# Patient Record
Sex: Female | Born: 1950 | Race: Black or African American | Hispanic: No | Smoking: Former smoker
Health system: Southern US, Community
[De-identification: ages and names within clinical notes are randomized; demographics above are authoritative.]

## PROBLEM LIST (undated history)

## (undated) DIAGNOSIS — D696 Thrombocytopenia, unspecified: Secondary | ICD-10-CM

## (undated) DIAGNOSIS — R011 Cardiac murmur, unspecified: Secondary | ICD-10-CM

## (undated) DIAGNOSIS — C189 Malignant neoplasm of colon, unspecified: Secondary | ICD-10-CM

## (undated) DIAGNOSIS — I313 Pericardial effusion (noninflammatory): Secondary | ICD-10-CM

## (undated) DIAGNOSIS — K219 Gastro-esophageal reflux disease without esophagitis: Secondary | ICD-10-CM

## (undated) DIAGNOSIS — Z923 Personal history of irradiation: Secondary | ICD-10-CM

## (undated) DIAGNOSIS — R51 Headache: Secondary | ICD-10-CM

## (undated) DIAGNOSIS — Z86010 Personal history of colonic polyps: Secondary | ICD-10-CM

## (undated) DIAGNOSIS — I3139 Other pericardial effusion (noninflammatory): Secondary | ICD-10-CM

## (undated) DIAGNOSIS — C719 Malignant neoplasm of brain, unspecified: Secondary | ICD-10-CM

## (undated) DIAGNOSIS — K5790 Diverticulosis of intestine, part unspecified, without perforation or abscess without bleeding: Secondary | ICD-10-CM

## (undated) DIAGNOSIS — Z9221 Personal history of antineoplastic chemotherapy: Secondary | ICD-10-CM

## (undated) DIAGNOSIS — Z5189 Encounter for other specified aftercare: Secondary | ICD-10-CM

## (undated) DIAGNOSIS — R0602 Shortness of breath: Secondary | ICD-10-CM

## (undated) DIAGNOSIS — G709 Myoneural disorder, unspecified: Secondary | ICD-10-CM

## (undated) DIAGNOSIS — IMO0001 Reserved for inherently not codable concepts without codable children: Secondary | ICD-10-CM

## (undated) DIAGNOSIS — I517 Cardiomegaly: Secondary | ICD-10-CM

## (undated) DIAGNOSIS — I739 Peripheral vascular disease, unspecified: Secondary | ICD-10-CM

## (undated) DIAGNOSIS — Z803 Family history of malignant neoplasm of breast: Secondary | ICD-10-CM

## (undated) DIAGNOSIS — I10 Essential (primary) hypertension: Secondary | ICD-10-CM

## (undated) DIAGNOSIS — M1990 Unspecified osteoarthritis, unspecified site: Secondary | ICD-10-CM

## (undated) DIAGNOSIS — T7840XA Allergy, unspecified, initial encounter: Secondary | ICD-10-CM

## (undated) DIAGNOSIS — Z800 Family history of malignant neoplasm of digestive organs: Secondary | ICD-10-CM

## (undated) HISTORY — DX: Diverticulosis of intestine, part unspecified, without perforation or abscess without bleeding: K57.90

## (undated) HISTORY — DX: Malignant neoplasm of brain, unspecified: C71.9

## (undated) HISTORY — DX: Allergy, unspecified, initial encounter: T78.40XA

## (undated) HISTORY — PX: TONSILLECTOMY: SUR1361

## (undated) HISTORY — DX: Family history of malignant neoplasm of breast: Z80.3

## (undated) HISTORY — DX: Myoneural disorder, unspecified: G70.9

## (undated) HISTORY — DX: Family history of malignant neoplasm of digestive organs: Z80.0

## (undated) HISTORY — PX: ROTATOR CUFF REPAIR: SHX139

## (undated) HISTORY — DX: Pericardial effusion (noninflammatory): I31.3

## (undated) HISTORY — DX: Other pericardial effusion (noninflammatory): I31.39

## (undated) HISTORY — PX: COLOSTOMY CLOSURE: SHX1381

## (undated) HISTORY — PX: PORTACATH PLACEMENT: SHX2246

## (undated) HISTORY — PX: COLON SURGERY: SHX602

## (undated) HISTORY — PX: TONSILLECTOMY: SHX5217

## (undated) HISTORY — PX: TUBAL LIGATION: SHX77

## (undated) HISTORY — PX: COLONOSCOPY: SHX174

## (undated) HISTORY — DX: Personal history of irradiation: Z92.3

## (undated) HISTORY — DX: Cardiomegaly: I51.7

## (undated) HISTORY — DX: Thrombocytopenia, unspecified: D69.6

---
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
## 2008-03-18 ENCOUNTER — Emergency Department (HOSPITAL_COMMUNITY): Admission: EM | Admit: 2008-03-18 | Discharge: 2008-03-18 | Payer: Self-pay | Admitting: Emergency Medicine

## 2008-04-27 ENCOUNTER — Encounter: Admission: RE | Admit: 2008-04-27 | Discharge: 2008-04-27 | Payer: Self-pay | Admitting: Orthopedic Surgery

## 2008-07-18 HISTORY — PX: COLON SURGERY: SHX602

## 2008-10-26 ENCOUNTER — Inpatient Hospital Stay (HOSPITAL_COMMUNITY): Admission: EM | Admit: 2008-10-26 | Discharge: 2008-11-04 | Payer: Self-pay | Admitting: Emergency Medicine

## 2008-10-26 ENCOUNTER — Ambulatory Visit: Payer: Self-pay | Admitting: Cardiology

## 2008-10-26 ENCOUNTER — Encounter: Payer: Self-pay | Admitting: Emergency Medicine

## 2008-10-27 ENCOUNTER — Encounter (INDEPENDENT_AMBULATORY_CARE_PROVIDER_SITE_OTHER): Payer: Self-pay | Admitting: General Surgery

## 2008-10-27 ENCOUNTER — Encounter: Payer: Self-pay | Admitting: Internal Medicine

## 2008-10-27 ENCOUNTER — Encounter: Payer: Self-pay | Admitting: Cardiology

## 2008-10-30 ENCOUNTER — Ambulatory Visit: Payer: Self-pay | Admitting: Oncology

## 2008-11-07 ENCOUNTER — Ambulatory Visit: Payer: Self-pay | Admitting: Oncology

## 2008-11-14 LAB — CBC WITH DIFFERENTIAL/PLATELET
BASO%: 1 % (ref 0.0–2.0)
Basophils Absolute: 0.1 10*3/uL (ref 0.0–0.1)
EOS%: 2.3 % (ref 0.0–7.0)
MCH: 23.3 pg — ABNORMAL LOW (ref 25.1–34.0)
MCV: 73.3 fL — ABNORMAL LOW (ref 79.5–101.0)
MONO%: 4.5 % (ref 0.0–14.0)
NEUT#: 8.2 10*3/uL — ABNORMAL HIGH (ref 1.5–6.5)
Platelets: 674 10*3/uL — ABNORMAL HIGH (ref 145–400)
RBC: 4.94 10*6/uL (ref 3.70–5.45)

## 2008-11-14 LAB — CEA: CEA: 5.3 ng/mL — ABNORMAL HIGH (ref 0.0–5.0)

## 2008-11-14 LAB — COMPREHENSIVE METABOLIC PANEL
AST: 15 U/L (ref 0–37)
Alkaline Phosphatase: 137 U/L — ABNORMAL HIGH (ref 39–117)
BUN: 16 mg/dL (ref 6–23)
Glucose, Bld: 93 mg/dL (ref 70–99)
Sodium: 142 mEq/L (ref 135–145)
Total Bilirubin: 0.4 mg/dL (ref 0.3–1.2)

## 2008-11-18 ENCOUNTER — Ambulatory Visit (HOSPITAL_COMMUNITY): Admission: RE | Admit: 2008-11-18 | Discharge: 2008-11-18 | Payer: Self-pay | Admitting: Oncology

## 2008-12-10 ENCOUNTER — Ambulatory Visit (HOSPITAL_COMMUNITY): Admission: RE | Admit: 2008-12-10 | Discharge: 2008-12-10 | Payer: Self-pay | Admitting: General Surgery

## 2008-12-10 LAB — CBC WITH DIFFERENTIAL/PLATELET
EOS%: 2.2 % (ref 0.0–7.0)
Eosinophils Absolute: 0.2 10*3/uL (ref 0.0–0.5)
MCV: 75.7 fL — ABNORMAL LOW (ref 79.5–101.0)
MONO%: 6.4 % (ref 0.0–14.0)
NEUT#: 7.9 10*3/uL — ABNORMAL HIGH (ref 1.5–6.5)
RBC: 4.82 10*6/uL (ref 3.70–5.45)
RDW: 24.4 % — ABNORMAL HIGH (ref 11.2–14.5)
lymph#: 2.1 10*3/uL (ref 0.9–3.3)
nRBC: 0 % (ref 0–0)

## 2008-12-10 LAB — COMPREHENSIVE METABOLIC PANEL
ALT: 35 U/L (ref 0–35)
AST: 28 U/L (ref 0–37)
Albumin: 3.5 g/dL (ref 3.5–5.2)
CO2: 25 mEq/L (ref 19–32)
Calcium: 8.7 mg/dL (ref 8.4–10.5)
Chloride: 104 mEq/L (ref 96–112)
Creatinine, Ser: 0.53 mg/dL (ref 0.40–1.20)
Potassium: 3.6 mEq/L (ref 3.5–5.3)
Sodium: 139 mEq/L (ref 135–145)
Total Protein: 6.6 g/dL (ref 6.0–8.3)

## 2008-12-10 LAB — LACTATE DEHYDROGENASE: LDH: 134 U/L (ref 94–250)

## 2008-12-17 LAB — CBC WITH DIFFERENTIAL/PLATELET
BASO%: 0.9 % (ref 0.0–2.0)
EOS%: 5.6 % (ref 0.0–7.0)
HCT: 35.7 % (ref 34.8–46.6)
HGB: 11.9 g/dL (ref 11.6–15.9)
MCH: 25.6 pg (ref 25.1–34.0)
MCHC: 33.2 g/dL (ref 31.5–36.0)
MONO#: 0.3 10*3/uL (ref 0.1–0.9)
NEUT%: 60.8 % (ref 38.4–76.8)
RDW: 26 % — ABNORMAL HIGH (ref 11.2–14.5)
WBC: 5.4 10*3/uL (ref 3.9–10.3)
lymph#: 1.5 10*3/uL (ref 0.9–3.3)

## 2008-12-19 ENCOUNTER — Ambulatory Visit: Payer: Self-pay | Admitting: Oncology

## 2008-12-23 LAB — CBC WITH DIFFERENTIAL/PLATELET
Basophils Absolute: 0.1 10*3/uL (ref 0.0–0.1)
Eosinophils Absolute: 0.3 10*3/uL (ref 0.0–0.5)
HGB: 11.3 g/dL — ABNORMAL LOW (ref 11.6–15.9)
LYMPH%: 25 % (ref 14.0–49.7)
MCH: 24.6 pg — ABNORMAL LOW (ref 25.1–34.0)
MCV: 76.9 fL — ABNORMAL LOW (ref 79.5–101.0)
MONO%: 10.3 % (ref 0.0–14.0)
NEUT#: 4.1 10*3/uL (ref 1.5–6.5)
Platelets: 220 10*3/uL (ref 145–400)

## 2008-12-23 LAB — COMPREHENSIVE METABOLIC PANEL
Alkaline Phosphatase: 94 U/L (ref 39–117)
CO2: 25 mEq/L (ref 19–32)
Chloride: 108 mEq/L (ref 96–112)
Potassium: 3.6 mEq/L (ref 3.5–5.3)
Sodium: 140 mEq/L (ref 135–145)

## 2009-01-04 ENCOUNTER — Encounter: Payer: Self-pay | Admitting: Internal Medicine

## 2009-01-05 LAB — CBC WITH DIFFERENTIAL/PLATELET
BASO%: 0.1 % (ref 0.0–2.0)
EOS%: 1.1 % (ref 0.0–7.0)
Eosinophils Absolute: 0.1 10*3/uL (ref 0.0–0.5)
LYMPH%: 23 % (ref 14.0–49.7)
MCH: 26.7 pg (ref 25.1–34.0)
MCHC: 33.9 g/dL (ref 31.5–36.0)
MCV: 78.7 fL — ABNORMAL LOW (ref 79.5–101.0)
MONO%: 10.8 % (ref 0.0–14.0)
Platelets: 294 10*3/uL (ref 145–400)
RBC: 4.72 10*6/uL (ref 3.70–5.45)

## 2009-01-08 LAB — COMPREHENSIVE METABOLIC PANEL
Alkaline Phosphatase: 118 U/L — ABNORMAL HIGH (ref 39–117)
Glucose, Bld: 91 mg/dL (ref 70–99)
Sodium: 143 mEq/L (ref 135–145)
Total Bilirubin: 0.7 mg/dL (ref 0.3–1.2)
Total Protein: 7.4 g/dL (ref 6.0–8.3)

## 2009-01-08 LAB — IRON AND TIBC
%SAT: 11 % — ABNORMAL LOW (ref 20–55)
Iron: 43 ug/dL (ref 42–145)

## 2009-01-08 LAB — FERRITIN: Ferritin: 43 ng/mL (ref 10–291)

## 2009-01-08 LAB — TRANSFERRIN RECEPTOR, SOLUABLE: Transferrin Receptor, Soluble: 37 nmol/L

## 2009-01-16 LAB — COMPREHENSIVE METABOLIC PANEL
ALT: 39 U/L — ABNORMAL HIGH (ref 0–35)
AST: 43 U/L — ABNORMAL HIGH (ref 0–37)
Albumin: 3.9 g/dL (ref 3.5–5.2)
Alkaline Phosphatase: 116 U/L (ref 39–117)
BUN: 17 mg/dL (ref 6–23)
Chloride: 108 mEq/L (ref 96–112)
Creatinine, Ser: 0.69 mg/dL (ref 0.40–1.20)
Potassium: 4.9 mEq/L (ref 3.5–5.3)

## 2009-01-16 LAB — CBC WITH DIFFERENTIAL/PLATELET
Basophils Absolute: 0 10*3/uL (ref 0.0–0.1)
Eosinophils Absolute: 0 10*3/uL (ref 0.0–0.5)
HCT: 35.8 % (ref 34.8–46.6)
HGB: 11.9 g/dL (ref 11.6–15.9)
LYMPH%: 25.9 % (ref 14.0–49.7)
MCV: 79.7 fL (ref 79.5–101.0)
MONO#: 0.9 10*3/uL (ref 0.1–0.9)
MONO%: 16.6 % — ABNORMAL HIGH (ref 0.0–14.0)
NEUT#: 3.1 10*3/uL (ref 1.5–6.5)
NEUT%: 55.9 % (ref 38.4–76.8)
Platelets: 244 10*3/uL (ref 145–400)
WBC: 5.5 10*3/uL (ref 3.9–10.3)

## 2009-01-20 ENCOUNTER — Ambulatory Visit: Payer: Self-pay | Admitting: Oncology

## 2009-01-20 LAB — CBC WITH DIFFERENTIAL/PLATELET
Basophils Absolute: 0 10*3/uL (ref 0.0–0.1)
EOS%: 1.7 % (ref 0.0–7.0)
Eosinophils Absolute: 0.1 10*3/uL (ref 0.0–0.5)
HGB: 11.7 g/dL (ref 11.6–15.9)
LYMPH%: 30.9 % (ref 14.0–49.7)
MCH: 26.7 pg (ref 25.1–34.0)
MCV: 80.6 fL (ref 79.5–101.0)
MONO%: 14.1 % — ABNORMAL HIGH (ref 0.0–14.0)
NEUT#: 2.4 10*3/uL (ref 1.5–6.5)
Platelets: 200 10*3/uL (ref 145–400)

## 2009-02-03 ENCOUNTER — Ambulatory Visit (HOSPITAL_COMMUNITY): Admission: RE | Admit: 2009-02-03 | Discharge: 2009-02-03 | Payer: Self-pay | Admitting: Oncology

## 2009-02-03 LAB — CBC WITH DIFFERENTIAL/PLATELET
BASO%: 0.5 % (ref 0.0–2.0)
EOS%: 1.5 % (ref 0.0–7.0)
MCH: 26.5 pg (ref 25.1–34.0)
MCHC: 33.6 g/dL (ref 31.5–36.0)
MONO#: 0.8 10*3/uL (ref 0.1–0.9)
RBC: 4.65 10*6/uL (ref 3.70–5.45)
RDW: 18.7 % — ABNORMAL HIGH (ref 11.2–14.5)
WBC: 6 10*3/uL (ref 3.9–10.3)
lymph#: 1.8 10*3/uL (ref 0.9–3.3)
nRBC: 0 % (ref 0–0)

## 2009-02-04 LAB — CEA: CEA: 2 ng/mL (ref 0.0–5.0)

## 2009-02-04 LAB — COMPREHENSIVE METABOLIC PANEL
AST: 49 U/L — ABNORMAL HIGH (ref 0–37)
Alkaline Phosphatase: 110 U/L (ref 39–117)
BUN: 19 mg/dL (ref 6–23)
Creatinine, Ser: 0.8 mg/dL (ref 0.40–1.20)
Glucose, Bld: 93 mg/dL (ref 70–99)
Total Bilirubin: 0.9 mg/dL (ref 0.3–1.2)

## 2009-02-09 ENCOUNTER — Encounter: Payer: Self-pay | Admitting: Internal Medicine

## 2009-02-10 LAB — CBC WITH DIFFERENTIAL/PLATELET
BASO%: 1.3 % (ref 0.0–2.0)
Eosinophils Absolute: 0.1 10*3/uL (ref 0.0–0.5)
LYMPH%: 51.7 % — ABNORMAL HIGH (ref 14.0–49.7)
MCHC: 33.7 g/dL (ref 31.5–36.0)
MONO#: 0.3 10*3/uL (ref 0.1–0.9)
MONO%: 9.4 % (ref 0.0–14.0)
NEUT#: 1.2 10*3/uL — ABNORMAL LOW (ref 1.5–6.5)
RBC: 4.76 10*6/uL (ref 3.70–5.45)
RDW: 17 % — ABNORMAL HIGH (ref 11.2–14.5)
WBC: 3.2 10*3/uL — ABNORMAL LOW (ref 3.9–10.3)
nRBC: 0 % (ref 0–0)

## 2009-02-17 LAB — CBC WITH DIFFERENTIAL/PLATELET
Basophils Absolute: 0 10*3/uL (ref 0.0–0.1)
HCT: 38 % (ref 34.8–46.6)
HGB: 12.8 g/dL (ref 11.6–15.9)
MONO#: 0.7 10*3/uL (ref 0.1–0.9)
NEUT#: 1.5 10*3/uL (ref 1.5–6.5)
NEUT%: 36.9 % — ABNORMAL LOW (ref 38.4–76.8)
WBC: 4.1 10*3/uL (ref 3.9–10.3)
lymph#: 1.8 10*3/uL (ref 0.9–3.3)

## 2009-02-20 ENCOUNTER — Ambulatory Visit: Payer: Self-pay | Admitting: Oncology

## 2009-02-24 LAB — CBC WITH DIFFERENTIAL/PLATELET
Basophils Absolute: 0.1 10*3/uL (ref 0.0–0.1)
Eosinophils Absolute: 0 10*3/uL (ref 0.0–0.5)
HGB: 13.9 g/dL (ref 11.6–15.9)
MCV: 84.1 fL (ref 79.5–101.0)
MONO#: 1 10*3/uL — ABNORMAL HIGH (ref 0.1–0.9)
MONO%: 24.6 % — ABNORMAL HIGH (ref 0.0–14.0)
NEUT#: 0.8 10*3/uL — ABNORMAL LOW (ref 1.5–6.5)
Platelets: 204 10*3/uL (ref 145–400)
RDW: 18.5 % — ABNORMAL HIGH (ref 11.2–14.5)

## 2009-03-03 LAB — CBC WITH DIFFERENTIAL/PLATELET
Basophils Absolute: 0.1 10*3/uL (ref 0.0–0.1)
Eosinophils Absolute: 0.1 10*3/uL (ref 0.0–0.5)
HCT: 41.9 % (ref 34.8–46.6)
HGB: 13.9 g/dL (ref 11.6–15.9)
LYMPH%: 26.2 % (ref 14.0–49.7)
MCHC: 33.2 g/dL (ref 31.5–36.0)
MONO#: 0.7 10*3/uL (ref 0.1–0.9)
NEUT#: 5 10*3/uL (ref 1.5–6.5)
NEUT%: 62.7 % (ref 38.4–76.8)
Platelets: 173 10*3/uL (ref 145–400)
WBC: 7.9 10*3/uL (ref 3.9–10.3)

## 2009-03-10 LAB — CBC WITH DIFFERENTIAL/PLATELET
BASO%: 0.5 % (ref 0.0–2.0)
Basophils Absolute: 0 10*3/uL (ref 0.0–0.1)
EOS%: 0.7 % (ref 0.0–7.0)
HCT: 39.6 % (ref 34.8–46.6)
HGB: 13.1 g/dL (ref 11.6–15.9)
LYMPH%: 29.1 % (ref 14.0–49.7)
MCH: 28 pg (ref 25.1–34.0)
MCHC: 33 g/dL (ref 31.5–36.0)
NEUT%: 65.9 % (ref 38.4–76.8)
Platelets: 126 10*3/uL — ABNORMAL LOW (ref 145–400)

## 2009-03-17 LAB — CBC WITH DIFFERENTIAL/PLATELET
Basophils Absolute: 0.1 10*3/uL (ref 0.0–0.1)
EOS%: 2.1 % (ref 0.0–7.0)
Eosinophils Absolute: 0.1 10*3/uL (ref 0.0–0.5)
HCT: 37.3 % (ref 34.8–46.6)
HGB: 12.5 g/dL (ref 11.6–15.9)
MCH: 27.5 pg (ref 25.1–34.0)
NEUT#: 2.4 10*3/uL (ref 1.5–6.5)
NEUT%: 46.5 % (ref 38.4–76.8)
RDW: 16.9 % — ABNORMAL HIGH (ref 11.2–14.5)
lymph#: 1.9 10*3/uL (ref 0.9–3.3)

## 2009-03-17 LAB — COMPREHENSIVE METABOLIC PANEL
ALT: 17 U/L (ref 0–35)
AST: 25 U/L (ref 0–37)
BUN: 17 mg/dL (ref 6–23)
CO2: 24 mEq/L (ref 19–32)
Calcium: 8.7 mg/dL (ref 8.4–10.5)
Creatinine, Ser: 0.53 mg/dL (ref 0.40–1.20)
Total Bilirubin: 0.7 mg/dL (ref 0.3–1.2)

## 2009-03-17 LAB — LACTATE DEHYDROGENASE: LDH: 143 U/L (ref 94–250)

## 2009-03-24 ENCOUNTER — Ambulatory Visit: Payer: Self-pay | Admitting: Oncology

## 2009-03-24 LAB — COMPREHENSIVE METABOLIC PANEL
ALT: 84 U/L — ABNORMAL HIGH (ref 0–35)
AST: 87 U/L — ABNORMAL HIGH (ref 0–37)
Albumin: 3.9 g/dL (ref 3.5–5.2)
Calcium: 9.4 mg/dL (ref 8.4–10.5)
Chloride: 107 mEq/L (ref 96–112)
Potassium: 4 mEq/L (ref 3.5–5.3)
Sodium: 140 mEq/L (ref 135–145)
Total Protein: 6.9 g/dL (ref 6.0–8.3)

## 2009-03-24 LAB — CBC WITH DIFFERENTIAL/PLATELET
BASO%: 0.9 % (ref 0.0–2.0)
EOS%: 1.4 % (ref 0.0–7.0)
MCH: 28.5 pg (ref 25.1–34.0)
MCHC: 33.7 g/dL (ref 31.5–36.0)
MONO#: 0.4 10*3/uL (ref 0.1–0.9)
RDW: 17.4 % — ABNORMAL HIGH (ref 11.2–14.5)
WBC: 3.1 10*3/uL — ABNORMAL LOW (ref 3.9–10.3)
lymph#: 1.5 10*3/uL (ref 0.9–3.3)

## 2009-03-31 LAB — CBC WITH DIFFERENTIAL/PLATELET
Basophils Absolute: 0 10*3/uL (ref 0.0–0.1)
EOS%: 1 % (ref 0.0–7.0)
HGB: 13.1 g/dL (ref 11.6–15.9)
MCH: 27.8 pg (ref 25.1–34.0)
MCHC: 33.8 g/dL (ref 31.5–36.0)
MCV: 82.4 fL (ref 79.5–101.0)
MONO%: 15 % — ABNORMAL HIGH (ref 0.0–14.0)
RDW: 17.5 % — ABNORMAL HIGH (ref 11.2–14.5)

## 2009-04-02 ENCOUNTER — Ambulatory Visit (HOSPITAL_COMMUNITY): Admission: RE | Admit: 2009-04-02 | Discharge: 2009-04-02 | Payer: Self-pay | Admitting: Oncology

## 2009-04-07 LAB — CBC WITH DIFFERENTIAL/PLATELET
BASO%: 1.1 % (ref 0.0–2.0)
LYMPH%: 45.6 % (ref 14.0–49.7)
MCHC: 33.7 g/dL (ref 31.5–36.0)
MCV: 84.3 fL (ref 79.5–101.0)
MONO#: 1 10*3/uL — ABNORMAL HIGH (ref 0.1–0.9)
MONO%: 21 % — ABNORMAL HIGH (ref 0.0–14.0)
Platelets: 130 10*3/uL — ABNORMAL LOW (ref 145–400)
RBC: 4.79 10*6/uL (ref 3.70–5.45)
WBC: 4.8 10*3/uL (ref 3.9–10.3)
nRBC: 0 % (ref 0–0)

## 2009-04-07 LAB — COMPREHENSIVE METABOLIC PANEL
Alkaline Phosphatase: 148 U/L — ABNORMAL HIGH (ref 39–117)
BUN: 16 mg/dL (ref 6–23)
Creatinine, Ser: 0.58 mg/dL (ref 0.40–1.20)
Glucose, Bld: 82 mg/dL (ref 70–99)
Sodium: 140 mEq/L (ref 135–145)
Total Bilirubin: 1.1 mg/dL (ref 0.3–1.2)

## 2009-04-07 LAB — CEA: CEA: 1.8 ng/mL (ref 0.0–5.0)

## 2009-04-21 LAB — CBC WITH DIFFERENTIAL/PLATELET
BASO%: 1.6 % (ref 0.0–2.0)
EOS%: 0.4 % (ref 0.0–7.0)
HCT: 37.2 % (ref 34.8–46.6)
LYMPH%: 14.2 % (ref 14.0–49.7)
MCH: 28.5 pg (ref 25.1–34.0)
MCHC: 33.1 g/dL (ref 31.5–36.0)
NEUT%: 78.6 % — ABNORMAL HIGH (ref 38.4–76.8)
Platelets: 64 10*3/uL — ABNORMAL LOW (ref 145–400)
lymph#: 2.9 10*3/uL (ref 0.9–3.3)

## 2009-04-21 LAB — LACTATE DEHYDROGENASE: LDH: 350 U/L — ABNORMAL HIGH (ref 94–250)

## 2009-04-21 LAB — COMPREHENSIVE METABOLIC PANEL
ALT: 33 U/L (ref 0–35)
CO2: 23 mEq/L (ref 19–32)
Chloride: 107 mEq/L (ref 96–112)
Potassium: 3.7 mEq/L (ref 3.5–5.3)
Sodium: 140 mEq/L (ref 135–145)
Total Bilirubin: 0.7 mg/dL (ref 0.3–1.2)
Total Protein: 6.8 g/dL (ref 6.0–8.3)

## 2009-04-27 ENCOUNTER — Ambulatory Visit: Admission: RE | Admit: 2009-04-27 | Discharge: 2009-06-23 | Payer: Self-pay | Admitting: Radiation Oncology

## 2009-05-05 ENCOUNTER — Ambulatory Visit: Payer: Self-pay | Admitting: Oncology

## 2009-05-05 LAB — CBC WITH DIFFERENTIAL/PLATELET
BASO%: 0.5 % (ref 0.0–2.0)
EOS%: 0.3 % (ref 0.0–7.0)
MCH: 28.5 pg (ref 25.1–34.0)
MCHC: 32.4 g/dL (ref 31.5–36.0)
RBC: 4.28 10*6/uL (ref 3.70–5.45)
RDW: 17 % — ABNORMAL HIGH (ref 11.2–14.5)
lymph#: 2.1 10*3/uL (ref 0.9–3.3)

## 2009-05-05 LAB — LACTATE DEHYDROGENASE: LDH: 193 U/L (ref 94–250)

## 2009-05-05 LAB — COMPREHENSIVE METABOLIC PANEL
ALT: 31 U/L (ref 0–35)
AST: 37 U/L (ref 0–37)
Albumin: 3.9 g/dL (ref 3.5–5.2)
Calcium: 9.3 mg/dL (ref 8.4–10.5)
Chloride: 107 mEq/L (ref 96–112)
Potassium: 4 mEq/L (ref 3.5–5.3)

## 2009-05-29 ENCOUNTER — Ambulatory Visit: Payer: Self-pay | Admitting: Oncology

## 2009-06-02 LAB — COMPREHENSIVE METABOLIC PANEL
ALT: 43 U/L — ABNORMAL HIGH (ref 0–35)
Alkaline Phosphatase: 153 U/L — ABNORMAL HIGH (ref 39–117)
Sodium: 141 mEq/L (ref 135–145)
Total Bilirubin: 1 mg/dL (ref 0.3–1.2)
Total Protein: 7.3 g/dL (ref 6.0–8.3)

## 2009-06-02 LAB — CBC WITH DIFFERENTIAL/PLATELET
BASO%: 0.8 % (ref 0.0–2.0)
Basophils Absolute: 0.1 10*3/uL (ref 0.0–0.1)
EOS%: 0.7 % (ref 0.0–7.0)
HCT: 40.1 % (ref 34.8–46.6)
HGB: 12.9 g/dL (ref 11.6–15.9)
LYMPH%: 25.3 % (ref 14.0–49.7)
MCH: 28.9 pg (ref 25.1–34.0)
MCHC: 32.2 g/dL (ref 31.5–36.0)
MONO#: 0.8 10*3/uL (ref 0.1–0.9)
NEUT%: 62.2 % (ref 38.4–76.8)
Platelets: 202 10*3/uL (ref 145–400)

## 2009-06-09 LAB — CBC WITH DIFFERENTIAL/PLATELET
Basophils Absolute: 0.2 10*3/uL — ABNORMAL HIGH (ref 0.0–0.1)
EOS%: 1 % (ref 0.0–7.0)
Eosinophils Absolute: 0.2 10*3/uL (ref 0.0–0.5)
HGB: 12.9 g/dL (ref 11.6–15.9)
LYMPH%: 11.1 % — ABNORMAL LOW (ref 14.0–49.7)
MCH: 30 pg (ref 25.1–34.0)
MCV: 89.3 fL (ref 79.5–101.0)
MONO%: 6.8 % (ref 0.0–14.0)
NEUT%: 80.3 % — ABNORMAL HIGH (ref 38.4–76.8)
Platelets: 153 10*3/uL (ref 145–400)
RDW: 16.5 % — ABNORMAL HIGH (ref 11.2–14.5)

## 2009-06-16 LAB — CBC WITH DIFFERENTIAL/PLATELET
BASO%: 0.7 % (ref 0.0–2.0)
EOS%: 0.9 % (ref 0.0–7.0)
LYMPH%: 16.3 % (ref 14.0–49.7)
MCH: 29.2 pg (ref 25.1–34.0)
MCHC: 33 g/dL (ref 31.5–36.0)
MCV: 88.5 fL (ref 79.5–101.0)
MONO%: 6.4 % (ref 0.0–14.0)
NEUT#: 13.1 10*3/uL — ABNORMAL HIGH (ref 1.5–6.5)
Platelets: 121 10*3/uL — ABNORMAL LOW (ref 145–400)
RBC: 4.52 10*6/uL (ref 3.70–5.45)
RDW: 16.7 % — ABNORMAL HIGH (ref 11.2–14.5)
nRBC: 0 % (ref 0–0)

## 2009-06-23 LAB — CEA: CEA: 1.1 ng/mL (ref 0.0–5.0)

## 2009-06-23 LAB — CBC WITH DIFFERENTIAL/PLATELET
BASO%: 0.4 % (ref 0.0–2.0)
EOS%: 0.8 % (ref 0.0–7.0)
LYMPH%: 23.7 % (ref 14.0–49.7)
MCH: 29.1 pg (ref 25.1–34.0)
MCHC: 32.7 g/dL (ref 31.5–36.0)
MONO#: 0.8 10*3/uL (ref 0.1–0.9)
NEUT%: 65.4 % (ref 38.4–76.8)
Platelets: 114 10*3/uL — ABNORMAL LOW (ref 145–400)
RBC: 4.26 10*6/uL (ref 3.70–5.45)
WBC: 8.3 10*3/uL (ref 3.9–10.3)
lymph#: 2 10*3/uL (ref 0.9–3.3)
nRBC: 0 % (ref 0–0)

## 2009-06-23 LAB — COMPREHENSIVE METABOLIC PANEL
ALT: 66 U/L — ABNORMAL HIGH (ref 0–35)
Albumin: 3.9 g/dL (ref 3.5–5.2)
CO2: 21 mEq/L (ref 19–32)
Glucose, Bld: 89 mg/dL (ref 70–99)
Potassium: 3.9 mEq/L (ref 3.5–5.3)
Sodium: 139 mEq/L (ref 135–145)
Total Protein: 6.7 g/dL (ref 6.0–8.3)

## 2009-06-23 LAB — LACTATE DEHYDROGENASE: LDH: 185 U/L (ref 94–250)

## 2009-06-23 IMAGING — CR DG CHEST 2V
2 series · 2 of 2 positions shown · non-contrast
Comparison: 10/26/2008

CLINICAL DATA: Preop for Port-A-Cath insertion.

CHEST - 2 VIEW

[view not recorded (1 of 2)]
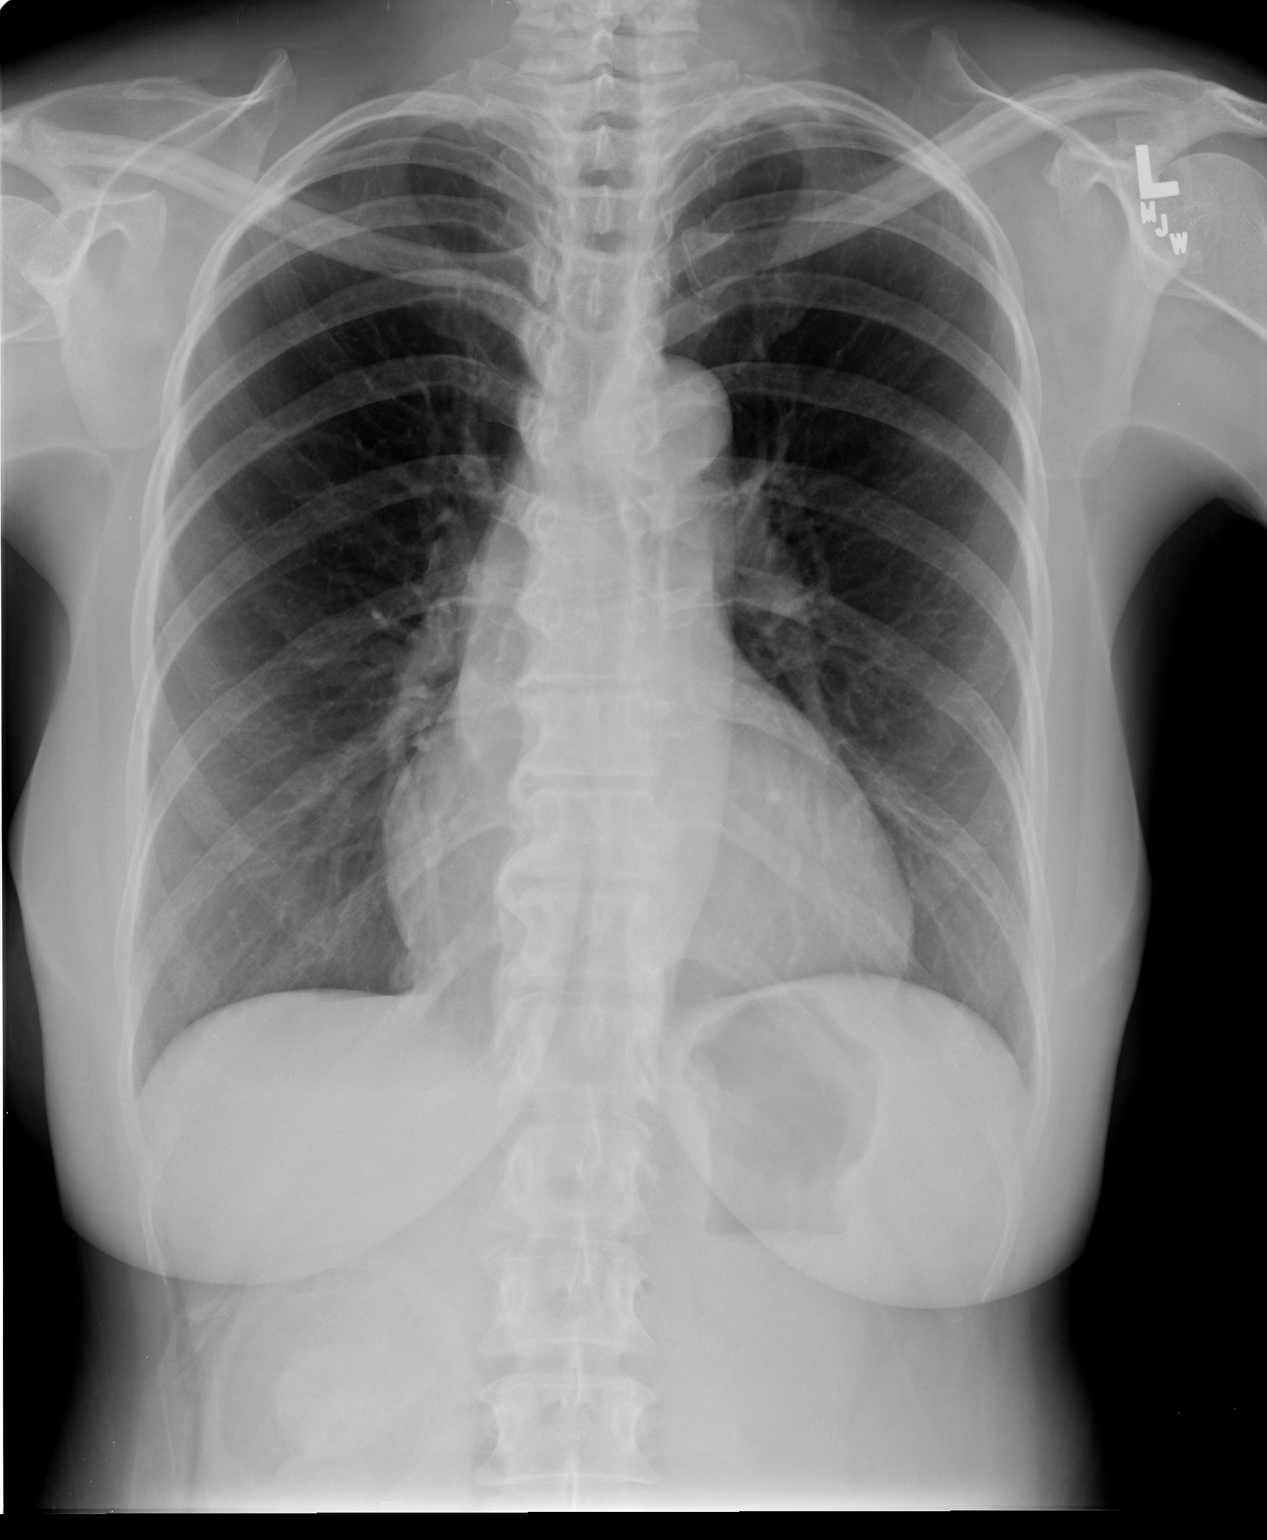

[view not recorded (2 of 2)]
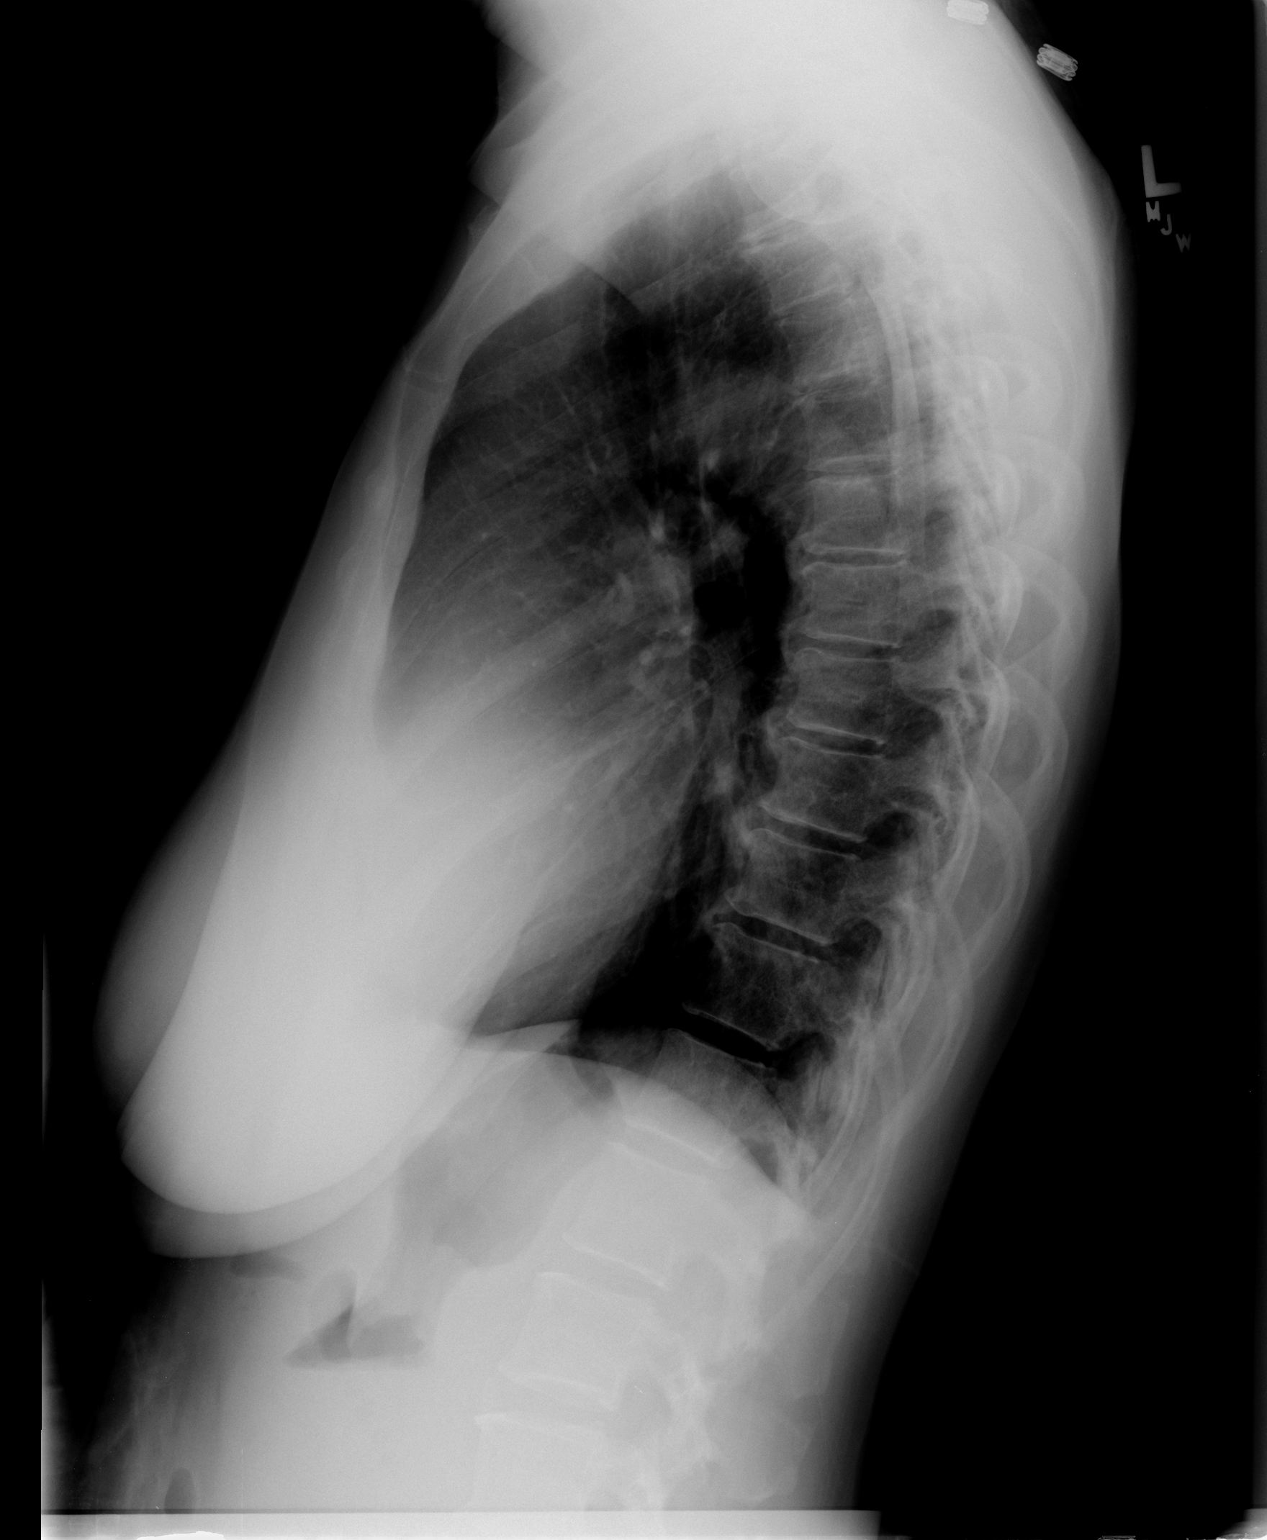

[2 of 2 positions shown; findings below may reference images not displayed]

FINDINGS: The cardiac silhouette, mediastinal and hilar contours
are stable.  The lungs are clear of an acute process.  Left upper
lobe and right lower lobe pulmonary nodules are seen.  Minimal left
apical scarring changes.  No pleural effusions.  The bony thorax is
intact and appears stable.
IMPRESSION: 1.  Left upper lobe and right lower lobe pulmonary nodules are
noted.
2.  No infiltrates, edema or effusions.
3.  Intact bony thorax with stable degenerative changes in the
thoracic spine.

## 2009-06-25 IMAGING — CR DG CHEST 1V PORT
1 series · 1 of 1 positions shown · non-contrast
Comparison: 12/08/2008

CLINICAL DATA: Port-A-Cath placement

PORTABLE CHEST - 1 VIEW

[view not recorded]
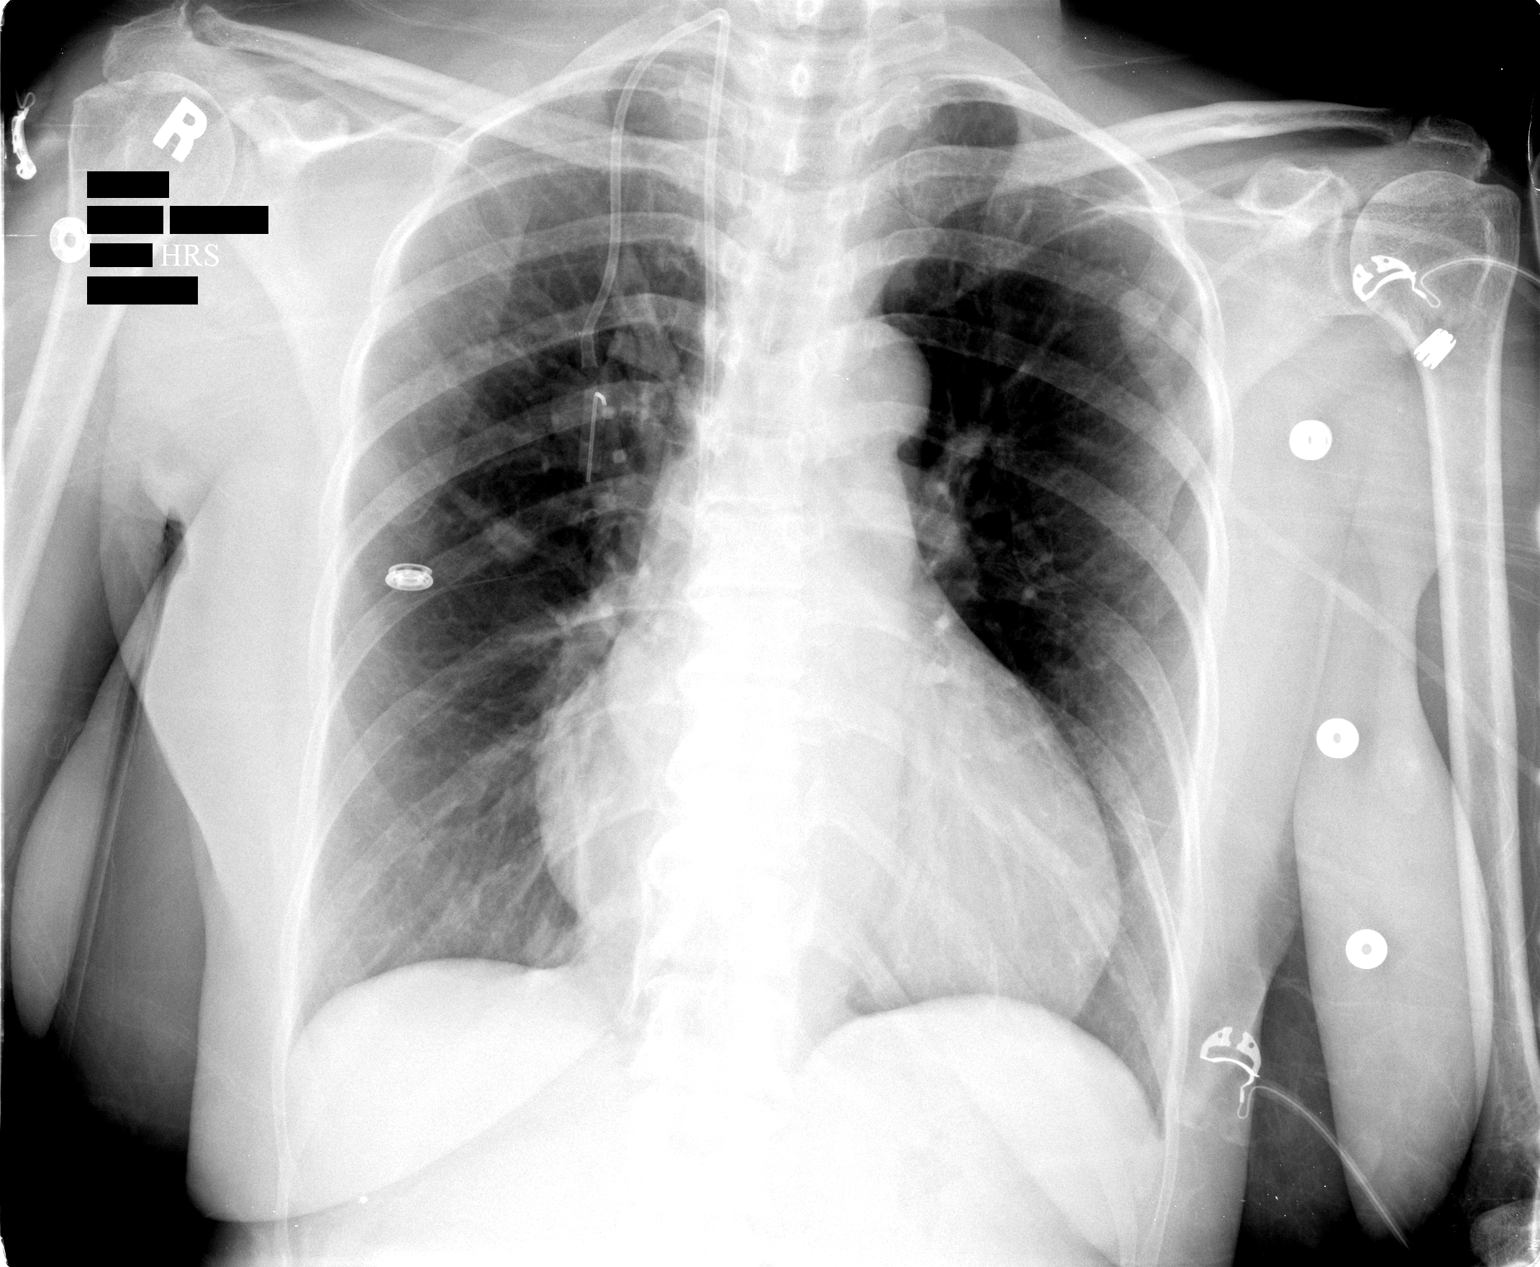

[1 of 1 positions shown; findings below may reference images not displayed]

FINDINGS: A right internal jugular Port-A-Cath has been placed.
The tip is in the lower SVC.  No pneumothorax.  Cardiomegaly.
Nodules are obscured.
IMPRESSION: Right internal jugular Port-A-Cath placement in the lower SVC
without pneumothorax.

## 2009-06-29 ENCOUNTER — Ambulatory Visit: Payer: Self-pay | Admitting: Oncology

## 2009-06-29 LAB — CBC WITH DIFFERENTIAL/PLATELET
BASO%: 0.3 % (ref 0.0–2.0)
Basophils Absolute: 0 10*3/uL (ref 0.0–0.1)
EOS%: 0.4 % (ref 0.0–7.0)
HGB: 12.5 g/dL (ref 11.6–15.9)
MCH: 29.8 pg (ref 25.1–34.0)
MCV: 88.3 fL (ref 79.5–101.0)
MONO%: 3.4 % (ref 0.0–14.0)
RBC: 4.19 10*6/uL (ref 3.70–5.45)
RDW: 15.7 % — ABNORMAL HIGH (ref 11.2–14.5)
lymph#: 1.7 10*3/uL (ref 0.9–3.3)

## 2009-07-07 LAB — CBC WITH DIFFERENTIAL/PLATELET
Basophils Absolute: 0.1 10*3/uL (ref 0.0–0.1)
Eosinophils Absolute: 0.1 10*3/uL (ref 0.0–0.5)
HGB: 12.9 g/dL (ref 11.6–15.9)
MCV: 89.3 fL (ref 79.5–101.0)
MONO#: 0.7 10*3/uL (ref 0.1–0.9)
MONO%: 13 % (ref 0.0–14.0)
NEUT#: 2.9 10*3/uL (ref 1.5–6.5)
RBC: 4.4 10*6/uL (ref 3.70–5.45)
RDW: 15.7 % — ABNORMAL HIGH (ref 11.2–14.5)
WBC: 5.5 10*3/uL (ref 3.9–10.3)
lymph#: 1.7 10*3/uL (ref 0.9–3.3)
nRBC: 0 % (ref 0–0)

## 2009-07-08 ENCOUNTER — Ambulatory Visit (HOSPITAL_COMMUNITY): Admission: RE | Admit: 2009-07-08 | Discharge: 2009-07-08 | Payer: Self-pay | Admitting: Oncology

## 2009-07-14 LAB — LACTATE DEHYDROGENASE: LDH: 183 U/L (ref 94–250)

## 2009-07-14 LAB — COMPREHENSIVE METABOLIC PANEL
AST: 57 U/L — ABNORMAL HIGH (ref 0–37)
Albumin: 3.8 g/dL (ref 3.5–5.2)
Alkaline Phosphatase: 161 U/L — ABNORMAL HIGH (ref 39–117)
Calcium: 9.1 mg/dL (ref 8.4–10.5)
Chloride: 105 mEq/L (ref 96–112)
Glucose, Bld: 79 mg/dL (ref 70–99)
Potassium: 4.2 mEq/L (ref 3.5–5.3)
Sodium: 140 mEq/L (ref 135–145)
Total Protein: 6.8 g/dL (ref 6.0–8.3)

## 2009-07-14 LAB — CBC WITH DIFFERENTIAL/PLATELET
BASO%: 1 % (ref 0.0–2.0)
Eosinophils Absolute: 0.2 10*3/uL (ref 0.0–0.5)
HCT: 42 % (ref 34.8–46.6)
HGB: 13.6 g/dL (ref 11.6–15.9)
LYMPH%: 23.1 % (ref 14.0–49.7)
MCHC: 32.4 g/dL (ref 31.5–36.0)
MONO#: 0.7 10*3/uL (ref 0.1–0.9)
NEUT#: 3.6 10*3/uL (ref 1.5–6.5)
NEUT%: 61.4 % (ref 38.4–76.8)
Platelets: 145 10*3/uL (ref 145–400)
WBC: 5.9 10*3/uL (ref 3.9–10.3)
lymph#: 1.4 10*3/uL (ref 0.9–3.3)

## 2009-07-21 ENCOUNTER — Ambulatory Visit: Payer: Self-pay | Admitting: Oncology

## 2009-07-22 ENCOUNTER — Ambulatory Visit (HOSPITAL_COMMUNITY): Admission: RE | Admit: 2009-07-22 | Discharge: 2009-07-22 | Payer: Self-pay | Admitting: Oncology

## 2009-07-24 ENCOUNTER — Ambulatory Visit (HOSPITAL_COMMUNITY): Admission: RE | Admit: 2009-07-24 | Discharge: 2009-07-24 | Payer: Self-pay | Admitting: Radiation Oncology

## 2009-07-27 ENCOUNTER — Ambulatory Visit: Admission: RE | Admit: 2009-07-27 | Discharge: 2009-08-06 | Payer: Self-pay | Admitting: Radiation Oncology

## 2009-07-28 LAB — CBC WITH DIFFERENTIAL/PLATELET
Eosinophils Absolute: 0.2 10*3/uL (ref 0.0–0.5)
HCT: 40.4 % (ref 34.8–46.6)
HGB: 13.3 g/dL (ref 11.6–15.9)
LYMPH%: 30.8 % (ref 14.0–49.7)
MONO#: 0.7 10*3/uL (ref 0.1–0.9)
NEUT#: 1.9 10*3/uL (ref 1.5–6.5)
Platelets: 143 10*3/uL — ABNORMAL LOW (ref 145–400)
RBC: 4.51 10*6/uL (ref 3.70–5.45)
WBC: 4.2 10*3/uL (ref 3.9–10.3)

## 2009-07-28 LAB — COMPREHENSIVE METABOLIC PANEL
ALT: 56 U/L — ABNORMAL HIGH (ref 0–35)
CO2: 24 mEq/L (ref 19–32)
Calcium: 8.8 mg/dL (ref 8.4–10.5)
Chloride: 107 mEq/L (ref 96–112)
Creatinine, Ser: 0.74 mg/dL (ref 0.40–1.20)
Total Protein: 6.6 g/dL (ref 6.0–8.3)

## 2009-07-28 LAB — LACTATE DEHYDROGENASE: LDH: 179 U/L (ref 94–250)

## 2009-08-11 ENCOUNTER — Encounter: Payer: Self-pay | Admitting: Cardiology

## 2009-08-11 LAB — COMPREHENSIVE METABOLIC PANEL
ALT: 48 U/L — ABNORMAL HIGH (ref 0–35)
CO2: 25 mEq/L (ref 19–32)
Calcium: 9.7 mg/dL (ref 8.4–10.5)
Chloride: 105 mEq/L (ref 96–112)
Potassium: 4.4 mEq/L (ref 3.5–5.3)
Sodium: 140 mEq/L (ref 135–145)
Total Protein: 7.2 g/dL (ref 6.0–8.3)

## 2009-08-11 LAB — CBC WITH DIFFERENTIAL/PLATELET
BASO%: 0.5 % (ref 0.0–2.0)
HCT: 42 % (ref 34.8–46.6)
MCHC: 33.9 g/dL (ref 31.5–36.0)
MONO#: 1 10*3/uL — ABNORMAL HIGH (ref 0.1–0.9)
RBC: 4.66 10*6/uL (ref 3.70–5.45)
RDW: 15.5 % — ABNORMAL HIGH (ref 11.2–14.5)
WBC: 5.6 10*3/uL (ref 3.9–10.3)
lymph#: 1.4 10*3/uL (ref 0.9–3.3)

## 2009-08-11 LAB — LACTATE DEHYDROGENASE: LDH: 173 U/L (ref 94–250)

## 2009-08-13 ENCOUNTER — Ambulatory Visit (HOSPITAL_COMMUNITY): Admission: RE | Admit: 2009-08-13 | Discharge: 2009-08-13 | Payer: Self-pay | Admitting: Oncology

## 2009-08-13 ENCOUNTER — Encounter (HOSPITAL_COMMUNITY): Payer: Self-pay | Admitting: Oncology

## 2009-08-21 ENCOUNTER — Encounter: Payer: Self-pay | Admitting: Cardiology

## 2009-08-21 ENCOUNTER — Ambulatory Visit: Payer: Self-pay | Admitting: Oncology

## 2009-08-21 LAB — CBC WITH DIFFERENTIAL/PLATELET
Basophils Absolute: 0 10*3/uL (ref 0.0–0.1)
EOS%: 3.4 % (ref 0.0–7.0)
Eosinophils Absolute: 0.2 10*3/uL (ref 0.0–0.5)
HCT: 41.4 % (ref 34.8–46.6)
HGB: 13.6 g/dL (ref 11.6–15.9)
MCH: 29.3 pg (ref 25.1–34.0)
MCV: 89.2 fL (ref 79.5–101.0)
MONO%: 13.1 % (ref 0.0–14.0)
NEUT#: 2 10*3/uL (ref 1.5–6.5)
NEUT%: 46.3 % (ref 38.4–76.8)
Platelets: 160 10*3/uL (ref 145–400)
RDW: 15.1 % — ABNORMAL HIGH (ref 11.2–14.5)

## 2009-08-21 LAB — COMPREHENSIVE METABOLIC PANEL
ALT: 60 U/L — ABNORMAL HIGH (ref 0–35)
CO2: 22 mEq/L (ref 19–32)
Calcium: 9.2 mg/dL (ref 8.4–10.5)
Chloride: 106 mEq/L (ref 96–112)
Creatinine, Ser: 0.74 mg/dL (ref 0.40–1.20)
Glucose, Bld: 117 mg/dL — ABNORMAL HIGH (ref 70–99)
Sodium: 140 mEq/L (ref 135–145)
Total Protein: 6.7 g/dL (ref 6.0–8.3)

## 2009-08-21 LAB — LACTATE DEHYDROGENASE: LDH: 170 U/L (ref 94–250)

## 2009-08-25 LAB — CBC WITH DIFFERENTIAL/PLATELET
BASO%: 0.9 % (ref 0.0–2.0)
EOS%: 3.6 % (ref 0.0–7.0)
MCH: 29.4 pg (ref 25.1–34.0)
MCHC: 33.1 g/dL (ref 31.5–36.0)
MONO#: 0.8 10*3/uL (ref 0.1–0.9)
RDW: 15.3 % — ABNORMAL HIGH (ref 11.2–14.5)
WBC: 5.8 10*3/uL (ref 3.9–10.3)
lymph#: 1.5 10*3/uL (ref 0.9–3.3)
nRBC: 0 % (ref 0–0)

## 2009-08-26 LAB — ANA: Anti Nuclear Antibody(ANA): NEGATIVE

## 2009-08-31 DIAGNOSIS — I313 Pericardial effusion (noninflammatory): Secondary | ICD-10-CM | POA: Insufficient documentation

## 2009-09-01 ENCOUNTER — Encounter: Payer: Self-pay | Admitting: Cardiology

## 2009-09-01 DIAGNOSIS — I517 Cardiomegaly: Secondary | ICD-10-CM | POA: Insufficient documentation

## 2009-09-02 ENCOUNTER — Ambulatory Visit: Payer: Self-pay | Admitting: Cardiology

## 2009-09-02 DIAGNOSIS — R9431 Abnormal electrocardiogram [ECG] [EKG]: Secondary | ICD-10-CM | POA: Insufficient documentation

## 2009-09-09 ENCOUNTER — Encounter: Payer: Self-pay | Admitting: Cardiology

## 2009-09-09 LAB — COMPREHENSIVE METABOLIC PANEL
ALT: 33 U/L (ref 0–35)
AST: 30 U/L (ref 0–37)
Albumin: 3.8 g/dL (ref 3.5–5.2)
Alkaline Phosphatase: 127 U/L — ABNORMAL HIGH (ref 39–117)
Potassium: 4.1 mEq/L (ref 3.5–5.3)
Sodium: 138 mEq/L (ref 135–145)
Total Protein: 6.9 g/dL (ref 6.0–8.3)

## 2009-09-09 LAB — CBC WITH DIFFERENTIAL/PLATELET
Eosinophils Absolute: 0.2 10*3/uL (ref 0.0–0.5)
LYMPH%: 31.1 % (ref 14.0–49.7)
MCH: 29.2 pg (ref 25.1–34.0)
MCHC: 32.9 g/dL (ref 31.5–36.0)
MCV: 88.9 fL (ref 79.5–101.0)
MONO%: 17.5 % — ABNORMAL HIGH (ref 0.0–14.0)
NEUT#: 2.6 10*3/uL (ref 1.5–6.5)
Platelets: 130 10*3/uL — ABNORMAL LOW (ref 145–400)
RBC: 4.76 10*6/uL (ref 3.70–5.45)

## 2009-09-09 LAB — UA PROTEIN, DIPSTICK - CHCC: Protein, Urine: NEGATIVE mg/dL

## 2009-09-22 ENCOUNTER — Ambulatory Visit: Payer: Self-pay | Admitting: Oncology

## 2009-09-22 ENCOUNTER — Encounter: Payer: Self-pay | Admitting: Cardiology

## 2009-09-22 LAB — CBC WITH DIFFERENTIAL/PLATELET
Basophils Absolute: 0.1 10*3/uL (ref 0.0–0.1)
EOS%: 2.7 % (ref 0.0–7.0)
Eosinophils Absolute: 0.2 10*3/uL (ref 0.0–0.5)
HGB: 14 g/dL (ref 11.6–15.9)
LYMPH%: 30.4 % (ref 14.0–49.7)
MCH: 29.5 pg (ref 25.1–34.0)
MCV: 89.5 fL (ref 79.5–101.0)
MONO%: 13.9 % (ref 0.0–14.0)
Platelets: 165 10*3/uL (ref 145–400)
RBC: 4.74 10*6/uL (ref 3.70–5.45)
RDW: 16.2 % — ABNORMAL HIGH (ref 11.2–14.5)

## 2009-09-22 LAB — COMPREHENSIVE METABOLIC PANEL
ALT: 24 U/L (ref 0–35)
AST: 24 U/L (ref 0–37)
Albumin: 3.8 g/dL (ref 3.5–5.2)
Alkaline Phosphatase: 110 U/L (ref 39–117)
Calcium: 9.2 mg/dL (ref 8.4–10.5)
Chloride: 108 mEq/L (ref 96–112)
Potassium: 4.1 mEq/L (ref 3.5–5.3)

## 2009-10-06 ENCOUNTER — Ambulatory Visit (HOSPITAL_COMMUNITY): Admission: RE | Admit: 2009-10-06 | Discharge: 2009-10-06 | Payer: Self-pay | Admitting: Oncology

## 2009-10-06 ENCOUNTER — Encounter: Payer: Self-pay | Admitting: Cardiology

## 2009-10-06 LAB — CBC WITH DIFFERENTIAL/PLATELET
BASO%: 0.4 % (ref 0.0–2.0)
EOS%: 2 % (ref 0.0–7.0)
HCT: 41 % (ref 34.8–46.6)
LYMPH%: 29.9 % (ref 14.0–49.7)
MCH: 29.6 pg (ref 25.1–34.0)
MCHC: 33.2 g/dL (ref 31.5–36.0)
MONO#: 1 10*3/uL — ABNORMAL HIGH (ref 0.1–0.9)
MONO%: 14.3 % — ABNORMAL HIGH (ref 0.0–14.0)
NEUT%: 53.4 % (ref 38.4–76.8)
Platelets: 171 10*3/uL (ref 145–400)
RBC: 4.6 10*6/uL (ref 3.70–5.45)
WBC: 6.9 10*3/uL (ref 3.9–10.3)

## 2009-10-06 LAB — COMPREHENSIVE METABOLIC PANEL
ALT: 24 U/L (ref 0–35)
AST: 28 U/L (ref 0–37)
Alkaline Phosphatase: 117 U/L (ref 39–117)
BUN: 10 mg/dL (ref 6–23)
Calcium: 9.3 mg/dL (ref 8.4–10.5)
Chloride: 105 mEq/L (ref 96–112)
Creatinine, Ser: 0.75 mg/dL (ref 0.40–1.20)
Total Bilirubin: 1.3 mg/dL — ABNORMAL HIGH (ref 0.3–1.2)

## 2009-10-06 LAB — UA PROTEIN, DIPSTICK - CHCC: Protein, Urine: <30 mg/dL

## 2009-10-20 ENCOUNTER — Ambulatory Visit: Payer: Self-pay | Admitting: Oncology

## 2009-10-20 LAB — COMPREHENSIVE METABOLIC PANEL
Albumin: 3.8 g/dL (ref 3.5–5.2)
Alkaline Phosphatase: 136 U/L — ABNORMAL HIGH (ref 39–117)
BUN: 14 mg/dL (ref 6–23)
Glucose, Bld: 125 mg/dL — ABNORMAL HIGH (ref 70–99)
Total Bilirubin: 0.9 mg/dL (ref 0.3–1.2)

## 2009-10-20 LAB — CBC WITH DIFFERENTIAL/PLATELET
Basophils Absolute: 0.1 10*3/uL (ref 0.0–0.1)
EOS%: 4 % (ref 0.0–7.0)
HCT: 40.8 % (ref 34.8–46.6)
HGB: 13.3 g/dL (ref 11.6–15.9)
LYMPH%: 32.4 % (ref 14.0–49.7)
MCH: 29.6 pg (ref 25.1–34.0)
MCV: 90.7 fL (ref 79.5–101.0)
MONO%: 10.9 % (ref 0.0–14.0)
NEUT%: 51.8 % (ref 38.4–76.8)

## 2009-10-20 LAB — CEA: CEA: 2.9 ng/mL (ref 0.0–5.0)

## 2009-11-04 ENCOUNTER — Encounter: Payer: Self-pay | Admitting: Cardiology

## 2009-11-04 LAB — COMPREHENSIVE METABOLIC PANEL
ALT: 25 U/L (ref 0–35)
AST: 30 U/L (ref 0–37)
Calcium: 9.5 mg/dL (ref 8.4–10.5)
Chloride: 107 mEq/L (ref 96–112)
Creatinine, Ser: 0.72 mg/dL (ref 0.40–1.20)
Potassium: 3.9 mEq/L (ref 3.5–5.3)

## 2009-11-04 LAB — CBC WITH DIFFERENTIAL/PLATELET
BASO%: 1 % (ref 0.0–2.0)
EOS%: 2.9 % (ref 0.0–7.0)
HCT: 39.1 % (ref 34.8–46.6)
LYMPH%: 38.7 % (ref 14.0–49.7)
MCH: 29.9 pg (ref 25.1–34.0)
MCHC: 33.2 g/dL (ref 31.5–36.0)
MCV: 89.9 fL (ref 79.5–101.0)
MONO%: 14.4 % — ABNORMAL HIGH (ref 0.0–14.0)
NEUT%: 43 % (ref 38.4–76.8)
lymph#: 2.4 10*3/uL (ref 0.9–3.3)

## 2009-11-04 LAB — LACTATE DEHYDROGENASE: LDH: 157 U/L (ref 94–250)

## 2009-11-10 LAB — CBC WITH DIFFERENTIAL/PLATELET
BASO%: 0.4 % (ref 0.0–2.0)
EOS%: 3 % (ref 0.0–7.0)
HGB: 13.1 g/dL (ref 11.6–15.9)
MCH: 30.9 pg (ref 25.1–34.0)
MCHC: 34 g/dL (ref 31.5–36.0)
MONO%: 6.4 % (ref 0.0–14.0)
RBC: 4.24 10*6/uL (ref 3.70–5.45)
RDW: 18.1 % — ABNORMAL HIGH (ref 11.2–14.5)
lymph#: 1.9 10*3/uL (ref 0.9–3.3)

## 2009-11-16 ENCOUNTER — Ambulatory Visit (HOSPITAL_COMMUNITY): Admission: RE | Admit: 2009-11-16 | Discharge: 2009-11-16 | Payer: Self-pay | Admitting: Radiation Oncology

## 2009-11-17 ENCOUNTER — Encounter: Payer: Self-pay | Admitting: Cardiology

## 2009-11-17 LAB — CBC WITH DIFFERENTIAL/PLATELET
BASO%: 0.4 % (ref 0.0–2.0)
EOS%: 1.8 % (ref 0.0–7.0)
MCH: 30.5 pg (ref 25.1–34.0)
MCHC: 33.3 g/dL (ref 31.5–36.0)
MONO#: 0.9 10*3/uL (ref 0.1–0.9)
NEUT%: 56.2 % (ref 38.4–76.8)
RBC: 4.32 10*6/uL (ref 3.70–5.45)
RDW: 18.4 % — ABNORMAL HIGH (ref 11.2–14.5)
WBC: 6.5 10*3/uL (ref 3.9–10.3)
lymph#: 1.8 10*3/uL (ref 0.9–3.3)

## 2009-11-17 LAB — COMPREHENSIVE METABOLIC PANEL
ALT: 28 U/L (ref 0–35)
AST: 28 U/L (ref 0–37)
Calcium: 10.1 mg/dL (ref 8.4–10.5)
Chloride: 103 mEq/L (ref 96–112)
Creatinine, Ser: 0.83 mg/dL (ref 0.40–1.20)
Potassium: 3.7 mEq/L (ref 3.5–5.3)
Sodium: 139 mEq/L (ref 135–145)
Total Protein: 6.9 g/dL (ref 6.0–8.3)

## 2009-11-19 ENCOUNTER — Ambulatory Visit: Payer: Self-pay | Admitting: Oncology

## 2009-11-20 ENCOUNTER — Ambulatory Visit: Admission: RE | Admit: 2009-11-20 | Discharge: 2009-12-28 | Payer: Self-pay | Admitting: Radiation Oncology

## 2009-11-30 ENCOUNTER — Emergency Department (HOSPITAL_COMMUNITY): Admission: EM | Admit: 2009-11-30 | Discharge: 2009-11-30 | Payer: Self-pay | Admitting: Emergency Medicine

## 2009-12-01 ENCOUNTER — Encounter: Payer: Self-pay | Admitting: Cardiology

## 2009-12-01 LAB — CBC WITH DIFFERENTIAL/PLATELET
Basophils Absolute: 0 10*3/uL (ref 0.0–0.1)
Eosinophils Absolute: 0.2 10*3/uL (ref 0.0–0.5)
LYMPH%: 24.2 % (ref 14.0–49.7)
MCH: 30.2 pg (ref 25.1–34.0)
MCV: 91.1 fL (ref 79.5–101.0)
MONO%: 13.4 % (ref 0.0–14.0)
NEUT#: 5.6 10*3/uL (ref 1.5–6.5)
Platelets: 157 10*3/uL (ref 145–400)
RBC: 4.37 10*6/uL (ref 3.70–5.45)

## 2010-01-20 ENCOUNTER — Ambulatory Visit (HOSPITAL_COMMUNITY): Admission: RE | Admit: 2010-01-20 | Discharge: 2010-01-20 | Payer: Self-pay | Admitting: Oncology

## 2010-02-02 ENCOUNTER — Ambulatory Visit: Payer: Self-pay | Admitting: Oncology

## 2010-02-04 ENCOUNTER — Encounter: Payer: Self-pay | Admitting: Cardiology

## 2010-02-04 ENCOUNTER — Ambulatory Visit (HOSPITAL_COMMUNITY): Admission: RE | Admit: 2010-02-04 | Discharge: 2010-02-04 | Payer: Self-pay | Admitting: Oncology

## 2010-02-04 LAB — CBC WITH DIFFERENTIAL/PLATELET
Basophils Absolute: 0 10*3/uL (ref 0.0–0.1)
Eosinophils Absolute: 0.2 10*3/uL (ref 0.0–0.5)
HGB: 13.2 g/dL (ref 11.6–15.9)
MCV: 89.8 fL (ref 79.5–101.0)
MONO#: 0.6 10*3/uL (ref 0.1–0.9)
NEUT#: 4 10*3/uL (ref 1.5–6.5)
Platelets: 219 10*3/uL (ref 145–400)
RBC: 4.41 10*6/uL (ref 3.70–5.45)
RDW: 15.1 % — ABNORMAL HIGH (ref 11.2–14.5)
WBC: 6.7 10*3/uL (ref 3.9–10.3)

## 2010-02-04 LAB — COMPREHENSIVE METABOLIC PANEL
Albumin: 4.2 g/dL (ref 3.5–5.2)
BUN: 17 mg/dL (ref 6–23)
CO2: 24 mEq/L (ref 19–32)
Calcium: 10 mg/dL (ref 8.4–10.5)
Glucose, Bld: 97 mg/dL (ref 70–99)
Potassium: 3.8 mEq/L (ref 3.5–5.3)
Sodium: 140 mEq/L (ref 135–145)
Total Protein: 7 g/dL (ref 6.0–8.3)

## 2010-02-04 LAB — CEA: CEA: 2.1 ng/mL (ref 0.0–5.0)

## 2010-02-04 LAB — LACTATE DEHYDROGENASE: LDH: 157 U/L (ref 94–250)

## 2010-02-25 ENCOUNTER — Ambulatory Visit: Payer: Self-pay | Admitting: Cardiology

## 2010-03-29 ENCOUNTER — Ambulatory Visit: Payer: Self-pay | Admitting: Oncology

## 2010-04-01 ENCOUNTER — Encounter: Payer: Self-pay | Admitting: Cardiology

## 2010-04-01 LAB — COMPREHENSIVE METABOLIC PANEL
ALT: 31 U/L (ref 0–35)
AST: 35 U/L (ref 0–37)
BUN: 14 mg/dL (ref 6–23)
Calcium: 9.6 mg/dL (ref 8.4–10.5)
Chloride: 106 mEq/L (ref 96–112)
Creatinine, Ser: 0.73 mg/dL (ref 0.40–1.20)
Total Bilirubin: 0.7 mg/dL (ref 0.3–1.2)

## 2010-04-01 LAB — CBC WITH DIFFERENTIAL/PLATELET
BASO%: 0.3 % (ref 0.0–2.0)
Basophils Absolute: 0 10*3/uL (ref 0.0–0.1)
EOS%: 3.1 % (ref 0.0–7.0)
HCT: 39 % (ref 34.8–46.6)
HGB: 13 g/dL (ref 11.6–15.9)
LYMPH%: 24 % (ref 14.0–49.7)
MCH: 28.6 pg (ref 25.1–34.0)
MCHC: 33.3 g/dL (ref 31.5–36.0)
MCV: 85.9 fL (ref 79.5–101.0)
MONO%: 10 % (ref 0.0–14.0)
NEUT%: 62.6 % (ref 38.4–76.8)
Platelets: 187 10*3/uL (ref 145–400)

## 2010-04-09 ENCOUNTER — Ambulatory Visit (HOSPITAL_COMMUNITY): Admission: RE | Admit: 2010-04-09 | Discharge: 2010-04-09 | Payer: Self-pay | Admitting: Oncology

## 2010-04-15 ENCOUNTER — Encounter: Payer: Self-pay | Admitting: Cardiology

## 2010-06-04 ENCOUNTER — Ambulatory Visit: Payer: Self-pay | Admitting: Oncology

## 2010-06-08 ENCOUNTER — Encounter: Payer: Self-pay | Admitting: Cardiology

## 2010-06-08 LAB — CBC WITH DIFFERENTIAL/PLATELET
BASO%: 0.3 % (ref 0.0–2.0)
EOS%: 2.3 % (ref 0.0–7.0)
HCT: 38.9 % (ref 34.8–46.6)
LYMPH%: 23.7 % (ref 14.0–49.7)
MCH: 29 pg (ref 25.1–34.0)
MCHC: 34.1 g/dL (ref 31.5–36.0)
NEUT%: 69 % (ref 38.4–76.8)
Platelets: 220 10*3/uL (ref 145–400)
lymph#: 1.8 10*3/uL (ref 0.9–3.3)

## 2010-06-08 LAB — COMPREHENSIVE METABOLIC PANEL
Albumin: 4.2 g/dL (ref 3.5–5.2)
Alkaline Phosphatase: 124 U/L — ABNORMAL HIGH (ref 39–117)
BUN: 22 mg/dL (ref 6–23)
Glucose, Bld: 143 mg/dL — ABNORMAL HIGH (ref 70–99)
Potassium: 4 mEq/L (ref 3.5–5.3)
Total Bilirubin: 0.8 mg/dL (ref 0.3–1.2)

## 2010-06-08 LAB — LACTATE DEHYDROGENASE: LDH: 149 U/L (ref 94–250)

## 2010-06-08 LAB — CEA: CEA: 1.2 ng/mL (ref 0.0–5.0)

## 2010-06-13 ENCOUNTER — Encounter: Payer: Self-pay | Admitting: Internal Medicine

## 2010-06-16 ENCOUNTER — Ambulatory Visit (HOSPITAL_COMMUNITY): Admission: RE | Admit: 2010-06-16 | Payer: Self-pay | Admitting: Oncology

## 2010-06-21 ENCOUNTER — Encounter: Payer: Self-pay | Admitting: Internal Medicine

## 2010-06-25 ENCOUNTER — Telehealth (INDEPENDENT_AMBULATORY_CARE_PROVIDER_SITE_OTHER): Payer: Self-pay

## 2010-06-29 ENCOUNTER — Encounter: Payer: Self-pay | Admitting: Internal Medicine

## 2010-07-14 ENCOUNTER — Encounter (INDEPENDENT_AMBULATORY_CARE_PROVIDER_SITE_OTHER): Payer: Self-pay | Admitting: *Deleted

## 2010-07-15 ENCOUNTER — Ambulatory Visit: Payer: Self-pay | Admitting: Internal Medicine

## 2010-07-22 ENCOUNTER — Ambulatory Visit
Admission: RE | Admit: 2010-07-22 | Discharge: 2010-07-22 | Payer: Self-pay | Source: Home / Self Care | Attending: Internal Medicine | Admitting: Internal Medicine

## 2010-07-22 ENCOUNTER — Encounter: Payer: Self-pay | Admitting: Internal Medicine

## 2010-07-22 DIAGNOSIS — K5790 Diverticulosis of intestine, part unspecified, without perforation or abscess without bleeding: Secondary | ICD-10-CM

## 2010-07-22 HISTORY — DX: Diverticulosis of intestine, part unspecified, without perforation or abscess without bleeding: K57.90

## 2010-07-22 HISTORY — PX: COLONOSCOPY: SHX174

## 2010-07-28 ENCOUNTER — Ambulatory Visit (HOSPITAL_COMMUNITY)
Admission: RE | Admit: 2010-07-28 | Discharge: 2010-07-28 | Payer: Self-pay | Source: Home / Self Care | Attending: Oncology | Admitting: Oncology

## 2010-08-06 ENCOUNTER — Ambulatory Visit: Payer: Self-pay | Admitting: Oncology

## 2010-08-07 ENCOUNTER — Encounter (HOSPITAL_COMMUNITY): Payer: Self-pay | Admitting: Oncology

## 2010-08-08 ENCOUNTER — Encounter (HOSPITAL_COMMUNITY): Payer: Self-pay | Admitting: Oncology

## 2010-08-08 ENCOUNTER — Encounter: Payer: Self-pay | Admitting: Radiation Oncology

## 2010-08-10 ENCOUNTER — Encounter: Payer: Self-pay | Admitting: Cardiology

## 2010-08-10 ENCOUNTER — Other Ambulatory Visit (HOSPITAL_COMMUNITY): Payer: Self-pay | Admitting: Oncology

## 2010-08-10 ENCOUNTER — Ambulatory Visit (HOSPITAL_COMMUNITY)
Admission: RE | Admit: 2010-08-10 | Discharge: 2010-08-10 | Payer: Self-pay | Source: Home / Self Care | Attending: Oncology | Admitting: Oncology

## 2010-08-10 DIAGNOSIS — C189 Malignant neoplasm of colon, unspecified: Secondary | ICD-10-CM

## 2010-08-10 DIAGNOSIS — R918 Other nonspecific abnormal finding of lung field: Secondary | ICD-10-CM

## 2010-08-10 LAB — CBC WITH DIFFERENTIAL/PLATELET
BASO%: 0.2 % (ref 0.0–2.0)
EOS%: 1.8 % (ref 0.0–7.0)
MONO#: 0.4 10*3/uL (ref 0.1–0.9)
NEUT%: 71.2 % (ref 38.4–76.8)
RBC: 4.96 10*6/uL (ref 3.70–5.45)

## 2010-08-10 LAB — COMPREHENSIVE METABOLIC PANEL
BUN: 14 mg/dL (ref 6–23)
Calcium: 9.8 mg/dL (ref 8.4–10.5)
Chloride: 104 mEq/L (ref 96–112)
Creatinine, Ser: 0.87 mg/dL (ref 0.40–1.20)
Glucose, Bld: 149 mg/dL — ABNORMAL HIGH (ref 70–99)
Potassium: 3.8 mEq/L (ref 3.5–5.3)
Sodium: 141 mEq/L (ref 135–145)
Total Bilirubin: 0.7 mg/dL (ref 0.3–1.2)

## 2010-08-10 LAB — CEA: CEA: 1.4 ng/mL (ref 0.0–5.0)

## 2010-08-19 NOTE — Letter (Signed)
[facility]   Imported By: Marylou Mccoy 01/05/2010 11:16:55  _____________________________________________________________________  External Attachment:    Type:   Image     Comment:   External Document

## 2010-08-19 NOTE — Progress Notes (Signed)
Summary: Appt sooner than next avail  Phone Note From Other Clinic   Caller: Bernie @ [facility] [phone] Call For: Doc of the Day Reason for Call: Schedule Patient Appt Summary of Call: Wants to schedule pt for a pre-op Colon fairly quickly - but she told me pt has a cholestomy bag. I adviced her pt needs office visit but nothing available until next yr. Wants sooner. Initial call taken by: Leanor Kail [facility],  June 25, 2010 3:23 PM  Follow-up for Phone Call        Patient with colon CA, can't find hx of GI was seen at [facility] and CA diagnosed on CT.  Dr Abbey Chatters is requesting GI appointment for colonostomy through rectal remnant and transvere colostomy.  Patient  had a left colectomy and transverse colostomy in 2010.  They are sending records, they have no hx of a GI MD. I have given them an appointment for 07/22/10 for consult.  Please advise if this is a problem. Follow-up by: Darcey Nora RN, CGRN,  June 25, 2010 4:17 PM  Additional Follow-up for Phone Call Additional follow up Details #1::        If they just need a colonoscopy and have no other reasons for office visit then we can set that up directly. Have we checked Echart for info? Additional Follow-up by: Iva Boop MD, Clementeen Graham,  June 28, 2010 7:18 AM    Additional Follow-up for Phone Call Additional follow up Details #2::    Left message for patient to call back Darcey Nora RN, [facility]  June 28, 2010 8:57 AM   No records of prior GI Hx in [place].  I have spoken with the patient I have canceled her OV appointment in January, and rescheduled her for direct colon 07-22-10 2:00, and pre-visit  06-15-10 1:00. Follow-up by: Darcey Nora RN, CGRN,  June 29, 2010 11:44 AM

## 2010-08-19 NOTE — Procedures (Signed)
Summary: Colonoscopy  Patient: Kayla [name] Note: All result statuses are Final unless otherwise noted.  Tests: (1) Colonoscopy (COL)   COL Colonoscopy           DONE     [facility]     [address]     [place], [place]  [address]           COLONOSCOPY PROCEDURE REPORT           PATIENT:  Kayla [name], Kayla [name]  MR#:  [id number]     BIRTHDATE:  27-May-1951, 59 yrs. old  GENDER:  female     ENDOSCOPIST:  Iva Boop, MD, [facility]     REF. BY:  Avel Peace, M.D.     PROCEDURE DATE:  07/22/2010     PROCEDURE:  Colonoscopy 19147     ASA CLASS:  Class II     INDICATIONS:  surveillance and high-risk screening, history of     colon cancer metastatic colon cancer diagnosed with perforation     and resection 10/2008     MEDICATIONS:   Fentanyl 50 mcg IV, Versed 6 mg IV           DESCRIPTION OF PROCEDURE:   After the risks benefits and     alternatives of the procedure were thoroughly explained, informed     consent was obtained.  Digital rectal exam was performed and     revealed no abnormalities.   The LB 180AL [id number] endoscope was     introduced through the colostomy and advanced to the cecum, which     was identified by both the appendix and ileocecal valve, without     limitations.  The quality of the prep was excellent, using     MoviPrep.  The instrument was then slowly withdrawn and then     removed. the anus was then passed and left colon examined with the     colonoscope. Insertion and withdrawal times not measured due to     prior resection.     <<PROCEDUREIMAGES>>           FINDINGS:  A postoperative change was noted. There is a RUQ     transverse colostomy and she is s/p left colon resection. The     right colon was normal. the scope was withdrawn  Mild     diverticulosis was found in the sigmoid colon. 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 This was otherwise a normal     examination of the colon   Retroflexed views in the rectum     revealed  no abnormalities except some mild contact friability of     the mucosa.    The scope was then withdrawn from the patient and     the procedure completed.           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           Iva Boop, MD, Clement[facility] Graham           CC:  Avel Peace, MD     Dorinda Hill  Murinson, MD     The Patient           n.     eSIGNED:   Iva Boop at 07/22/2010 02:42 PM           Grant Ruts, [id number]  Note: An exclamation mark (!) indicates a result that was not dispersed into the flowsheet. Document Creation Date: 07/22/2010 2:43 PM _______________________________________________________________________  (1) Order result status: Final Collection or observation date-time: 07/22/2010 14:26 Requested date-time:  Receipt date-time:  Reported date-time:  Referring Physician:   Ordering Physician: Stan Head [phone]) Specimen Source:  Source: Launa Grill Order Number: [id number] Lab site:   Appended Document: Colonoscopy    Clinical Lists Changes  Observations: Added new observation of COLONNXTDUE: 07/2013 (07/22/2010 15:44)

## 2010-08-19 NOTE — Miscellaneous (Signed)
Summary: [facility] previsit  Clinical Lists Changes  Medications: Added new medication of MOVIPREP 100 GM  SOLR (PEG-KCL-NACL-NASULF-NA ASC-C) As per prep instructions. - Signed Rx of MOVIPREP 100 GM  SOLR (PEG-KCL-NACL-NASULF-NA ASC-C) As per prep instructions.;  #1 x 0;  Signed;  Entered by: Karl Bales RN;  Authorized by: Iva Boop MD, FACG;  Method used: Electronically to [facility]. [address] *, [address] [place], [place]  [address], Ph: [phone] or [phone], Fax: [phone] Observations: Added new observation of ALLERGY REV: Done (07/15/2010 12:49)    Prescriptions: MOVIPREP 100 GM  SOLR (PEG-KCL-NACL-NASULF-NA ASC-C) As per prep instructions.  #1 x 0   Entered by:   Karl Bales RN   Authorized by:   Iva Boop MD, [facility]   Signed by:   Karl Bales RN on 07/15/2010   Method used:   Electronically to        [facility]. [address] * (retail)       [address]       [place], [place]  [address]       Ph: [phone] or [phone]       Fax: [phone]   RxID:   [id number]

## 2010-08-19 NOTE — Letter (Signed)
[facility]   Imported By: Roderic Ovens 10/06/2009 13:43:41  _____________________________________________________________________  External Attachment:    Type:   Image     Comment:   External Document

## 2010-08-19 NOTE — Letter (Signed)
[facility]   Imported By: Roderic Ovens 02/19/2010 14:12:05  _____________________________________________________________________  External Attachment:    Type:   Image     Comment:   External Document

## 2010-08-19 NOTE — Letter (Signed)
Summary: [facility]   Imported By: Lester [place] 07/06/2010 11:45:52  _____________________________________________________________________  External Attachment:    Type:   Image     Comment:   External Document

## 2010-08-19 NOTE — Letter (Signed)
Summary: [facility]  [facility]   Imported By: Marylou Mccoy 04/21/2010 10:49:28  _____________________________________________________________________  External Attachment:    Type:   Image     Comment:   External Document

## 2010-08-19 NOTE — Letter (Signed)
Summary: Previsit letter  [facility]  [address] [place], [place] [address]   Phone: [phone]  Fax: [phone]       06/29/2010 MRN: [id number]  [facility] [place] [address] [place], [place]  [address]  Dear Ms. Meath,  Welcome to the Gastroenterology Division at [facility].    You are scheduled to see a nurse for your pre-procedure visit on 07-15-10 at 1:00 on the 3rd floor at [facility], [address]  We ask that you try to arrive at our office 15 minutes prior to your appointment time to allow for check-in.  Your nurse visit will consist of discussing your medical and surgical history, your immediate family medical history, and your medications.    Please bring a complete list of all your medications or, if you prefer, bring the medication bottles and we will list them.  We will need to be aware of both prescribed and over the counter drugs.  We will need to know exact dosage information as well.  If you are on blood thinners (Coumadin, Plavix, Aggrenox, Ticlid, etc.) please call our office today/prior to your appointment, as we need to consult with your physician about holding your medication.   Please be prepared to read and sign documents such as consent forms, a financial agreement, and acknowledgement forms.  If necessary, and with your consent, a friend or relative is welcome to sit-in on the nurse visit with you.  Please bring your insurance card so that we may make a copy of it.  If your insurance requires a referral to see a specialist, please bring your referral form from your primary care physician.  No co-pay is required for this nurse visit.     If you cannot keep your appointment, please call [phone] to cancel or reschedule prior to your appointment date.  This allows [place] the opportunity to schedule an appointment for another patient in need of care.    Thank you for choosing [facility] for your medical  needs.  We appreciate the opportunity to care for you.  Please visit [place] at our website  to learn more about our practice.                     Sincerely.                                                                                                                   The Gastroenterology Division

## 2010-08-19 NOTE — Assessment & Plan Note (Signed)
Summary: np6/pericardial effusion/ok per heather/jml   Visit Type:  Initial Consult Referring Provider:  Murinson, D  CC:  pericardial effusion.  History of Present Illness: The patient is seen in consultation for Dr.Murinson to fully assess her cardiac status.  She has metastatic colon cancer with pulmonary nodules.  She has one nodule in each lung.  She is followed very carefully by Dr.Murinson.  She has been treated with chemotherapy.  Her studies have shown pericardial effusion and a 2-D echo was done to assess further.  Historically she had a 2-D echo in April, 2010.  That study did show pericardial effusion.  She had a followup echo in January, 2011 and there is question of some increase in the effusion size.  There is also question that she might have abnormal hemodynamics from the pericardial effusion.  The patient is not having chest pain.  She is not having significant shortness of breath.  She is not aware of significant tachycardia.  Current Medications (verified): 1)  Gabapentin 300 Mg Caps (Gabapentin) .... Three Times A Day 2)  Eloxatin 50 Mg/49ml Soln (Oxaliplatin) .... Biweekly 3)  Emla 2.5-2.5 % Crea (Lidocaine-Prilocaine) .[name].. 1-2 Hrs Before Treatment 4)  Fluorouracil 50 Mg/ml Soln (Fluorouracil) .... Biweekly 5)  Ferrous Sulfate 325 (65 Fe) Mg  Tabs (Ferrous Sulfate) .... Once Daily 6)  Neulasta 6 Mg/0.45ml Soln (Pegfilgrastim) .... As Needed 7)  Percocet 5-325 Mg Tabs (Oxycodone-Acetaminophen) .[name].. 1-2 As Needed 8)  Zofran 8 Mg Tabs (Ondansetron Hcl) .... Every 8 Hrs As Needed 9)  Lorazepam 0.5 Mg Tabs (Lorazepam) .... Every 4 Hrs As Needed 10)  Lisinopril 40 Mg Tabs (Lisinopril) .... Take One Tablet By Mouth Daily 11)  Avastin 100 Mg/4ml Soln (Bevacizumab) .... Biweekly  Allergies (verified): 1)  ! Sulfa  Past History:  Past Medical History: Last updated: 09/01/2009 PERICARDIAL EFFUSION (ICD-423.9)  .[name]..echo...08/13/2009 LVH...mod/severe...echo...07/2009 EF  65-70%  ...echo...07/2009    Family History: Reviewed history from 08/31/2009 and no changes required. Positive history of breast cancer in her mother, father   with history of coronary artery disease and diabetes.  She has two sons,  ages 37 and 30, alive and well.   Social History: Reviewed history from 08/31/2009 and no changes required. Full Time -- loader on a truck Single  Tobacco Use - Yes. -- 30 pack year history Alcohol Use - yes -- occasional Drug Use - no  Review of Systems       The patient denies fever, chills, rash, headache, sweats, change in vision, change in hearing, chest pain, cough, shortness of breath, nausea vomiting, urinary symptoms.  All other systems are reviewed and are negative.  Vital Signs:  Patient profile:   60 year old female Height:      64 inches Weight:      144 pounds BMI:     24.81 Pulse rate:   78 / minute BP sitting:   142 / 90  (left arm) Cuff size:   regular  Vitals Entered By: Hardin Negus, RMA (September 02, 2009 3:10 PM)  Physical Exam  General:  patient is stable in general today. Head:  head is atraumatic. Eyes:  there is no xanthelasma. Neck:  no jugular venous distention. Chest Wall:  no chest wall tenderness.  Patient does have a right-sided Port-A-Cath. Lungs:  lungs are clear.  Respiratory effort is nonlabored. Heart:  cardiac exam reveals S1-S2.  No clicks or significant murmurs. Abdomen:  abdomen is soft. Msk:  no musculoskeletal deformities. Extremities:  no  peripheral edema. Skin:  no skin rashes. Psych:  patient is oriented to person time and place.  Affect is normal.   Impression & Recommendations:  Problem # 1:  * METASTATIC COLON CANCER The patient's metastatic colon cancer is well managed by Dr.Murinson.  He is looking into all potential options for therapy.  Problem # 2:  VENTRICULAR HYPERTROPHY, LEFT (ICD-429.3)  Orders: EKG w/ Interpretation (93000)  The patient's two-dimensional echo shows  significant left ventricular hypertrophy.  It will be important to continue to treat her blood pressure.  She has normal wall motion.  Ejection fraction is 65-70%.  EKG is done today and reviewed by me.  She has decreased R wave in V1 V2 and V3.  Her 2-D echo shows that there is no evidence of myocardial infarction.  Problem # 3:  PERICARDIAL EFFUSION (ICD-423.9) The main issue is the pericardial effusion.  I have reviewed carefully the echo of April, 2010 and January, 2011.  The pericardial fluid is actually relatively small.  The effusion probably  is circumferential.  There is some right atrial collapse.  Right ventricular function is vigorous and careful review reveals that the timing of right ventricular function is normal.  There is no diastolic collapse of the right ventricle.  The inferior vena cava collapses normally.  There is no definite evidence of tamponade. There may be slight increase in  fluid since April, 2010, but this is a borderline call.  The patient has no evidence of elevated neck veins.  There is no resting tachycardia.  There is no hypotension.  The patient is not short of breath.  I have attempted to check her pulsus paradoxus carefully.  It is difficult but I'm not convinced that there is any increase in the pulsus.  Therefore, I feel that the patient's overall clinical status is stable relative to her pericardial effusion.  At this time I feel that no further workup is needed.  If she were to develop symptoms of shortness of breath or develop unexplained hypotension or tachycardia, followup echo should certainly be done.  I will plan to see her in cardiology follow up over time to follow this problem.  Patient Instructions: 1)  Follow up in 6 months

## 2010-08-19 NOTE — Letter (Signed)
[facility]   Imported By: Roderic Ovens 11/09/2009 14:55:54  _____________________________________________________________________  External Attachment:    Type:   Image     Comment:   External Document

## 2010-08-19 NOTE — Letter (Signed)
Summary: [facility]  [facility]   Imported By: Sherian Rein 06/17/2010 15:01:10  _____________________________________________________________________  External Attachment:    Type:   Image     Comment:   External Document

## 2010-08-19 NOTE — Letter (Signed)
Summary: [facility]  [facility]   Imported By: Kassie Mends 11/09/2009 09:42:00  _____________________________________________________________________  External Attachment:    Type:   Image     Comment:   External Document

## 2010-08-19 NOTE — Assessment & Plan Note (Signed)
Summary: f38m   Visit Type:  Follow-up Referring Bejamin Hackbart:  Murinson, D  CC:  pericardial effusion.  History of Present Illness: The patient is seen for followup of pericardial effusion.  I saw her last February, 2011.  The patient has metastatic colon cancer and pulmonary nodules.  These have been followed very carefully.  Historically she had a small pericardial effusion in April, 2010.  She had a followup echo in January 2 011.  I reviewed those echoes.  I felt there was no significant change.  There was no evidence of temporal physiology and the fluid was relatively small although it was circumferential.  She had no clinical signs of hemodynamically significant pericardial effusion.  Decided to follow her clinically.  She seen 6 months in followup at this time.  Current Medications (verified): 1)  Gabapentin 300 Mg Caps (Gabapentin) .... Four Times A Day 2)  Ferrous Sulfate 325 (65 Fe) Mg  Tabs (Ferrous Sulfate) .... Once Daily 3)  Lisinopril 40 Mg Tabs (Lisinopril) .... Take One Tablet By Mouth Daily 4)  Norvasc 5 Mg Tabs (Amlodipine Besylate) .... One Tablet By Mouth Daily  Allergies (verified): 1)  ! Sulfa  Past History:  Past Medical History: PERICARDIAL EFFUSION (ICD-423.9)  .[name]..echo...08/13/2009 LVH...mod/severe...echo...07/2009 EF 65-70%  ...echo...07/2009    Review of Systems       Patient denies fever, chills and a headache, sweats, rash, change in vision, change in hearing, chest pain, cough, shortness of breath, nausea or vomiting, urinary symptoms.  All of the systems are reviewed and are negative  Vital Signs:  Patient profile:   59 year old female Height:      64 inches Weight:      168 pounds BMI:     28.94 Pulse rate:   82 / minute BP sitting:   136 / 84  (right arm)  Vitals Entered By: Laurance Flatten CMA (February 25, 2010 10:14 AM)  Physical Exam  General:  patient is stable and differential. Eyes:  no xanthelasma. Neck:  no jugular venous  distention. Chest Wall:  evidence of her Port-A-Cath placement.  This is stable. Lungs:  lungs are clear respiratory effort is nonlabored. Heart:  cardiac exam reveals S1-S2.  No clicks or significant murmurs. Abdomen:  abdomen is soft. Extremities:  no peripheral edema. Psych:  patient is oriented to person time and place.  Affect is normal.   Impression & Recommendations:  Problem # 1:  * METASTATIC COLON CANCER This is followed carefully by her oncology doctors.  Problem # 2:  PERICARDIAL EFFUSION (ICD-423.9)  Her updated medication list for this problem includes:    Lisinopril 40 Mg Tabs (Lisinopril) .[name]... Take one tablet by mouth daily  Orders: EKG w/ Interpretation (93000) EKG is done today and reviewed by me.  There is no change.  There is no sinus tachycardia.  The patient is clinically stable.  She has no elevated neck veins and no sinus tachycardia.  She has no significant symptoms. There is no evidence that she has developed worsening pericardial effusion.  She does not need any tests at this time.  We'll see her back in one year in followup.  Patient Instructions: 1)  Your physician wants you to follow-up in:  1 year.  You will receive a reminder letter in the mail two months in advance. If you don't receive a letter, please call our office to schedule the follow-up appointment.

## 2010-08-19 NOTE — Letter (Signed)
Summary: [facility]   Imported By: Lester [place] 07/06/2010 11:46:58  _____________________________________________________________________  External Attachment:    Type:   Image     Comment:   External Document

## 2010-08-19 NOTE — Letter (Signed)
Summary: [facility]   Imported By: Lester [place] 07/06/2010 11:48:16  _____________________________________________________________________  External Attachment:    Type:   Image     Comment:   External Document

## 2010-08-19 NOTE — Letter (Signed)
[facility]   Imported By: Roderic Ovens 09/18/2009 14:27:33  _____________________________________________________________________  External Attachment:    Type:   Image     Comment:   External Document

## 2010-08-19 NOTE — Miscellaneous (Signed)
  Clinical Lists Changes  Problems: Added new problem of VENTRICULAR HYPERTROPHY, LEFT (ICD-429.3) Observations: Added new observation of PAST MED HX: PERICARDIAL EFFUSION (ICD-423.9)  .[name]..echo...08/13/2009 LVH...mod/severe...echo...07/2009 EF 65-70%  ...echo...07/2009   (09/01/2009 15:09)       Past History:  Past Medical History: PERICARDIAL EFFUSION (ICD-423.9)  .[name]..echo...08/13/2009 LVH...mod/severe...echo...07/2009 EF 65-70%  ...echo...07/2009

## 2010-08-19 NOTE — Letter (Signed)
Summary: [facility]   Imported By: Lester [place] 07/06/2010 11:56:29  _____________________________________________________________________  External Attachment:    Type:   Image     Comment:   External Document

## 2010-08-19 NOTE — Letter (Signed)
[facility]   Imported By: Marylou Mccoy 01/13/2010 10:37:17  _____________________________________________________________________  External Attachment:    Type:   Image     Comment:   External Document

## 2010-08-19 NOTE — Letter (Signed)
[facility]   Imported By: Marylou Mccoy 01/05/2010 11:16:14  _____________________________________________________________________  External Attachment:    Type:   Image     Comment:   External Document

## 2010-08-19 NOTE — Letter (Signed)
[facility]   Imported By: Roderic Ovens 10/06/2009 13:22:23  _____________________________________________________________________  External Attachment:    Type:   Image     Comment:   External Document

## 2010-08-19 NOTE — Letter (Signed)
Summary: [facility] Instructions  [facility]  [address] [place], [place] [address]   Phone: [phone]  Fax: [phone]       [facility] [place]    1950/12/22    MRN: [id number]        Procedure Day Dorna Bloom:  Lenor Coffin  07/22/10       Arrival Time:  1:00PM     Procedure Time:  2:00PM     Location of Procedure:                    _X _  [facility] (4th Floor)                     PREPARATION FOR COLONOSCOPY WITH MOVIPREP   Starting 5 days prior to your procedure 07/17/10 do not eat nuts, seeds, popcorn, corn, beans, peas,  salads, or any raw vegetables.  Do not take any fiber supplements (e.g. Metamucil, Citrucel, and Benefiber).  THE DAY BEFORE YOUR PROCEDURE         DATE: 07/21/10   DAY: WEDNESDAY  1.  Drink clear liquids the entire day-NO SOLID FOOD  2.  Do not drink anything colored red or purple.  Avoid juices with pulp.  No orange juice.  3.  Drink at least 64 oz. (8 glasses) of fluid/clear liquids during the day to prevent dehydration and help the prep work efficiently.  CLEAR LIQUIDS INCLUDE: Water Jello Ice Popsicles Tea (sugar ok, no milk/cream) Powdered fruit flavored drinks Coffee (sugar ok, no milk/cream) Gatorade Juice: apple, white grape, white cranberry  Lemonade Clear bullion, consomm, broth Carbonated beverages (any kind) Strained chicken noodle soup Hard Candy                             4.  In the morning, mix first dose of MoviPrep solution:    Empty 1 Pouch A and 1 Pouch B into the disposable container    Add lukewarm drinking water to the top line of the container. Mix to dissolve    Refrigerate (mixed solution should be used within 24 hrs)  5.  Begin drinking the prep at 5:00 p.m. The MoviPrep container is divided by 4 marks.   Every 15 minutes drink the solution down to the next mark (approximately 8 oz) until the full liter is complete.   6.  Follow completed prep with 16 oz of clear liquid of your choice  (Nothing red or purple).  Continue to drink clear liquids until bedtime.  7.  Before going to bed, mix second dose of MoviPrep solution:    Empty 1 Pouch A and 1 Pouch B into the disposable container    Add lukewarm drinking water to the top line of the container. Mix to dissolve    Refrigerate  THE DAY OF YOUR PROCEDURE      DATE: 07/22/10   DAY: THURSDAY  Beginning at 9:00AM (5 hours before procedure):         1. Every 15 minutes, drink the solution down to the next mark (approx 8 oz) until the full liter is complete.  2. Follow completed prep with 16 oz. of clear liquid of your choice.    3. You may drink clear liquids until 12:00PM (2 HOURS BEFORE PROCEDURE).   MEDICATION INSTRUCTIONS  Unless otherwise instructed, you should take regular prescription medications with a small sip of water   as early as possible the  morning of your procedure.         OTHER INSTRUCTIONS  You will need a responsible adult at least 58 years of age to accompany you and drive you home.   This person must remain in the waiting room during your procedure.  Wear loose fitting clothing that is easily removed.  Leave jewelry and other valuables at home.  However, you may wish to bring a book to read or  an iPod/MP3 player to listen to music as you wait for your procedure to start.  Remove all body piercing jewelry and leave at home.  Total time from sign-in until discharge is approximately 2-3 hours.  You should go home directly after your procedure and rest.  You can resume normal activities the  day after your procedure.  The day of your procedure you should not:   Drive   Make legal decisions   Operate machinery   Drink alcohol   Return to work  You will receive specific instructions about eating, activities and medications before you leave.    The above instructions have been reviewed and explained to me by   Karl Bales RN  July 15, 2010 1:15 PM    I fully  understand and can verbalize these instructions _____________________________ Date _________

## 2010-08-19 NOTE — Letter (Signed)
Summary: [facility]  [facility]   Imported By: Marylou Mccoy 05/03/2010 07:28:46  _____________________________________________________________________  External Attachment:    Type:   Image     Comment:   External Document

## 2010-09-01 ENCOUNTER — Other Ambulatory Visit: Payer: Self-pay | Admitting: General Surgery

## 2010-09-01 ENCOUNTER — Encounter (HOSPITAL_COMMUNITY): Payer: Self-pay | Attending: General Surgery

## 2010-09-01 DIAGNOSIS — Z01812 Encounter for preprocedural laboratory examination: Secondary | ICD-10-CM | POA: Insufficient documentation

## 2010-09-01 DIAGNOSIS — Z01810 Encounter for preprocedural cardiovascular examination: Secondary | ICD-10-CM | POA: Insufficient documentation

## 2010-09-01 LAB — CBC
HCT: 41.6 % (ref 36.0–46.0)
Hemoglobin: 13.7 g/dL (ref 12.0–15.0)
MCHC: 32.9 g/dL (ref 30.0–36.0)
RBC: 4.92 MIL/uL (ref 3.87–5.11)

## 2010-09-01 LAB — COMPREHENSIVE METABOLIC PANEL
ALT: 29 U/L (ref 0–35)
AST: 32 U/L (ref 0–37)
Albumin: 3.8 g/dL (ref 3.5–5.2)
Alkaline Phosphatase: 127 U/L — ABNORMAL HIGH (ref 39–117)
BUN: 14 mg/dL (ref 6–23)
Chloride: 104 mEq/L (ref 96–112)
Potassium: 4 mEq/L (ref 3.5–5.1)
Sodium: 141 mEq/L (ref 135–145)
Total Bilirubin: 0.7 mg/dL (ref 0.3–1.2)
Total Protein: 7.8 g/dL (ref 6.0–8.3)

## 2010-09-01 LAB — PROTIME-INR: INR: 0.95 (ref 0.00–1.49)

## 2010-09-01 LAB — DIFFERENTIAL
Basophils Absolute: 0 10*3/uL (ref 0.0–0.1)
Lymphocytes Relative: 23 % (ref 12–46)
Monocytes Absolute: 0.8 10*3/uL (ref 0.1–1.0)
Monocytes Relative: 8 % (ref 3–12)
Neutro Abs: 7 10*3/uL (ref 1.7–7.7)
Neutrophils Relative %: 67 % (ref 43–77)

## 2010-09-01 LAB — SURGICAL PCR SCREEN

## 2010-09-09 ENCOUNTER — Inpatient Hospital Stay (HOSPITAL_COMMUNITY): Admission: RE | Admit: 2010-09-09 | Payer: Self-pay | Source: Ambulatory Visit | Admitting: General Surgery

## 2010-09-13 ENCOUNTER — Encounter (HOSPITAL_COMMUNITY): Payer: Self-pay | Attending: General Surgery

## 2010-09-13 DIAGNOSIS — Z433 Encounter for attention to colostomy: Secondary | ICD-10-CM | POA: Insufficient documentation

## 2010-09-13 DIAGNOSIS — I10 Essential (primary) hypertension: Secondary | ICD-10-CM | POA: Insufficient documentation

## 2010-09-13 DIAGNOSIS — Z01812 Encounter for preprocedural laboratory examination: Secondary | ICD-10-CM | POA: Insufficient documentation

## 2010-09-13 DIAGNOSIS — Z79899 Other long term (current) drug therapy: Secondary | ICD-10-CM | POA: Insufficient documentation

## 2010-09-13 LAB — COMPREHENSIVE METABOLIC PANEL
AST: 24 U/L (ref 0–37)
Albumin: 3.8 g/dL (ref 3.5–5.2)
Chloride: 105 mEq/L (ref 96–112)
Creatinine, Ser: 0.76 mg/dL (ref 0.4–1.2)
GFR calc Af Amer: >60 mL/min (ref >60)
Total Bilirubin: 0.9 mg/dL (ref 0.3–1.2)
Total Protein: 7.7 g/dL (ref 6.0–8.3)

## 2010-09-13 LAB — DIFFERENTIAL
Basophils Absolute: 0 10*3/uL (ref 0.0–0.1)
Eosinophils Relative: 2 % (ref 0–5)
Lymphocytes Relative: 24 % (ref 12–46)
Lymphs Abs: 2 10*3/uL (ref 0.7–4.0)
Monocytes Absolute: 0.6 10*3/uL (ref 0.1–1.0)
Monocytes Relative: 7 % (ref 3–12)

## 2010-09-13 LAB — CBC
HCT: 42.4 % (ref 36.0–46.0)
Hemoglobin: 13.9 g/dL (ref 12.0–15.0)
MCH: 28.3 pg (ref 26.0–34.0)
MCHC: 32.8 g/dL (ref 30.0–36.0)
MCV: 86.4 fL (ref 78.0–100.0)
RDW: 15.2 % (ref 11.5–15.5)

## 2010-09-14 NOTE — Progress Notes (Signed)
Summary: [facility]: Office Progress Note  [facility]: Office Progress Note   Imported By: Earl Many 09/08/2010 09:11:41  _____________________________________________________________________  External Attachment:    Type:   Image     Comment:   External Document

## 2010-09-20 ENCOUNTER — Inpatient Hospital Stay (HOSPITAL_COMMUNITY)
Admission: RE | Admit: 2010-09-20 | Discharge: 2010-09-29 | DRG: 330 | Disposition: A | Discharge status: Home Health | Payer: Medicaid Other | Source: Ambulatory Visit | Attending: General Surgery | Admitting: General Surgery

## 2010-09-20 ENCOUNTER — Other Ambulatory Visit: Payer: Self-pay | Admitting: General Surgery

## 2010-09-20 DIAGNOSIS — Y9210 Unspecified residential institution as the place of occurrence of the external cause: Secondary | ICD-10-CM | POA: Diagnosis not present

## 2010-09-20 DIAGNOSIS — Z85038 Personal history of other malignant neoplasm of large intestine: Secondary | ICD-10-CM

## 2010-09-20 DIAGNOSIS — I10 Essential (primary) hypertension: Secondary | ICD-10-CM | POA: Diagnosis present

## 2010-09-20 DIAGNOSIS — K560 Paralytic ileus: Secondary | ICD-10-CM | POA: Diagnosis not present

## 2010-09-20 DIAGNOSIS — Z79899 Other long term (current) drug therapy: Secondary | ICD-10-CM

## 2010-09-20 DIAGNOSIS — K660 Peritoneal adhesions (postprocedural) (postinfection): Secondary | ICD-10-CM | POA: Diagnosis present

## 2010-09-20 DIAGNOSIS — Z433 Encounter for attention to colostomy: Principal | ICD-10-CM

## 2010-09-20 DIAGNOSIS — IMO0002 Reserved for concepts with insufficient information to code with codable children: Secondary | ICD-10-CM | POA: Diagnosis not present

## 2010-09-20 LAB — ABO/RH: ABO/RH(D): A POS

## 2010-09-21 LAB — BASIC METABOLIC PANEL
CO2: 25 mEq/L (ref 19–32)
Calcium: 8.7 mg/dL (ref 8.4–10.5)
Chloride: 108 mEq/L (ref 96–112)
GFR calc Af Amer: >60 mL/min (ref >60)
Glucose, Bld: 123 mg/dL — ABNORMAL HIGH (ref 70–99)
Potassium: 4.7 mEq/L (ref 3.5–5.1)
Sodium: 137 mEq/L (ref 135–145)

## 2010-09-21 LAB — CBC
HCT: 39.6 % (ref 36.0–46.0)
Hemoglobin: 12.8 g/dL (ref 12.0–15.0)
MCHC: 32.3 g/dL (ref 30.0–36.0)
RBC: 4.66 MIL/uL (ref 3.87–5.11)
WBC: 15.1 10*3/uL — ABNORMAL HIGH (ref 4.0–10.5)

## 2010-09-21 LAB — TYPE AND SCREEN

## 2010-09-25 NOTE — Op Note (Signed)
NAMESHERETTA, [name]               ACCOUNT NO.:  [id number]  MEDICAL RECORD NO.:  [id number]           PATIENT TYPE:  I  LOCATION:  X003                         FACILITY:  [facility]  PHYSICIAN:  Kayla [name], M.D.DATE OF BIRTH:  07/20/1950  DATE OF PROCEDURE:  09/20/2010 DATE OF DISCHARGE:                              OPERATIVE REPORT   PREOPERATIVE DIAGNOSIS:  Colostomy.  POSTOPERATIVE DIAGNOSIS:  Colostomy.  PROCEDURE:  Exploratory laparotomy, lysis of adhesions for 90 minutes, small-bowel resection, colostomy reversal.  SURGEON:  Kayla [name], M.D.  ASSISTANT:  Kayla [name]. Kayla [name], M.D.  ANESTHESIA:  General.  INDICATIONS:  This 60 year old female had a perforated left colon cancer about 2 years ago and underwent a left colectomy with transverse colostomy in the right upper quadrant.  She received multiple treatments and now is disease free.  She requests colostomy closure and thus presents for that.  Procedure and risks were explained to Kayla preoperatively.  TECHNIQUE:  She was brought to the operating room, placed supine on the operating table and general anesthetic was administered.  A Foley catheter and nasogastric tube were inserted.  She was placed in the lithotomy position.  The abdominal wall was sterilely prepped and draped and the colostomy was prepared with a Betadine-soaked sponge followed by Tegaderm.  A long midline incision was reincised sharply down through the skin and subcutaneous tissue.  The fascia was divided.  Peritoneal cavity was entered.  There were omental adhesions between the fascia and the abdominal wall.  The superior aspect of the incision was taken down with electrocautery.  On the inferior aspect, incision had small bowel adherent to it.  This was taken down sharply.  I then freed up all the omental adhesions and small bowel adhesions to the abdominal wall near the edges of the fascia using sharp dissection and electrocautery.   This began by lysing adhesions between some of the small bowel and what remained of the omentum as well as small bowel and left abdominal wall until I identified the sigmoid colon stump in the left lower quadrant. I freed this up from lateral adhesions sharply and had a fairly good length of sigmoid colon being able to be brought up to the level of the umbilicus.  I freed up small bowel adhesions to the sigmoid colon mesentery sharply.  Following this, I then approached the colostomy and began freeing up adhesions between the colostomy in the mesentery and the abdominal wall as well as colostomy in the fascia internally.  After I had done this, I then made an elliptical incision around the colostomy in the skin in the right upper quadrant, carried this down to the subcutaneous tissue until I was completely able to free up the colostomy and bring it into the abdominal wound.  I was able to gain length by dissecting small bowel adhesions and adhesions between the liver and the colostomy as well as mobilizing some hepatic flexure.  In order to free the colostomy, I then amputated the colostomy using a GIA stapler and handed this off the field.  I was able to stretch the colostomy site down to  the sigmoid colon, but I needed to get some of the small bowel that was adherent to the deep left abdominal wall freed up.  This was quite tedious and took a lot of blunt and sharp dissection.  An enterotomy was made in one of the loops of the small bowel while this was being performed as well as a small serosal tear.  I repaired the serosal tear with 3-0 silk interrupted sutures.  Once I was able to free all of this up from the adhesions some of which were to the kidney, I was able to identify the left ureter and trace its course and it was unharmed.  I then brought the loop of small bowel with the enterotomy up into the wound.  I created mesenteric defects in either side of the enterotomy and divided  the mesentery between [facility] clamps. I then performed a side-to-side small bowel anastomosis in a stapled fashion.  I then completed the resection of the area of the enterotomy as well as closed the common defect with linear noncutting stapler.  The small bowel segment was handed off the field.  The mesenteric defect was closed with interrupted 3-0 silk sutures.  Anastomosis was patent, viable and under no tension without evidence of leak.  The distal staple line was reinforced with a single 3-0 silk suture.  Following this, I then copiously irrigated out the left side of the abdomen.  I was then able to place the transverse colon in side-to-side fashion with the sigmoid colon.  I performed a side-to-side stapled anastomosis between the transverse colon and the sigmoid colon.  The common defect was closed in 2 layers.  It was first closed with running 3-0 Vicryl locking suture.  Second layer was 3-0 silk sutures in a Lembert-type fashion.  The anastomosis was patent, viable and under no tension and showed no evidence of leak.  Distal staple line was reinforced with a 3-0 silk suture.  Following this, gloves were changed.  Copious irrigation was then performed of the abdominal cavity and 4-quadrant inspection was performed.  There was no evidence of bleeding or organ injury.  I subsequently directed my attention to the colostomy site fascial defect, closed this with running #1 PDS suture.  Needle, sponge and instrument counts were reportedly correct at this time.  I then closed the midline incision with a running #1 PDS suture.  The colostomy site was packed with saline moistened gauze.  I irrigated the subcutaneous tissue of the midline incision and closed it loosely with staples.  Telfa wicks were then placed in between the staples and bulky dressings were applied.  She tolerated the procedures well without any apparent complications and was taken to the recovery room in satisfactory  condition.     Kayla [name], M.D.     Kayla [name]  D:  09/20/2010  T:  09/20/2010  Job:  [id number]  cc:   Kayla [name], M.D. Fax: [phone]  Electronically Signed by Avel Peace M.D. on 09/25/2010 09:41:36 PM

## 2010-09-28 LAB — BASIC METABOLIC PANEL
CO2: 25 mEq/L (ref 19–32)
Chloride: 106 mEq/L (ref 96–112)
Creatinine, Ser: 0.79 mg/dL (ref 0.4–1.2)
GFR calc Af Amer: >60 mL/min (ref >60)
Glucose, Bld: 103 mg/dL — ABNORMAL HIGH (ref 70–99)

## 2010-09-28 LAB — CBC
HCT: 32.8 % — ABNORMAL LOW (ref 36.0–46.0)
Hemoglobin: 10.6 g/dL — ABNORMAL LOW (ref 12.0–15.0)
MCH: 27.7 pg (ref 26.0–34.0)
RBC: 3.82 MIL/uL — ABNORMAL LOW (ref 3.87–5.11)

## 2010-10-05 ENCOUNTER — Ambulatory Visit (HOSPITAL_COMMUNITY)
Admission: RE | Admit: 2010-10-05 | Discharge: 2010-10-05 | Disposition: A | Discharge status: Home / Self Care | Payer: Medicaid Other | Source: Ambulatory Visit | Attending: Oncology | Admitting: Oncology

## 2010-10-05 ENCOUNTER — Other Ambulatory Visit (HOSPITAL_COMMUNITY): Payer: Self-pay | Admitting: Oncology

## 2010-10-05 ENCOUNTER — Encounter (HOSPITAL_BASED_OUTPATIENT_CLINIC_OR_DEPARTMENT_OTHER): Payer: Self-pay | Admitting: Oncology

## 2010-10-05 DIAGNOSIS — I319 Disease of pericardium, unspecified: Secondary | ICD-10-CM | POA: Insufficient documentation

## 2010-10-05 DIAGNOSIS — D739 Disease of spleen, unspecified: Secondary | ICD-10-CM | POA: Insufficient documentation

## 2010-10-05 DIAGNOSIS — J984 Other disorders of lung: Secondary | ICD-10-CM

## 2010-10-05 DIAGNOSIS — J9819 Other pulmonary collapse: Secondary | ICD-10-CM | POA: Insufficient documentation

## 2010-10-05 DIAGNOSIS — C186 Malignant neoplasm of descending colon: Secondary | ICD-10-CM

## 2010-10-05 DIAGNOSIS — Z9221 Personal history of antineoplastic chemotherapy: Secondary | ICD-10-CM | POA: Insufficient documentation

## 2010-10-05 DIAGNOSIS — R918 Other nonspecific abnormal finding of lung field: Secondary | ICD-10-CM

## 2010-10-05 DIAGNOSIS — C189 Malignant neoplasm of colon, unspecified: Secondary | ICD-10-CM | POA: Insufficient documentation

## 2010-10-05 DIAGNOSIS — C3490 Malignant neoplasm of unspecified part of unspecified bronchus or lung: Secondary | ICD-10-CM

## 2010-10-05 DIAGNOSIS — I517 Cardiomegaly: Secondary | ICD-10-CM | POA: Insufficient documentation

## 2010-10-05 LAB — CMP (CANCER CENTER ONLY)
Alkaline Phosphatase: 184 U/L — ABNORMAL HIGH (ref 26–84)
BUN, Bld: 13 mg/dL (ref 7–22)
Glucose, Bld: 134 mg/dL — ABNORMAL HIGH (ref 73–118)
Total Bilirubin: 0.6 mg/dl (ref 0.20–1.60)

## 2010-10-05 LAB — CBC WITH DIFFERENTIAL/PLATELET
Basophils Absolute: 0 10*3/uL (ref 0.0–0.1)
Eosinophils Absolute: 0.3 10*3/uL (ref 0.0–0.5)
HGB: 12.5 g/dL (ref 11.6–15.9)
LYMPH%: 21.5 % (ref 14.0–49.7)
MCH: 28.1 pg (ref 25.1–34.0)
MCV: 85.3 fL (ref 79.5–101.0)
MONO%: 6.1 % (ref 0.0–14.0)
NEUT#: 5.8 10*3/uL (ref 1.5–6.5)
Platelets: 320 10*3/uL (ref 145–400)
RBC: 4.45 10*6/uL (ref 3.70–5.45)

## 2010-10-05 LAB — CEA: CEA: 1.5 ng/mL (ref 0.0–5.0)

## 2010-10-05 MED ORDER — IOHEXOL 300 MG/ML  SOLN
80.0000 mL | Freq: Once | INTRAMUSCULAR | Status: AC | PRN
  Administered 2010-10-05: 80 mL via INTRAVENOUS

## 2010-10-08 ENCOUNTER — Encounter (HOSPITAL_BASED_OUTPATIENT_CLINIC_OR_DEPARTMENT_OTHER): Payer: Self-pay | Admitting: Oncology

## 2010-10-08 DIAGNOSIS — C3490 Malignant neoplasm of unspecified part of unspecified bronchus or lung: Secondary | ICD-10-CM

## 2010-10-08 DIAGNOSIS — J984 Other disorders of lung: Secondary | ICD-10-CM

## 2010-10-08 DIAGNOSIS — C186 Malignant neoplasm of descending colon: Secondary | ICD-10-CM

## 2010-10-26 LAB — BASIC METABOLIC PANEL
Chloride: 103 mEq/L (ref 96–112)
GFR calc Af Amer: >60 mL/min (ref >60)
Potassium: 3.9 mEq/L (ref 3.5–5.1)
Sodium: 140 mEq/L (ref 135–145)

## 2010-10-26 LAB — CBC
HCT: 37.9 % (ref 36.0–46.0)
Hemoglobin: 12.5 g/dL (ref 12.0–15.0)
MCV: 76.5 fL — ABNORMAL LOW (ref 78.0–100.0)
RBC: 4.95 MIL/uL (ref 3.87–5.11)
WBC: 9 10*3/uL (ref 4.0–10.5)

## 2010-10-26 LAB — PROTIME-INR: Prothrombin Time: 12.6 seconds (ref 11.6–15.2)

## 2010-10-26 LAB — GLUCOSE, CAPILLARY: Glucose-Capillary: 68 mg/dL — ABNORMAL LOW (ref 70–99)

## 2010-10-27 LAB — DIFFERENTIAL
Blasts: 0 %
Myelocytes: 0 %
Neutro Abs: 6.5 10*3/uL (ref 1.7–7.7)
Neutrophils Relative %: 47 % (ref 43–77)
Promyelocytes Absolute: 0 %
nRBC: 0 /100 WBC

## 2010-10-27 LAB — CBC
HCT: 29.5 % — ABNORMAL LOW (ref 36.0–46.0)
HCT: 32.1 % — ABNORMAL LOW (ref 36.0–46.0)
HCT: 32.3 % — ABNORMAL LOW (ref 36.0–46.0)
HCT: 33.4 % — ABNORMAL LOW (ref 36.0–46.0)
Hemoglobin: 10.6 g/dL — ABNORMAL LOW (ref 12.0–15.0)
Hemoglobin: 10.7 g/dL — ABNORMAL LOW (ref 12.0–15.0)
Hemoglobin: 11.1 g/dL — ABNORMAL LOW (ref 12.0–15.0)
MCHC: 32.7 g/dL (ref 30.0–36.0)
MCHC: 31 g/dL (ref 30.0–36.0)
MCHC: 32 g/dL (ref 30.0–36.0)
MCHC: 33.1 g/dL (ref 30.0–36.0)
MCHC: 33.2 g/dL (ref 30.0–36.0)
MCV: 71.4 fL — ABNORMAL LOW (ref 78.0–100.0)
MCV: 72.3 fL — ABNORMAL LOW (ref 78.0–100.0)
MCV: 62 fL — ABNORMAL LOW (ref 78.0–100.0)
MCV: 72 fL — ABNORMAL LOW (ref 78.0–100.0)
MCV: 72.6 fL — ABNORMAL LOW (ref 78.0–100.0)
Platelets: 472 10*3/uL — ABNORMAL HIGH (ref 150–400)
Platelets: 504 10*3/uL — ABNORMAL HIGH (ref 150–400)
Platelets: 196 10*3/uL (ref 150–400)
Platelets: 252 10*3/uL (ref 150–400)
Platelets: 299 10*3/uL (ref 150–400)
RBC: 4.13 MIL/uL (ref 3.87–5.11)
RBC: 4.49 MIL/uL (ref 3.87–5.11)
RBC: 4.6 MIL/uL (ref 3.87–5.11)
RDW: 29.5 % — ABNORMAL HIGH (ref 11.5–15.5)
RDW: 25.6 % — ABNORMAL HIGH (ref 11.5–15.5)
RDW: 26.8 % — ABNORMAL HIGH (ref 11.5–15.5)
RDW: 27.5 % — ABNORMAL HIGH (ref 11.5–15.5)
WBC: 17.8 10*3/uL — ABNORMAL HIGH (ref 4.0–10.5)
WBC: 19.9 10*3/uL — ABNORMAL HIGH (ref 4.0–10.5)
WBC: 21.3 10*3/uL — ABNORMAL HIGH (ref 4.0–10.5)

## 2010-10-27 LAB — BASIC METABOLIC PANEL
BUN: 2 mg/dL — ABNORMAL LOW (ref 6–23)
BUN: 3 mg/dL — ABNORMAL LOW (ref 6–23)
BUN: 11 mg/dL (ref 6–23)
BUN: 18 mg/dL (ref 6–23)
BUN: 6 mg/dL (ref 6–23)
BUN: 9 mg/dL (ref 6–23)
CO2: 26 mEq/L (ref 19–32)
CO2: 22 mEq/L (ref 19–32)
CO2: 22 mEq/L (ref 19–32)
CO2: 25 mEq/L (ref 19–32)
CO2: 27 mEq/L (ref 19–32)
Calcium: 7.3 mg/dL — ABNORMAL LOW (ref 8.4–10.5)
Calcium: 8 mg/dL — ABNORMAL LOW (ref 8.4–10.5)
Calcium: 8.1 mg/dL — ABNORMAL LOW (ref 8.4–10.5)
Chloride: 102 mEq/L (ref 96–112)
Chloride: 109 mEq/L (ref 96–112)
Chloride: 102 mEq/L (ref 96–112)
Chloride: 105 mEq/L (ref 96–112)
Chloride: 108 mEq/L (ref 96–112)
Chloride: 108 mEq/L (ref 96–112)
Creatinine, Ser: 0.67 mg/dL (ref 0.4–1.2)
Creatinine, Ser: 0.59 mg/dL (ref 0.4–1.2)
Creatinine, Ser: 0.78 mg/dL (ref 0.4–1.2)
GFR calc Af Amer: >60 mL/min (ref >60)
GFR calc non Af Amer: >60 mL/min (ref >60)
GFR calc non Af Amer: >60 mL/min (ref >60)
GFR calc non Af Amer: >60 mL/min (ref >60)
Glucose, Bld: 91 mg/dL (ref 70–99)
Glucose, Bld: 103 mg/dL — ABNORMAL HIGH (ref 70–99)
Glucose, Bld: 108 mg/dL — ABNORMAL HIGH (ref 70–99)
Glucose, Bld: 87 mg/dL (ref 70–99)
Potassium: 4 mEq/L (ref 3.5–5.1)
Potassium: 3.2 mEq/L — ABNORMAL LOW (ref 3.5–5.1)
Potassium: 3.4 mEq/L — ABNORMAL LOW (ref 3.5–5.1)
Potassium: 4.2 mEq/L (ref 3.5–5.1)
Potassium: 6.4 mEq/L (ref 3.5–5.1)
Sodium: 138 mEq/L (ref 135–145)
Sodium: 139 mEq/L (ref 135–145)
Sodium: 141 mEq/L (ref 135–145)

## 2010-10-27 LAB — CROSSMATCH
ABO/RH(D): A POS
Antibody Screen: NEGATIVE

## 2010-10-27 LAB — POCT I-STAT 7, (LYTES, BLD GAS, ICA,H+H)
Bicarbonate: 25.4 mEq/L — ABNORMAL HIGH (ref 20.0–24.0)
O2 Saturation: 100 %
Patient temperature: 37.2
Potassium: 3.4 mEq/L — ABNORMAL LOW (ref 3.5–5.1)
TCO2: 27 mmol/L (ref 0–100)
pCO2 arterial: 38.7 mmHg (ref 35.0–45.0)

## 2010-10-27 LAB — POCT I-STAT 4, (NA,K, GLUC, HGB,HCT): Glucose, Bld: 127 mg/dL — ABNORMAL HIGH (ref 70–99)

## 2010-10-27 LAB — TRANSFERRIN: Transferrin: 162 mg/dL — ABNORMAL LOW (ref 212–360)

## 2010-10-27 LAB — VITAMIN B12: Vitamin B-12: 368 pg/mL (ref 211–911)

## 2010-10-27 LAB — URINALYSIS, ROUTINE W REFLEX MICROSCOPIC
Bilirubin Urine: NEGATIVE
Glucose, UA: NEGATIVE mg/dL
Hgb urine dipstick: NEGATIVE
Protein, ur: NEGATIVE mg/dL
Urobilinogen, UA: 0.2 mg/dL (ref 0.0–1.0)

## 2010-10-27 LAB — PROTIME-INR
INR: 1.4 (ref 0.00–1.49)
Prothrombin Time: 17.8 seconds — ABNORMAL HIGH (ref 11.6–15.2)

## 2010-10-27 LAB — IRON AND TIBC: TIBC: 222 ug/dL — ABNORMAL LOW (ref 250–470)

## 2010-10-27 LAB — ABO/RH: ABO/RH(D): A POS

## 2010-10-27 LAB — APTT: aPTT: 42 seconds — ABNORMAL HIGH (ref 24–37)

## 2010-11-11 NOTE — Discharge Summary (Signed)
  NAMESHABANA, ARMENTROUT               ACCOUNT NO.:  [id number]  MEDICAL RECORD NO.:  [id number]           PATIENT TYPE:  I  LOCATION:  1534                         FACILITY:  [facility]  PHYSICIAN:  [name], M.D.DATE OF BIRTH:  [birth year]  DATE OF ADMISSION:  09/20/2010 DATE OF DISCHARGE:  09/29/2010                              DISCHARGE SUMMARY   PRINCIPAL DIAGNOSIS:  Colostomy.  SECONDARY DIAGNOSES: 1. Stage IV colon cancer. 2. Postoperative ileus. 3. Acute blood loss anemia.  PROCEDURE:  Exploratory laparotomy, lysis of adhesions, small-bowel resection, colostomy reversal September 20, 2010.  REASON FOR ADMISSION:  Ms. Wehrli is a 60 year old female who had a perforated left colon cancer about two years ago and underwent a left colectomy and transverse colostomy.  She had multiple treatments and there were some lesions in her elsewhere that were felt to potentially be metastatic disease, although these responded to treatment and she is disease-free at this time.  She requests colostomy closure and after a discussion with Dr. [name] we feel that this is reasonable and she was admitted for that.  HOSPITAL COURSE:  She was admitted and tolerated the procedure fairly well.  She had an open wound that we were packing where the colostomy site was.  It was midline with some wicks.  She did have a very mild acute blood loss anemia with a hematocrit of 32.8 at its nadir.  Bowel function began returning and her diet was slowly advanced.  By September 29, 2010, she was having wound care done by nurses, she was tolerating a diet and able to be discharged.  DISPOSITION:  Discharged to home on September 29, 2010.  ACTIVITY:  She has activity restrictions and will have home health nurses assist her with her wound care.  MEDICATIONS:  She was given some Vicodin for pain.  FOLLOWUP:  She will follow up in the office in 1 to 2 weeks.  CONDITION ON DISCHARGE:  She was in satisfactory  condition.     [name], M.D.     Kari Baars  D:  11/09/2010  T:  11/09/2010  Job:  [id number]  cc:   [name], M.D. [address] [place] [address]  Samul Dada, M.D. Fax: [phone]  Electronically Signed by Avel Peace M.D. on 11/11/2010 10:39:25 AM

## 2010-11-30 NOTE — Consult Note (Signed)
Kayla [name], Kayla [name]               ACCOUNT NO.:  [id number]   MEDICAL RECORD NO.:  [id number]          PATIENT TYPE:  INP   LOCATION:  2314                         FACILITY:  [facility]   PHYSICIAN:  Everardo Beals. Juanda Chance, MD, FACCDATE OF BIRTH:  05/06/1951   DATE OF CONSULTATION:  10/27/2008  DATE OF DISCHARGE:                                 CONSULTATION   PRIMARY CARE PHYSICIAN:  The patient does not have one.   REQUESTING PHYSICIAN:  [name], MD, surgeon.   PRIMARY CARDIOLOGIST:  Delora Fuel Juanda Chance, MD, [facility].   REASON FOR CONSULTATION:  Large pericardial effusion.   HISTORY OF PRESENT ILLNESS:  A 60 year old African American female who  was admitted from [facility] after experiencing sudden onset of  abdominal pain.  CT scan revealed perforated viscus, most likely  intestinal and the patient was transferred emergently to [facility] where she underwent an exploratory laparoscopy, left colectomy,  transverse colostomy, and partial omentectomy.  The patient was noted on  CT scan to have a large pericardial effusion.  Dr. Abbey Chatters did speak  with Dr. Rexanne Mano, CV[facility] concerning this and he did not feel that the  patient needed a pericardial window at that time.  The patient has no  prior cardiac history.  She does have a history of hypertension, but  does not take any medications.  She does not see a physician regularly.   REVIEW OF SYSTEMS:  Positive for sudden onset nausea, vomiting,  diarrhea, severe abdominal pain, and sweating.  All other systems  reviewed and found to be negative.   PAST MEDICAL HISTORY:  Hypertension, but on no medications.   SOCIAL HISTORY:  She lives in [place] with family.  She works loading  trucks.  She has a 30-pack-a-year history of smoking.  Occasional  alcohol use.   FAMILY HISTORY:  Unknown at this time.  No history of drugs.   PAST SURGICAL HISTORY:  None.   CURRENT MEDICATIONS AT HOME:  None.   ALLERGIES:   SULFA.   CURRENT MEDICATIONS DURING HOSPITALIZATION:  1. Morphine.  2. Zosyn 3.375 mg IV q.8 h.   CURRENT LABORATORY DATA:  Hemoglobin 11.1, hematocrit 33.4, white blood  cells 12.8, and platelets 206.  Sodium 138, potassium 3.4, chloride 108,  CO2 of 22, BUN 17, creatinine 0.61, and glucose 108.  PTT 42, PT 17.8,  and INR 1.4.  EKG revealing normal sinus rhythm, ventricular rate of 99  beats per minute.  CT scan of the abdomen revealing severe wall  thickening and enhancement in hepatic flexure with rind of wall tissue  measuring up to 3.1 cm on a single side, possibly representing colon  carcinoma, lymphoma, or possibility gastrointestinal stromal tumor which  appears to likely be the site of her perforation.  The patient also had  a small amount of free intraperitoneal gas, a large pericardial  effusion, 11-mm lower lobe pulmonary nodules suspicious for malignancy  and small nonspecific splenic mass.   PHYSICAL EXAMINATION:  VITAL SIGNS:  Blood pressure 140/67, pulse 85,  respirations 17, temperature 97.6, and O2 sat  98% on 2 liters.  HEENT:  Head is normocephalic and atraumatic.  Eyes, PERRLA.  She is  sedated, but responsive.  NECK:  Supple.  There is no bruit.  There is mild JVD.  CARDIOVASCULAR:  Tachycardic with S1 and S2 noted without any murmur at  present.  Pulses are 2+ and equal without bruits.  LUNGS:  Diminished breath sounds, bibasilar.  ABDOMEN:  Bowel sounds are absent.  She is tender.  There is a dressing  noted with a nasogastric tube to suction.  EXTREMITIES:  Without clubbing, cyanosis, or edema.  NEURO:  It is diminished secondary to sedation.   IMPRESSION:  1. Large pericardial effusion per CT.  2. 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  3. History of hypertension, on no medications.  4. Ongoing tobacco abuse.   PLAN:  A 60 year old African American female admitted with severe  abdominal pain  with perforated viscus per CT scan, status post colostomy  and colectomy with noted large pericardial effusion on CT scan.  On  examination, the patient does have elevated JVD at 3-4 cm, but no  paradoxical pulses at this time.   We will get 2-D echocardiogram to further evaluate the effusion and look  for signs of tamponade.  She may have mild hemodynamic compromise from  effusion evidence by mild elevation in JVD.  No immediate indication to  drain effusion.  We will wait on echo and path report and overall plan  for management of probable CA.      [name]. Lyman Bishop, NP      Everardo Beals. Juanda Chance, MD, [facility]  Electronically Signed    KML/MEDQ  D:  10/27/2008  T:  10/28/2008  Job:  [id number]

## 2010-11-30 NOTE — Consult Note (Signed)
[name], [name] NO.:  [id number]   MEDICAL RECORD NO.:  [id number]          PATIENT TYPE:  INP   LOCATION:  5152                         FACILITY:  [facility]   PHYSICIAN:  Samul Dada, M.D.DATE OF BIRTH:  Mar 30, 1951   DATE OF CONSULTATION:  10/30/2008  DATE OF DISCHARGE:                                 CONSULTATION   REQUESTING PHYSICIAN:  Velora Heckler, MD   REASON FOR CONSULTATION:  Colon cancer.   PATIENT IDENTIFICATION/HISTORY OF PRESENT ILLNESS:  [name] is  a 60 year old black female who was evaluated at [facility] on  October 25, 2008, due to sudden onset of chills and crampy abdominal pain  as well as intermittent diarrhea.  She states that the pain increased in  intensity and was associated with nausea and vomiting by the next day.  This prompted her to go to the emergency room and on evaluation she was  noted to be febrile with generalized abdominal tenderness, anemia as  well as leukocytosis.  She had plain films of the abdomen obtained which  revealed a small amount of free peritoneal air compatible with bowel  perforation as well as mild cardiomegaly and mild small bowel and  colonic ileus.  Followup CT scan of the abdomen and pelvis, in the  abdomen revealed severe wall thickening and enhancement in the hepatic  flexure with a rind of wall tissue measuring up to 3.1 cm on a single  side, possibly representing colon carcinoma, lymphoma or possibly a  gastrointestinal stromal tumor.  This appears to likely be the site of  perforation.  There may be an inflammatory component as well, with small  bowel loops along the lower margin of this mass appearing clean and  thick walled.  Mild malrotation of the bowel.  Mesenteric edema in the  upper abdomen.  Small amount of free intraperitoneal gas.  Large  pericardial effusion and 11 mm right lower lobe pulmonary nodule  suspicious for malignancy.  Small nonspecific splenic mass.   In the  pelvis swirled appearance of the mesentery in the right pelvis raising  the possibility of mild volvulus.  Complex fluid in the pelvis  compatible with blood or pus.  Uterine masses, likely uterine fibroids  although uterine malignancy is not totally excluded.  Several left-sided  thick walled loops of small bowel in the pelvis were noted.  Due to her  CT findings the patient was initiated on IV antibiotics with Zosyn and  transfused 2 units of packed red blood cells and was transferred to  [facility] for further treatment and management.  Of note,  during her evaluation at [facility] her stools were checked for blood  and they were Hemoccult negative.  Upon transfer to [facility] Dr. Avel Peace was consulted and on October 27, 2008, the patient underwent  exploratory laparotomy, left colectomy with mobilization of the splenic  flexure, transverse colostomy and partial omentectomy.  Surgical  pathology of her left segmental resection revealed invasive  adenocarcinoma, moderately differentiated spanning 9 cm with gross  perforation of the colonic wall.  Lymphovascular invasion is identified.  Metastatic adenocarcinoma in 1 of 20 lymph nodes.  Also on April 12 the  patient was evaluated by Dr. Charlies Constable, cardiologist, due to her  large pericardial effusion noted on her CT scan.  For further workup the  patient did have an echocardiogram on October 27, 2008, which revealed  estimated ejection fraction was in the range of 55-60%.  There was a  small pericardial effusion identified.  Per Dr. Regino Schultze consultation  note the patient had no immediate indication to drain the effusion.  Oncology consult was requested today for further evaluation and  management of the patient.   PAST MEDICAL/SURGICAL HISTORY.:  1. Tonsillectomy.  2. Tubal ligation.  3. History of right shoulder surgery.   MEDICATIONS:  None on admission.   ALLERGIES:  Which per the patient cause pruritus  and GI upset.   REVIEW OF SYSTEMS:  Positive for generalized fatigue as well as  intermittent abdominal pain.  Currently no dyspnea, nausea or vomiting.  No bleeding or swelling of extremities.   FAMILY HISTORY:  Positive history of breast cancer in her mother, father  with history of coronary artery disease and diabetes.  She has two sons,  ages 76 and 55, alive and well.   SOCIAL HISTORY:  The patient is single.  She works as a Designer, television/film set on  trucks.  She has a 30 pack year history of cigarette smoking and states  that she smokes currently a little less than a half pack per day.  She  occasionally uses alcohol.  No history of illicit drug use.   PHYSICAL EXAM:  VITAL SIGNS:  Temperature 98.3, heart rate 81,  respirations 16, blood pressure 138/64.  O2 saturation is 99% on room  air.  GENERAL:  This is a well-developed, well-nourished black female in no  acute distress.  HEENT:  Normocephalic.  Sclerae nonicteric.  There is no oral thrush or  mucositis.  She does have a nasogastric tube to low wall suction with  green drainage noted.  SKIN:  Without rashes or lesions.  There is a dressing dry and intact  under her left breast; also a dressing dry and intact to her mid  abdomen.  LYMPHS:  Reveals no peripheral lymphadenopathy.  CARDIAC:  Regular rate and rhythm without murmurs or gallops.  Peripheral pulses are 2+ bilaterally.  CHEST:  Lungs clear to auscultation bilaterally.  BREASTS:  Bilateral breast exam without masses or nipple discharge.  ABDOMEN:  Reveals hypoactive bowel sounds, soft with generalized  tenderness.  There is a right quadrant colostomy with no drainage  present.  There is also a drain present left quadrant with  serosanguineous drainage noted.  EXTREMITIES:  Reveal no edema, clubbing or cyanosis.  NEURO:  Alert and oriented x3 with some weakness noted.   LABS:  CBC reveals white blood count of 19.9, hemoglobin 10.7,  hematocrit 32.3, platelets of 323.   Chemistries reveal a sodium of 139,  potassium 4.2, chloride 105, BUN of 3, creatinine 0.62, glucose of 107.   IMPRESSION AND PLAN:  1. 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  We will obtain a CEA for      further workup.  The patient will also need a CT of the chest for      further staging as she did  have a pulmonary nodule noted on her CT      of the abdomen and pelvis on October 26, 2008.  2. A patient with anemia.  She apparently was Hemoccult negative.  Her      hemoglobin is stable status post transfusion of packed red blood      cells.  She does have low MCV and we will check iron studies for      further evaluation.  3. A patient with history of pericardial effusion, management per      cardiology, Dr. Charlies Constable.  4. Leukocytosis.  The patient continues on Zosyn.  5. For deep venous thrombosis prophylaxis the patient is on subcu      heparin.  6. Abdominal pain controlled with current pain medication regimen.   The patient is seen in consultation with Dr. [name] and plan of care  is formulated in consultation with Dr. [name].   Thank you for this consultation and allowing [place] to participate in the  care of this patient.      Sherilyn Banker, NP      Samul Dada, M.D.  Electronically Signed    RJ/MEDQ  D:  10/30/2008  T:  10/30/2008  Job:  [id number]

## 2010-11-30 NOTE — Op Note (Signed)
Kayla [name], Kayla [name] NO.:  [id number]   MEDICAL RECORD NO.:  [id number]          PATIENT TYPE:  INP   LOCATION:  2550                         FACILITY:  [facility]   PHYSICIAN:  [name], M.D.DATE OF BIRTH:  1951-05-25   DATE OF PROCEDURE:  10/27/2008  DATE OF DISCHARGE:                               OPERATIVE REPORT   PREOPERATIVE DIAGNOSIS:  Perforated viscus.   POSTOPERATIVE DIAGNOSES:  Perforated left colon lesion with peritonitis.   PROCEDURES:  Exploratory laparotomy, left colectomy with mobilization of  splenic flexure, transverse colostomy, and partial omentectomy.   SURGEON:  [name], MD   ASSISTANT:  Sandria Bales. Ezzard Standing, MD   ANESTHESIA:  General.   INDICATIONS:  A 60 year old female was transferred down for [facility] with a perforated viscus and peritonitis.  She had a large left  colon lesion on the CT scan and is now brought to the operating room.   TECHNIQUE:  She was brought to the operating room, placed supine on the  operating room table and general anesthetic was administered.  A Foley  catheter was inserted and nasogastric tube was inserted.  The abdominal  wall was sterilely prepped and draped.  A long midline incision was made  beginning just inferior to xiphoid process and extended below the  umbilicus toward the pubis by dividing the skin, subcutaneous tissue,  fascia, and peritoneum with electrocautery.  Upon entering the  peritoneal cavity, purulent fluid was noted and was evacuated with  suction.  The omentum was then retracted revealing more purulent fluid  and some fibrinous debris on the small bowel.  The small bowel was run  and there was fibrinous debris on the small bowel, but no evidence of  perforation.  The appendix was normal.  The sigmoid colon was soft.  The  left colon distal to the splenic flexure demonstrated a very firm hard  mass struck to the lateral sidewall.  There were no liver lesions.   The  gallbladder was normal.  There were no drop metastases in the pelvis.  There is some uterine fibroid tumors noted.   I chose a point in the mid transverse colon and using the GIA stapler  divided the colon.  I then entered the lesser sac and divided the  attachment between the transverse colon and stomach using the LigaSure  device.  I partially divided the mesentery as well.   I mobilized the sigmoid colon by dividing its lateral attachments and  then I identified the ureter and I tracked the ureter all the way up  until it was going posterior to the mass and I placed a vessel loop  around it.  I then dissected the mass free from the lateral abdominal  wall using electrocautery and was using some blunt dissection.  I  continued to medialize the mass using some blunt dissection,  electrocautery and the LigaSure device keeping the ureter in  visualization at all times.  The mass was adherent to Gerota fascia, but  I was able to develop a plane between the mass and the kidney.  The mass  was also adherent to the pancreas and I dissected some of the pancreas  including a small amount of the tail of the pancreas.  I then oversewed  the tail of the pancreas with interrupted Vicryl sutures, which had some  bleeding.  I then was able to divide the rest of the mesentery and using  the LigaSure device and I also  end up dividing the left gonadal artery  because it was intimate with the mass and it was ligated.  Indigo  carmine was given and there was no evidence of leak at the intact  ureter.  The mass was then taken off, the mesh was then excised as the  mesentery was divided and the proximal portion marked with a suture and  it was sent to Pathology.  Part of the omentum was also included with  the mass.   Following this, then I put laparotomy pads in the left gutter area and  examined the spleen as there was reported to be a 14-mm lesion of the  spleen, superior pole, but I could not  feel this or identify  definitively.  I then copiously irrigated out the right upper quadrant,  left upper quadrant, pelvis, and left lower quadrant with saline  solution.  Upon removing laparotomy pads in the left upper quadrant,  hemostasis was adequate.  A piece of Surgicel was placed around the tail  of the pancreas in the dissection bed in the left gutter.   I requested a sponge count at this time and it was reported to be  correct.  I then made a stab incision in the left lower quadrant and  brought a 19-Blake Penrose drain into the abdominal cavity, placed the  drain near the tail of the pancreas.  The drain was anchored to the skin  with 3-0 nylon suture.  A circular incision was made in the right upper  quadrant skin and then a cruciate incision was made in the anterior and  posterior fascial layers.  The transverse colon stapled off, stump was  then brought up through the circular incision and anchored in 4  quadrants, the anterior fascia with 2-0 Vicryl suture.   At this time, hemostasis was adequate.  I then closed the fascia with a  running double-looped PDS suture.  This incision was covered with a  towel.  The colostomy was matured with interrupted 3-0 Vicryl sutures in  an everted fashion.  A colostomy appliance was applied.   I then left the skin of the laparotomy wound open and packed with moist  gauze followed by a dry dressing.   She tolerated the procedure well without any apparent complications and  was taken to the recovery room in satisfactory condition.  She will be  going to the intensive care unit from there.       [name], M.D.  Electronically Signed     TJR/MEDQ  D:  10/27/2008  T:  10/27/2008  Job:  [id number]

## 2010-11-30 NOTE — Discharge Summary (Signed)
NAMEBAYLOR, TEEGARDEN NO.:  [id number]   MEDICAL RECORD NO.:  [id number]          PATIENT TYPE:  INP   LOCATION:  5152                         FACILITY:  [facility]   PHYSICIAN:  Ardeth Sportsman, MD     DATE OF BIRTH:  Dec 04, 1950   DATE OF ADMISSION:  10/26/2008  DATE OF DISCHARGE:  11/04/2008                               DISCHARGE SUMMARY   CONSULTATIONS:  Samul Dada, MD, Oncology.   CHIEF COMPLAINT AND REASON FOR ADMISSION:  Ms. Kopinski is a 60 year old  female patient otherwise healthy who presented to the [facility] ER on  October 25, 2008 with abdominal complaints.  Plain x-rays revealed a  pneumoperitoneum as well as a splenic flexure mass noted.  She was also  found to have a moderate-to-large pericardial effusion based on CT.  Therefore, she was transferred to [facility] for further evaluation.  Dr.  Abbey Chatters accepted the patient.  Upon arrival, the patient appeared to  be slightly ill with a temperature of 100 degrees, BP 124/68, pulse 96,  and respirations 16.  She was saturating 98% on room air.  Her abdomen  with diffuse tenderness and guarding to palpation and percussion.  No  obvious masses or hernias.  The patient's hemoglobin was noted to be 7.5  and she apparently received some red blood cells.  Prior to transfer,  her white count 13,900.  CT scan was once again reviewed with the  radiologist and it was felt the patient had a perforated viscus.   ADMITTING DIAGNOSES:  1. Perforated viscus with associated questionable splenic flexure      mass, most likely colonic perforation.  2. Large asymptomatic pericardial effusion.  3. Right pulmonary nodule.  4. Anemia.  5. Splenic lesion.   HOSPITAL COURSE:  The patient was admitted.  Prior to going to the OR,  Dr. Abbey Chatters discussed the significance of the pericardial effusion  with Dr. Evelene Croon of Thoracic Surgery.  Given the fact that she was  asymptomatic, he felt like she would not need the  pericardial window  procedure unless she shows signs of tamponade during surgery.  The  patient was subsequently taken to the OR by Dr. Abbey Chatters where she  underwent exploratory laparotomy for a perforated left colon with  obvious peritonitis.  She underwent a left colectomy with mobilization  of the splenic flexure, transverse colostomy, and partial laminectomy.  In the immediate postop period because of the question of pericardial  effusion and possible evolution and to tamponade, especially given the  fact now the patient has what appears to be malignancy and this could be  a malignant effusion, the patient was sent to the Critical Care Unit in  the immediate postop period.  Also Cardiology consult was obtained to  assist with further workup with pericardial effusion.  Please note, the  patient was evaluated by Dr. Charlies Constable.   The patient did well in the immediate postop period.  She did have  decreased urinary output from volume shifting and volume depletion.  Although blood pressure was stable at 150/170, she received fluid  challenges  but otherwise had an uneventful critical care course.  A 2-D  echocardiogram was ordered by Cardiology that showed a small pericardial  effusion but she remained asymptomatic and Cardiology eventually signed  off the case.  Pathology returned for moderately differentiated invasive  adenocarcinoma.  The mass was 9 cm in size with associated perforated  colonic wall and one of 20 lymph nodes were positive.  This prompted an  Oncology consult.  Dr. [name] evaluated the patient and plans to  followup with the patient in the outpatient setting.  Just prior to  discharge, the patient did undergo a CT of the chest.  Her CEA was also  elevated at 14.7.   From a surgical standpoint, the patient did well.  Because of the  peritonitis preoperatively, her abdominal incision was left open.  She  had a colostomy which was stable without evidence of  necrosis.  She was  passing liquid stool by date of discharge.  She had undergone teaching  regarding wound and ostomy care by the ostomy care RN and both wounds  and ostomy will be followed as an outpatient per home health RN until  patient is able to manage these by herself.  By postop day #4, the  patient's NG tube was discontinued and over the next several days her  diet was advanced.  She also had leukocytosis as noted at admission with  a peak in white count to 21,300.  By the date of discharge, her white  count had trended down to 16,000.  She has been on Zosyn during the  entire hospitalization.  This was postop day #7 on date of discharge and  due to frank contamination and peritonitis, we will go ahead and  continue Augmentin after discharge for three more days, given the fact  she still was having leukocytosis without fever or abdominal pain prior  to discharge.  Please note that on date of discharge, the patient's  abdominal wound was pink and granular with no signs of infection.  Her  white count was 16,500, hemoglobin 9.5, and platelets 504,000.  Sodium  141, potassium 3.7, BUN 3, and creatinine 0.67.  She was afebrile, vital  signs were stable, and colostomy was functioning.   FINAL DISCHARGE DIAGNOSES:  1. Abdominal pain and acute abdomen secondary to perforated colonic      viscus.  2. 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 Abbey Chatters.  3. Anemia of malignancy.  4. Leukocytosis, improving.   DISCHARGE MEDICATIONS:  1. Percocet 5/325 one to two tablets every 4 hours as needed for pain.  2. Over-the-counter ibuprofen as directed on the bottle in addition to      Percocet.  3. Augmentin 875 mg b.i.d. for 3 days.   WORK INSTRUCTIONS:  Return to work in 6 weeks or as directed per Dr.  [name] pending additional therapy for cancer treatment.   DIET:  No restrictions.   WOUND CARE:  1. Normal saline packing to open  areas of abdominal wound twice daily.      Home Health RN to follow.  2. Routine colostomy care.  Home Health RN to follow.   ACTIVITY:  Increase activity slowly.  May walk up steps.  May shower.  No lifting more than 15 pounds for 6 weeks.  No driving for 2 weeks.   FOLLOWUP APPOINTMENTS:  The patient needs to call Dr. Maris Berger  office at [phone] to be seen in 2  weeks.  She needs to call Dr.  Mamie Levers office or they will call her regarding a followup for  additional cancer therapy.  Their telephone number is [phone].      Allison L. Eliott Nine, MD  Electronically Signed    ALE/MEDQ  D:  11/04/2008  T:  11/04/2008  Job:  [id number]   cc:   [name], M.D.  Samul Dada, M.D.

## 2010-11-30 NOTE — Op Note (Signed)
NAMETIMMIA, COGBURN               ACCOUNT NO.:  [id number]   MEDICAL RECORD NO.:  [id number]          PATIENT TYPE:  AMB   LOCATION:  SDS                          FACILITY:  [facility]   PHYSICIAN:  [name], M.D.DATE OF BIRTH:  [birth year]   DATE OF PROCEDURE:  12/10/2008  DATE OF DISCHARGE:  12/10/2008                               OPERATIVE REPORT   PREOPERATIVE DIAGNOSIS:  Colon cancer.   POSTOPERATIVE DIAGNOSIS:  Colon cancer.   PROCEDURE:  Ultrasound-guided Port-A-Cath insertion with fluoroscopic  confirmation.   SURGEON:  [name], MD   ANESTHESIA:  Local (lidocaine) with MAC.   INDICATIONS:  This is a 60 year old female with stage III colon cancer.  She requires long-term venous access for chemotherapy and now presents  for that.  We discussed the procedure and risks preoperatively.   TECHNIQUE:  She was brought to the operating room, placed supine on the  operating table, and given intravenous sedation.  A roll was placed on  to the back.  Initially, I used ultrasound just to briefly map the area  of the right neck.  The neck and the upper chest were then sterilely  prepped and draped.   The head was turned to the left.  Local anesthetic was infiltrated at  the apex formed of the triangle of the 2 sternocleidomastoid muscle  heads.  Using ultrasound, I then identified the carotid artery and then  the internal jugular artery.  First cannulation was of the carotid  artery and I removed the needle and applied pressure for 5 minutes.  No  expanding hematoma was noted.  Next, I had to perform a number attempted  cannulations as I could get to the vein, but could not sustain  aspiration of blood.  Eventually, I changed the angulation of the needle  with the skin, I was able to sustain aspiration of blood, and passed a  wire into the right internal jugular vein, then to the superior vena  cava, which was verified by fluoroscopy.   Following this, I  anesthetized the area around the wire.  I then  anesthetized an area on the right upper chest wall, both superficially  and deep.  An incision was made through the skin and subcutaneous tissue  and using electrocautery, a pocket was created for the port.  I then  anesthetized in the subcutaneous tissue a path from the upper to lower  incision.  Using a tendon passer, I brought the catheter up from the  chest wall incision up to the neck incision.  The neck incision was then  enlarged.  The vein was dilated, then the dilator introducer complex was  introduced over the wire.  The wire and the dilator were removed, and a  catheter was threaded through the peel-away sheath introducer.  The peel-  away sheath introducer was then removed.   Under fluoroscopic guidance, I then pulled the catheter back until the  tip was at the superior vena cava right atrial junction.  The catheter  was then cut at the chest wall incision.  The port threaded onto it.  The port was able to aspirate blood and flush easily.  The port was then  anchored to the chest wall with 2-0 Vicryl suture.   At this point, hemostasis was adequate.  I closed the skin incision and  neck with a 4-0 Monocryl subcuticular stitch.  Chest wall incision was  closed in 2 layers.  The subcutaneous tissue was reapproximated with a  running 2-0 Vicryl suture.  The skin was closed with 4-0 Monocryl  subcuticular stitch.  Steri-Strips were applied.  I then placed an  angled Huber needle into the port and then placed concentrated solution  in this.  Sterile dressings were applied.  She is due to receive  chemotherapy later today.   She tolerated the procedure without any apparent complications and was  taken to recovery room in satisfactory condition where a portable chest  x-ray is pending.      [name], M.D.  Electronically Signed     TJR/MEDQ  D:  12/10/2008  T:  12/10/2008  Job:  [id number]

## 2010-12-07 ENCOUNTER — Other Ambulatory Visit (HOSPITAL_COMMUNITY): Payer: Self-pay | Admitting: Oncology

## 2010-12-07 ENCOUNTER — Encounter (HOSPITAL_BASED_OUTPATIENT_CLINIC_OR_DEPARTMENT_OTHER): Payer: Medicaid Other | Admitting: Oncology

## 2010-12-07 DIAGNOSIS — C801 Malignant (primary) neoplasm, unspecified: Secondary | ICD-10-CM

## 2010-12-07 DIAGNOSIS — C186 Malignant neoplasm of descending colon: Secondary | ICD-10-CM

## 2010-12-07 DIAGNOSIS — C7800 Secondary malignant neoplasm of unspecified lung: Secondary | ICD-10-CM

## 2010-12-07 LAB — CBC WITH DIFFERENTIAL/PLATELET
BASO%: 0.4 % (ref 0.0–2.0)
EOS%: 1.8 % (ref 0.0–7.0)
HCT: 40.1 % (ref 34.8–46.6)
LYMPH%: 23.6 % (ref 14.0–49.7)
MCH: 27.8 pg (ref 25.1–34.0)
MCHC: 33 g/dL (ref 31.5–36.0)
MCV: 84.1 fL (ref 79.5–101.0)
MONO#: 0.4 10*3/uL (ref 0.1–0.9)
MONO%: 5.9 % (ref 0.0–14.0)
NEUT%: 68.3 % (ref 38.4–76.8)
Platelets: 203 10*3/uL (ref 145–400)
RBC: 4.77 10*6/uL (ref 3.70–5.45)

## 2010-12-07 LAB — COMPREHENSIVE METABOLIC PANEL
AST: 34 U/L (ref 0–37)
BUN: 12 mg/dL (ref 6–23)
Calcium: 9.7 mg/dL (ref 8.4–10.5)
Chloride: 108 mEq/L (ref 96–112)
Creatinine, Ser: 0.79 mg/dL (ref 0.40–1.20)
Total Bilirubin: 0.8 mg/dL (ref 0.3–1.2)

## 2010-12-07 LAB — IRON AND TIBC
Iron: 87 ug/dL (ref 42–145)
TIBC: 361 ug/dL (ref 250–470)
UIBC: 274 ug/dL

## 2011-01-10 ENCOUNTER — Encounter: Payer: Self-pay | Admitting: Cardiology

## 2011-02-01 ENCOUNTER — Encounter (HOSPITAL_BASED_OUTPATIENT_CLINIC_OR_DEPARTMENT_OTHER): Payer: Self-pay | Admitting: Oncology

## 2011-02-01 ENCOUNTER — Other Ambulatory Visit (HOSPITAL_COMMUNITY): Payer: Self-pay | Admitting: Oncology

## 2011-02-01 DIAGNOSIS — C186 Malignant neoplasm of descending colon: Secondary | ICD-10-CM

## 2011-02-01 DIAGNOSIS — C3490 Malignant neoplasm of unspecified part of unspecified bronchus or lung: Secondary | ICD-10-CM

## 2011-02-01 DIAGNOSIS — C189 Malignant neoplasm of colon, unspecified: Secondary | ICD-10-CM

## 2011-02-01 DIAGNOSIS — C7800 Secondary malignant neoplasm of unspecified lung: Secondary | ICD-10-CM

## 2011-02-01 DIAGNOSIS — J984 Other disorders of lung: Secondary | ICD-10-CM

## 2011-02-01 DIAGNOSIS — C801 Malignant (primary) neoplasm, unspecified: Secondary | ICD-10-CM

## 2011-02-01 LAB — COMPREHENSIVE METABOLIC PANEL
ALT: 23 U/L (ref 0–35)
AST: 28 U/L (ref 0–37)
Albumin: 4 g/dL (ref 3.5–5.2)
Alkaline Phosphatase: 109 U/L (ref 39–117)
BUN: 14 mg/dL (ref 6–23)
CO2: 23 mEq/L (ref 19–32)
Calcium: 9.5 mg/dL (ref 8.4–10.5)
Chloride: 106 mEq/L (ref 96–112)
Creatinine, Ser: 0.83 mg/dL (ref 0.50–1.10)
Glucose, Bld: 97 mg/dL (ref 70–99)
Potassium: 4.2 mEq/L (ref 3.5–5.3)
Sodium: 140 mEq/L (ref 135–145)
Total Bilirubin: 0.7 mg/dL (ref 0.3–1.2)
Total Protein: 7 g/dL (ref 6.0–8.3)

## 2011-02-01 LAB — CBC WITH DIFFERENTIAL/PLATELET
BASO%: 0.3 % (ref 0.0–2.0)
Basophils Absolute: 0 10*3/uL (ref 0.0–0.1)
HCT: 40.5 % (ref 34.8–46.6)
HGB: 13.6 g/dL (ref 11.6–15.9)
MONO#: 0.5 10*3/uL (ref 0.1–0.9)
NEUT#: 4.5 10*3/uL (ref 1.5–6.5)
NEUT%: 65.1 % (ref 38.4–76.8)
WBC: 6.9 10*3/uL (ref 3.9–10.3)
lymph#: 1.8 10*3/uL (ref 0.9–3.3)

## 2011-02-01 LAB — LACTATE DEHYDROGENASE: LDH: 144 U/L (ref 94–250)

## 2011-03-22 ENCOUNTER — Other Ambulatory Visit: Payer: Self-pay

## 2011-03-22 ENCOUNTER — Emergency Department (HOSPITAL_COMMUNITY)
Admission: EM | Admit: 2011-03-22 | Discharge: 2011-03-23 | Disposition: A | Discharge status: Other Acute Inpatient Hospital | Payer: Medicaid Other | Source: Home / Self Care | Attending: Emergency Medicine | Admitting: Emergency Medicine

## 2011-03-22 ENCOUNTER — Encounter (HOSPITAL_COMMUNITY): Payer: Self-pay

## 2011-03-22 ENCOUNTER — Emergency Department (HOSPITAL_COMMUNITY): Payer: Medicaid Other

## 2011-03-22 DIAGNOSIS — I10 Essential (primary) hypertension: Secondary | ICD-10-CM | POA: Insufficient documentation

## 2011-03-22 DIAGNOSIS — R29898 Other symptoms and signs involving the musculoskeletal system: Secondary | ICD-10-CM | POA: Insufficient documentation

## 2011-03-22 DIAGNOSIS — G939 Disorder of brain, unspecified: Secondary | ICD-10-CM

## 2011-03-22 DIAGNOSIS — R209 Unspecified disturbances of skin sensation: Secondary | ICD-10-CM | POA: Insufficient documentation

## 2011-03-22 DIAGNOSIS — Z882 Allergy status to sulfonamides status: Secondary | ICD-10-CM | POA: Insufficient documentation

## 2011-03-22 DIAGNOSIS — Z85038 Personal history of other malignant neoplasm of large intestine: Secondary | ICD-10-CM | POA: Insufficient documentation

## 2011-03-22 DIAGNOSIS — Z87891 Personal history of nicotine dependence: Secondary | ICD-10-CM | POA: Insufficient documentation

## 2011-03-22 DIAGNOSIS — Z9851 Tubal ligation status: Secondary | ICD-10-CM | POA: Insufficient documentation

## 2011-03-22 DIAGNOSIS — G9389 Other specified disorders of brain: Secondary | ICD-10-CM | POA: Insufficient documentation

## 2011-03-22 DIAGNOSIS — R531 Weakness: Secondary | ICD-10-CM

## 2011-03-22 HISTORY — DX: Essential (primary) hypertension: I10

## 2011-03-22 HISTORY — DX: Malignant neoplasm of colon, unspecified: C18.9

## 2011-03-22 LAB — CBC
Hemoglobin: 14.4 g/dL (ref 12.0–15.0)
MCH: 27.9 pg (ref 26.0–34.0)
MCV: 84.7 fL (ref 78.0–100.0)
RBC: 5.17 MIL/uL — ABNORMAL HIGH (ref 3.87–5.11)
WBC: 8.8 10*3/uL (ref 4.0–10.5)

## 2011-03-22 LAB — DIFFERENTIAL
Eosinophils Absolute: 0.1 10*3/uL (ref 0.0–0.7)
Eosinophils Relative: 1 % (ref 0–5)
Lymphocytes Relative: 25 % (ref 12–46)
Lymphs Abs: 2.2 10*3/uL (ref 0.7–4.0)
Monocytes Relative: 4 % (ref 3–12)

## 2011-03-22 LAB — COMPREHENSIVE METABOLIC PANEL
ALT: 36 U/L — ABNORMAL HIGH (ref 0–35)
Alkaline Phosphatase: 131 U/L — ABNORMAL HIGH (ref 39–117)
BUN: 13 mg/dL (ref 6–23)
CO2: 23 mEq/L (ref 19–32)
GFR calc Af Amer: >60 mL/min (ref >60)
GFR calc non Af Amer: >60 mL/min (ref >60)
Glucose, Bld: 104 mg/dL — ABNORMAL HIGH (ref 70–99)
Potassium: 4.3 mEq/L (ref 3.5–5.1)
Total Protein: 7.4 g/dL (ref 6.0–8.3)

## 2011-03-22 MED ORDER — ASPIRIN 325 MG PO TABS
325.0000 mg | ORAL_TABLET | Freq: Once | ORAL | Status: DC
  Filled 2011-03-22: qty 1

## 2011-03-22 MED ORDER — DEXAMETHASONE SODIUM PHOSPHATE 10 MG/ML IJ SOLN
10.0000 mg | Freq: Once | INTRAMUSCULAR | Status: AC
  Administered 2011-03-22: 10 mg via INTRAVENOUS
  Filled 2011-03-22: qty 1

## 2011-03-22 NOTE — ED Notes (Signed)
Pt states her right arm and right leg started feeling weak and numb on Sunday and has worsened since then, today being worse,  Is able to walk, denies speech problems or facial droop or facial numbness.

## 2011-03-22 NOTE — ED Provider Notes (Addendum)
History  Scribed for Dr. Patria Mane, the patient was seen in room 18. The chart was scribed by Gilman Schmidt. The patients care was started at 2021.  CSN: [id number] Arrival date & time: 03/22/2011  7:27 PM  Chief Complaint  Patient presents with  . Extremity Weakness    right side weak/numb x 2 days   The history is provided by the patient.   Kayla [name] is a 60 y.o. female who presents to the Emergency Department complaining of extremity weakness on the right side. Patient reports that her right leg and right arm have been feeling numb for the past 2-3 days. Associated symptoms of clumsiness. Patient denies any numbness in face. There are no other associated symptoms and no other alleviating or aggravating factors. No headache. No slurred speech or facial asymetry. No CP or neck pain or SOB. Hx of colon CA s/p resection in 2010  HPI ELEMENTS:  Location: right upper and lower extremities  Onset: 2-3 days ago Duration: consistent since onset  Timing: constant  Quality: numbness Context: as above  Associated symptoms: numbness, clumsiness but denies any facial numbness   PAST MEDICAL HISTORY:  Past Medical History  Diagnosis Date  . Pericardial effusion     echo 08/13/09  . LVH (left ventricular hypertrophy)     mod/severe. echo 1/11. EF 65-70%   . Hypertension   . Colon cancer     colon/ 2010/surg/chemo     PAST SURGICAL HISTORY:  Past Surgical History  Procedure Date  . Rotator cuff repair   . Tonsillectomy   . Tubal ligation      MEDICATIONS:  Previous Medications   ACETAMINOPHEN (TYLENOL) 650 MG CR TABLET    Take 1,300 mg by mouth once as needed. For pain    AMLODIPINE (NORVASC) 5 MG TABLET    Take 5 mg by mouth daily.     FERROUS SULFATE DRIED (KP FERROUS SULFATE) 202 (65 FE) MG TABS    Take 1 tablet by mouth daily.     GABAPENTIN (NEURONTIN) 300 MG CAPSULE    Take 300 mg by mouth at bedtime.    LISINOPRIL (PRINIVIL,ZESTRIL) 40 MG TABLET    Take 40 mg by mouth every  morning.      ALLERGIES:  Allergies as of 03/22/2011 - Review Complete 03/22/2011  Allergen Reaction Noted  . Sulfonamide derivatives Rash      FAMILY HISTORY:  History reviewed. No pertinent family history.   SOCIAL HISTORY: History  Substance Use Topics  . Smoking status: Former Games developer  . Smokeless tobacco: Not on file   Comment: 30 pack year hx   . Alcohol Use: Yes     occasinal      Review of Systems  Neurological: Positive for weakness and numbness. Negative for facial asymmetry.  Psychiatric/Behavioral: Negative for confusion.  All other systems reviewed and are negative.    Physical Exam  BP 149/84  Pulse 83  Resp 20  Ht 5\' 4"  (1.626 m)  Wt 175 lb (79.379 kg)  BMI 30.04 kg/m2  SpO2 98%  Physical Exam  Constitutional: She is oriented to person, place, and time. She appears well-developed and well-nourished.  HENT:  Head: Normocephalic.  Eyes: EOM are normal. Pupils are equal, round, and reactive to light.  Neck: Normal range of motion and phonation normal.  Cardiovascular: Normal rate, regular rhythm, normal heart sounds and intact distal pulses.   Pulmonary/Chest: Effort normal and breath sounds normal. She exhibits no bony tenderness.  Abdominal: Soft.  Normal appearance. There is no tenderness.  Musculoskeletal: Normal range of motion.  Neurological: She is alert and oriented to person, place, and time. She has normal strength. No cranial nerve deficit or sensory deficit. She exhibits normal muscle tone. Coordination normal.       Right Side Pronator Drift Finger to Nose Off  5/5 Strength in Bilateral Upper Extremity 5-/5 Strength in Right Leg   Skin: Skin is warm, dry and intact.  Psychiatric: She has a normal mood and affect. Her behavior is normal. Judgment and thought content normal.    . OTHER DATA REVIEWED: Nursing notes, vital signs, and past medical records reviewed.   DIAGNOSTIC STUDIES: Oxygen Saturation is 98% on room air, normal by  my interpretation.     Labs:     RADIOLOGY:  CT Head Scan IMPRESSION: Large amount of vasogenic edema in the left hemisphere compatible with metastatic disease. Follow-up MRI of the brain with contrast is suggested. Original Report Authenticated By: Camelia Phenes, M.D.  MDM:  Pt with new vasogenic edema on left hemisphere, likely mets from her hx of colon CA. Mild weakness on right. Mild mid line shift. IV decadron now. Spoke with heme onc who agrees with transfer to [facility] for admission. Doubt NSU intervention. Pt and family understand. [facility] to do evaluation and transfer orders, then transfer to [facility]  IMPRESSION: Diagnoses that have been ruled out:  Diagnoses that are still under consideration:  Final diagnoses:  Brain lesion  Weakness of right side of body    PLAN:  Transfer to [facility]   MEDICATIONS GIVEN IN THE E.D.  Medications  acetaminophen (TYLENOL) 650 MG CR tablet (not administered)  dexamethasone (DECADRON) injection 10 mg (10 mg Intravenous Given 03/22/11 2042)    DISCHARGE MEDICATIONS: New Prescriptions   No medications on file    SCRIBE ATTESTATION:  I personally performed the services described in this documentation, which was scribed in my presence. The recorded information has been reviewed and considered. Lyanne Co, MD   Procedures      Lyanne Co, MD 03/22/11 2127   Date: 03/22/2011  Rate: 79  Rhythm: normal sinus rhythm  QRS Axis: normal  Intervals: normal  ST/T Wave abnormalities: normal  Conduction Disutrbances:none  Narrative Interpretation:   Old EKG Reviewed: unchanged    Lyanne Co, MD 03/22/11 2131

## 2011-03-23 ENCOUNTER — Inpatient Hospital Stay (HOSPITAL_COMMUNITY): Payer: Medicaid Other

## 2011-03-23 ENCOUNTER — Inpatient Hospital Stay (HOSPITAL_COMMUNITY)
Admission: EM | Admit: 2011-03-23 | Discharge: 2011-03-24 | DRG: 054 | Disposition: A | Discharge status: Home Health | Payer: Medicaid Other | Source: Other Acute Inpatient Hospital | Attending: Internal Medicine | Admitting: Internal Medicine

## 2011-03-23 DIAGNOSIS — G936 Cerebral edema: Secondary | ICD-10-CM | POA: Diagnosis present

## 2011-03-23 DIAGNOSIS — I10 Essential (primary) hypertension: Secondary | ICD-10-CM | POA: Diagnosis present

## 2011-03-23 DIAGNOSIS — C7931 Secondary malignant neoplasm of brain: Secondary | ICD-10-CM

## 2011-03-23 DIAGNOSIS — R29898 Other symptoms and signs involving the musculoskeletal system: Secondary | ICD-10-CM | POA: Diagnosis present

## 2011-03-23 DIAGNOSIS — R7309 Other abnormal glucose: Secondary | ICD-10-CM | POA: Diagnosis present

## 2011-03-23 DIAGNOSIS — M545 Low back pain, unspecified: Secondary | ICD-10-CM | POA: Diagnosis present

## 2011-03-23 DIAGNOSIS — C7949 Secondary malignant neoplasm of other parts of nervous system: Secondary | ICD-10-CM

## 2011-03-23 DIAGNOSIS — C189 Malignant neoplasm of colon, unspecified: Secondary | ICD-10-CM

## 2011-03-23 DIAGNOSIS — T451X5A Adverse effect of antineoplastic and immunosuppressive drugs, initial encounter: Secondary | ICD-10-CM | POA: Diagnosis present

## 2011-03-23 DIAGNOSIS — C7800 Secondary malignant neoplasm of unspecified lung: Secondary | ICD-10-CM | POA: Diagnosis present

## 2011-03-23 DIAGNOSIS — Z79899 Other long term (current) drug therapy: Secondary | ICD-10-CM

## 2011-03-23 DIAGNOSIS — G620 Drug-induced polyneuropathy: Secondary | ICD-10-CM | POA: Diagnosis present

## 2011-03-23 LAB — CBC
Hemoglobin: 13.5 g/dL (ref 12.0–15.0)
MCH: 27.6 pg (ref 26.0–34.0)
MCHC: 32.8 g/dL (ref 30.0–36.0)
Platelets: 223 10*3/uL (ref 150–400)
RDW: 15.2 % (ref 11.5–15.5)

## 2011-03-23 LAB — LACTATE DEHYDROGENASE: LDH: 236 U/L (ref 94–250)

## 2011-03-23 LAB — BASIC METABOLIC PANEL
Calcium: 9.7 mg/dL (ref 8.4–10.5)
GFR calc Af Amer: >60 mL/min (ref >60)
GFR calc non Af Amer: >60 mL/min (ref >60)
Glucose, Bld: 159 mg/dL — ABNORMAL HIGH (ref 70–99)
Potassium: 3.8 mEq/L (ref 3.5–5.1)
Sodium: 138 mEq/L (ref 135–145)

## 2011-03-23 LAB — GLUCOSE, CAPILLARY
Glucose-Capillary: 207 mg/dL — ABNORMAL HIGH (ref 70–99)
Glucose-Capillary: 153 mg/dL — ABNORMAL HIGH (ref 70–99)

## 2011-03-23 LAB — CEA: CEA: 24.7 ng/mL — ABNORMAL HIGH (ref 0.0–5.0)

## 2011-03-23 MED ORDER — GADOBENATE DIMEGLUMINE 529 MG/ML IV SOLN
20.0000 mL | Freq: Once | INTRAVENOUS | Status: AC | PRN
  Administered 2011-03-23: 20 mL via INTRAVENOUS

## 2011-03-23 NOTE — H&P (Signed)
Kayla [name], SULAK NO.:  [id number]  MEDICAL RECORD NO.:  [id number]  LOCATION:  APA18                         FACILITY:  [facility]  PHYSICIAN:  Osvaldo Shipper, MD     DATE OF BIRTH:  [birth year]  DATE OF ADMISSION:  03/22/2011 DATE OF DISCHARGE:                             HISTORY & PHYSICAL   ONCOLOGIST:  Samul Dada, M.D.  PRIMARY CARE PHYSICIAN:  She does not have a primary care physician.  ADMISSION DIAGNOSES: 1. Newly detected brain tumor, possibly metastatic. 2. History of colon cancer. 3. History of hypertension. 4. Left lower back pain.  CHIEF COMPLAINT:  Back pain and right-sided numbness.  HISTORY OF PRESENT ILLNESS:  The patient is a 60 year old African American female with a history that is significant for colon cancer, diagnosed in 2010.  She underwent surgery at [facility].  She had a colostomy that was reversed earlier this year by Dr. Abbey Chatters.  She had chemo through the middle part of 2011.  Then, she supposedly has been tumor-free.  The patient tells me that she was in her usual state of health about 2 days ago when she started noticing that her left lower back was hurting.  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  Also, she has been having difficulty walking and she noted that her right side has been weak and been numb for the last few days.  The pain has been insidious in nature and got worse earlier today when it was 8/10 in intensity, currently she does not have any pain in the left lower back.  She denies any difficulty swallowing.  She has headache this morning, but none currently.  She denies any weight loss, denies any dysuria, denies any falls or trauma to the back, denies any history of kidney stones.  She has been able to walk, but favors the right side.  MEDICATIONS AT HOME: 1. Tylenol that she took today for her pain. 2. Gabapentin 300 mg daily. 3.  Lisinopril 40 mg daily.  ALLERGIES:  SULFA, which causes hives.  PAST MEDICAL HISTORY: 1. Positive significantly for colon cancer, diagnosed in 2010,     underwent surgery in 2010 with colostomy. 2. The colostomy was reversed in 2012. 3. She had chemotherapy to the middle of 2011. 4. Did not have any radiation. 5. She also has a history of hypertension. 6. 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 7. Denies any history of diabetes or stroke.  SOCIAL HISTORY:  She lives in [place] by herself.  Her son sometimes lives with her.  She quit smoking in 2010.  Occasional alcohol use.  No illicit drug use.  Independent with daily activities.  FAMILY HISTORY:  Positive for heart disease, diabetes, and cancer.  REVIEW OF SYSTEMS:  GENERAL:  Positive for weakness.  HEENT: Unremarkable.  CARDIOVASCULAR:  Unremarkable.  GI: Unremarkable.  GU: Unremarkable.  NEUROLOGIC:  As in HPI.  PSYCHIATRIC:  Unremarkable. MUSCULOSKELETAL:  As in HPI.  Other systems reviewed and found to be negative.  PHYSICAL EXAMINATION:  VITAL SIGNS:  Temperature 98.4,  blood pressure 149/84, heart rate 83, respiratory rate 20, saturation 98% on room air. GENERAL:  Slightly obese African American female in no distress, quite pleasant to talk to. HEENT:  Head is normocephalic, atraumatic.  Pupils are equal, reacting. No pallor.  No icterus.  Oral mucous membranes are moist.  No oral lesions are noted. NECK:  Supple. No thyromegaly is appreciated. LUNGS:  Clear to auscultation bilaterally with no wheezing, rales, or rhonchi. CARDIOVASCULAR:  S1, S2 is normal and regular.  No S3, S4, rubs, murmurs, or bruit. ABDOMEN:  Soft, nontender, and nondistended.  Bowel sounds are present. No masses or organomegaly appreciated. BACK:  Does not reveal any deformity.  There was no point tenderness. Straight leg raising test was negative.  GU:  Deferred. MUSCULOSKELETAL:  As mentioned  above. NEUROLOGIC:  She is alert, oriented x3.  No cranial nerve deficits are noted.  She does have subtle weakness of the right side compared to the left.  Strength is somewhere between 4/5 and 5/5 on the right side, the left side is 5/5.  She does have sensory deficits on the right as well with decreased sensation on the right side to sharp touch. SKIN:  Does not reveal any rashes.  LABORATORY DATA:  The only lab that is back right now is CBC, which pretty much is unremarkable.  BMET is still pending.  CT of the head did show large amount of vasogenic edema in the left hemisphere compatible with metastatic disease, however, a discrete lesion cannot be appreciated because of the edema.  EKG was done, which shows sinus rhythm at 79 with left axis deviation, normal intervals, no definite Q waves, poor R-wave progression, no acute ST- or T-wave changes are noted.  ASSESSMENT:  This is a 60 year old African American female who presents with left lower back pain and right-sided numbness and weakness and is found unfortunately to have possible brain lesion.  PLAN: 1. Brain lesion, possibly metastatic.  We will treat her with     dexamethasone.  We will give her Keppra for seizure prophylaxis for     now.  At least overnight tonight, we will monitor her in a tele unit at [facility].  Dr. [name] will be asked to see her in     the morning.  The oncologist has already been informed about the     patient.  She will likely require radiation treatment.  MRI of the     brain with contrast will be obtained. 2. Left lower back pain, etiology is unclear.  We will proceed with an     x-ray of the lumbar spine. 3. History of hypertension.  We will monitor her blood pressure     closely.  I will resume her lisinopril once her BMET comes back. 4. She will be kept on clear liquids tonight. 5. Continue with Neurontin. 6. She is a full code for now. 7. DVT prophylaxis with SCDs.  Further management  decisions will depend on results of further testing and patient's response to treatment. Please note, the patient will be transferred to [facility].  Intial plans were to send her to a stepdown unit but there were no beds available. It was felt that she was stable enough for tele. It was more important that she be at [facility].     Osvaldo Shipper, MD     GK/MEDQ  D:  03/22/2011  T:  03/22/2011  Job:  [id number]  cc:   Samul Dada, M.D. Fax: [phone]  Electronically Signed by Osvaldo Shipper MD on 03/23/2011 03:32:41 AM

## 2011-03-24 LAB — GLUCOSE, CAPILLARY: Glucose-Capillary: 135 mg/dL — ABNORMAL HIGH (ref 70–99)

## 2011-03-24 LAB — HEMOGLOBIN A1C: Hgb A1c MFr Bld: 6 % — ABNORMAL HIGH (ref <5.7)

## 2011-03-24 NOTE — Consult Note (Signed)
NAMENAIJAH, LACEK NO.:  [id number]  MEDICAL RECORD NO.:  [id number]  LOCATION:  1311                         FACILITY:  [facility]  PHYSICIAN:  [name]. [name], M.D.  DATE OF BIRTH:  07-05-1951  DATE OF CONSULTATION:  03/23/2011 DATE OF DISCHARGE:                                CONSULTATION   REASON FOR CONSULTATION:  Brain metastasis with a history of colon cancer.  HISTORY OF PRESENT ILLNESS:  Kayla [name] is a 60 year old right-handed woman with a history of colon cancer that was initially diagnosed in October 28, 2010.  She had a colectomy, subsequently had a colostomy and with subsequent reversal of colostomy and was found to have lung lesions.  At the time of her initial diagnosis, she was treated with chemotherapy.  Chest CT has been followed, and CEA has been low.  Chest CT from October 05, 2010, showed right lower chest lobe lesion of 4 mm, which was stable in size.  She was developing balance problems and right hemiparesis and was admitted to [facility] on April 01, 2011, and MRI of her brain was obtained, which shows a left parietal metastasis measuring 2.7 x 2.5 cm with a fairly significant amount of surrounding edema.  She was started on Decadron, has noted improvement in headache and also weakness, but feels that this has certainly not resolved.  Her CEA is also elevated to 25.  Head CT workup for her state of metastatic disease is pending.  On review of her MRI with Dr. Kathrynn Running from Radiation Oncology, we both feel the best approach is surgery to remove the mass lesion followed by postoperative stereotactic radiosurgery to the resection cavity.  PAST MEDICAL HISTORY:  Otherwise noncontributory.  REVIEW OF SYSTEMS:  Otherwise unremarkable.  She currently is not working.  PHYSICAL EXAMINATION:  Today, Kayla [name] is a pleasant cooperative woman in no acute distress.  She is awake, alert, and fully oriented.  Her face is symmetric.  Her  pupils are equal, round, reactive to light. Extraocular movements are intact.  Visual fields are full to confrontational testing.  She does have a right hemiparesis with a significant amount of right pronator drift.  She also has incoordination of her right upper extremity and lower extremity with poor spatial orientation and that she when trying to use her mobile phone, she used it upside-down.  She did a similar activity with the plug-in phone.  She dropped the phone.  She had difficulty using her fork.  Her balance was not tested, but she clearly has evidence of hemiparesis.  Reflexes are mildly brisk in the right side with upgoing great toe on the right, downgoing on the left.  She denies any numbness in her upper or lower extremities.  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  Her metastatic workup is pending.  I recommended continuing Decadron at 6 mg q.6 h. with a craniotomy, resection of the tumor followed by postoperative stereotactic radiosurgery.  The patient will need a Stealth MRI prior to surgery and will need to be transferred to [facility] for the surgery to be performed.  The  patient agrees and wishes to proceed.     [name]. [name], M.D.     JDS/MEDQ  D:  03/23/2011  T:  03/24/2011  Job:  [id number]

## 2011-03-25 ENCOUNTER — Encounter (HOSPITAL_COMMUNITY)
Admission: RE | Admit: 2011-03-25 | Discharge: 2011-03-25 | Disposition: A | Discharge status: Home / Self Care | Payer: Medicaid Other | Source: Ambulatory Visit | Attending: Oncology | Admitting: Oncology

## 2011-03-25 ENCOUNTER — Other Ambulatory Visit (HOSPITAL_COMMUNITY): Payer: Self-pay | Admitting: Neurosurgery

## 2011-03-25 DIAGNOSIS — C7931 Secondary malignant neoplasm of brain: Secondary | ICD-10-CM | POA: Insufficient documentation

## 2011-03-25 DIAGNOSIS — I319 Disease of pericardium, unspecified: Secondary | ICD-10-CM | POA: Insufficient documentation

## 2011-03-25 DIAGNOSIS — D739 Disease of spleen, unspecified: Secondary | ICD-10-CM | POA: Insufficient documentation

## 2011-03-25 DIAGNOSIS — D259 Leiomyoma of uterus, unspecified: Secondary | ICD-10-CM | POA: Insufficient documentation

## 2011-03-25 DIAGNOSIS — C189 Malignant neoplasm of colon, unspecified: Secondary | ICD-10-CM | POA: Insufficient documentation

## 2011-03-25 DIAGNOSIS — C7949 Secondary malignant neoplasm of other parts of nervous system: Secondary | ICD-10-CM | POA: Insufficient documentation

## 2011-03-25 LAB — GLUCOSE, CAPILLARY: Glucose-Capillary: 113 mg/dL — ABNORMAL HIGH (ref 70–99)

## 2011-03-25 MED ORDER — FLUDEOXYGLUCOSE F - 18 (FDG) INJECTION
12.9000 | Freq: Once | INTRAVENOUS | Status: AC | PRN
  Administered 2011-03-25: 12.9 via INTRAVENOUS

## 2011-03-28 ENCOUNTER — Other Ambulatory Visit (HOSPITAL_COMMUNITY): Payer: Self-pay

## 2011-03-29 ENCOUNTER — Other Ambulatory Visit (HOSPITAL_COMMUNITY): Payer: Self-pay | Admitting: Oncology

## 2011-03-29 ENCOUNTER — Ambulatory Visit (HOSPITAL_COMMUNITY)
Admission: RE | Admit: 2011-03-29 | Discharge: 2011-03-29 | Disposition: A | Discharge status: Home / Self Care | Payer: Medicaid Other | Source: Ambulatory Visit | Attending: Neurosurgery | Admitting: Neurosurgery

## 2011-03-29 ENCOUNTER — Encounter (HOSPITAL_BASED_OUTPATIENT_CLINIC_OR_DEPARTMENT_OTHER): Payer: Medicaid Other | Admitting: Oncology

## 2011-03-29 DIAGNOSIS — C7931 Secondary malignant neoplasm of brain: Secondary | ICD-10-CM | POA: Insufficient documentation

## 2011-03-29 DIAGNOSIS — C7800 Secondary malignant neoplasm of unspecified lung: Secondary | ICD-10-CM

## 2011-03-29 DIAGNOSIS — C189 Malignant neoplasm of colon, unspecified: Secondary | ICD-10-CM | POA: Insufficient documentation

## 2011-03-29 DIAGNOSIS — C186 Malignant neoplasm of descending colon: Secondary | ICD-10-CM

## 2011-03-29 DIAGNOSIS — C801 Malignant (primary) neoplasm, unspecified: Secondary | ICD-10-CM

## 2011-03-29 DIAGNOSIS — C7949 Secondary malignant neoplasm of other parts of nervous system: Secondary | ICD-10-CM | POA: Insufficient documentation

## 2011-03-29 LAB — COMPREHENSIVE METABOLIC PANEL
ALT: 27 U/L (ref 0–35)
AST: 16 U/L (ref 0–37)
Albumin: 3.8 g/dL (ref 3.5–5.2)
Calcium: 8.9 mg/dL (ref 8.4–10.5)
Chloride: 103 mEq/L (ref 96–112)
Potassium: 3.7 mEq/L (ref 3.5–5.3)
Total Protein: 6.6 g/dL (ref 6.0–8.3)

## 2011-03-29 LAB — CBC WITH DIFFERENTIAL/PLATELET
BASO%: 0.1 % (ref 0.0–2.0)
EOS%: 0 % (ref 0.0–7.0)
HGB: 14.6 g/dL (ref 11.6–15.9)
MCH: 28.6 pg (ref 25.1–34.0)
MCHC: 33.6 g/dL (ref 31.5–36.0)
RDW: 15.2 % — ABNORMAL HIGH (ref 11.2–14.5)
WBC: 17 10*3/uL — ABNORMAL HIGH (ref 3.9–10.3)
lymph#: 1 10*3/uL (ref 0.9–3.3)

## 2011-03-29 MED ORDER — GADOBENATE DIMEGLUMINE 529 MG/ML IV SOLN
15.0000 mL | Freq: Once | INTRAVENOUS | Status: AC
  Administered 2011-03-29: 15 mL via INTRAVENOUS

## 2011-03-31 ENCOUNTER — Inpatient Hospital Stay (HOSPITAL_COMMUNITY)
Admission: RE | Admit: 2011-03-31 | Discharge: 2011-04-02 | DRG: 026 | Disposition: A | Discharge status: Home / Self Care | Payer: Medicaid Other | Source: Ambulatory Visit | Attending: Neurosurgery | Admitting: Neurosurgery

## 2011-03-31 ENCOUNTER — Other Ambulatory Visit: Payer: Self-pay | Admitting: Neurosurgery

## 2011-03-31 DIAGNOSIS — I10 Essential (primary) hypertension: Secondary | ICD-10-CM | POA: Diagnosis present

## 2011-03-31 DIAGNOSIS — C7931 Secondary malignant neoplasm of brain: Principal | ICD-10-CM | POA: Diagnosis present

## 2011-03-31 DIAGNOSIS — Z85038 Personal history of other malignant neoplasm of large intestine: Secondary | ICD-10-CM

## 2011-03-31 DIAGNOSIS — C7800 Secondary malignant neoplasm of unspecified lung: Secondary | ICD-10-CM | POA: Diagnosis present

## 2011-03-31 HISTORY — PX: CRANIOTOMY: SHX93

## 2011-03-31 LAB — GLUCOSE, CAPILLARY
Glucose-Capillary: 145 mg/dL — ABNORMAL HIGH (ref 70–99)
Glucose-Capillary: 190 mg/dL — ABNORMAL HIGH (ref 70–99)

## 2011-04-04 LAB — TYPE AND SCREEN: Unit division: 0

## 2011-04-05 NOTE — Discharge Summary (Signed)
NAMEMERCER, STALLWORTH NO.:  [id number]  MEDICAL RECORD NO.:  [id number]  LOCATION:  1311                         FACILITY:  [facility]  PHYSICIAN:  Marcellus Scott, MD     DATE OF BIRTH:  05/06/1951  DATE OF ADMISSION:  03/23/2011 DATE OF DISCHARGE:  03/24/2011                        DISCHARGE SUMMARY - REFERRING   PRIMARY CARE PHYSICIAN:  The patient does not have one.  ONCOLOGIST:  Samul Dada, MD  DISCHARGE DIAGNOSES: 1. Solitary left parietal brain mass, likely metastatic colon cancer. 2. Low back pain. 3. Hypertension. 4. Hyperglycemia/glucose intolerance. 5. 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 6. Metastatic colon cancer.  DISCHARGE MEDICATIONS: 1. Tylenol 650 mg p.o. q.4 h. p.r.n. pain. 2. Dexamethasone 6 mg p.o. q.i.d. 3. Keppra 500 mg p.o. b.i.d. 4. Omeprazole 20 mg p.o. daily. 5. Oxycodone 5 mg p.o. q.4 h. p.r.n. pain. 6. Gabapentin 300 mg p.o. q.p.m. 7. Lisinopril 40 mg p.o. daily.  IMAGING: 1. MRI of the brain without and with contrast on March 23, 2011,     impression - solitary enhancing mass in the left parietal lobe with     surrounding white matter edema.  This is most compatible with a     solitary metastatic deposit.  Primary brain tumor is not considered     likely. 2. X-ray of the lumbar spine, impression - degenerative changes, with     most pronounced changes being facet disease, particularly on the     right at L4-L5.  No acute findings. 3. CT of the head without contrast on March 22, 2011, impression -     large amount of vasogenic edema in the left hemisphere, compatible     with metastatic disease.  LABORATORY DATA:  Hemoglobin A1c 6.  MRSA PCR negative.  LDH 236.  CEA 25.  Basic metabolic panel only significant for glucose of 159, otherwise within normal limits.  CBC significant for white blood cell 11.7, otherwise within normal limits.  Hepatic panel significant for ALT 36, alkaline  phosphatase 131.  CONSULTATIONS: 1. Oncology, Samul Dada, MD 2. Neurosurgery, [name]. [name], MD.  DIET:  Heart-healthy diet.  ACTIVITY:  Increase activity slowly.  TODAY'S COMPLAINTS:  Mild difficulty in using fine movements of her right hand such as, cutting food or writing.  Her strength in both upper and lower limbs are significantly improved.  She indicates that she has minimal intermittent low back pain.  PHYSICAL EXAMINATION:  GENERAL:  The patient is in no obvious distress. VITAL SIGNS:  Temperature 97.8 degrees Fahrenheit, pulse 76 per minute, respiration 18 per minute, blood pressure 123/76 mmHg, and saturating at 95% on room air.  CBG's today range in the 130s.  Yesterday, she had a CBG of 258 at 5:30 p.m. RESPIRATORY SYSTEM:  Clear. CARDIOVASCULAR SYSTEM:  First and second heart sounds heard, regular. ABDOMEN:  Nondistended, soft, and normal bowel sounds heard. CENTRAL NERVOUS SYSTEM:  The patient awake, alert, oriented x4 with no cranial nerve deficit. EXTREMITIES:  With 5/5 right hand grip and 4/5 power in the right lower extremity.  Other limbs are 5/5 power.  HOSPITAL COURSE:  Ms. 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  She now presented with left lower back pain which was acute-on-chronic pain that she has had for sometime and difficulty walking and weakness of the right side with associated numbness for few days.  These symptoms were insidious in onset.  She was initially seen at [facility] in [place]. She was subsequently transferred to the [facility] for further evaluation.  1. Solitary left brain mass, likely metastatic colon cancer.  The     patient had MRI with contrast with findings as above.  She was     started on Decadron for her  peritumoral vasogenic edema and Keppra     for seizure prophylaxis.  The oncologist consulted the     neurosurgeons who kindly saw her and recommended continuing     Decadron 6 mg q.6 h. and craniotomy for resection of the tumor     followed by postoperative stereotactic radiosurgery.  This surgery     is apparently planned 8 days from now which is next Friday. In the     interim, the patient has clinically done well with improvement in     her strength in the right extremity.  This dictator discussed with     Dr. [name] who is agreeable to discharge the patient home and     return for her surgery.  The patient is scheduled to have a PET     scan tomorrow which was scheduled even prior to this admission and     this can be achieved as an outpatient tomorrow at the [facility].  A call has been placed to Dr. [name] to see if he is     agreeable to this plan. 2. Low back pain which is acute on chronic.  X-rays are negative, but     the PET scan should reveal if there is any metastatic disease in     that site.  She has no focal deficits from this or sphincter     problems. 3. Hypertension.  Controlled on current medications.4. Glucose intolerance.  This is complicated by her current steroid     use.  She has required minimal amounts of sliding scale.  With     reduction in her steroid dose, she may not require any insulins,     but is advised to check her CBG's 3-4 times a day and call     physician if her blood sugars are greater than 2000 mg/dL. 5. Metastatic colon cancer.  Management per Oncology.  DISPOSITION:  Once her case is discussed with the neurosurgeon, the patient should be able to go home in stable condition and follow up as outpatient.   FOLLOWUP RECOMMENDATIONS: 1. With Dr. [name] for surgery planning. 2. With Dr. Kimberlee Nearing.  The patient is to call for an appointment.  The case manager will assist the patient in trying to find a primary care  physician and to assist with some of her medication needs.  Discussed with Dr [name] who has cleared patient for discharge home. His office will followup with patient regarding returning for hospital admission, imaging etc.  Time taken in coordinating this discharge is 40 minutes.     Marcellus Scott, MD     AH/MEDQ  D:  03/24/2011  T:  03/24/2011  Job:  [id number]  cc:   Gerarda Fraction  Murinson, M.D. Fax: [phone]  [name]. [name], M.D. Fax: [phone]  Electronically Signed by Marcellus Scott MD on 04/05/2011 05:59:33 PM

## 2011-04-08 ENCOUNTER — Ambulatory Visit
Admit: 2011-04-08 | Discharge: 2011-04-08 | Disposition: A | Discharge status: Home / Self Care | Payer: Self-pay | Attending: Radiation Oncology | Admitting: Radiation Oncology

## 2011-04-08 MED ORDER — GADOBENATE DIMEGLUMINE 529 MG/ML IV SOLN
16.0000 mL | Freq: Once | INTRAVENOUS | Status: AC | PRN
  Administered 2011-04-08: 16 mL via INTRAVENOUS

## 2011-04-11 ENCOUNTER — Ambulatory Visit
Admission: RE | Admit: 2011-04-11 | Discharge: 2011-04-11 | Disposition: A | Discharge status: Home / Self Care | Payer: Medicaid Other | Source: Ambulatory Visit | Attending: Radiation Oncology | Admitting: Radiation Oncology

## 2011-04-11 DIAGNOSIS — Z510 Encounter for antineoplastic radiation therapy: Secondary | ICD-10-CM | POA: Insufficient documentation

## 2011-04-11 DIAGNOSIS — C189 Malignant neoplasm of colon, unspecified: Secondary | ICD-10-CM | POA: Insufficient documentation

## 2011-04-11 DIAGNOSIS — C7931 Secondary malignant neoplasm of brain: Secondary | ICD-10-CM | POA: Insufficient documentation

## 2011-04-12 NOTE — Op Note (Signed)
  [name], TARLETON NO.:  [id number]  MEDICAL RECORD NO.:  [id number]  LOCATION:  3109                         FACILITY:  [facility]  PHYSICIAN:  [name]. [name], M.D.  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  PROCEDURE:  Left parietal stealth-guided craniotomy with resection of the left parietal mass with microdissection.  SURGEON:  [name]. [name], MD  ASSISTANT:  Georgiann Cocker, RN and Cristi Loron, MD  ANESTHESIA:  General endotracheal anesthesia.  ESTIMATED BLOOD LOSS:  Minimal.  COMPLICATIONS:  None.  DISPOSITION:  Recovery.  INDICATIONS:  Kayla [name] is a 60 year old woman with known colon cancer primary with a large solitary brain metastasis.  It was elected to perform craniotomy and resection of this metastasis.  PROCEDURE:  Kayla [name] was brought to the operating room.  Following satisfactory and uncomplicated induction of general endotracheal anesthesia plus intravenous lines, Foley catheter, and arterial line, she was placed in the right lateral decubitus position with a bump under her left shoulder, placed in three-pinhead fixation exposing the left parietal region.  Her left parietal scalp was shaved and using a stealth neuronavigation system with a previously loaded three-dimensional model head and tumor, her scalp was registered and the planned craniotomy flap was then marked.  The sagittal sinus was marked.  The scalp was then prepped and draped in usual sterile fashion.  The area of planned incision was infiltrated with local lidocaine with epinephrine. Incision was made.  A linear incision directly over the planned craniotomy flap was then made and Raney clips were applied.   Cerebellar retractors were placed to facilitate exposure.  A square approximately 2 x 2-cm bone flap was then elevated with four trephines, and hemostasis was assured over the dura.  The dura was then tacked up circumferentially around the bone flap.  The dura was then opened in cruciate fashion.  Using stealth, the most superficial portion of the tumor which still below the brain surface was identified and a core academy was made.  The tumor was then identified and removed in its entirety.  There, the stealth was then used to confirm extent of resection.  Microdissection was utilized to assure completion of resection.  Hemostasis was assured in the resection cavity, which was then lined with Surgicel.  The dura was then closed with 4-0 Nurolon stitches and 2 x 2 piece of DuraGen was placed overlying the dural opening.  The bone flap was replaced with plates and the scalp was closed with a 2-0 Vicryl galeal stitches.  The skin edges were reapproximated with staples.  The sterile occlusive dressing was placed. The patient was taken out of three-pinhead fixation and taken to recovery room and tolerated the procedure well.     [name]. [name], M.D.     JDS/MEDQ  D:  03/31/2011  T:  03/31/2011  Job:  [id number]  Electronically Signed by [name] M.D. on 04/12/2011 10:36:29 AM

## 2011-04-15 DIAGNOSIS — Z923 Personal history of irradiation: Secondary | ICD-10-CM | POA: Insufficient documentation

## 2011-04-15 HISTORY — DX: Personal history of irradiation: Z92.3

## 2011-04-19 ENCOUNTER — Encounter (HOSPITAL_BASED_OUTPATIENT_CLINIC_OR_DEPARTMENT_OTHER): Payer: Medicare Other | Admitting: Oncology

## 2011-04-19 ENCOUNTER — Other Ambulatory Visit (HOSPITAL_COMMUNITY): Payer: Self-pay | Admitting: Oncology

## 2011-04-19 DIAGNOSIS — Z452 Encounter for adjustment and management of vascular access device: Secondary | ICD-10-CM

## 2011-04-19 DIAGNOSIS — C7800 Secondary malignant neoplasm of unspecified lung: Secondary | ICD-10-CM

## 2011-04-19 DIAGNOSIS — C186 Malignant neoplasm of descending colon: Secondary | ICD-10-CM

## 2011-04-19 DIAGNOSIS — C7931 Secondary malignant neoplasm of brain: Secondary | ICD-10-CM

## 2011-04-19 DIAGNOSIS — C801 Malignant (primary) neoplasm, unspecified: Secondary | ICD-10-CM

## 2011-04-19 DIAGNOSIS — C7949 Secondary malignant neoplasm of other parts of nervous system: Secondary | ICD-10-CM

## 2011-04-19 LAB — CBC WITH DIFFERENTIAL/PLATELET
BASO%: 0.1 % (ref 0.0–2.0)
Eosinophils Absolute: 0 10*3/uL (ref 0.0–0.5)
MCV: 88 fL (ref 79.5–101.0)
MONO%: 3.7 % (ref 0.0–14.0)
NEUT#: 9.3 10*3/uL — ABNORMAL HIGH (ref 1.5–6.5)
RBC: 4.67 10*6/uL (ref 3.70–5.45)
RDW: 18.4 % — ABNORMAL HIGH (ref 11.2–14.5)
WBC: 10.3 10*3/uL (ref 3.9–10.3)

## 2011-04-19 LAB — COMPREHENSIVE METABOLIC PANEL
ALT: 28 U/L (ref 0–35)
AST: 13 U/L (ref 0–37)
Albumin: 3.7 g/dL (ref 3.5–5.2)
Alkaline Phosphatase: 64 U/L (ref 39–117)
Glucose, Bld: 158 mg/dL — ABNORMAL HIGH (ref 70–99)
Potassium: 4.2 mEq/L (ref 3.5–5.3)
Sodium: 142 mEq/L (ref 135–145)
Total Protein: 5.9 g/dL — ABNORMAL LOW (ref 6.0–8.3)

## 2011-05-06 ENCOUNTER — Encounter (HOSPITAL_BASED_OUTPATIENT_CLINIC_OR_DEPARTMENT_OTHER): Payer: Medicare Other | Admitting: Oncology

## 2011-05-06 ENCOUNTER — Other Ambulatory Visit (HOSPITAL_COMMUNITY): Payer: Self-pay | Admitting: Oncology

## 2011-05-06 DIAGNOSIS — C7949 Secondary malignant neoplasm of other parts of nervous system: Secondary | ICD-10-CM

## 2011-05-06 DIAGNOSIS — C7931 Secondary malignant neoplasm of brain: Secondary | ICD-10-CM

## 2011-05-06 DIAGNOSIS — C7800 Secondary malignant neoplasm of unspecified lung: Secondary | ICD-10-CM

## 2011-05-06 DIAGNOSIS — C186 Malignant neoplasm of descending colon: Secondary | ICD-10-CM

## 2011-05-06 DIAGNOSIS — C801 Malignant (primary) neoplasm, unspecified: Secondary | ICD-10-CM

## 2011-05-06 LAB — CBC WITH DIFFERENTIAL/PLATELET
BASO%: 0.4 % (ref 0.0–2.0)
EOS%: 0.3 % (ref 0.0–7.0)
HCT: 40.2 % (ref 34.8–46.6)
LYMPH%: 14.3 % (ref 14.0–49.7)
MCH: 29.4 pg (ref 25.1–34.0)
MCHC: 33.3 g/dL (ref 31.5–36.0)
NEUT%: 79 % — ABNORMAL HIGH (ref 38.4–76.8)
Platelets: 73 10*3/uL — ABNORMAL LOW (ref 145–400)

## 2011-05-09 ENCOUNTER — Ambulatory Visit
Admission: RE | Admit: 2011-05-09 | Discharge: 2011-05-09 | Disposition: A | Discharge status: Home / Self Care | Payer: Medicare Other | Source: Ambulatory Visit | Attending: Radiation Oncology | Admitting: Radiation Oncology

## 2011-05-09 DIAGNOSIS — C7931 Secondary malignant neoplasm of brain: Secondary | ICD-10-CM | POA: Insufficient documentation

## 2011-05-09 DIAGNOSIS — C7949 Secondary malignant neoplasm of other parts of nervous system: Secondary | ICD-10-CM | POA: Insufficient documentation

## 2011-05-09 DIAGNOSIS — C801 Malignant (primary) neoplasm, unspecified: Secondary | ICD-10-CM | POA: Insufficient documentation

## 2011-05-11 LAB — COMPREHENSIVE METABOLIC PANEL
AST: 38 U/L — ABNORMAL HIGH (ref 0–37)
Alkaline Phosphatase: 99 U/L (ref 39–117)
BUN: 14 mg/dL (ref 6–23)
Calcium: 10.3 mg/dL (ref 8.4–10.5)
Creatinine, Ser: 0.85 mg/dL (ref 0.50–1.10)

## 2011-05-18 ENCOUNTER — Other Ambulatory Visit: Payer: Self-pay | Admitting: Radiation Oncology

## 2011-05-18 DIAGNOSIS — C7931 Secondary malignant neoplasm of brain: Secondary | ICD-10-CM

## 2011-05-19 ENCOUNTER — Encounter (HOSPITAL_BASED_OUTPATIENT_CLINIC_OR_DEPARTMENT_OTHER): Payer: Medicare Other | Admitting: Oncology

## 2011-05-19 ENCOUNTER — Other Ambulatory Visit (HOSPITAL_COMMUNITY): Payer: Self-pay | Admitting: Oncology

## 2011-05-19 DIAGNOSIS — Z452 Encounter for adjustment and management of vascular access device: Secondary | ICD-10-CM

## 2011-05-19 DIAGNOSIS — C7931 Secondary malignant neoplasm of brain: Secondary | ICD-10-CM

## 2011-05-19 DIAGNOSIS — C801 Malignant (primary) neoplasm, unspecified: Secondary | ICD-10-CM

## 2011-05-19 DIAGNOSIS — C186 Malignant neoplasm of descending colon: Secondary | ICD-10-CM

## 2011-05-19 DIAGNOSIS — C7949 Secondary malignant neoplasm of other parts of nervous system: Secondary | ICD-10-CM

## 2011-05-19 DIAGNOSIS — C7800 Secondary malignant neoplasm of unspecified lung: Secondary | ICD-10-CM

## 2011-05-19 LAB — CBC WITH DIFFERENTIAL/PLATELET
Eosinophils Absolute: 0.1 10*3/uL (ref 0.0–0.5)
MCV: 86.4 fL (ref 79.5–101.0)
MONO#: 0.6 10*3/uL (ref 0.1–0.9)
MONO%: 6.2 % (ref 0.0–14.0)
NEUT#: 7 10*3/uL — ABNORMAL HIGH (ref 1.5–6.5)
RBC: 4.48 10*6/uL (ref 3.70–5.45)
RDW: 17.9 % — ABNORMAL HIGH (ref 11.2–14.5)
WBC: 8.9 10*3/uL (ref 3.9–10.3)

## 2011-05-19 LAB — COMPREHENSIVE METABOLIC PANEL
ALT: 29 U/L (ref 0–35)
CO2: 22 mEq/L (ref 19–32)
Sodium: 143 mEq/L (ref 135–145)
Total Bilirubin: 0.8 mg/dL (ref 0.3–1.2)
Total Protein: 6.6 g/dL (ref 6.0–8.3)

## 2011-05-19 LAB — CEA: CEA: 1.3 ng/mL (ref 0.0–5.0)

## 2011-05-19 LAB — LACTATE DEHYDROGENASE: LDH: 365 U/L — ABNORMAL HIGH (ref 94–250)

## 2011-05-20 NOTE — Op Note (Signed)
  NAMEDELENA, Kayla [name] NO.:  [id number]  MEDICAL RECORD NO.:  [id number]  LOCATION:                                 FACILITY:  PHYSICIAN:  [name]. [name], M.D.  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  PROCEDURE:  Stereotactic radiosurgery to the brain metastasis.  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  INDICATION:  Kayla [name] is a 60 year old woman with 2.7 cm left parietal brain metastasis consistent with metastatic adenocarcinoma with a colon cancer primary.  The patient had craniotomy resection of this tumor and then it was elected to perform stereotactic radiosurgery to the tumor cavity.  This was done using an Aquaplast face mask and dynamic conformal arcs.  This involved the merger of the cranial CT scan as well as MRI.  I performed __________ treatment planning and also treatment delivery with the patient in conjunction with Dr. Margaretmary Dys and the physics staff.  This was done using five dynamic conformal arcs to the left parietal tumor using flatting-free treatment with a framework plan to a total dose of 17 Gy.  The patient tolerated the procedure well without apparent complication or ill effect and was sent to recovery where she was observed for an hour and then discharged home.     [name]. [name], M.D.     JDS/MEDQ  D:  05/03/2011  T:  05/04/2011  Job:  [id number]  Electronically Signed by [name] M.D. on 05/20/2011 10:52:28 AM

## 2011-06-10 ENCOUNTER — Other Ambulatory Visit: Payer: Self-pay | Admitting: Oncology

## 2011-06-10 DIAGNOSIS — C186 Malignant neoplasm of descending colon: Secondary | ICD-10-CM | POA: Insufficient documentation

## 2011-06-10 DIAGNOSIS — C189 Malignant neoplasm of colon, unspecified: Secondary | ICD-10-CM

## 2011-06-23 ENCOUNTER — Other Ambulatory Visit (HOSPITAL_COMMUNITY): Payer: Self-pay | Admitting: Oncology

## 2011-06-23 ENCOUNTER — Other Ambulatory Visit (HOSPITAL_BASED_OUTPATIENT_CLINIC_OR_DEPARTMENT_OTHER): Payer: Medicare Other | Admitting: Lab

## 2011-06-23 ENCOUNTER — Ambulatory Visit (HOSPITAL_BASED_OUTPATIENT_CLINIC_OR_DEPARTMENT_OTHER): Payer: Medicare Other | Admitting: Oncology

## 2011-06-23 ENCOUNTER — Telehealth: Payer: Self-pay | Admitting: Oncology

## 2011-06-23 VITALS — BP 128/85 | HR 113 | Temp 97.5°F | Ht 64.5 in | Wt 176.7 lb

## 2011-06-23 DIAGNOSIS — C189 Malignant neoplasm of colon, unspecified: Secondary | ICD-10-CM

## 2011-06-23 DIAGNOSIS — C7949 Secondary malignant neoplasm of other parts of nervous system: Secondary | ICD-10-CM

## 2011-06-23 DIAGNOSIS — C7931 Secondary malignant neoplasm of brain: Secondary | ICD-10-CM

## 2011-06-23 DIAGNOSIS — C801 Malignant (primary) neoplasm, unspecified: Secondary | ICD-10-CM

## 2011-06-23 DIAGNOSIS — C186 Malignant neoplasm of descending colon: Secondary | ICD-10-CM

## 2011-06-23 DIAGNOSIS — C7800 Secondary malignant neoplasm of unspecified lung: Secondary | ICD-10-CM

## 2011-06-23 DIAGNOSIS — G620 Drug-induced polyneuropathy: Secondary | ICD-10-CM

## 2011-06-23 DIAGNOSIS — R29898 Other symptoms and signs involving the musculoskeletal system: Secondary | ICD-10-CM

## 2011-06-23 LAB — CEA: CEA: <0.5 ng/mL (ref 0.0–5.0)

## 2011-06-23 LAB — COMPREHENSIVE METABOLIC PANEL
BUN: 11 mg/dL (ref 6–23)
CO2: 25 mEq/L (ref 19–32)
Calcium: 10 mg/dL (ref 8.4–10.5)
Chloride: 103 mEq/L (ref 96–112)
Creatinine, Ser: 0.93 mg/dL (ref 0.50–1.10)
Total Bilirubin: 0.8 mg/dL (ref 0.3–1.2)

## 2011-06-23 LAB — CBC WITH DIFFERENTIAL/PLATELET
Basophils Absolute: 0 10*3/uL (ref 0.0–0.1)
EOS%: 1.1 % (ref 0.0–7.0)
HCT: 44.1 % (ref 34.8–46.6)
HGB: 14.5 g/dL (ref 11.6–15.9)
MCH: 28.5 pg (ref 25.1–34.0)
MONO#: 0.4 10*3/uL (ref 0.1–0.9)
NEUT%: 76 % (ref 38.4–76.8)
lymph#: 1.9 10*3/uL (ref 0.9–3.3)

## 2011-06-23 LAB — LACTATE DEHYDROGENASE: LDH: 202 U/L (ref 94–250)

## 2011-06-23 NOTE — Progress Notes (Signed)
This office note has been dictated.  #[id number]

## 2011-06-23 NOTE — Progress Notes (Signed)
CC:   Kayla [name], M.D. Kayla [name], M.D. Kayla Abed, MD, [facility] Kayla [name]. Kayla [name], M.D.  HISTORY:  Kayla [name] is seen today for followup of her metastatic colon cancer most recently with a recurrence in the left parietal region of her brain picked up on 03/23/2011 when Kayla [name] presented with some difficulties with her right upper extremity.  Her CEA was elevated at that time up to 16.2; however, a PET scan carried out on 03/25/2011 was negative.  Kayla [name] underwent craniotomy and resection of this tumor on 03/31/2011 by Dr. Danae [name]. Kayla [name].  On 04/15/2011 the patient receive stereotactic radiosurgery by Dr. Kathrynn [name].  Kayla [name] was doing fairly well over the past several weeks.  She was able to taper off her Decadron and her Keppra approximately 4-6 weeks ago.  Over the past couple of weeks, Kayla [name] has once again noted some difficulties with altered sensation in her right upper extremity, some weakness and particularly difficulties with gait and weakness involving her right leg.  She is also having some discomfort in both eyes, left greater than right.  Kayla [name] tells me that she is scheduled to have an MRI of the brain some time in January ordered by Dr. Kathrynn [name].  She is here today with Kayla [name] who is like a daughter to Kayla [name].  PROBLEM LIST: 1. Diagnosis of colon cancer dates back to 10/27/2008 when the patient     presented with what turned out to be bowel perforation through     tumor.  Her stage at that time was T4 N1 M1 with pulmonary and     metastatic disease.  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  Kayla [name]     underwent surgical resection of her tumor with a colostomy that was     developed at the time of her surgery on 10/27/2008, subsequently     reversed on 09/20/2010.  Kayla [name] received chemotherapy consisting     of FOLFOX for 10 treatments from 12/10/2008 through 06/02/2009     resulting in a partial remission.  She then received additional     chemotherapy with  5-FU, leucovorin, 5-FU by continuous infusion and     Avastin from 06/23/2009 through 11/17/2009.  Kayla [name] did develop     peripheral neuropathy in her feet from the oxaliplatin.  As stated     above, she was doing well until she developed right upper extremity     weakness, found to have the left parietal metastatic tumor that was     resected on 03/31/2011 and underwent stereotactic radiation on     04/15/2011.  Kayla [name]'s last MRI of the brain was carried out on     04/08/2011. 2. Hypertension. 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 5. Right-sided Port-A-Cath last flushed with heparin on 05/19/2011.  MEDICINES:  Neurontin 300 mg at bedtime, Advil, lisinopril 40 mg every morning, OxyIR as needed.  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  She has moon faces.  Vital signs:  Weight is 176 pounds 11.2 ounces, height 5 feet 4-1/2 inches, body surface area 1.91 meters squared.  Blood pressure 128/85 in the left arm sitting, pulse 113 and regular, respirations unlabored, she is afebrile.  HEENT:  There is no scleral icterus.  No facial asymmetry.  Mouth and pharynx benign.  Tongue is midline.  Neck:  Without adenopathy.  Heart/lungs:  Normal.  There is a right-sided Port-A-Cath that was last flushed with heparin on 05/19/2011.  No  axillary adenopathy.  Abdomen:  Benign.  Extremities: No peripheral edema.  Station and gait are abnormal.  Finger-to-nose is fairly good but impaired by some right upper extremity weakness which seems to be less apparent than the weakness in the right leg.  LABORATORY DATA:  Today white count 9.8, ANC 7.4, hemoglobin 14.5, hematocrit 44.1, and platelets 317,000.  Chemistries from 05/19/2011 were normal except for glucose of 105 and an LDH of 365.  LDH was 245 on 04/19/2011.  CEA on 11/01 was 1.3 as compared with 4.8 on 09/28, 16.2 on 09/11, 24.7 on 09/05 and 3.7 on 02/01/2011.   MRI of the head with and without IV contrast on 04/08/2011 showed postsurgical changes in the left parietal lobe with a postsurgical hematoma present in the area with enhancement felt to be due to postoperative changes but however residual tumor could not be excluded.  There was improvement in the white matter edema.  There was a small left-sided subdural hematoma present with a 3 mm midline shift with no other metastatic disease noted.  IMPRESSION AND PLAN:  I am concerned about Kayla [name]'s right-sided weakness of 2 weeks' duration.  We will go ahead and get an MRI of the head with and without IV contrast hopefully within the next few days. Kayla [name] may need an earlier appointment to see Kayla [name] and Kayla [name]. For the moment, Kayla [name] is not on any systemic treatment for her colon cancer.  As stated, there was no evidence of any disease except for the brain back when we did the PET scan on 03/25/2011.  CEA should be a sensitive marker of her disease since it was quite elevated at 24.7 on 09/05.  We will plan to see Kayla [name] again on or about January 7th at which time we will check CBC, chemistries and CEA.  We will need to continue to flush Kayla [name]'s Port-A-Cath with heparin every 2 months and therefore it will be due for heparin flush on 12/07.    ______________________________ Samul Dada, M.D. DSM/MEDQ  D:  06/23/2011  T:  06/23/2011  Job:  [id number]

## 2011-06-23 NOTE — Progress Notes (Signed)
Pt states that last week her right leg from her knee to her foot feels really heavy. She feels like she is having to drag her leg. She states that her right arm feels very heavy and she feels her balance is off. She states she had these same symptoms when she was diagnosed with there brain tumor but the symptoms were a lot worse. Dr. [name] to exam and evaluate pt today.

## 2011-06-23 NOTE — Telephone Encounter (Signed)
gve the pt her jan 2013 appt calendar along with the mri appt.

## 2011-06-28 ENCOUNTER — Ambulatory Visit (HOSPITAL_COMMUNITY)
Admission: RE | Admit: 2011-06-28 | Discharge: 2011-06-28 | Disposition: A | Discharge status: Home / Self Care | Payer: Medicare Other | Source: Ambulatory Visit | Attending: Oncology | Admitting: Oncology

## 2011-06-28 DIAGNOSIS — R262 Difficulty in walking, not elsewhere classified: Secondary | ICD-10-CM | POA: Insufficient documentation

## 2011-06-28 DIAGNOSIS — Z9889 Other specified postprocedural states: Secondary | ICD-10-CM | POA: Insufficient documentation

## 2011-06-28 DIAGNOSIS — C7931 Secondary malignant neoplasm of brain: Secondary | ICD-10-CM | POA: Insufficient documentation

## 2011-06-28 DIAGNOSIS — R29898 Other symptoms and signs involving the musculoskeletal system: Secondary | ICD-10-CM | POA: Insufficient documentation

## 2011-06-28 DIAGNOSIS — C189 Malignant neoplasm of colon, unspecified: Secondary | ICD-10-CM

## 2011-06-28 MED ORDER — GADOBENATE DIMEGLUMINE 529 MG/ML IV SOLN
20.0000 mL | Freq: Once | INTRAVENOUS | Status: AC | PRN
  Administered 2011-06-28: 16 mL via INTRAVENOUS

## 2011-06-30 ENCOUNTER — Other Ambulatory Visit (HOSPITAL_COMMUNITY): Payer: Self-pay | Admitting: Oncology

## 2011-06-30 DIAGNOSIS — Z139 Encounter for screening, unspecified: Secondary | ICD-10-CM

## 2011-07-15 ENCOUNTER — Other Ambulatory Visit: Payer: Medicare Other

## 2011-07-21 ENCOUNTER — Other Ambulatory Visit (HOSPITAL_COMMUNITY): Payer: Self-pay | Admitting: Oncology

## 2011-07-21 DIAGNOSIS — I10 Essential (primary) hypertension: Secondary | ICD-10-CM

## 2011-07-22 ENCOUNTER — Encounter (HOSPITAL_COMMUNITY): Payer: Self-pay | Admitting: Oncology

## 2011-07-22 DIAGNOSIS — I10 Essential (primary) hypertension: Secondary | ICD-10-CM | POA: Insufficient documentation

## 2011-07-25 ENCOUNTER — Other Ambulatory Visit (HOSPITAL_BASED_OUTPATIENT_CLINIC_OR_DEPARTMENT_OTHER): Payer: Medicare Other

## 2011-07-25 ENCOUNTER — Encounter: Payer: Self-pay | Admitting: Oncology

## 2011-07-25 ENCOUNTER — Ambulatory Visit (HOSPITAL_BASED_OUTPATIENT_CLINIC_OR_DEPARTMENT_OTHER): Payer: Medicare Other | Admitting: Oncology

## 2011-07-25 ENCOUNTER — Telehealth: Payer: Self-pay | Admitting: Oncology

## 2011-07-25 DIAGNOSIS — C189 Malignant neoplasm of colon, unspecified: Secondary | ICD-10-CM

## 2011-07-25 DIAGNOSIS — Z4682 Encounter for fitting and adjustment of non-vascular catheter: Secondary | ICD-10-CM

## 2011-07-25 DIAGNOSIS — C7949 Secondary malignant neoplasm of other parts of nervous system: Secondary | ICD-10-CM

## 2011-07-25 DIAGNOSIS — C7931 Secondary malignant neoplasm of brain: Secondary | ICD-10-CM

## 2011-07-25 DIAGNOSIS — D696 Thrombocytopenia, unspecified: Secondary | ICD-10-CM | POA: Insufficient documentation

## 2011-07-25 DIAGNOSIS — Z85038 Personal history of other malignant neoplasm of large intestine: Secondary | ICD-10-CM

## 2011-07-25 LAB — CBC WITH DIFFERENTIAL/PLATELET
Basophils Absolute: 0.1 10*3/uL (ref 0.0–0.1)
EOS%: 0 % (ref 0.0–7.0)
Eosinophils Absolute: 0 10*3/uL (ref 0.0–0.5)
HCT: 41.6 % (ref 34.8–46.6)
HGB: 14 g/dL (ref 11.6–15.9)
MCH: 29 pg (ref 25.1–34.0)
MCV: 86.2 fL (ref 79.5–101.0)
MONO%: 2.9 % (ref 0.0–14.0)
NEUT#: 13.1 10*3/uL — ABNORMAL HIGH (ref 1.5–6.5)
NEUT%: 92.1 % — ABNORMAL HIGH (ref 38.4–76.8)

## 2011-07-25 LAB — COMPREHENSIVE METABOLIC PANEL
AST: 15 U/L (ref 0–37)
Alkaline Phosphatase: 51 U/L (ref 39–117)
BUN: 32 mg/dL — ABNORMAL HIGH (ref 6–23)
Creatinine, Ser: 0.97 mg/dL (ref 0.50–1.10)
Glucose, Bld: 165 mg/dL — ABNORMAL HIGH (ref 70–99)
Total Bilirubin: 0.7 mg/dL (ref 0.3–1.2)

## 2011-07-25 LAB — CEA: CEA: 2.2 ng/mL (ref 0.0–5.0)

## 2011-07-25 MED ORDER — HEPARIN SOD (PORK) LOCK FLUSH 100 UNIT/ML IV SOLN
500.0000 [IU] | Freq: Once | INTRAVENOUS | Status: AC
  Administered 2011-07-25: 500 [IU] via INTRAVENOUS
  Filled 2011-07-25: qty 5

## 2011-07-25 MED ORDER — SODIUM CHLORIDE 0.9 % IJ SOLN
10.0000 mL | Freq: Once | INTRAMUSCULAR | Status: AC
  Administered 2011-07-25: 10 mL via INTRAVENOUS
  Filled 2011-07-25: qty 10

## 2011-07-25 NOTE — Progress Notes (Signed)
CC:   Kayla [name], M.D. Kayla [name], M.D. Kayla Abed, MD, [facility] Kayla [name]. Kayla [name], M.D.  HISTORY:  I saw Kayla [name] today for followup of her metastatic colon cancer with recurrence in the left parietal region of her brain picked up on 03/23/2011 when Kayla [name] presented with some weakness in her right upper extremity.  Kayla [name] was found to have a left parietal metastatic tumor that was resected by Kayla [name] on 03/31/2011 followed by stereotactic radiation on 04/15/2011.  Kayla [name] is accompanied today by her friend, Kayla [name].  Kayla [name] was last seen by [place] on 06/23/2011.  At that point, she was having weakness in both arm and leg and difficulty walking.  An MRI of the brain with and without IV contrast carried out on 06/28/2011 showed a 3 cm area of restricted diffusion lying within the previous area of left parietal surgical resection.  There was enhancement in the peripheral aspect of the cavity with marked restricted diffusion, increased vasogenic edema, and a hypointense peripheral margin,  suggesting mature wall formation. 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  The patient was started on high-dose Decadron 4 mg three times a day.  She saw Kayla [name] on 07/06/2011.  At that time, she was markedly improved. Additional medicines that were started at that time included Keppra 500 mg twice a day, Protonix, Trental, and vitamin E.  Although there was some concern about a brain abscess, there was some atypical clinical thick features.  Another possibility, I believe, was radiation  change. According to Kayla [name] note from 07/06/2011, the patient's case was discussed at the coordinated brain tumor conference.  A consultation with the radiosurgical team at the [facility] was also going to be obtained.  Kayla [name] is scheduled to have a repeat MRI of the head with and without IV contrast this Friday, 07/29/2011, and I  believe she is scheduled to see Kayla [name] on 08/03/2011.  As stated, Kayla [name]'s condition has improved since she was last seen by me on 06/23/2011.  She denies any falls or seizures.  She thinks she is about 90% improved, although she still has a little problem with her balance.  She does not seem to be having much in the way of proximal muscle weakness.  Strength on the right side is improved.  She is markedly cushingoid but without other apparent affects from the high dose of Decadron.  She denies any bleeding or bruising problems.  Of note today is a platelet count of 63,000.  In looking through Kayla [name]'s chart, she had a platelet count of 73,000 back on 05/06/2011 when she was also on Keppra.  Kayla [name] is without any complaints today.  She generally feels well.  She denies any GI problems.  No bleeding or bruising.  She does feel a little short of breath and has a slight cough, but no chest pain or lightheadedness.  No swelling of the legs.  PROBLEM LIST: 1. Diagnosis of colon cancer dates back to 10/27/2008 when Kayla [name]     presented with what turned out to be a bowel perforation through     tumor.  Stage at that time was T4 N1 M1 with pulmonary metastatic     disease.  The K-ras mutation was detected.  Kayla [name] underwent     surgical resection of her tumor with a colostomy at the time of her     surgery on 10/27/2008.  The colostomy has been subsequently  reversed on 09/20/2010.  Kayla [name] received chemotherapy consisting     of FOLFOX for 10 treatments from 12/10/2008 through 06/02/2009.     She achieved a partial remission from these treatments.  She then     received additional chemotherapy with 5-FU, leucovorin, and 5-FU by     continuous infusion along with Avastin from 06/23/2009 through     11/17/2009.  Kayla [name] had some peripheral neuropathy in her feet     from the oxaliplatin.  She was doing well without evidence of     disease until she developed a right hemiparesthesia  in September     and was found to have a 3 x 3 x 2.4 cm, lobulated, enhancing mass     in the left parietal lobe with marked surrounding edema.  A PET     scan on 03/25/2011 showed resolution of the previously identified     pulmonary nodules with no residual hypermetabolic activity.  The     pericardial effusion, which has been present since diagnosis, was     unchanged.  There were also uterine fibroids and a low-density     lesion in the anterior spleen that was stable and not associated     with any hypermetabolic activity.  Of note, Kayla [name] had a markedly     elevated CEA up to 24.7 on 03/23/2011.  On 06/23/2011, the CEA was     less than 0.5. 2. Abnormal MRI of the head on 06/28/2011 associated with right     hemiparesis. 3. Thrombocytopenia with platelet count today of 63,000, possibly due     to Keppra. 4. Hypertension. 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 7. Right-sided Port-A-Cath.  This is to be flushed with heparin today.     It was placed on 12/10/2008.  MEDICATIONS: 1. Decadron 4 mg t.i.d. 2. Keppra 500 mg twice a day. 3. Protonix 40 mg daily. 4. Trental 400 mg twice a day with meals. 5. Vitamin E 400 international units by mouth daily. 6. Neurontin 300 mg at bedtime. 7. Lisinopril 40 mg daily. 8. OxyIR as needed. The medicines are reviewed and recorded.  The new medicines are: 1. Decadron. 2. Keppra. 3. Protonix. 4. Trental. 5. Vitamin E.  PHYSICAL EXAMINATION:  General Appearance:  Kayla [name] is floridly cushingoid in no acute distress.  Vital Signs:  Weight is 176 pounds and 14.4 ounces.  Weight is stable.  Height is 5 feet and 4-1/2 inches. Body surface area 1.91 m2.  Blood pressure today 135/94.  O2 saturation is 98%.  Other vitalsigns are normal.  HEENT:  There is no scleral icterus.  Mouth and pharynx are benign.  No thrush or ulcers.  No obvious facial asymmetry. Neck:  Without  adenopathy.  Heart:  Normal.  Lungs:  Normal.  There is a right-sided Port-A-Cath which will be flushed with heparin today.  This was last flushed with heparin on 05/19/2011.  No axillary adenopathy. Abdomen:  Benign with no organomegaly or masses palpable.  Extremities: No peripheral edema.  Strength in the upper extremities is fairly good. Kayla [name] was able to rise from a sitting position without using her upper extremity.  She was not able to tandem walk.  She was able to stand on her left leg somewhat better and then she could stand on her right leg and needed to hold on.  LABORATORY DATA:  Today, white count 14.3, ANC 13.1, hemoglobin 14, hematocrit 41.6, platelets 63,000 as compared  with 317,000 on 06/23/2011.  Platelet count on 05/19/2011 was 305,000 and on 05/06/2011 was 73,000.  Chemistries from 06/23/2011 were notable for a glucose of 116, AST of 39, otherwise normal.  LDH was 202 and albumin 4.1. Platelets today are described as large, suggesting peripheral destruction seen with either ITP or drug-induced thrombocytopenia.  CEA from 06/23/2011 was less than 0.5.  On 03/23/2011,  CEA was 24.7 whereas on 03/29/2011 it was 16.2 and then 4.8 on 04/15/2011.  IMAGING STUDIES:  MRI of the brain with and without IV contrast was done on 06/28/2011 and also on 04/08/2011, 03/29/2011, and 03/23/2011.  PET scan was on 03/25/2011.  IMPRESSION AND PLAN:  Clinically, Kayla [name] seems to be doing well on the current program.  The etiology for her abnormal MRI of the brain from 06/28/2011 remains somewhat unclear with possibilities being radiation damage, brain abscess, or possibly recurrent tumor.  A repeat MRI of the brain will be done on 07/29/2011 and Kayla [name] will see Kayla [name] on Wednesday, 08/03/2011.  Of note is thrombocytopenia today, which may be drug induced from possibly the Keppra.  We plan to follow CBCs on a weekly basis and see Kayla [name] again in approximately 4 weeks at which  time we will check CBC, chemistries, and CEA.  Kayla [name] was cautioned about her platelet count and the possibly of bruising and bleeding.  She knows to call us if she notices any bleeding or bruising.  Kayla [name] is on no systemic therapy at this time.  As far as we know, she has no active disease with the possible exception of her left parietal lesion.  It will be recalled that the CEA was elevated prior to the resection of the brain metastasis.  The lung lesions are inapparent on her most recent PET scan from 03/25/2011.  As stated, Kayla [name]'s Port-A-Cath will be flushed with heparin today.    ______________________________ Samul Dada, M.D. DSM/MEDQ  D:  07/25/2011  T:  07/25/2011  Job:  [id number]

## 2011-07-25 NOTE — Telephone Encounter (Signed)
gve the pt her jan,feb 2013 appt calendar °

## 2011-07-25 NOTE — Progress Notes (Signed)
This office note has been dictated.   #[id number]

## 2011-07-29 ENCOUNTER — Ambulatory Visit
Admission: RE | Admit: 2011-07-29 | Discharge: 2011-07-29 | Disposition: A | Discharge status: Home / Self Care | Payer: Medicare Other | Source: Ambulatory Visit | Attending: Radiation Oncology | Admitting: Radiation Oncology

## 2011-07-29 DIAGNOSIS — C7931 Secondary malignant neoplasm of brain: Secondary | ICD-10-CM

## 2011-07-29 MED ORDER — GADOBENATE DIMEGLUMINE 529 MG/ML IV SOLN
16.0000 mL | Freq: Once | INTRAVENOUS | Status: AC | PRN
  Administered 2011-07-29: 16 mL via INTRAVENOUS

## 2011-08-01 ENCOUNTER — Ambulatory Visit: Payer: Medicare Other

## 2011-08-01 ENCOUNTER — Other Ambulatory Visit (HOSPITAL_BASED_OUTPATIENT_CLINIC_OR_DEPARTMENT_OTHER): Payer: Medicare Other | Admitting: Lab

## 2011-08-01 DIAGNOSIS — C189 Malignant neoplasm of colon, unspecified: Secondary | ICD-10-CM

## 2011-08-01 DIAGNOSIS — C7949 Secondary malignant neoplasm of other parts of nervous system: Secondary | ICD-10-CM

## 2011-08-01 DIAGNOSIS — C7931 Secondary malignant neoplasm of brain: Secondary | ICD-10-CM

## 2011-08-01 DIAGNOSIS — D696 Thrombocytopenia, unspecified: Secondary | ICD-10-CM

## 2011-08-01 LAB — CBC WITH DIFFERENTIAL/PLATELET
BASO%: 0 % (ref 0.0–2.0)
MCHC: 33.2 g/dL (ref 31.5–36.0)
MONO#: 0.4 10*3/uL (ref 0.1–0.9)
RBC: 4.92 10*6/uL (ref 3.70–5.45)
WBC: 10.6 10*3/uL — ABNORMAL HIGH (ref 3.9–10.3)
lymph#: 0.6 10*3/uL — ABNORMAL LOW (ref 0.9–3.3)

## 2011-08-02 ENCOUNTER — Ambulatory Visit (HOSPITAL_COMMUNITY)
Admission: RE | Admit: 2011-08-02 | Discharge: 2011-08-02 | Disposition: A | Discharge status: Home / Self Care | Payer: Medicare Other | Source: Ambulatory Visit | Attending: Oncology | Admitting: Oncology

## 2011-08-02 DIAGNOSIS — Z139 Encounter for screening, unspecified: Secondary | ICD-10-CM

## 2011-08-02 DIAGNOSIS — Z1231 Encounter for screening mammogram for malignant neoplasm of breast: Secondary | ICD-10-CM | POA: Insufficient documentation

## 2011-08-08 ENCOUNTER — Other Ambulatory Visit: Payer: Medicare Other | Admitting: Lab

## 2011-08-08 DIAGNOSIS — C189 Malignant neoplasm of colon, unspecified: Secondary | ICD-10-CM

## 2011-08-08 DIAGNOSIS — D696 Thrombocytopenia, unspecified: Secondary | ICD-10-CM

## 2011-08-08 DIAGNOSIS — C7931 Secondary malignant neoplasm of brain: Secondary | ICD-10-CM

## 2011-08-08 LAB — CBC WITH DIFFERENTIAL/PLATELET
BASO%: 0 % (ref 0.0–2.0)
Basophils Absolute: 0 10*3/uL (ref 0.0–0.1)
HCT: 40.4 % (ref 34.8–46.6)
HGB: 13.6 g/dL (ref 11.6–15.9)
MONO#: 0.5 10*3/uL (ref 0.1–0.9)
NEUT#: 8.3 10*3/uL — ABNORMAL HIGH (ref 1.5–6.5)
NEUT%: 85.8 % — ABNORMAL HIGH (ref 38.4–76.8)
WBC: 9.7 10*3/uL (ref 3.9–10.3)
lymph#: 0.9 10*3/uL (ref 0.9–3.3)

## 2011-08-15 ENCOUNTER — Other Ambulatory Visit (HOSPITAL_BASED_OUTPATIENT_CLINIC_OR_DEPARTMENT_OTHER): Payer: Medicare Other | Admitting: Lab

## 2011-08-15 DIAGNOSIS — C7931 Secondary malignant neoplasm of brain: Secondary | ICD-10-CM

## 2011-08-15 DIAGNOSIS — C7949 Secondary malignant neoplasm of other parts of nervous system: Secondary | ICD-10-CM

## 2011-08-15 DIAGNOSIS — C189 Malignant neoplasm of colon, unspecified: Secondary | ICD-10-CM

## 2011-08-15 DIAGNOSIS — C801 Malignant (primary) neoplasm, unspecified: Secondary | ICD-10-CM

## 2011-08-15 DIAGNOSIS — D696 Thrombocytopenia, unspecified: Secondary | ICD-10-CM

## 2011-08-15 LAB — CBC WITH DIFFERENTIAL/PLATELET
Basophils Absolute: 0 10*3/uL (ref 0.0–0.1)
EOS%: 0.6 % (ref 0.0–7.0)
HCT: 39.7 % (ref 34.8–46.6)
HGB: 12.8 g/dL (ref 11.6–15.9)
MCH: 28 pg (ref 25.1–34.0)
MCV: 86.9 fL (ref 79.5–101.0)
NEUT%: 71.7 % (ref 38.4–76.8)
lymph#: 1.4 10*3/uL (ref 0.9–3.3)

## 2011-08-25 ENCOUNTER — Telehealth: Payer: Self-pay | Admitting: Medical Oncology

## 2011-08-25 ENCOUNTER — Telehealth: Payer: Self-pay | Admitting: Oncology

## 2011-08-25 ENCOUNTER — Ambulatory Visit (HOSPITAL_BASED_OUTPATIENT_CLINIC_OR_DEPARTMENT_OTHER): Payer: Medicare Other | Admitting: Physician Assistant

## 2011-08-25 ENCOUNTER — Other Ambulatory Visit (HOSPITAL_BASED_OUTPATIENT_CLINIC_OR_DEPARTMENT_OTHER): Payer: Medicare Other | Admitting: Lab

## 2011-08-25 ENCOUNTER — Other Ambulatory Visit: Payer: Self-pay | Admitting: Medical Oncology

## 2011-08-25 ENCOUNTER — Encounter: Payer: Self-pay | Admitting: Physician Assistant

## 2011-08-25 DIAGNOSIS — C7931 Secondary malignant neoplasm of brain: Secondary | ICD-10-CM

## 2011-08-25 DIAGNOSIS — Z452 Encounter for adjustment and management of vascular access device: Secondary | ICD-10-CM

## 2011-08-25 DIAGNOSIS — D696 Thrombocytopenia, unspecified: Secondary | ICD-10-CM

## 2011-08-25 DIAGNOSIS — C189 Malignant neoplasm of colon, unspecified: Secondary | ICD-10-CM

## 2011-08-25 DIAGNOSIS — C7949 Secondary malignant neoplasm of other parts of nervous system: Secondary | ICD-10-CM

## 2011-08-25 LAB — CBC WITH DIFFERENTIAL/PLATELET
Basophils Absolute: 0 10*3/uL (ref 0.0–0.1)
Eosinophils Absolute: 0.1 10*3/uL (ref 0.0–0.5)
HCT: 37.8 % (ref 34.8–46.6)
LYMPH%: 15.1 % (ref 14.0–49.7)
MCV: 84.6 fL (ref 79.5–101.0)
MONO%: 12.5 % (ref 0.0–14.0)
NEUT#: 6 10*3/uL (ref 1.5–6.5)
NEUT%: 71.4 % (ref 38.4–76.8)
Platelets: 316 10*3/uL (ref 145–400)
RBC: 4.46 10*6/uL (ref 3.70–5.45)

## 2011-08-25 LAB — COMPREHENSIVE METABOLIC PANEL
Alkaline Phosphatase: 87 U/L (ref 39–117)
BUN: 14 mg/dL (ref 6–23)
Creatinine, Ser: 1.26 mg/dL — ABNORMAL HIGH (ref 0.50–1.10)
Glucose, Bld: 115 mg/dL — ABNORMAL HIGH (ref 70–99)
Total Bilirubin: 0.6 mg/dL (ref 0.3–1.2)

## 2011-08-25 LAB — LACTATE DEHYDROGENASE: LDH: 411 U/L — ABNORMAL HIGH (ref 94–250)

## 2011-08-25 LAB — CEA: CEA: 1.1 ng/mL (ref 0.0–5.0)

## 2011-08-25 NOTE — Telephone Encounter (Signed)
I called pt to let her know that her LDH was elevated today and he would like to recheck in 3 weeks. She is aware that the schedulers will call her with an appointment.

## 2011-08-25 NOTE — Progress Notes (Signed)
[facility] OFFICE PROGRESS NOTE  [name], DO, DO  CC: [name], M.D.  [name], M.D.  [name], MD, [facility]  [name]. [name], M.D.   INTERIM HISTORY: I saw Kayla [name] today for followup of her metastatic colon cancer with recurrence in the left parietal region of her brain picked up on 03/23/2011 when Taline presented with some weakness in her right upper extremity. Darleene was found to have a left parietal metastatic tumor that was resected by Dr. [name] on 03/31/2011 followed by stereotactic radiation on 04/15/2011. Besse was last seen by [place] on 07/25/2011.She denies any falls or seizures. She thinks she is about 90% improved, although she still has a little problem with her balance. Her  proximal muscle weakness is improved. Strength on the right side is improved. She is markedly cushingoid but without other apparent affects from the high dose of Decadron.  She is off the med since 08/19/2011. She denies any bleeding or bruising problems. Of note today is a platelet count of 316 k.  In looking through Kayla [name]'s chart, she had a platelet count of 63,000 back on 06/28/2011 when she was also on Keppra.She will be off the med in 5 days. At this time she is only taking it once a day. Her latest MRI brain with contrast on 07/29/2011  Shows the Left parietal postoperative hematoma is smaller compared to the prior study. No nodular enhancement is seen in this area suggestive of tumor. No new enhancing lesions are seen. Significant improvement in left parietal edema. Her last CEA  Is 0.5 on 1/21     MEDICAL HISTORY: Past Medical History  Diagnosis Date  . Pericardial effusion     echo 08/13/09  . LVH (left ventricular hypertrophy)     mod/severe. echo 1/11. EF 65-70%   . Hypertension   . Colon cancer     colon/ 2010/surg/chemo  . Brain cancer   . Neuromuscular disorder     peripheral neuropathy feet  . Thrombocytopenia     SURGICAL HISTORY:    Past Surgical History  Procedure Date  . Rotator cuff repair   . Tonsillectomy   . Tubal ligation   . Craniotomy 03/31/11  . Colostomy closure     MEDICATIONS: Current Outpatient Prescriptions  Medication Sig Dispense Refill  . acetaminophen (TYLENOL) 650 MG CR tablet Take 1,300 mg by mouth once as needed. For pain       . dexamethasone (DECADRON) 4 MG tablet Take 4 mg by mouth 3 (three) times daily.        [name] gabapentin (NEURONTIN) 300 MG capsule Take 300 mg by mouth at bedtime.       [name] ibuprofen (ADVIL,MOTRIN) 200 MG tablet Take 200 mg by mouth every 6 (six) hours as needed.        . levETIRAcetam (KEPPRA) 500 MG tablet Take 500 mg by mouth every 12 (twelve) hours.        [name] lisinopril (PRINIVIL,ZESTRIL) 40 MG tablet TAKE ONE TABLET BY MOUTH EVERY DAY  30 tablet  3  . oxycodone (OXY-IR) 5 MG capsule Take 5 mg by mouth every 4 (four) hours as needed.        . pantoprazole (PROTONIX) 40 MG tablet Take 40 mg by mouth daily.        . pentoxifylline (TRENTAL) 400 MG CR tablet Take 400 mg by mouth 2 (two) times daily with a meal.        .  vitamin E 400 UNIT capsule Take 400 Units by mouth daily.          ALLERGIES:  is allergic to sulfonamide derivatives.  REVIEW OF SYSTEMS:  The rest of the 14-point review of system was negative.   Filed Vitals:   08/25/11 1016  BP: 103/71  Pulse: 117  Temp: 98.6 F (37 C)   Wt Readings from Last 3 Encounters:  08/25/11 176 lb 1.6 oz (79.878 kg)  07/25/11 176 lb 14.4 oz (80.241 kg)  06/23/11 176 lb 11.2 oz (80.151 kg)   ECOG Performance status:   PHYSICAL EXAMINATION: Joliyah is floridly cushingoid in no acute distress. Height is 5 feet and 4-1/2 inches. pharynx are benign. No thrush or ulcers. No obvious facial asymmetry. Neck: Without adenopathy. Heart: Normal. Lungs: Normal. There is a right-sided Port-A-Cath . This was last flushed with heparin on 07/28/2010. No axillary adenopathy. Abdomen: Benign with no organomegaly or masses palpable.  Extremities: No peripheral edema. Strength in the upper extremities is fairly good. [name] was able to rise from a sitting position without using her upper  extremity. She was not able to tandem walk. She was able to stand on her left leg somewhat better and then she could stand on her right leg and needed to hold on.    LABORATORY/RADIOLOGY DATA:   Lab 08/25/11 1005  WBC 8.4  HGB 12.5  HCT 37.8  PLT 316  MCV 84.6  MCH 28.0  MCHC 33.1  RDW 17.6*  LYMPHSABS 1.3  MONOABS 1.0*  EOSABS 0.1  BASOSABS 0.0  BANDABS --    CMP   No results found for this basename: NA:5,K:5,CL:5,CO2:5,GLUCOSE:5,BUN:5,CREATININE:5,GFRCGP,:5,CALCIUM:5,MG:5,AST:5,ALT:5,ALKPHOS:5,BILITOT:5 in the last 168 hours      Component Value Date/Time   BILITOT 0.7 07/25/2011 1343   BILITOT 0.60 10/05/2010 1132     Radiology Studies:  Mr [name] WU Contrast  07/29/2011  *RADIOLOGY REPORT*  Clinical Data: Metastatic colon cancer.  Left parietal metastatic deposit resection and radiation therapy.  MRI HEAD WITHOUT AND WITH CONTRAST  Technique:  Multiplanar, multiecho pulse sequences of the brain and surrounding structures were obtained according to standard protocol without and with intravenous contrast  Contrast: 16mL MULTIHANCE GADOBENATE DIMEGLUMINE 529 MG/ML IV SOLN  Comparison: MRI 06/28/2011, 04/08/2011  Findings: Postop left parietal craniotomy for tumor resection. Lesion in the left parietal lobe   is slightly smaller now measuring 21 x 22 mm compared with 26 x 23 mm previously.  This has peripheral low signal on T1 and T2 and most likely is a postoperative hematoma.  Central methemoglobin is present.  There is improvement in surrounding vasogenic edema in the white matter compared with the prior MRI. Diffusion weighted imaging reveals hyperintensity in the white matter surrounding the hematoma.  This was also present on the study of   04/08/2011 and  was not present on the prior study.   This is most likely T2  shine through based on the ADC map.  Signal changes are most likely due to differences in field strength of the magnet.  The study of 03/30/2011 and the study today were both on the 3 Tesla unit.  Post contrast imaging reveals a no significant abnormal enhancement that would suggest recurrent tumor in the area.  Patchy hyperintensities in the subcortical and deep white matter bilaterally are similar to the prior study.  These may be related to chronic ischemia.  Other possibilities would include demyelinating disease and chemo and radiation change.  The confluent hyperintensity in the left parietal  white matter is significantly improved and is most likely is related to vasogenic edema which has resolved improved.  Ventricle size is normal.  Postcontrast imaging reveals no new enhancing lesions suggestive of metastatic disease.  IMPRESSION: Left parietal postoperative hematoma is smaller compared to the prior study.  No nodular enhancement is seen in this area suggestive of tumor.  No new enhancing lesions are seen. Significant improvement in left parietal edema.  Patchy signal abnormality in the white matter is unchanged.  Original Report Authenticated By: Camelia Phenes, M.D.   Mm Digital Screening  08/03/2011  DG SCREEN MAMMOGRAM BILATERAL Bilateral CC and MLO view(s) were taken.  DIGITAL SCREENING MAMMOGRAM WITH CAD: The breast tissue is almost entirely fatty.  No masses or malignant type calcifications are  identified.  Compared with prior studies.  Images were processed with CAD.  IMPRESSION: No specific mammographic evidence of malignancy.  Next screening mammogram is recommended in one  year.  A result letter of this screening mammogram will be mailed directly to the patient.  ASSESSMENT: Negative - BI-RADS 1  Screening mammogram in 1 year. ,       ASSESSMENT AND PLAN:  Clinically, Kayla [name] seems to be doing well on the current program. The etiology for her abnormal MRI of the brain from    06/28/2011 remains somewhat unclear with possibilities being radiation damage, brain abscess, or possibly recurrent tumor. A repeat MRI of the brain  done on 07/29/2011 shows improvement. Missie is to see Dr. [name] on April, and prior to that visit an MRI of the brain with contrast will be performed to follow up on the lesion. Of note is thrombocytopenia today, which may be drug induced from  possibly the Keppra. We plan to follow CBCs in 8 weeks at which time we will check CBC, chemistries, and CEA. Linzey was cautioned about her platelet count and the possibly of bruising and bleeding. She knows to call us if she notices any bleeding or bruising. Koral is on no systemic therapy at this time. As far as we know, she has no active disease with the possible exception of her left parietal lesion. It will be recalled that the CEA was elevated prior to the resection of the brain metastasis. The lung lesions are inapparent on her most recent PET scan from 03/25/2011.

## 2011-08-25 NOTE — Telephone Encounter (Signed)
called pt with 2/28 lab    aom

## 2011-08-26 ENCOUNTER — Other Ambulatory Visit: Payer: Self-pay | Admitting: Oncology

## 2011-08-30 ENCOUNTER — Other Ambulatory Visit (HOSPITAL_COMMUNITY): Payer: Self-pay | Admitting: Oncology

## 2011-08-30 DIAGNOSIS — D696 Thrombocytopenia, unspecified: Secondary | ICD-10-CM

## 2011-08-30 DIAGNOSIS — C7931 Secondary malignant neoplasm of brain: Secondary | ICD-10-CM

## 2011-08-30 DIAGNOSIS — C801 Malignant (primary) neoplasm, unspecified: Secondary | ICD-10-CM

## 2011-08-30 DIAGNOSIS — C189 Malignant neoplasm of colon, unspecified: Secondary | ICD-10-CM

## 2011-09-15 ENCOUNTER — Other Ambulatory Visit (HOSPITAL_BASED_OUTPATIENT_CLINIC_OR_DEPARTMENT_OTHER): Payer: Medicare Other | Admitting: Lab

## 2011-09-15 DIAGNOSIS — C189 Malignant neoplasm of colon, unspecified: Secondary | ICD-10-CM

## 2011-09-15 LAB — LACTATE DEHYDROGENASE: LDH: 273 U/L — ABNORMAL HIGH (ref 94–250)

## 2011-10-21 ENCOUNTER — Other Ambulatory Visit (HOSPITAL_BASED_OUTPATIENT_CLINIC_OR_DEPARTMENT_OTHER): Payer: Medicare Other | Admitting: Lab

## 2011-10-21 ENCOUNTER — Other Ambulatory Visit (HOSPITAL_COMMUNITY): Payer: Medicare Other

## 2011-10-21 ENCOUNTER — Ambulatory Visit
Admission: RE | Admit: 2011-10-21 | Discharge: 2011-10-21 | Disposition: A | Discharge status: Home / Self Care | Payer: Medicare Other | Source: Ambulatory Visit | Attending: Radiation Oncology | Admitting: Radiation Oncology

## 2011-10-21 DIAGNOSIS — C7931 Secondary malignant neoplasm of brain: Secondary | ICD-10-CM

## 2011-10-21 DIAGNOSIS — C189 Malignant neoplasm of colon, unspecified: Secondary | ICD-10-CM

## 2011-10-21 LAB — CBC WITH DIFFERENTIAL/PLATELET
Basophils Absolute: 0.1 10*3/uL (ref 0.0–0.1)
EOS%: 1.5 % (ref 0.0–7.0)
Eosinophils Absolute: 0.1 10*3/uL (ref 0.0–0.5)
HGB: 12.8 g/dL (ref 11.6–15.9)
NEUT#: 6 10*3/uL (ref 1.5–6.5)
RBC: 4.63 10*6/uL (ref 3.70–5.45)
RDW: 16.5 % — ABNORMAL HIGH (ref 11.2–14.5)
lymph#: 1.6 10*3/uL (ref 0.9–3.3)
nRBC: 0 % (ref 0–0)

## 2011-10-21 LAB — COMPREHENSIVE METABOLIC PANEL
ALT: 19 U/L (ref 0–35)
Albumin: 3.8 g/dL (ref 3.5–5.2)
Alkaline Phosphatase: 75 U/L (ref 39–117)
CO2: 26 mEq/L (ref 19–32)
Glucose, Bld: 84 mg/dL (ref 70–99)
Potassium: 3.7 mEq/L (ref 3.5–5.3)
Sodium: 145 mEq/L (ref 135–145)
Total Protein: 6.3 g/dL (ref 6.0–8.3)

## 2011-10-21 LAB — CEA: CEA: 0.9 ng/mL (ref 0.0–5.0)

## 2011-10-21 MED ORDER — GADOBENATE DIMEGLUMINE 529 MG/ML IV SOLN
16.0000 mL | Freq: Once | INTRAVENOUS | Status: AC | PRN
  Administered 2011-10-21: 16 mL via INTRAVENOUS

## 2011-10-24 ENCOUNTER — Encounter: Payer: Self-pay | Admitting: Oncology

## 2011-10-24 ENCOUNTER — Telehealth: Payer: Self-pay | Admitting: Oncology

## 2011-10-24 ENCOUNTER — Ambulatory Visit (HOSPITAL_BASED_OUTPATIENT_CLINIC_OR_DEPARTMENT_OTHER): Payer: Medicare Other | Admitting: Oncology

## 2011-10-24 ENCOUNTER — Ambulatory Visit
Admission: RE | Admit: 2011-10-24 | Discharge: 2011-10-24 | Disposition: A | Discharge status: Home / Self Care | Payer: Medicare Other | Source: Ambulatory Visit | Attending: Radiation Oncology | Admitting: Radiation Oncology

## 2011-10-24 ENCOUNTER — Other Ambulatory Visit: Payer: Self-pay | Admitting: Radiation Therapy

## 2011-10-24 ENCOUNTER — Ambulatory Visit
Admission: RE | Admit: 2011-10-24 | Discharge: 2011-10-24 | Disposition: A | Discharge status: Home / Self Care | Payer: Medicare Other | Source: Ambulatory Visit | Attending: Neurosurgery | Admitting: Neurosurgery

## 2011-10-24 ENCOUNTER — Encounter: Payer: Self-pay | Admitting: Radiation Oncology

## 2011-10-24 ENCOUNTER — Encounter: Payer: Self-pay | Admitting: *Deleted

## 2011-10-24 VITALS — BP 135/94 | HR 101 | Temp 97.6°F | Ht 64.5 in | Wt 176.5 lb

## 2011-10-24 DIAGNOSIS — C801 Malignant (primary) neoplasm, unspecified: Secondary | ICD-10-CM

## 2011-10-24 DIAGNOSIS — C7931 Secondary malignant neoplasm of brain: Secondary | ICD-10-CM

## 2011-10-24 DIAGNOSIS — C189 Malignant neoplasm of colon, unspecified: Secondary | ICD-10-CM

## 2011-10-24 DIAGNOSIS — C7949 Secondary malignant neoplasm of other parts of nervous system: Secondary | ICD-10-CM

## 2011-10-24 DIAGNOSIS — R29898 Other symptoms and signs involving the musculoskeletal system: Secondary | ICD-10-CM

## 2011-10-24 MED ORDER — SODIUM CHLORIDE 0.9 % IJ SOLN
10.0000 mL | Freq: Once | INTRAMUSCULAR | Status: AC
  Administered 2011-10-24: 10 mL via INTRAVENOUS
  Filled 2011-10-24: qty 10

## 2011-10-24 MED ORDER — HEPARIN SOD (PORK) LOCK FLUSH 100 UNIT/ML IV SOLN
500.0000 [IU] | Freq: Once | INTRAVENOUS | Status: AC
  Administered 2011-10-24: 500 [IU] via INTRAVENOUS
  Filled 2011-10-24: qty 5

## 2011-10-24 NOTE — Telephone Encounter (Signed)
appts made and printed for pt aom °

## 2011-10-24 NOTE — Progress Notes (Signed)
Pt seen as f/u  by Dr. [name] and Dr. Kathrynn Running, pt off decadron,keppra,protonix and Oxy IR, still taking temodar,pt has slight head ache still, meds updated 9:03 AM

## 2011-10-24 NOTE — Progress Notes (Signed)
This office note has been dictated.  #[id number]

## 2011-10-24 NOTE — Progress Notes (Signed)
**Note Kayla [name]-Identified via Obfuscation** CC:   [name], M.D. Kayla [name], M.D. [name], MD, [facility] [name]. [name], M.D.  PROBLEM LIST:  1. Diagnosis of colon cancer dates back to 10/27/2008 when Kayla [name]  presented with what turned out to be a bowel perforation through  tumor. Stage at that time was T4 N1 M1 with pulmonary metastatic  disease. The K-ras mutation was detected. Kayla [name] underwent  surgical resection of her tumor with a colostomy at the time of her  surgery on 10/27/2008. The colostomy has been subsequently  reversed on 09/20/2010. Kayla [name] received chemotherapy consisting  of FOLFOX for 10 treatments from 12/10/2008 through 06/02/2009.  She achieved a partial remission from these treatments. She then  received additional chemotherapy with 5-FU, leucovorin, and 5-FU by  continuous infusion along with Avastin from 06/23/2009 through  11/17/2009. Kayla [name] had some peripheral neuropathy in her feet  from the oxaliplatin. She was doing well without evidence of  disease until she developed a right hemiparesthesia in September 2012 and was found to have a 3 x 3 x 2.4 cm, lobulated, enhancing mass  in the left parietal lobe with marked surrounding edema. A PET  scan on 03/25/2011 showed resolution of the previously identified  pulmonary nodules with no residual hypermetabolic activity. The  pericardial effusion, which has been present since diagnosis, was  unchanged. There were also uterine fibroids and a low-density  lesion in the anterior spleen that was stable and not associated  with any hypermetabolic activity. Of note, Kayla [name] had a markedly  elevated CEA up to 24.7 on 03/23/2011. On 06/23/2011, the CEA was  less than 0.5. Dr. [name] resected the recurrent metastatic colon cancer on 03/31/2011.  This was followed by stereotactic radiation on 04/15/2011. 2. Abnormal MRI of the head most recently on 10/21/2011 associated with right  hemiparesis, most consistent with radiation necrosis.    3. Thrombocytopenia with platelet count today of 63,000, possibly due  to Keppra.  4. Hypertension.  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  7. Right-sided Port-A-Cath. This is to be flushed with heparin today.  It was placed on 12/10/2008.   MEDICATIONS: 1. Tylenol 1300 mg daily as needed for pain. 2. Neurontin 300 mg daily or as directed. 3. Ibuprofen 200 mg every 6 hours as needed. 4. Lisinopril 40 mg daily. 5. Trental 400 mg twice daily. 6. Vitamin E 400 units daily.  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 [name] on 03/31/2011, followed by stereotactic radiation on 04/15/2011.  Kayla [name] was last seen by [place] on 08/25/2011 and prior to that on 07/25/2011.  Kayla [name] is accompanied by her friend, Kayla [name].  Since we last saw Kayla [name], she denies any significant changes in her condition. She is no longer on Decadron or Keppra.  She is not aware of any changes in her neurologic exam.  She is having some memory issues, but that is not new.  She says she has a little difficulty walking with some stiffness in her right knee.  She is not falling.  She has not had any seizures or loss of consciousness.  She denies any pain.  In general, she feels well.  Unfortunately, an MRI carried out  on 10/21/2011 shows marked increase in edema and the suspicion of radiation necrosis. Kayla [name] is not aware of any weakness in her right arm.  There are no symptoms to suggest a systemic recurrence of her disease.  PHYSICAL EXAMINATION:  Kayla [name] shows little change.  She remains extremely cushingoid although she is off of Decadron.  Weight today is 176.5 pounds,  height 5 feet 4 1/2 inches, body surface area 1.91 sq. m. Blood pressure 135/94.  Other vital signs are normal.  There is no scleral icterus.  Mouth and pharynx are benign.  No obvious cranial nerve deficits.  Extraocular movements are full.  No facial weakness. There is extra adipose tissue in the neck.  No lymphadenopathy.  Heart and lungs are normal.  There is a Port-A-Cath on the right side that was flushed with heparin today.  Apparently this was last flushed with heparin back on 07/25/2011.  No axillary adenopathy.  Abdomen:  Benign, somewhat obese, with no organomegaly or masses palpable.  Extremities: No peripheral edema.  There is slight pronator drift in the right upper extremity.  Strength was actually pretty good.  There was definite weakness of the right proximal hip flexors, although the rest of the strength in the right lower extremity is fairly well-preserved.  Kayla [name] was able to stand on both her left leg and her right leg, but this was certainly better on her left leg.  Finger-to-nose was normal.  LABORATORY DATA:  From 10/21/2011:  CBC was normal including a platelet count of 235,000.  Platelet count on 02/07 was 316,000.  Platelet count on 01/28 was 64,000 and on 07/25/2011 was 63,000.  The patient did have large platelets.  We were thinking that the low platelet count may have been due to Keppra or possibly immune dysregulation.  Apparently Keppra can cause thrombocytopenia.  Chemistries from 10/21/2011 were entirely normal.  At 1 time Kayla [name] had an elevated LDH, which we were quite concerned about, this specifically 411 on 02/07.  This has come back to normal, now 159 on 04/05.  BUN 15, creatinine 0.72, albumin 3.8.  CEA on 04/05 was 0.9, and on 02/07 was 1.1.  Again, it will be recalled that back on 04/12/2011, the CEA was 24.7.  This came back to normal levels after Kayla [name] underwent resection of her brain tumor on 03/31/2011.  IMAGING STUDIES: 1. MRI of the  head with and without IV contrast on 03/23/2011 showed a     solitary enhancing mass in the left parietal lobe with surrounding     white matter edema, most compatible with solitary metastatic     deposit.  The mass lesion measured 2.7 x 2.5 cm. 2. PET scan from 03/25/2011 showed resolution of the previously-     identified pulmonary nodules with no residual hypermetabolic     activity noted in the neck, chest, abdomen or pelvis to suggest     active malignancy.  There was a pericardial effusion and some     subsegmental atelectasis in the lower lobes.  There were also     uterine fibroids and a low-density lesion in the anterior spleen     that was stable and not associated with hypermetabolic activity. 3. MRI of the head with and without IV contrast showed Stealth     protocol utilized to evaluate left parietal mass.  The mass     measured 3 x 3 x 2.4 cm with marked surrounding vasogenic edema. 4. MRI of the head with and without IV contrast  on 04/08/2011 showed     postsurgical changes of tumor removal in the left parietal lobe and     a postsurgical hematoma with enhancement.  There was 3 mm of     midline shift.  No other metastatic deposits. 5. MRI of the head with and without IV contrast on 06/28/2011 showed a     3 cm area of restricted diffusion lying within the previous area of     left parietal surgical resection for colon cancer.  There was     enhancement of the peripheral aspect of the cavity, marked     restricted diffusion and increasing vasogenic edema.  There was     concern about the development of a brain abscess. 6. MRI of the head with and without IV contrast on 07/29/2011 showed     left parietal postoperative hematoma was smaller compared with the     prior study.  There was no nodular enhancement seen to indicate     tumor.  No new enhancing lesions were seen.  There was significant     improvement in the left parietal edema. 7. Digital screening mammogram was  negative on 08/02/2011. 8. An MRI of the head with and without IV contrast on 10/21/2011     showed development of gyriform enhancement circumferentially along     the margins of the left parietal postoperative space/hematoma.     Marked increase in vasogenic edema was seen.  The pattern was felt     to be most consistent with radiation necrosis rather than recurrent     tumor.  There was further contraction of the hematoma/postoperative     space in the left parietal cortical region, now measuring 17 x 19     mm in transverse diameter as opposed to 22 x 21 mm previously.     Left-to-right shift was 2 mm.  IMPRESSION AND PLAN:  There is concern that Kayla [name] may have radiation necrosis on the basis of the last MRI to be carried out on 10/21/2011. Clinically she is not aware of any changes.  There is still some evidence of weakness on the right arm and leg, particularly proximal right leg.  Kayla [name] is still very cushingoid even though she is not on Decadron at this point.  She is no longer on Keppra.  She is not having any seizures.  Kayla [name] saw Dr. Kathrynn Running this morning.  He has scheduled her for a PET scan with particular attention to the brain to be carried out on 10/27/2011.  Dr. Kathrynn Running mentioned that if this supports the diagnosis of radionecrosis, that he may consider the patient for hyperbaric oxygen.  It is reassuring that the CEA, which was previously quite elevated at 24.7 on 03/23/2011 prior to tumor resection remains in the normal range, most recently 0.9 on 10/21/2011.  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  The MRI also had the same independent conclusion.  Kayla [name]'s Port-A-Cath was flushed with heparin today.  We will plan to see her again in 2 months, at which time we will check CBC, chemistries and CEA.  She will have another Port-A-Cath flush at that time.    ______________________________ Samul Dada, M.D. DSM/MEDQ  D:   10/24/2011  T:  10/24/2011  Job:  [id number]

## 2011-10-24 NOTE — Progress Notes (Signed)
Encounter addended by: [name], MD on: 10/24/2011  8:57 AM<BR>     Documentation filed: Notes Section, Inpatient Notes, Orders

## 2011-10-24 NOTE — Progress Notes (Signed)
Follow up [facility] Brin metastasis MRI 10/21/11   Radiatin treatment 04/15/11 17Gy single fraction

## 2011-10-24 NOTE — Progress Notes (Signed)
Radiation Oncology         [phone]) [phone] ________________________________  Name: Kayla [name] MRN: [id number]  Date: 10/24/2011  DOB: 1950/07/31  Follow-Up Visit Note  CC: Kayla Lung, DO, DO  Murinson, Kayla Fraction, MD   DIAGNOSIS:  This is a 61 year old woman with solitary brain metastasis status post resection and stereotactic radiosurgery to the tumor bed.  INTERVAL SINCE LAST RADIATION:  6 month  Narrative:  The patient returns today for routine follow-up.  Her MRI shows increasing enhancement and mass effect.  She still has mild right weakness without increasing symptoms.                              ALLERGIES:  is allergic to sulfonamide derivatives.  Meds: Current Outpatient Prescriptions  Medication Sig Dispense Refill  . acetaminophen (TYLENOL) 650 MG CR tablet Take 1,300 mg by mouth once as needed. For pain       . dexamethasone (DECADRON) 4 MG tablet Take 4 mg by mouth 3 (three) times daily.        [name] gabapentin (NEURONTIN) 300 MG capsule TAKE ONE CAPSULE BY MOUTH DAILY OR AS DIRECTED  30 capsule  2  . ibuprofen (ADVIL,MOTRIN) 200 MG tablet Take 200 mg by mouth every 6 (six) hours as needed.        . levETIRAcetam (KEPPRA) 500 MG tablet Take 500 mg by mouth every 12 (twelve) hours.        [name] lisinopril (PRINIVIL,ZESTRIL) 40 MG tablet TAKE ONE TABLET BY MOUTH EVERY DAY  30 tablet  3  . oxycodone (OXY-IR) 5 MG capsule Take 5 mg by mouth every 4 (four) hours as needed.        . pantoprazole (PROTONIX) 40 MG tablet Take 40 mg by mouth daily.        . pentoxifylline (TRENTAL) 400 MG CR tablet Take 400 mg by mouth 2 (two) times daily with a meal.        . vitamin E 400 UNIT capsule Take 400 Units by mouth daily.          Physical Findings: The patient is in no acute distress. Patient is alert and oriented. Neuro exam by Dr. [name] revealed mild right pronator drift, but otherwise non-focal.  Lab Findings: Lab Results  Component Value Date   WBC 8.4 10/21/2011   HGB 12.8  10/21/2011   HCT 39.8 10/21/2011   MCV 86.0 10/21/2011   PLT 235 10/21/2011     Radiographic Findings: Mr [name] WJ Contrast  10/21/2011  *RADIOLOGY REPORT*  Clinical Data: Metastatic cancer.  Restaging.  MRI HEAD WITHOUT AND WITH CONTRAST  Technique:  Multiplanar, multiecho pulse sequences of the brain and surrounding structures were obtained according to standard protocol without and with intravenous contrast  Contrast: 16mL MULTIHANCE GADOBENATE DIMEGLUMINE 529 MG/ML IV SOLN  Comparison: 07/29/2011 and multiple previous  Findings: No new metastatic lesions are seen.  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  There is been slight further contraction of the hematoma/postoperative space in the left parietal cortical region now measuring 17 x 19 mm in transverse diameter as opposed to 22 x 21 mm previously.  There is pronounced increase in vasogenic edema emanating outward from that region.  There is abnormal enhancement of the brain along the margins surrounding the postoperative space/hematoma.  This has a gyriform pattern.  The pattern is more consistent with radiation necrosis  than recurrent tumor, though the latter is not dogmatically excluded.  Mass effect from the edema causes some flattening of the posterior portion of the left lateral ventricle.  There is left to right shift of 2 mm.  IMPRESSION: Development of gyriform enhancement circumferentially along the margins of the left parietal postoperative space/hematoma.  Marked increase in vasogenic edema.  The pattern is more consistent with radiation necrosis than recurrent tumor, though the latter cannot be dogmatically excluded.  No physically separated new findings.  Original Report Authenticated By: Thomasenia Sales, M.D.    Impression:  The patient is recovering from the effects of radiation.  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  Plan:  We discussed MRI with her  and showed results.  We suggested Brain PET to distinguish recurrence from radionecrosis, and will follow-up with her on results.  If radionecrosis, we may consider hyperbaric oxygen therapy, if there is no evidence of active extracranial metastatic disease.  _____________________________________  Artist Pais. Kathrynn Running, M.D.   addendum: Of note, the patient had a CEA level drawn on April 5 which was 0.9 which is lower than her previous check at 1.1. Her brain metastasis did appear to cause an increase in CEA prior to resection in August and her CEA normalized following tumor resection. I am not confident that this is a specific predictor of brain metastasis activity, but it may suggest her current findings are more reflective of radionecrosis than active disease.

## 2011-10-25 NOTE — Progress Notes (Signed)
As per above

## 2011-10-27 ENCOUNTER — Encounter (HOSPITAL_COMMUNITY): Payer: Self-pay

## 2011-10-27 ENCOUNTER — Encounter (HOSPITAL_COMMUNITY)
Admission: RE | Admit: 2011-10-27 | Discharge: 2011-10-27 | Disposition: A | Discharge status: Home / Self Care | Payer: Medicare Other | Source: Ambulatory Visit | Attending: Radiation Oncology | Admitting: Radiation Oncology

## 2011-10-27 DIAGNOSIS — C801 Malignant (primary) neoplasm, unspecified: Secondary | ICD-10-CM | POA: Insufficient documentation

## 2011-10-27 DIAGNOSIS — C7949 Secondary malignant neoplasm of other parts of nervous system: Secondary | ICD-10-CM | POA: Insufficient documentation

## 2011-10-27 DIAGNOSIS — C7931 Secondary malignant neoplasm of brain: Secondary | ICD-10-CM | POA: Insufficient documentation

## 2011-10-27 MED ORDER — FLUDEOXYGLUCOSE F - 18 (FDG) INJECTION
10.3000 | Freq: Once | INTRAVENOUS | Status: AC | PRN
  Administered 2011-10-27: 10.3 via INTRAVENOUS

## 2011-10-28 ENCOUNTER — Encounter: Payer: Self-pay | Admitting: *Deleted

## 2011-10-28 DIAGNOSIS — C7931 Secondary malignant neoplasm of brain: Secondary | ICD-10-CM | POA: Insufficient documentation

## 2011-10-28 DIAGNOSIS — C7949 Secondary malignant neoplasm of other parts of nervous system: Secondary | ICD-10-CM | POA: Insufficient documentation

## 2011-10-28 NOTE — Progress Notes (Signed)
Follow up SRS  Brain metastasis radiation treatment 04/15/11=17 Gy in single fraction  Pet scan 10/27/11=hypermetabolic activity in he high left parietal lobe which corresponds to gyriform enhancement on comparison MRI is most consistent with recurrent tumor lesion measures= 3.2x 2.5 cm.  Decadron 4mg   3 x daily po  Allergies:sulfa derivatives

## 2011-10-31 ENCOUNTER — Ambulatory Visit
Admission: RE | Admit: 2011-10-31 | Discharge: 2011-10-31 | Disposition: A | Discharge status: Home / Self Care | Payer: Medicare Other | Source: Ambulatory Visit | Attending: Radiation Oncology | Admitting: Radiation Oncology

## 2011-10-31 ENCOUNTER — Encounter: Payer: Self-pay | Admitting: Radiation Oncology

## 2011-10-31 ENCOUNTER — Ambulatory Visit
Admission: RE | Admit: 2011-10-31 | Discharge: 2011-10-31 | Disposition: A | Discharge status: Home / Self Care | Payer: Medicare Other | Source: Ambulatory Visit | Attending: Neurosurgery | Admitting: Neurosurgery

## 2011-10-31 VITALS — BP 144/100 | HR 98 | Temp 98.4°F | Wt 175.7 lb

## 2011-10-31 DIAGNOSIS — C7931 Secondary malignant neoplasm of brain: Secondary | ICD-10-CM

## 2011-10-31 NOTE — Progress Notes (Signed)
Prior SRS treatment 04/15/11.   Elvevated BP today at 147/ 100 with pulse of 101.   C/o headache today as she points to forehead and nasal area.  Denies any blurred vision, N&V,  Nor ataxia.  Good appetite.   Reports that she has taken her Lisinopril today.  No longer taking Decadron

## 2011-10-31 NOTE — Progress Notes (Signed)
Radiation Oncology         [phone]) [phone] ________________________________  Name: Kayla [name] MRN: [id number]  Date: 10/31/2011  DOB: [birth year]  Follow-Up Visit Note  CC: [name], DO, DO  [name], MD  Diagnosis:   61 yo woman with a solitary brain met s/p resection and SRS  Interval Since Last Radiation:  6 months  Narrative:  The patient returns today to follow-up on her PET                              @LASTCHEM @  Radiographic Findings: Mr Lodema Pilot Contrast  10/21/2011  *RADIOLOGY REPORT*  Clinical Data: Metastatic cancer.  Restaging.  MRI HEAD WITHOUT AND WITH CONTRAST  Technique:  Multiplanar, multiecho pulse sequences of the brain and surrounding structures were obtained according to standard protocol without and with intravenous contrast  Contrast: 16mL MULTIHANCE GADOBENATE DIMEGLUMINE 529 MG/ML IV SOLN  Comparison: 07/29/2011 and multiple previous  Findings: No new metastatic lesions are seen.  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  There is been slight further contraction of the hematoma/postoperative space in the left parietal cortical region now measuring 17 x 19 mm in transverse diameter as opposed to 22 x 21 mm previously.  There is pronounced increase in vasogenic edema emanating outward from that region.  There is abnormal enhancement of the brain along the margins surrounding the postoperative space/hematoma.  This has a gyriform pattern.  The pattern is more consistent with radiation necrosis than recurrent tumor, though the latter is not dogmatically excluded.  Mass effect from the edema causes some flattening of the posterior portion of the left lateral ventricle.  There is left to right shift of 2 mm.  IMPRESSION: Development of gyriform enhancement circumferentially along the margins of the left parietal postoperative space/hematoma.  Marked increase in vasogenic edema.  The pattern is more consistent with  radiation necrosis than recurrent tumor, though the latter cannot be dogmatically excluded.  No physically separated new findings.  Original Report Authenticated By: Thomasenia Sales, M.D.   Nm Pet Metabolic Brain  10/27/2011  *RADIOLOGY REPORT*  Clinical Data:  61 year old female with solitary brain metastasis status post resection and stereotactic radiosurgery.  Last radiation treatment  6 months prior.  Follow-up MRI at 6 months (10/21/2011) demonstrated  gyriform enhancement with differential diagnosis including tumor necrosis versus recurrent tumor.  NUCLEAR MEDICINE BRAIN PET CT  Technique:  10.3 mCi F-18 FDG was injected intravenously via the right antecubital fossa.  Full-ring PET imaging was performed from the vertex to the skull base CT data was obtained and used for attenuation correction and anatomic localization only.  (This was not acquired as a diagnostic CT examination.)  PET data set was fused with MRI data set from 10/21/2011  Comparison:  Brain MRI 10/21/2011  Findings: There is a rim of hypermetabolic activity within the high right parietal lobe which correlates to the gyriform enhancement on comparison MRI. The lesion measures approximately 3.2 x 2.5 cm. The activity level is equal to adjacent gray matter and elevated above adjacent white matter.  The findings are most consistent with the recurrent neoplasm at this site.  No additional abnormal activity within the supratentorial other tentorial the brain.  IMPRESSION: Hypermetabolic activity in the high left parietal lobe which corresponds to gyriform enhancement on comparison MRI is most consistent with recurrent tumor.  Original Report Authenticated By: Genevive Bi,  M.D.   Impression:  The patient radiographic evidence that is suggestive of recurrence, although this has not been histologically confirmed.  Plan:  The patient was seen with Dr. [name] in the office and we discussed the options.  At this point, I would be supportive of a  biopsy to confirm the etiology of her increased enhancement and hypermetabolism in the left parietal lobe.  If recurrence, we could consider salvage fractionated radiotherapy.  _____________________________________  Artist Pais. Kathrynn Running, M.D.

## 2011-11-02 ENCOUNTER — Other Ambulatory Visit: Payer: Self-pay | Admitting: Neurosurgery

## 2011-11-02 ENCOUNTER — Encounter (HOSPITAL_COMMUNITY): Payer: Self-pay | Admitting: Pharmacy Technician

## 2011-11-04 ENCOUNTER — Ambulatory Visit (HOSPITAL_COMMUNITY)
Admission: RE | Admit: 2011-11-04 | Discharge: 2011-11-04 | Disposition: A | Discharge status: Home / Self Care | Payer: Medicare Other | Source: Ambulatory Visit | Attending: Anesthesiology | Admitting: Anesthesiology

## 2011-11-04 ENCOUNTER — Encounter (HOSPITAL_COMMUNITY)
Admission: RE | Admit: 2011-11-04 | Discharge: 2011-11-04 | Disposition: A | Discharge status: Home / Self Care | Payer: Medicare Other | Source: Ambulatory Visit | Attending: Neurosurgery | Admitting: Neurosurgery

## 2011-11-04 ENCOUNTER — Encounter (HOSPITAL_COMMUNITY): Payer: Self-pay

## 2011-11-04 DIAGNOSIS — Z01818 Encounter for other preprocedural examination: Secondary | ICD-10-CM | POA: Insufficient documentation

## 2011-11-04 DIAGNOSIS — Z01812 Encounter for preprocedural laboratory examination: Secondary | ICD-10-CM | POA: Insufficient documentation

## 2011-11-04 DIAGNOSIS — I517 Cardiomegaly: Secondary | ICD-10-CM | POA: Insufficient documentation

## 2011-11-04 DIAGNOSIS — I10 Essential (primary) hypertension: Secondary | ICD-10-CM | POA: Insufficient documentation

## 2011-11-04 HISTORY — DX: Gastro-esophageal reflux disease without esophagitis: K21.9

## 2011-11-04 HISTORY — DX: Encounter for other specified aftercare: Z51.89

## 2011-11-04 HISTORY — DX: Shortness of breath: R06.02

## 2011-11-04 HISTORY — DX: Headache: R51

## 2011-11-04 HISTORY — DX: Reserved for inherently not codable concepts without codable children: IMO0001

## 2011-11-04 HISTORY — DX: Peripheral vascular disease, unspecified: I73.9

## 2011-11-04 HISTORY — DX: Unspecified osteoarthritis, unspecified site: M19.90

## 2011-11-04 HISTORY — DX: Cardiac murmur, unspecified: R01.1

## 2011-11-04 LAB — CBC
MCH: 27.8 pg (ref 26.0–34.0)
Platelets: 258 10*3/uL (ref 150–400)
RBC: 4.71 MIL/uL (ref 3.87–5.11)
RDW: 14.9 % (ref 11.5–15.5)
WBC: 9.8 10*3/uL (ref 4.0–10.5)

## 2011-11-04 LAB — BASIC METABOLIC PANEL
Calcium: 9.7 mg/dL (ref 8.4–10.5)
Creatinine, Ser: 0.8 mg/dL (ref 0.50–1.10)
GFR calc non Af Amer: 80 mL/min — ABNORMAL LOW (ref >90)
Glucose, Bld: 127 mg/dL — ABNORMAL HIGH (ref 70–99)
Sodium: 142 mEq/L (ref 135–145)

## 2011-11-04 LAB — SURGICAL PCR SCREEN: Staphylococcus aureus: NEGATIVE

## 2011-11-04 LAB — TYPE AND SCREEN: ABO/RH(D): A POS

## 2011-11-04 LAB — PROTIME-INR: Prothrombin Time: 13.3 seconds (ref 11.6–15.2)

## 2011-11-04 NOTE — Pre-Procedure Instructions (Addendum)
20 Kayla [name]  11/04/2011   Your procedure is scheduled on:  November 10, 2011   Report to [facility] at 10:30 AM.  Call this number if you have problems the morning of surgery: [phone]   Remember:   Do not eat food:After Midnight.  May have clear liquids: up to 4 Hours before arrival. (6:30 AM)  Clear liquids include soda, tea, black coffee, apple or grape juice, broth.  Take these medicines the morning of surgery with A SIP OF WATER: Tylenol and Gabapentin, STOP taking Vitamin E ,advil and today    Do not wear jewelry, make-up or nail polish.  Do not wear lotions, powders, or perfumes. You may wear deodorant.  Do not shave 48 hours prior to surgery.  Do not bring valuables to the hospital.  Contacts, dentures or bridgework may not be worn into surgery.  Leave suitcase in the car. After surgery it may be brought to your room.  For patients admitted to the hospital, checkout time is 11:00 AM the day of discharge.   Patients discharged the day of surgery will not be allowed to drive home.  Name and phone number of your driver son Kayla [name] [phone]  Special Instructions: CHG Shower Use Special Wash: 1/2 bottle night before surgery and 1/2 bottle morning of surgery.   Please read over the following fact sheets that you were given: Pain Booklet, Coughing and Deep Breathing, Blood Transfusion Information and Surgical Site Infection Prevention

## 2011-11-04 NOTE — Progress Notes (Signed)
Note for allison pa to review echo from 2010 and 2011 in epic, ekg's on chart and hx. No cardiac vixit since 2011 per patient.

## 2011-11-07 NOTE — Consult Note (Signed)
Anesthesiology chart review:  Kayla [name] is a 61 year old female with a history of metastatic colon cancer and a solitary brain metastasis who is scheduled to undergo craniotomy by Dr. [name] on 11/10/2011.  She has a history of a small asymptomatic pericardial effusion. She has undergone previous surgeries with an apparent uneventful anesthetic course with the known pericardial effusion. Her last echocardiogram in Epic is from 08/13/2009.   Kipp Brood M.D.

## 2011-11-09 MED ORDER — CEFAZOLIN SODIUM-DEXTROSE 2-3 GM-% IV SOLR
2.0000 g | INTRAVENOUS | Status: AC
  Administered 2011-11-10: 2 g via INTRAVENOUS
  Filled 2011-11-09: qty 50

## 2011-11-10 ENCOUNTER — Ambulatory Visit (HOSPITAL_COMMUNITY): Payer: Medicare Other | Admitting: Anesthesiology

## 2011-11-10 ENCOUNTER — Inpatient Hospital Stay (HOSPITAL_COMMUNITY)
Admission: RE | Admit: 2011-11-10 | Discharge: 2011-11-12 | DRG: 025 | Disposition: A | Discharge status: Home / Self Care | Payer: Medicare Other | Source: Ambulatory Visit | Attending: Neurosurgery | Admitting: Neurosurgery

## 2011-11-10 ENCOUNTER — Encounter (HOSPITAL_COMMUNITY)
Admission: RE | Disposition: A | Discharge status: Home / Self Care | Payer: Self-pay | Source: Ambulatory Visit | Attending: Neurosurgery

## 2011-11-10 ENCOUNTER — Encounter (HOSPITAL_COMMUNITY): Payer: Self-pay | Admitting: Anesthesiology

## 2011-11-10 ENCOUNTER — Encounter (HOSPITAL_COMMUNITY): Payer: Self-pay | Admitting: Surgery

## 2011-11-10 DIAGNOSIS — Z85038 Personal history of other malignant neoplasm of large intestine: Secondary | ICD-10-CM

## 2011-11-10 DIAGNOSIS — I672 Cerebral atherosclerosis: Secondary | ICD-10-CM | POA: Diagnosis present

## 2011-11-10 DIAGNOSIS — Z79899 Other long term (current) drug therapy: Secondary | ICD-10-CM

## 2011-11-10 DIAGNOSIS — C7949 Secondary malignant neoplasm of other parts of nervous system: Principal | ICD-10-CM | POA: Diagnosis present

## 2011-11-10 DIAGNOSIS — C7931 Secondary malignant neoplasm of brain: Principal | ICD-10-CM | POA: Diagnosis present

## 2011-11-10 DIAGNOSIS — G936 Cerebral edema: Secondary | ICD-10-CM | POA: Diagnosis present

## 2011-11-10 HISTORY — PX: CRANIOTOMY: SHX93

## 2011-11-10 SURGERY — CRANIOTOMY TUMOR EXCISION
Anesthesia: General | Site: Head | Wound class: Clean

## 2011-11-10 MED ORDER — THROMBIN 20000 UNITS EX KIT
PACK | CUTANEOUS | Status: DC | PRN
  Administered 2011-11-10: 14:00:00 via TOPICAL

## 2011-11-10 MED ORDER — BUPIVACAINE-EPINEPHRINE PF 0.25-1:200000 % IJ SOLN
INTRAMUSCULAR | Status: DC | PRN
  Administered 2011-11-10: 10 mL

## 2011-11-10 MED ORDER — MORPHINE SULFATE 2 MG/ML IJ SOLN: 0.0500 mg/kg | INTRAMUSCULAR | Status: DC | PRN

## 2011-11-10 MED ORDER — HYDROCODONE-ACETAMINOPHEN 5-325 MG PO TABS
1.0000 | ORAL_TABLET | ORAL | Status: DC | PRN
  Administered 2011-11-11: 1 via ORAL
  Filled 2011-11-10: qty 1

## 2011-11-10 MED ORDER — LIDOCAINE HCL (CARDIAC) 20 MG/ML IV SOLN
INTRAVENOUS | Status: DC | PRN
  Administered 2011-11-10: 100 mg via INTRAVENOUS

## 2011-11-10 MED ORDER — PANTOPRAZOLE SODIUM 40 MG IV SOLR
40.0000 mg | Freq: Every day | INTRAVENOUS | Status: DC
  Administered 2011-11-10: 40 mg via INTRAVENOUS
  Filled 2011-11-10 (×2): qty 40

## 2011-11-10 MED ORDER — PROPOFOL 10 MG/ML IV EMUL
INTRAVENOUS | Status: DC | PRN
  Administered 2011-11-10: 250 mg via INTRAVENOUS

## 2011-11-10 MED ORDER — HYDROMORPHONE HCL PF 1 MG/ML IJ SOLN: 0.2500 mg | INTRAMUSCULAR | Status: DC | PRN

## 2011-11-10 MED ORDER — BACITRACIN ZINC 500 UNIT/GM EX OINT
TOPICAL_OINTMENT | CUTANEOUS | Status: DC | PRN
  Administered 2011-11-10: 1 via TOPICAL

## 2011-11-10 MED ORDER — POTASSIUM CHLORIDE IN NACL 20-0.9 MEQ/L-% IV SOLN
INTRAVENOUS | Status: DC
  Administered 2011-11-10: 18:00:00 via INTRAVENOUS
  Filled 2011-11-10 (×4): qty 1000

## 2011-11-10 MED ORDER — DEXAMETHASONE SODIUM PHOSPHATE 4 MG/ML IJ SOLN
INTRAMUSCULAR | Status: DC | PRN
  Administered 2011-11-10: 10 mg via INTRAVENOUS

## 2011-11-10 MED ORDER — ONDANSETRON HCL 4 MG/2ML IJ SOLN: 4.0000 mg | Freq: Once | INTRAMUSCULAR | Status: DC | PRN

## 2011-11-10 MED ORDER — DOCUSATE SODIUM 100 MG PO CAPS
100.0000 mg | ORAL_CAPSULE | Freq: Two times a day (BID) | ORAL | Status: DC
  Administered 2011-11-10 – 2011-11-12 (×4): 100 mg via ORAL
  Filled 2011-11-10 (×5): qty 1

## 2011-11-10 MED ORDER — MORPHINE SULFATE 2 MG/ML IJ SOLN: 1.0000 mg | INTRAMUSCULAR | Status: DC | PRN

## 2011-11-10 MED ORDER — MICROFIBRILLAR COLL HEMOSTAT EX PADS
MEDICATED_PAD | CUTANEOUS | Status: DC | PRN
  Administered 2011-11-10: 1 via TOPICAL

## 2011-11-10 MED ORDER — LISINOPRIL 40 MG PO TABS
40.0000 mg | ORAL_TABLET | Freq: Every day | ORAL | Status: DC
  Administered 2011-11-10 – 2011-11-12 (×3): 40 mg via ORAL
  Filled 2011-11-10 (×3): qty 1

## 2011-11-10 MED ORDER — FENTANYL CITRATE 0.05 MG/ML IJ SOLN
INTRAMUSCULAR | Status: DC | PRN
  Administered 2011-11-10 (×4): 50 ug via INTRAVENOUS

## 2011-11-10 MED ORDER — ONDANSETRON HCL 4 MG/2ML IJ SOLN
INTRAMUSCULAR | Status: DC | PRN
  Administered 2011-11-10: 4 mg via INTRAVENOUS

## 2011-11-10 MED ORDER — PROMETHAZINE HCL 25 MG PO TABS: 12.5000 mg | ORAL_TABLET | ORAL | Status: DC | PRN

## 2011-11-10 MED ORDER — BACITRACIN 50000 UNITS IM SOLR
INTRAMUSCULAR | Status: AC
  Filled 2011-11-10: qty 1

## 2011-11-10 MED ORDER — ONDANSETRON HCL 4 MG PO TABS: 4.0000 mg | ORAL_TABLET | ORAL | Status: DC | PRN

## 2011-11-10 MED ORDER — LACTATED RINGERS IV SOLN: INTRAVENOUS | Status: DC | PRN

## 2011-11-10 MED ORDER — SODIUM CHLORIDE 0.9 % IV SOLN
INTRAVENOUS | Status: AC
  Filled 2011-11-10: qty 500

## 2011-11-10 MED ORDER — ACETAMINOPHEN ER 650 MG PO TBCR: 1300.0000 mg | EXTENDED_RELEASE_TABLET | Freq: Three times a day (TID) | ORAL | Status: DC | PRN

## 2011-11-10 MED ORDER — DROPERIDOL 2.5 MG/ML IJ SOLN
INTRAMUSCULAR | Status: DC | PRN
  Administered 2011-11-10: 0.625 mg via INTRAVENOUS

## 2011-11-10 MED ORDER — SODIUM CHLORIDE 0.9 % IR SOLN
Status: DC | PRN
  Administered 2011-11-10: 14:00:00

## 2011-11-10 MED ORDER — ACETAMINOPHEN 650 MG RE SUPP: 650.0000 mg | RECTAL | Status: DC | PRN

## 2011-11-10 MED ORDER — ROCURONIUM BROMIDE 100 MG/10ML IV SOLN
INTRAVENOUS | Status: DC | PRN
  Administered 2011-11-10: 50 mg via INTRAVENOUS

## 2011-11-10 MED ORDER — ACETAMINOPHEN 325 MG PO TABS: 650.0000 mg | ORAL_TABLET | ORAL | Status: DC | PRN

## 2011-11-10 MED ORDER — SODIUM CHLORIDE 0.9 % IR SOLN
Status: DC | PRN
  Administered 2011-11-10: 2000 mL

## 2011-11-10 MED ORDER — SODIUM CHLORIDE 0.9 % IV SOLN
INTRAVENOUS | Status: DC | PRN
  Administered 2011-11-10 (×2): via INTRAVENOUS

## 2011-11-10 MED ORDER — SODIUM CHLORIDE 0.9 % IV SOLN
500.0000 mg | Freq: Two times a day (BID) | INTRAVENOUS | Status: DC
  Administered 2011-11-10 – 2011-11-12 (×4): 500 mg via INTRAVENOUS
  Filled 2011-11-10 (×5): qty 5

## 2011-11-10 MED ORDER — NEOSTIGMINE METHYLSULFATE 1 MG/ML IJ SOLN
INTRAMUSCULAR | Status: DC | PRN
  Administered 2011-11-10: 3 mg via INTRAVENOUS

## 2011-11-10 MED ORDER — LABETALOL HCL 5 MG/ML IV SOLN: 10.0000 mg | INTRAVENOUS | Status: DC | PRN

## 2011-11-10 MED ORDER — DEXAMETHASONE SODIUM PHOSPHATE 4 MG/ML IJ SOLN
4.0000 mg | Freq: Four times a day (QID) | INTRAMUSCULAR | Status: DC
  Administered 2011-11-11 – 2011-11-12 (×3): 4 mg via INTRAVENOUS
  Filled 2011-11-10 (×4): qty 1

## 2011-11-10 MED ORDER — DEXAMETHASONE SODIUM PHOSPHATE 4 MG/ML IJ SOLN
4.0000 mg | Freq: Three times a day (TID) | INTRAMUSCULAR | Status: DC
  Filled 2011-11-10 (×2): qty 1

## 2011-11-10 MED ORDER — GABAPENTIN 300 MG PO CAPS
300.0000 mg | ORAL_CAPSULE | Freq: Every day | ORAL | Status: DC
  Administered 2011-11-10 – 2011-11-11 (×2): 300 mg via ORAL
  Filled 2011-11-10 (×3): qty 1

## 2011-11-10 MED ORDER — ACETAMINOPHEN 500 MG PO TABS: 1000.0000 mg | ORAL_TABLET | Freq: Three times a day (TID) | ORAL | Status: DC | PRN

## 2011-11-10 MED ORDER — MORPHINE SULFATE 4 MG/ML IJ SOLN: 0.0500 mg/kg | INTRAMUSCULAR | Status: DC | PRN

## 2011-11-10 MED ORDER — DEXAMETHASONE SODIUM PHOSPHATE 10 MG/ML IJ SOLN
6.0000 mg | Freq: Four times a day (QID) | INTRAMUSCULAR | Status: AC
  Administered 2011-11-10 – 2011-11-11 (×4): 6 mg via INTRAVENOUS
  Filled 2011-11-10 (×4): qty 0.6

## 2011-11-10 MED ORDER — DEXTROSE 5 % IV SOLN
INTRAVENOUS | Status: DC | PRN
  Administered 2011-11-10: 15:00:00 via INTRAVENOUS

## 2011-11-10 MED ORDER — MIDAZOLAM HCL 5 MG/5ML IJ SOLN
INTRAMUSCULAR | Status: DC | PRN
  Administered 2011-11-10 (×2): 1 mg via INTRAVENOUS

## 2011-11-10 MED ORDER — ONDANSETRON HCL 4 MG/2ML IJ SOLN: 4.0000 mg | INTRAMUSCULAR | Status: DC | PRN

## 2011-11-10 MED ORDER — CEFAZOLIN SODIUM 1-5 GM-% IV SOLN
1.0000 g | Freq: Three times a day (TID) | INTRAVENOUS | Status: AC
  Administered 2011-11-10 – 2011-11-11 (×2): 1 g via INTRAVENOUS
  Filled 2011-11-10 (×2): qty 50

## 2011-11-10 MED ORDER — GLYCOPYRROLATE 0.2 MG/ML IJ SOLN
INTRAMUSCULAR | Status: DC | PRN
  Administered 2011-11-10: 0.4 mg via INTRAVENOUS

## 2011-11-10 SURGICAL SUPPLY — 84 items
BANDAGE GAUZE 4  KLING STR (GAUZE/BANDAGES/DRESSINGS) ×2 IMPLANT
BIT DRILL WIRE PASS 1.3MM (BIT) ×1 IMPLANT
BLADE SURG ROTATE 9660 (MISCELLANEOUS) ×2 IMPLANT
BRUSH SCRUB EZ 1% IODOPHOR (MISCELLANEOUS) ×1 IMPLANT
BRUSH SCRUB EZ PLAIN DRY (MISCELLANEOUS) ×1 IMPLANT
BUR ACORN 6.0 PRECISION (BURR) ×1 IMPLANT
BUR ADDG 1.1 (BURR) IMPLANT
BUR ROUTER D-58 CRANI (BURR) IMPLANT
CANISTER SUCTION 2500CC (MISCELLANEOUS) ×3 IMPLANT
CLIP TI MEDIUM 6 (CLIP) IMPLANT
CLOTH BEACON ORANGE TIMEOUT ST (SAFETY) ×2 IMPLANT
CONT SPEC 4OZ CLIKSEAL STRL BL (MISCELLANEOUS) ×6 IMPLANT
CORDS BIPOLAR (ELECTRODE) ×1 IMPLANT
COVER MAYO STAND STRL (DRAPES) IMPLANT
DRAIN SNY WOU 7FLT (WOUND CARE) IMPLANT
DRAPE CAMERA VIDEO/LASER (DRAPES) IMPLANT
DRAPE LONG LASER MIC (DRAPES) IMPLANT
DRAPE MICROSCOPE LEICA (MISCELLANEOUS) IMPLANT
DRAPE NEUROLOGICAL W/INCISE (DRAPES) ×2 IMPLANT
DRAPE STERI IOBAN 125X83 (DRAPES) IMPLANT
DRAPE WARM FLUID 44X44 (DRAPE) ×2 IMPLANT
DRESSING TELFA 8X3 (GAUZE/BANDAGES/DRESSINGS) ×1 IMPLANT
DRILL WIRE PASS 1.3MM (BIT) ×2
DRSG OPSITE 4X5.5 SM (GAUZE/BANDAGES/DRESSINGS) ×3 IMPLANT
DURAMATRIX ONLAY 3X3 (Plate) ×1 IMPLANT
DURAPREP 6ML APPLICATOR 50/CS (WOUND CARE) ×2 IMPLANT
ELECT CAUTERY BLADE 6.4 (BLADE) ×1 IMPLANT
ELECT REM PT RETURN 9FT ADLT (ELECTROSURGICAL) ×2
ELECTRODE REM PT RTRN 9FT ADLT (ELECTROSURGICAL) ×1 IMPLANT
EVACUATOR 1/8 PVC DRAIN (DRAIN) IMPLANT
EVACUATOR SILICONE 100CC (DRAIN) IMPLANT
FORCEPS BIPOLAR SPETZLER 8 1.0 (NEUROSURGERY SUPPLIES) ×1 IMPLANT
GAUZE SPONGE 4X4 16PLY XRAY LF (GAUZE/BANDAGES/DRESSINGS) IMPLANT
GLOVE BIO SURGEON STRL SZ8 (GLOVE) ×3 IMPLANT
GLOVE BIOGEL PI IND STRL 8 (GLOVE) ×1 IMPLANT
GLOVE BIOGEL PI IND STRL 8.5 (GLOVE) ×1 IMPLANT
GLOVE BIOGEL PI INDICATOR 8 (GLOVE) ×3
GLOVE BIOGEL PI INDICATOR 8.5 (GLOVE) ×1
GLOVE ECLIPSE 7.5 STRL STRAW (GLOVE) ×4 IMPLANT
GLOVE ECLIPSE 8.0 STRL XLNG CF (GLOVE) ×1 IMPLANT
GLOVE EXAM NITRILE LRG STRL (GLOVE) IMPLANT
GLOVE EXAM NITRILE MD LF STRL (GLOVE) ×1 IMPLANT
GLOVE EXAM NITRILE XL STR (GLOVE) IMPLANT
GLOVE EXAM NITRILE XS STR PU (GLOVE) IMPLANT
GOWN BRE IMP SLV AUR LG STRL (GOWN DISPOSABLE) IMPLANT
GOWN BRE IMP SLV AUR XL STRL (GOWN DISPOSABLE) ×2 IMPLANT
GOWN STRL REIN 2XL LVL4 (GOWN DISPOSABLE) ×2 IMPLANT
HEMOSTAT SURGICEL 2X14 (HEMOSTASIS) ×2 IMPLANT
HOOK DURA (MISCELLANEOUS) ×1 IMPLANT
KIT BASIN OR (CUSTOM PROCEDURE TRAY) ×2 IMPLANT
KIT ROOM TURNOVER OR (KITS) ×2 IMPLANT
MARKER SKIN DUAL TIP RULER LAB (MISCELLANEOUS) ×1 IMPLANT
NDL HYPO 25X1 1.5 SAFETY (NEEDLE) ×1 IMPLANT
NEEDLE HYPO 25X1 1.5 SAFETY (NEEDLE) ×2 IMPLANT
NS IRRIG 1000ML POUR BTL (IV SOLUTION) ×4 IMPLANT
PACK CRANIOTOMY (CUSTOM PROCEDURE TRAY) ×2 IMPLANT
PAD ARMBOARD 7.5X6 YLW CONV (MISCELLANEOUS) ×3 IMPLANT
PAD EYE OVAL STERILE LF (GAUZE/BANDAGES/DRESSINGS) IMPLANT
PATTIES SURGICAL .25X.25 (GAUZE/BANDAGES/DRESSINGS) IMPLANT
PATTIES SURGICAL .5 X.5 (GAUZE/BANDAGES/DRESSINGS) IMPLANT
PATTIES SURGICAL .5 X3 (DISPOSABLE) IMPLANT
PATTIES SURGICAL 1/4 X 3 (GAUZE/BANDAGES/DRESSINGS) IMPLANT
PATTIES SURGICAL 1X1 (DISPOSABLE) IMPLANT
PIN MAYFIELD SKULL DISP (PIN) ×1 IMPLANT
RUBBERBAND STERILE (MISCELLANEOUS) IMPLANT
SCREW SELF DRILL HT 1.5/4MM (Screw) ×7 IMPLANT
SPECIMEN JAR SMALL (MISCELLANEOUS) IMPLANT
SPONGE GAUZE 4X4 12PLY (GAUZE/BANDAGES/DRESSINGS) ×1 IMPLANT
SPONGE NEURO XRAY DETECT 1X3 (DISPOSABLE) IMPLANT
SPONGE SURGIFOAM ABS GEL 100 (HEMOSTASIS) ×2 IMPLANT
STAPLER SKIN PROX WIDE 3.9 (STAPLE) ×2 IMPLANT
SUT ETHILON 3 0 FSL (SUTURE) IMPLANT
SUT NURALON 4 0 TR CR/8 (SUTURE) ×4 IMPLANT
SUT SILK 2 0 FS (SUTURE) IMPLANT
SUT VIC AB 2-0 CP2 18 (SUTURE) ×4 IMPLANT
SYR 20ML ECCENTRIC (SYRINGE) ×2 IMPLANT
SYR CONTROL 10ML LL (SYRINGE) ×1 IMPLANT
TIP SONASTAR STD MISONIX 1.9 (TRAY / TRAY PROCEDURE) IMPLANT
TOWEL OR 17X24 6PK STRL BLUE (TOWEL DISPOSABLE) ×2 IMPLANT
TOWEL OR 17X26 10 PK STRL BLUE (TOWEL DISPOSABLE) ×2 IMPLANT
TRAY FOLEY CATH 14FRSI W/METER (CATHETERS) ×2 IMPLANT
TUBE CONNECTING 12X1/4 (SUCTIONS) ×2 IMPLANT
UNDERPAD 30X30 INCONTINENT (UNDERPADS AND DIAPERS) ×1 IMPLANT
WATER STERILE IRR 1000ML POUR (IV SOLUTION) ×2 IMPLANT

## 2011-11-10 NOTE — Transfer of Care (Signed)
Immediate Anesthesia Transfer of Care Note  Patient: Kayla [name]  Procedure(s) Performed: Procedure(s) (LRB): CRANIOTOMY TUMOR EXCISION (N/A)  Patient Location: PACU  Anesthesia Type: General  Level of Consciousness: oriented, sedated, patient cooperative and responds to stimulation  Airway & Oxygen Therapy: Patient Spontanous Breathing and Patient connected to nasal cannula oxygen  Post-op Assessment: Report given to PACU RN, Post -op Vital signs reviewed and stable and Patient moving all extremities  Post vital signs: Reviewed and stable  Complications: No apparent anesthesia complications

## 2011-11-10 NOTE — Anesthesia Postprocedure Evaluation (Signed)
  Anesthesia Post-op Note  Patient: Kayla [name]  Procedure(s) Performed: Procedure(s) (LRB): CRANIOTOMY TUMOR EXCISION (N/A)  Patient Location: PACU  Anesthesia Type: General  Level of Consciousness: awake, alert  and oriented  Airway and Oxygen Therapy: Patient Spontanous Breathing and Patient connected to nasal cannula oxygen  Post-op Pain: mild  Post-op Assessment: Post-op Vital signs reviewed  Post-op Vital Signs: Reviewed  Complications: No apparent anesthesia complications

## 2011-11-10 NOTE — H&P (Signed)
Kayla Hurt, MD Physician Signed Radiation Oncology Progress Notes 10/31/2011 2:48 PM  Related encounter: Follow Up Appointment 15 from 10/31/2011 in [facility]Y   Radiation Oncology [phone]) [phone]  ________________________________  Name: Kayla [name] MRN: [id number]  Date: 10/31/2011 DOB: 1950-08-19  Follow-Up Visit Note  CC: Kayla Lung, DO, DO Kayla Harman, MD  Diagnosis: 61 yo woman with a solitary brain met s/p resection and SRS  Interval Since Last Radiation: 6 months  Narrative: The patient returns today to follow-up on her PET  @LASTCHEM @  Radiographic Findings:  Mr Lodema Pilot Contrast  10/21/2011 *RADIOLOGY REPORT* Clinical Data: Metastatic cancer. Restaging. MRI HEAD WITHOUT AND WITH CONTRAST Technique: Multiplanar, multiecho pulse sequences of the brain and surrounding structures were obtained according to standard protocol without and with intravenous contrast Contrast: 16mL MULTIHANCE GADOBENATE DIMEGLUMINE 529 MG/ML IV SOLN Comparison: 07/29/2011 and multiple previous Findings: No new metastatic lesions are seen. 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 There is been slight further contraction of the hematoma/postoperative space in the left parietal cortical region now measuring 17 x 19 mm in transverse diameter as opposed to 22 x 21 mm previously. There is pronounced increase in vasogenic edema emanating outward from that region. There is abnormal enhancement of the brain along the margins surrounding the postoperative space/hematoma. This has a gyriform pattern. The pattern is more consistent with radiation necrosis than recurrent tumor, though the latter is not dogmatically excluded. Mass effect from the edema causes some flattening of the posterior portion of the left lateral ventricle. There is left to right shift of 2 mm. IMPRESSION: Development of gyriform enhancement circumferentially along the  margins of the left parietal postoperative space/hematoma. Marked increase in vasogenic edema. The pattern is more consistent with radiation necrosis than recurrent tumor, though the latter cannot be dogmatically excluded. No physically separated new findings. Original Report Authenticated By: Thomasenia Sales, M.D.  Nm Pet Metabolic Brain  10/27/2011 *RADIOLOGY REPORT* Clinical Data: 61 year old female with solitary brain metastasis status post resection and stereotactic radiosurgery. Last radiation treatment 6 months prior. Follow-up MRI at 6 months (10/21/2011) demonstrated gyriform enhancement with differential diagnosis including tumor necrosis versus recurrent tumor. NUCLEAR MEDICINE BRAIN PET CT Technique: 10.3 mCi F-18 FDG was injected intravenously via the right antecubital fossa. Full-ring PET imaging was performed from the vertex to the skull base CT data was obtained and used for attenuation correction and anatomic localization only. (This was not acquired as a diagnostic CT examination.) PET data set was fused with MRI data set from 10/21/2011 Comparison: Brain MRI 10/21/2011 Findings: There is a rim of hypermetabolic activity within the high right parietal lobe which correlates to the gyriform enhancement on comparison MRI. The lesion measures approximately 3.2 x 2.5 cm. The activity level is equal to adjacent gray matter and elevated above adjacent white matter. The findings are most consistent with the recurrent neoplasm at this site. No additional abnormal activity within the supratentorial other tentorial the brain. IMPRESSION: Hypermetabolic activity in the high left parietal lobe which corresponds to gyriform enhancement on comparison MRI is most consistent with recurrent tumor. Original Report Authenticated By: Genevive Bi, M.D.  Impression: The patient radiographic evidence that is suggestive of recurrence, although this has not been histologically confirmed.  Plan: The patient was seen  with Dr. [name] in the office and we discussed the options. At this point, I would be supportive of a biopsy to confirm the etiology of  her increased enhancement and hypermetabolism in the left parietal lobe. If recurrence, we could consider salvage fractionated radiotherapy.  _____________________________________  Artist Pais. Kathrynn Running, M.D.

## 2011-11-10 NOTE — Preoperative (Addendum)
Beta Blockers   Reason not to administer Beta Blockers:Not Applicable 

## 2011-11-10 NOTE — Op Note (Signed)
11/10/2011  3:54 PM  PATIENT:  Kayla [name]  61 y.o. female  PRE-OPERATIVE DIAGNOSIS:  brain metastasis to left parietal lobe  POST-OPERATIVE DIAGNOSIS:  brain metastasis to left parietal lobe  PROCEDURE:  Procedure(s) (LRB): CRANIOTOMY TUMOR EXCISION (N/A)  SURGEON:  Surgeon(s) and Role:    * [name], MD - Primary    * Tia Alert, MD - Assisting  PHYSICIAN ASSISTANT:   ASSISTANTS: Poteat, RN   ANESTHESIA:   general  EBL:  Total I/O In: 1050 [I.V.:1050] Out: 125 [Urine:125]  BLOOD ADMINISTERED:none  DRAINS: none   LOCAL MEDICATIONS USED: Marcaine   SPECIMEN:  Excision  DISPOSITION OF SPECIMEN:  PATHOLOGY  COUNTS:  YES  TOURNIQUET:  * No tourniquets in log *  DICTATION: DICTATION: Patient is 61 year old woman with colon cancer. She has developed a right hemiparesis.  She had previously undergone craniotomy for tumor and postoperative SRS.  She developed enhancement around the tumor resection cavity and it was unclear whether this was recurrent tumor or radiation necrosis, so it was elected to take her to surgery for repeat resection and craniotomy for left frontal parietal brain tumor.    Procedure:  Following smooth intubation, patient was placed in left semi-lateral position with blanket roll.  Head was placed in pins and left frontal scalp was shaved and prepped and draped in usual sterile fashion after  Area of planned incision was infiltrated with marcaine. Her previous incision was reopened to expose calvarium.  Previous skull flap was elevated exposing the dura directly overlying the brain mass.  Dura was opened.  A corticotomy was created in the frontal lobe and carried to remove the brain metastasis. A thickly rinded spherical mass was carefully dissected and removed from the brain.  It had mucinous appearing tissue within it.  Additional biopsies of fibrotic material deep to the mass were obtained Hemostasis was assured with irrigation and cotton balls.    Hemostasis was assured and the tumor cavity was lined with Surgicell. The dura was closed with 4-0 neurilon sutures along with tack up neurilon sutures. The bone flap was replaced with plates, the fascia and galea were closed with 2-0 vicryl sutures and the skin was re approximated with staples.  A sterile occlusive dressing was placed.  Patient was returned to a supine position and taken out of head pins, then extubated in the operating room, having tolerated surgery well.  Counts were correct at the end of the case.  PLAN OF CARE: Admit to inpatient   PATIENT DISPOSITION:  PACU - hemodynamically stable.   Delay start of Pharmacological VTE agent (>24hrs) due to surgical blood loss or risk of bleeding: yes

## 2011-11-10 NOTE — Anesthesia Preprocedure Evaluation (Addendum)
Anesthesia Evaluation  Patient identified by MRN, date of birth, ID band Patient awake    Reviewed: Allergy & Precautions, H&P , NPO status , Patient's Chart, lab work & pertinent test results  Airway Mallampati: III TM Distance: >3 FB     Dental  (+) Teeth Intact and Dental Advisory Given   Pulmonary shortness of breath and with exertion, former smoker breath sounds clear to auscultation  Pulmonary exam normal       Cardiovascular Exercise Tolerance: Poor hypertension, Pt. on medications Rhythm:Regular Rate:Normal     Neuro/Psych  Headaches,  Neuromuscular disease    GI/Hepatic Neg liver ROS, GERD-  Controlled,  Endo/Other  negative endocrine ROS  Renal/GU negative Renal ROS  negative genitourinary   Musculoskeletal negative musculoskeletal ROS (+)   Abdominal Normal abdominal exam  (+)   Peds  Hematology negative hematology ROS (+)   Anesthesia Other Findings   Reproductive/Obstetrics negative OB ROS                         Anesthesia Physical Anesthesia Plan  ASA: III  Anesthesia Plan: General   Post-op Pain Management:    Induction: Intravenous  Airway Management Planned: Oral ETT  Additional Equipment:   Intra-op Plan:   Post-operative Plan: Extubation in OR  Informed Consent: I have reviewed the patients History and Physical, chart, labs and discussed the procedure including the risks, benefits and alternatives for the proposed anesthesia with the patient or authorized representative who has indicated his/her understanding and acceptance.   Dental advisory given  Plan Discussed with: CRNA, Anesthesiologist and Surgeon  Anesthesia Plan Comments:        Anesthesia Quick Evaluation

## 2011-11-10 NOTE — Interval H&P Note (Signed)
History and Physical Interval Note:  11/10/2011 1:35 PM  Kayla [name]  has presented today for surgery, with the diagnosis of brain metastasis  The various methods of treatment have been discussed with the patient and family. After consideration of risks, benefits and other options for treatment, the patient has consented to  Procedure(s) (LRB): CRANIOTOMY TUMOR EXCISION (N/A) as a surgical intervention .  The patients' history has been reviewed, patient examined, no change in status, stable for surgery.  I have reviewed the patients' chart and labs.  Questions were answered to the patient's satisfaction.     [name] D  Date of Initial H&P: 10/31/2011  History reviewed, patient examined, no change in status, stable for surgery.

## 2011-11-10 NOTE — Plan of Care (Signed)
Problem: Consults Goal: Diagnosis - Craniotomy Outcome: Progressing Craniotomy for tumor resction

## 2011-11-11 ENCOUNTER — Encounter (HOSPITAL_COMMUNITY): Payer: Self-pay | Admitting: Neurosurgery

## 2011-11-11 MED ORDER — PANTOPRAZOLE SODIUM 40 MG PO TBEC
40.0000 mg | DELAYED_RELEASE_TABLET | Freq: Every day | ORAL | Status: DC
  Administered 2011-11-11: 40 mg via ORAL
  Filled 2011-11-11: qty 1

## 2011-11-11 NOTE — Progress Notes (Signed)
Utilization review completed.  

## 2011-11-11 NOTE — Progress Notes (Signed)
Subjective: Patient reports "I feel fine!"  Objective: Vital signs in last 24 hours: Temp:  [97.8 F (36.6 C)-98.6 F (37 C)] 98.3 F (36.8 C) (04/26 0700) Pulse Rate:  [78-105] 94  (04/26 0700) Resp:  [12-30] 15  (04/26 0700) BP: (125-141)/(71-88) 134/85 mmHg (04/26 0700) SpO2:  [95 %-99 %] 96 % (04/26 0700) Weight:  [82.7 kg (182 lb 5.1 oz)] 82.7 kg (182 lb 5.1 oz) (04/25 1715)  Intake/Output from previous day: 04/25 0701 - 04/26 0700 In: 3390 [P.O.:185; I.V.:2990; IV Piggyback:215] Out: 2450 [Urine:2450] Intake/Output this shift:    Alert, conversant, smiling. No c/o pan or discomfort. drsg intact with small amount of blood on drsg - expected. MAEW. PEARL. No drift. Good strength all extremities. Fluent speech.   Lab Results: No results found for this basename: WBC:2,HGB:2,HCT:2,PLT:2 in the last 72 hours BMET No results found for this basename: NA:2,K:2,CL:2,CO2:2,GLUCOSE:2,BUN:2,CREATININE:2,CALCIUM:2 in the last 72 hours  Studies/Results: No results found.  Assessment/Plan: Stable neurologically post-op day one.  LOS: 1 day  Continue post-craniotomy observation and support.    Georgiann Cocker 11/11/2011, 9:11 AM

## 2011-11-12 ENCOUNTER — Other Ambulatory Visit (HOSPITAL_COMMUNITY): Payer: Self-pay | Admitting: Oncology

## 2011-11-12 DIAGNOSIS — C186 Malignant neoplasm of descending colon: Secondary | ICD-10-CM

## 2011-11-12 NOTE — Discharge Instructions (Signed)
Call Dr. Fredrich Birks office on Monday to schedule appointment for staple removal.  May shower/shampoo hair, but avoid soaking incision site.

## 2011-11-12 NOTE — Plan of Care (Signed)
Problem: Consults Goal: Diagnosis - Craniotomy Outcome: Completed/Met Date Met:  11/12/11 Craniotomy

## 2011-11-12 NOTE — Progress Notes (Signed)
Subjective: Patient reports doing well  Objective: Vital signs in last 24 hours: Temp:  [97.1 F (36.2 C)-98.6 F (37 C)] 98.4 F (36.9 C) (04/27 0800) Pulse Rate:  [54-111] 60  (04/27 0700) Resp:  [11-22] 14  (04/27 0700) BP: (110-157)/(68-95) 157/68 mmHg (04/27 0700) SpO2:  [95 %-99 %] 96 % (04/27 0700)  Intake/Output from previous day: 04/26 0701 - 04/27 0700 In: 2055 [P.O.:720; I.V.:1125; IV Piggyback:210] Out: 1851 [Urine:1850; Stool:1] Intake/Output this shift:    Physical Exam: No hemiparesis.  Incision CDI.  No complaints  Lab Results: No results found for this basename: WBC:2,HGB:2,HCT:2,PLT:2 in the last 72 hours BMET No results found for this basename: NA:2,K:2,CL:2,CO2:2,GLUCOSE:2,BUN:2,CREATININE:2,CALCIUM:2 in the last 72 hours  Studies/Results: No results found.  Assessment/Plan: D/C home.  Decadron taper.  F/U staple removal 10-14 days postop.    LOS: 2 days    Dorian Heckle, MD 11/12/2011, 9:10 AM

## 2011-11-12 NOTE — Discharge Summary (Signed)
Physician Discharge Summary  Patient ID: Kayla [name] MRN: [id number] DOB/AGE: 11-02-1950 61 y.o.  Admit date: 11/10/2011 Discharge date: 11/12/2011  Admission Diagnoses: Left parietal brain mass  Discharge Diagnoses: Same Active Problems:  * No active hospital problems. *    Discharged Condition: good  Hospital Course: Uncomplicated Left parietal craniotomy for tumor vs. Radiation necrosis  Consults: None  Significant Diagnostic Studies: None  Treatments: surgery: Uncomplicated Left parietal craniotomy for tumor vs. Radiation necrosis  Discharge Exam: Blood pressure 157/68, pulse 60, temperature 98.4 F (36.9 C), temperature source Oral, resp. rate 14, height 5\' 3"  (1.6 m), weight 82.7 kg (182 lb 5.1 oz), SpO2 96.00%. Neurologic: Alert and oriented X 3, normal strength and tone. Normal symmetric reflexes. Normal coordination and gait Wound:CDI  Disposition: Home   Discharge Orders    Future Appointments: Provider: Department: Dept Phone: Center:   12/29/2011 1:30 PM Windell Hummingbird [facility] [phone] None   12/29/2011 2:00 PM Samul Dada, MD [facility] [phone] None   12/29/2011 4:00 PM [facility] [phone] None     Medication List  As of 11/12/2011  9:12 AM   STOP taking these medications         pentoxifylline 400 MG CR tablet      vitamin E 400 UNIT capsule         TAKE these medications         acetaminophen 650 MG CR tablet   Commonly known as: TYLENOL   Take 1,300 mg by mouth every 8 (eight) hours as needed. For pain      gabapentin 300 MG capsule   Commonly known as: NEURONTIN   Take 300 mg by mouth daily.      ibuprofen 200 MG tablet   Commonly known as: ADVIL,MOTRIN   Take 200 mg by mouth every 8 (eight) hours as needed. For pain      lisinopril 40 MG tablet   Commonly known as: PRINIVIL,ZESTRIL   Take 40 mg by mouth daily.             Signed: Dorian Heckle, MD 11/12/2011, 9:12 AM

## 2011-11-14 LAB — TISSUE CULTURE: Culture: NO GROWTH

## 2011-11-15 LAB — ANAEROBIC CULTURE

## 2011-11-23 ENCOUNTER — Other Ambulatory Visit (HOSPITAL_COMMUNITY): Payer: Self-pay | Admitting: Oncology

## 2011-11-23 DIAGNOSIS — C7949 Secondary malignant neoplasm of other parts of nervous system: Secondary | ICD-10-CM

## 2011-11-23 DIAGNOSIS — C801 Malignant (primary) neoplasm, unspecified: Secondary | ICD-10-CM

## 2011-11-23 DIAGNOSIS — C7931 Secondary malignant neoplasm of brain: Secondary | ICD-10-CM

## 2011-11-23 DIAGNOSIS — C719 Malignant neoplasm of brain, unspecified: Secondary | ICD-10-CM

## 2011-11-23 DIAGNOSIS — C189 Malignant neoplasm of colon, unspecified: Secondary | ICD-10-CM

## 2011-12-01 ENCOUNTER — Other Ambulatory Visit: Payer: Self-pay | Admitting: Radiation Therapy

## 2011-12-01 DIAGNOSIS — C7931 Secondary malignant neoplasm of brain: Secondary | ICD-10-CM

## 2011-12-08 ENCOUNTER — Ambulatory Visit: Payer: Medicare Other | Admitting: Radiation Oncology

## 2011-12-09 LAB — FUNGUS CULTURE W SMEAR

## 2011-12-29 ENCOUNTER — Ambulatory Visit: Payer: Medicare Other

## 2011-12-29 ENCOUNTER — Encounter: Payer: Self-pay | Admitting: Oncology

## 2011-12-29 ENCOUNTER — Telehealth: Payer: Self-pay | Admitting: Oncology

## 2011-12-29 ENCOUNTER — Ambulatory Visit (HOSPITAL_BASED_OUTPATIENT_CLINIC_OR_DEPARTMENT_OTHER): Payer: Medicare Other | Admitting: Oncology

## 2011-12-29 ENCOUNTER — Other Ambulatory Visit (HOSPITAL_BASED_OUTPATIENT_CLINIC_OR_DEPARTMENT_OTHER): Payer: Medicare Other | Admitting: Lab

## 2011-12-29 VITALS — BP 133/88 | HR 96 | Temp 97.8°F | Ht 63.0 in | Wt 175.2 lb

## 2011-12-29 VITALS — BP 142/94 | HR 91 | Temp 97.6°F

## 2011-12-29 DIAGNOSIS — C7931 Secondary malignant neoplasm of brain: Secondary | ICD-10-CM

## 2011-12-29 DIAGNOSIS — C7800 Secondary malignant neoplasm of unspecified lung: Secondary | ICD-10-CM

## 2011-12-29 DIAGNOSIS — C189 Malignant neoplasm of colon, unspecified: Secondary | ICD-10-CM

## 2011-12-29 DIAGNOSIS — C7949 Secondary malignant neoplasm of other parts of nervous system: Secondary | ICD-10-CM

## 2011-12-29 DIAGNOSIS — C186 Malignant neoplasm of descending colon: Secondary | ICD-10-CM

## 2011-12-29 DIAGNOSIS — C801 Malignant (primary) neoplasm, unspecified: Secondary | ICD-10-CM

## 2011-12-29 LAB — CBC WITH DIFFERENTIAL/PLATELET
BASO%: 0.8 % (ref 0.0–2.0)
EOS%: 1.3 % (ref 0.0–7.0)
HCT: 42.1 % (ref 34.8–46.6)
MCH: 26.6 pg (ref 25.1–34.0)
MCHC: 32.2 g/dL (ref 31.5–36.0)
NEUT%: 72.6 % (ref 38.4–76.8)
RBC: 5.09 10*6/uL (ref 3.70–5.45)
RDW: 15.3 % — ABNORMAL HIGH (ref 11.2–14.5)
WBC: 8.2 10*3/uL (ref 3.9–10.3)
lymph#: 1.6 10*3/uL (ref 0.9–3.3)

## 2011-12-29 LAB — COMPREHENSIVE METABOLIC PANEL
ALT: 13 U/L (ref 0–35)
AST: 19 U/L (ref 0–37)
Calcium: 9.8 mg/dL (ref 8.4–10.5)
Chloride: 107 mEq/L (ref 96–112)
Creatinine, Ser: 0.79 mg/dL (ref 0.50–1.10)
Sodium: 143 mEq/L (ref 135–145)
Total Protein: 6.6 g/dL (ref 6.0–8.3)

## 2011-12-29 MED ORDER — HEPARIN SOD (PORK) LOCK FLUSH 100 UNIT/ML IV SOLN
500.0000 [IU] | Freq: Once | INTRAVENOUS | Status: AC
  Administered 2011-12-29: 500 [IU] via INTRAVENOUS
  Filled 2011-12-29: qty 5

## 2011-12-29 MED ORDER — SODIUM CHLORIDE 0.9 % IJ SOLN
10.0000 mL | INTRAMUSCULAR | Status: DC | PRN
  Administered 2011-12-29: 10 mL via INTRAVENOUS
  Filled 2011-12-29: qty 10

## 2011-12-29 NOTE — Telephone Encounter (Signed)
appts made and printed for pt aom °

## 2011-12-29 NOTE — Progress Notes (Signed)
CC:   [name], M.D. [name], M.D. [name], MD, [facility] [name]. [name], M.D.  PROBLEM LIST:  1. Diagnosis of colon cancer dates back to 10/27/2008 when Kayla [name]  presented with what turned out to be a bowel perforation through  tumor. Stage at that time was T4 N1 M1 with pulmonary metastatic  disease. The K-ras mutation was detected. Kayla [name] underwent  surgical resection of her tumor with a colostomy at the time of her  surgery on 10/27/2008. The colostomy has been subsequently  reversed on 09/20/2010. Clora received chemotherapy consisting  of FOLFOX for 10 treatments from 12/10/2008 through 06/02/2009.  She achieved a partial remission from these treatments. She then  received additional chemotherapy with 5-FU, leucovorin, and 5-FU by  continuous infusion along with Avastin from 06/23/2009 through  11/17/2009. Eric had some peripheral neuropathy in her feet  from the oxaliplatin. She was doing well without evidence of  disease until she developed a right hemiparesthesia in September 2012  and was found to have a 3 x 3 x 2.4 cm, lobulated, enhancing mass  in the left parietal lobe with marked surrounding edema. A PET  scan on 03/25/2011 showed resolution of the previously identified  pulmonary nodules with no residual hypermetabolic activity. The  pericardial effusion, which has been present since diagnosis, was  unchanged. There were also uterine fibroids and a low-density  lesion in the anterior spleen that was stable and not associated  with any hypermetabolic activity. Of note, Leelah had a markedly  elevated CEA up to 24.7 on 03/23/2011. On 06/23/2011, the CEA was  less than 0.5. Dr. [name] resected the recurrent metastatic colon cancer on 03/31/2011. This was followed by stereotactic radiation on 04/15/2011.   The patient underwent a left-sided craniotomy and excision  of the mass involving her left parietal lobe on 11/10/2011  by Dr. [name].  The pathology report was negative for any malignancy.  Pathology report indicated benign brain with fibrosis, hemorrhage, hemosiderin deposition, abundant dystrophic calcifications, and mixed acute and chronic inflammation including foreign body multinucleated giant cells.  2. Abnormal MRI of the head most recently on 10/21/2011 associated with right  hemiparesis, most consistent with radiation necrosis. The patient underwent left-sided craniotomy and excision of the left parietal lobe mass on 11/10/2011.  The pathology report showed no evidence of malignancy.  3. History of thrombocytopenia first noted in October 2012 and again in January 2013 when the platelet count was 64,000, felt to be due most likely to Keppra.    4. Hypertension.  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  7. Right-sided Port-A-Cath was placed on 12/10/2008.   MEDICATIONS: 1. Tylenol 1000 mg every 6 hours as needed. 2. Neurontin 300 mg daily for sensory peripheral neuropathy attributed     to oxaliplatin. 3. Ibuprofen 200 mg every 8 hours as needed. 4. Lisinopril 40 mg daily. 5. Trental 400 mg twice daily. 6. Vitamin E 400 units twice daily.   HISTORY:  I saw Tanique Matney today for followup of her metastatic colon cancer with recurrence in the left parietal region of her brain first detected on 03/23/2011 when Deniyah presented with weakness in her right upper extremity and an elevated CEA of 24.7.  She was found to have a left parietal tumor that was initially resected by Dr. [name] on 03/31/2011.  This was followed by stereotactic radiation on 04/15/2011. I have learned subsequently that Kayla [name]  underwent repeat surgery with a left craniotomy and resection of the residual mass involving the left parietal lobe.  The pathology report failed to show any malignancy.  Chyenne was last seen by [place] on 10/24/2011 prior to her  repeat surgery on 11/10/2011.  She has made an excellent recovery from her surgery.  She is no longer on Decadron or Keppra.  I believe she was on Decadron postoperatively and that was rapidly tapered.  Quandra is here today with her son, Jonny Ruiz.  Her only complaint today is some stiffness in her right knee.  The weakness that she was having in her right arm and leg seems to have improved if not totally resolved at this point.  In general, Ashiya is feeling well.  Energy is good and her appetite also is quite good.  PHYSICAL EXAMINATION:  General:  Caila looks well.  There is certainly evidence of weight gain and some residual cushingoid effect.  Vital Signs:  Weight is 175.2 pounds, height 5 feet 3 inches, body surface area 1.88 sq m.  Blood pressure 133/88.  Other vital signs are normal. HEENT:  There was no scleral icterus.  Mouth and pharynx benign.  No thrush.  There is still cushingoid appearance, which seems to have improved.  There is no peripheral adenopathy palpable.  Heart/lungs: Normal.  There is a Port-A-Cath on the right side.  This will be flushed with heparin today.  We are maintaining this with heparin flushes every 2 months.  Abdomen:  Somewhat obese, nontender, with no organomegaly or masses palpable.  The previous colostomy has been reversed. Extremities:  No peripheral edema or clubbing.  Neurologic:  Seems to be fairly normal.  I really was not able to detect any convincing weakness in the right arm or leg.  Markeita does complain of some stiffness in the right knee.  I did not detect any obvious pathology involving the right knee such as an effusion.  Station and gait also seemed good.  LABORATORY DATA:  Today, white count 8.2, ANC 5.9, hemoglobin 13.6, hematocrit 42.1, platelets 229,000.  Chemistries and CEA today are pending.  Chemistries from 10/21/2011 were entirely normal.  In the past, Trisa had an elevated LDH.  CEA on 10/21/2011 was 0.9.  It will be  recalled that the CEA at one time when Briyanna presented with her brain metastasis back on 03/23/2011 was 24.7 and subsequently normalized after initial surgery and stereotactic radiation.  IMAGING STUDIES:  1. MRI of the head with and without IV contrast on 03/23/2011 showed a  solitary enhancing mass in the left parietal lobe with surrounding  white matter edema, most compatible with solitary metastatic  deposit. The mass lesion measured 2.7 x 2.5 cm.  2. PET scan from 03/25/2011 showed resolution of the previously-  identified pulmonary nodules with no residual hypermetabolic  activity noted in the neck, chest, abdomen or pelvis to suggest  active malignancy. There was a pericardial effusion and some  subsegmental atelectasis in the lower lobes. There were also  uterine fibroids and a low-density lesion in the anterior spleen  that was stable and not associated with hypermetabolic activity.  3. MRI of the head with and without IV contrast showed Stealth  protocol utilized to evaluate left parietal mass. The mass  measured 3 x 3 x 2.4 cm with marked surrounding vasogenic edema.  4. MRI of the head with and without IV contrast on 04/08/2011 showed  postsurgical changes of tumor removal in the left parietal lobe and  a postsurgical hematoma with enhancement. There was 3 mm of  midline shift. No other metastatic deposits.  5. MRI of the head with and without IV contrast on 06/28/2011 showed a  3 cm area of restricted diffusion lying within the previous area of  left parietal surgical resection for colon cancer. There was  enhancement of the peripheral aspect of the cavity, marked  restricted diffusion and increasing vasogenic edema. There was  concern about the development of a brain abscess.  6. MRI of the head with and without IV contrast on 07/29/2011 showed  left parietal postoperative hematoma was smaller compared with the  prior study. There was no nodular enhancement seen to  indicate  tumor. No new enhancing lesions were seen. There was significant  improvement in the left parietal edema.  7. Digital screening mammogram was negative on 08/02/2011.  8. An MRI of the head with and without IV contrast on 10/21/2011  showed development of gyriform enhancement circumferentially along  the margins of the left parietal postoperative space/hematoma.  Marked increase in vasogenic edema was seen. The pattern was felt  to be most consistent with radiation necrosis rather than recurrent  tumor. There was further contraction of the hematoma/postoperative  space in the left parietal cortical region, now measuring 17 x 19  mm in transverse diameter as opposed to 22 x 21 mm previously.  Left-to-right shift was 2 mm. 9. Nuclear medicine brain PET-CT scan carried out on 10/27/2011 showed     hypermetabolic activity in the high left parietal lobe which     corresponds to gyriform enhancement on comparison MRI from     10/21/2011.  This was felt to be most consistent with recurrent     tumor. 10.Chest x-ray, 2 view from 11/04/2011, showed chronic cardiomegaly.     Port-A-Cath was in place.  No acute disease was present.   IMPRESSION AND PLAN:  I am delighted at the results of Maille's  surgery carried out on 11/10/2011.  As stated above., there was no evidence of malignancy.  Ariona's right-sided weakness also seems to be much improved if not totally resolved.  She is asymptomatic and there is currently no evidence for recurrent colon cancer at this time.  We await with interest the results of the CEA.  It would seem to me that the CEA should be a good indicator of recurrent disease.  Lucky's last total body PET scan was on 03/25/2011.  I do not see any rush to repeat these scans.  I will leave scans for the brain up to Dr. [name] and to Dr. Kathrynn Running.  Kadisha may require a brain MRI for at least baseline purposes at some point in the near future.  The Port-A-Cath will  be flushed with heparin today.  I will plan to see Kaetlyn again in 2 months at which time we will again flush the Port-A- Cath with heparin.  We will be checking CBC, chemistries, and CEA, and continuing to follow the CEA closely since this may very well be the first indication of recurrent disease.  Aunica is now out on more than 3 years from the time of diagnosis which goes back to April 2010.    ______________________________ Samul Dada, M.D. DSM/MEDQ  D:  12/29/2011  T:  12/29/2011  Job:  [id number]

## 2011-12-29 NOTE — Progress Notes (Signed)
This office note has been dictated.  #[id number]

## 2012-01-20 ENCOUNTER — Other Ambulatory Visit: Payer: Self-pay | Admitting: *Deleted

## 2012-01-20 DIAGNOSIS — C186 Malignant neoplasm of descending colon: Secondary | ICD-10-CM

## 2012-01-20 MED ORDER — LISINOPRIL 40 MG PO TABS: 40.0000 mg | ORAL_TABLET | Freq: Every day | ORAL | Status: DC

## 2012-01-25 ENCOUNTER — Other Ambulatory Visit: Payer: Self-pay | Admitting: Radiation Therapy

## 2012-01-25 ENCOUNTER — Other Ambulatory Visit: Payer: Self-pay | Admitting: *Deleted

## 2012-01-25 DIAGNOSIS — C7931 Secondary malignant neoplasm of brain: Secondary | ICD-10-CM

## 2012-01-26 ENCOUNTER — Encounter: Payer: Self-pay | Admitting: Radiation Oncology

## 2012-01-27 ENCOUNTER — Ambulatory Visit
Admission: RE | Admit: 2012-01-27 | Discharge: 2012-01-27 | Disposition: A | Discharge status: Home / Self Care | Payer: Medicare Other | Source: Ambulatory Visit | Attending: Radiation Oncology | Admitting: Radiation Oncology

## 2012-01-27 DIAGNOSIS — C7931 Secondary malignant neoplasm of brain: Secondary | ICD-10-CM

## 2012-01-27 MED ORDER — GADOBENATE DIMEGLUMINE 529 MG/ML IV SOLN
17.0000 mL | Freq: Once | INTRAVENOUS | Status: AC | PRN
  Administered 2012-01-27: 17 mL via INTRAVENOUS

## 2012-01-30 ENCOUNTER — Ambulatory Visit
Admission: RE | Admit: 2012-01-30 | Discharge: 2012-01-30 | Disposition: A | Discharge status: Home / Self Care | Payer: Medicare Other | Source: Ambulatory Visit | Attending: Neurosurgery | Admitting: Neurosurgery

## 2012-01-30 ENCOUNTER — Ambulatory Visit
Admission: RE | Admit: 2012-01-30 | Discharge: 2012-01-30 | Disposition: A | Discharge status: Home / Self Care | Payer: Medicare Other | Source: Ambulatory Visit | Attending: Radiation Oncology | Admitting: Radiation Oncology

## 2012-01-30 ENCOUNTER — Encounter: Payer: Self-pay | Admitting: Radiation Oncology

## 2012-01-30 VITALS — BP 152/97 | HR 78 | Temp 98.2°F | Wt 177.3 lb

## 2012-01-30 DIAGNOSIS — C7931 Secondary malignant neoplasm of brain: Secondary | ICD-10-CM

## 2012-01-30 NOTE — Progress Notes (Signed)
Radiation Oncology         [phone]) [phone] ________________________________  Name: CLARISE CHACKO MRN: [id number]  Date: 01/30/2012  DOB: June 06, 1951  Follow-Up Visit Note  CC: [name], DO  [name], MD  Diagnosis:   61 yo woman with a solitary brain met s/p resection and SRS  Interval Since Last Radiation:  9 months  Narrative:  The patient returns today for routine follow-up.  She has no new symptoms other than stiffness.  Denies HA/N/V.  MRI reviewed.  Off Dexamethasone, on Trental and Vitamin E.                              ALLERGIES:  is allergic to sulfonamide derivatives.  Meds: Current Outpatient Prescriptions  Medication Sig Dispense Refill  . acetaminophen (TYLENOL) 500 MG tablet Take 1,000 mg by mouth every 6 (six) hours as needed.      . gabapentin (NEURONTIN) 300 MG capsule TAKE ONE CAPSULE BY MOUTH DAILY OR AS DIRECTED  90 capsule  2  . ibuprofen (ADVIL,MOTRIN) 200 MG tablet Take 200 mg by mouth every 8 (eight) hours as needed. For pain      . lisinopril (PRINIVIL,ZESTRIL) 40 MG tablet Take 1 tablet (40 mg total) by mouth daily.  30 tablet  1  . pentoxifylline (TRENTAL) 400 MG CR tablet Take 400 mg by mouth 2 (two) times daily.      . vitamin E 400 UNIT capsule Take 400 Units by mouth 2 (two) times daily.        Physical Findings: The patient is in no acute distress. Patient is alert and oriented.  weight is 177 lb 4.8 oz (80.423 kg). Her temperature is 98.2 F (36.8 C). Her blood pressure is 152/97 and her pulse is 78. Her oxygen saturation is 98%. .  Neuro exam intact.  No drift/weakness.  Reflexes symmetric (per Dr. [name])  No significant changes.  Lab Findings: Lab Results  Component Value Date   WBC 8.2 12/29/2011   HGB 13.6 12/29/2011   HCT 42.1 12/29/2011   MCV 82.7 12/29/2011   PLT 229 12/29/2011    @LASTCHEM @  Radiographic Findings: Mr [name] WJ Contrast  01/27/2012  *RADIOLOGY REPORT*  Clinical Data: Restaging S R S.  MRI HEAD WITHOUT AND  WITH CONTRAST  Technique:  Multiplanar, multiecho pulse sequences of the brain and surrounding structures were obtained according to standard protocol without and with intravenous contrast  Contrast: 17mL MULTIHANCE GADOBENATE DIMEGLUMINE 529 MG/ML IV SOLN  Comparison: Multiple prior studies, most recent MRI 10/21/2011. Most recent PET metabolic brain scan 10/27/2011.  Findings: The patient underwent surgical removal of the mass 11/10/2011 pathologically was reported as radiation necrosis.  The central spherical mass is no longer present in the left parietal lobe.   There is residual peripheral enhancement of approximately 35 x 40 x 21 mm infiltrating the cortex and deep white matter.  I believe the enhancement has progressed despite removal of the central nidus.  Given the removal of a nearly 2 cm lesion from this area, the residual enhancement and mass effect should be considerably smaller.  There is moderate vasogenic edema throughout the left hemisphere, but slightly decreased from April. No new lesions are seen.  Mild atrophy with chronic microvascular ischemic change.  No midline shift.  Major intracranial vascular structures patent.  IMPRESSION: Findings consistent with progression of disease in this patient with radiation necrosis left parietal lobe. 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  Original Report Authenticated By: Elsie Stain, M.D.    Impression:  The patient has radionecrosis which is stable.  Plan:  Scan in 3 months and then follow-up.  _____________________________________  Artist Pais. Kathrynn Running, M.D.

## 2012-01-30 NOTE — Progress Notes (Signed)
Here for routine follow up post SRS of brain and review of Mri performed 01/27/12. Denies headache, nausea or dizziness.

## 2012-02-10 ENCOUNTER — Other Ambulatory Visit: Payer: Medicare Other

## 2012-02-13 ENCOUNTER — Ambulatory Visit: Payer: Medicare Other

## 2012-02-13 ENCOUNTER — Ambulatory Visit: Payer: Medicare Other | Admitting: Radiation Oncology

## 2012-03-01 ENCOUNTER — Other Ambulatory Visit (HOSPITAL_BASED_OUTPATIENT_CLINIC_OR_DEPARTMENT_OTHER): Payer: Medicare Other | Admitting: Lab

## 2012-03-01 ENCOUNTER — Ambulatory Visit (HOSPITAL_BASED_OUTPATIENT_CLINIC_OR_DEPARTMENT_OTHER): Payer: Medicare Other | Admitting: Oncology

## 2012-03-01 ENCOUNTER — Encounter: Payer: Self-pay | Admitting: Oncology

## 2012-03-01 ENCOUNTER — Telehealth: Payer: Self-pay | Admitting: Oncology

## 2012-03-01 VITALS — BP 129/85 | HR 95 | Temp 98.9°F | Resp 18 | Ht 63.0 in | Wt 175.3 lb

## 2012-03-01 DIAGNOSIS — G622 Polyneuropathy due to other toxic agents: Secondary | ICD-10-CM

## 2012-03-01 DIAGNOSIS — C186 Malignant neoplasm of descending colon: Secondary | ICD-10-CM

## 2012-03-01 DIAGNOSIS — C7931 Secondary malignant neoplasm of brain: Secondary | ICD-10-CM

## 2012-03-01 DIAGNOSIS — C189 Malignant neoplasm of colon, unspecified: Secondary | ICD-10-CM

## 2012-03-01 DIAGNOSIS — R29898 Other symptoms and signs involving the musculoskeletal system: Secondary | ICD-10-CM

## 2012-03-01 LAB — COMPREHENSIVE METABOLIC PANEL
AST: 17 U/L (ref 0–37)
Albumin: 4 g/dL (ref 3.5–5.2)
Alkaline Phosphatase: 117 U/L (ref 39–117)
Glucose, Bld: 141 mg/dL — ABNORMAL HIGH (ref 70–99)
Potassium: 4 mEq/L (ref 3.5–5.3)
Sodium: 140 mEq/L (ref 135–145)
Total Bilirubin: 0.9 mg/dL (ref 0.3–1.2)
Total Protein: 6.8 g/dL (ref 6.0–8.3)

## 2012-03-01 LAB — CBC WITH DIFFERENTIAL/PLATELET
EOS%: 1.8 % (ref 0.0–7.0)
Eosinophils Absolute: 0.1 10*3/uL (ref 0.0–0.5)
LYMPH%: 22.8 % (ref 14.0–49.7)
MCH: 27.4 pg (ref 25.1–34.0)
MCHC: 32.9 g/dL (ref 31.5–36.0)
MCV: 83.3 fL (ref 79.5–101.0)
MONO%: 5.2 % (ref 0.0–14.0)
Platelets: 215 10*3/uL (ref 145–400)
RBC: 5.05 10*6/uL (ref 3.70–5.45)
RDW: 16.6 % — ABNORMAL HIGH (ref 11.2–14.5)

## 2012-03-01 LAB — CEA: CEA: 1.2 ng/mL (ref 0.0–5.0)

## 2012-03-01 MED ORDER — HEPARIN SOD (PORK) LOCK FLUSH 100 UNIT/ML IV SOLN
500.0000 [IU] | Freq: Once | INTRAVENOUS | Status: AC
  Administered 2012-03-01: 500 [IU] via INTRAVENOUS
  Filled 2012-03-01: qty 5

## 2012-03-01 MED ORDER — SODIUM CHLORIDE 0.9 % IJ SOLN
10.0000 mL | Freq: Once | INTRAMUSCULAR | Status: AC
  Administered 2012-03-01: 10 mL via INTRAVENOUS
  Filled 2012-03-01: qty 10

## 2012-03-01 NOTE — Progress Notes (Signed)
This office note has been dictated.  #[id number]

## 2012-03-01 NOTE — Telephone Encounter (Signed)
Gave pt appt calendar for October 2013 lab and MD °

## 2012-03-01 NOTE — Progress Notes (Signed)
CC:   Kayla [name], M.D. Kayla [name], M.D. Kayla Abed, MD, [facility] Kayla [name]. Kayla [name], M.D.  PROBLEM LIST:  1. Diagnosis of colon cancer dates back to 10/27/2008 when Kayla [name]  presented with what turned out to be a bowel perforation through  tumor. Stage at that time was T4 N1 M1 with pulmonary metastatic  disease. The K-ras mutation was detected. Kayla [name] underwent  surgical resection of her tumor with a colostomy at the time of her  surgery on 10/27/2008. The colostomy has been subsequently  reversed on 09/20/2010. Kayla [name] received chemotherapy consisting  of FOLFOX for 10 treatments from 12/10/2008 through 06/02/2009.  She achieved a partial remission from these treatments. She then  received additional chemotherapy with 5-FU, leucovorin, and 5-FU by  continuous infusion along with Avastin from 06/23/2009 through  11/17/2009. Kayla [name] had some peripheral neuropathy in her feet  from the oxaliplatin. She was doing well without evidence of  disease until she developed a right hemiparesthesia in September 2012  and was found to have a 3 x 3 x 2.4 cm, lobulated, enhancing mass  in the left parietal lobe with marked surrounding edema. A PET  scan on 03/25/2011 showed resolution of the previously identified  pulmonary nodules with no residual hypermetabolic activity. The  pericardial effusion, which has been present since diagnosis, was  unchanged. There were also uterine fibroids and a low-density  lesion in the anterior spleen that was stable and not associated  with any hypermetabolic activity. Of note, Kayla [name] had a markedly  elevated CEA up to 24.7 on 03/23/2011. On 06/23/2011, the CEA was  less than 0.5. Dr. [name] resected the recurrent metastatic colon cancer on 03/31/2011. This was followed by stereotactic radiation on 04/15/2011.  The patient underwent a left-sided craniotomy and excision  of the mass involving her left parietal lobe on 11/10/2011  by Dr. [name]. The pathology report was negative for  any malignancy. Pathology report indicated benign brain with fibrosis,  hemorrhage, hemosiderin deposition, abundant dystrophic calcifications,  and mixed acute and chronic inflammation including foreign body  multinucleated giant cells.   2. Abnormal MRI of the head most recently on 10/21/2011 associated with right  hemiparesis, most consistent with radiation necrosis. The patient underwent left-sided craniotomy and excision of the left parietal lobe mass on  11/10/2011. The pathology report showed no evidence of malignancy.   3. History of thrombocytopenia first noted in October 2012  and again in January 2013 when the platelet count was 64,000, felt to be  due most likely to Keppra.   4. Hypertension.  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  7. Right-sided Port-A-Cath was placed on 12/10/2008.    MEDICATIONS:  1. Tylenol 1000 mg every 6 hours as needed.  2. Neurontin 300 mg daily for sensory peripheral neuropathy attributed  to oxaliplatin.  3. Ibuprofen 200 mg every 8 hours as needed.  4. Lisinopril 40 mg daily.  5. Trental 400 mg twice daily.  6. Vitamin E 400 units twice daily.   SMOKING HISTORY:  The patient has smoked intermittently about 2 packs of cigarettes per week since she was a teenager but stopped smoking in 2010.   HISTORY:  Kayla [name] is seen today for followup of her metastatic colon cancer with recurrence in the left parietal region of her brain, 1st detected on 03/23/2011 when Kayla [name] presented with weakness in her right upper extremity and had an elevated CEA at  that time of 24.7.  A left parietal tumor was resected by Dr. [name] on 03/31/2011. Kayla [name] was treated with stereotactic radiation on 04/15/2011.  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 [name].  The pathology report was negative for any malignancy and showed changes consistent with radiation necrosis. Since then, Kayla [name] has done well.  She is no longer on Decadron; however, she remains on Trental and vitamin E.  She was last seen by [place] on 12/29/2011.  Her Port-A-Cath was flushed at that time and also was flushed today.  Kayla [name] is here today with a couple of friends, Kayla [name] and Kayla [name]. Kayla [name] lives in [place], which is [place]. Kayla [name] tells me she is still not driving.  She is aware of some stiffness in her right knee but denies any weakness in her right arm and is not aware of any weakness in her right leg.  She is not falling.  She feels generally well, is without complaints today.  She certainly is without any symptoms to suggest recurrent colon cancer.  She denies any seizures. Energy and appetite are good.  She denies any pain or trouble with her bowels.  Kayla [name] did undergo an MRI of the head with and without IV contrast carried out on 01/27/2012.  The report suggested progression of disease; however, the patient was seen a few days later by Dr. Kathrynn Running and he believes that these changes are all consistent with radiation necrosis. Apparently another MRI is planned for October.  PHYSICAL EXAMINATION:  Kayla [name] shows little change.  She still looks somewhat cushingoid to me.  She has gained a lot a weight, but her weight is stable, now weighing 175.3 pounds, height 5 feet 3 inches, body surface area 1.88 sq m.  Blood pressure 129/85.  Other vital signs are normal.  There is no scleral icterus.  Mouth and pharynx are benign. No obvious cranial nerve deficits.  There is no peripheral adenopathy palpable.  There is a Port-A-Cath on the right side.  This was flushed with heparin today.  We are maintaining this with heparin flushes about every 2 months.  Heart and lungs are normal.  Breasts are not  examined. No axillary adenopathy.  Abdomen:  Obese, nontender with no organomegaly or masses palpable.  The previous colostomy has been reversed. Extremities:  No peripheral edema or clubbing.  Neurologic:  Notable for some weakness of the right hip flexors.  Otherwise, no obvious weakness in the distal right leg or in the right arm.  LABORATORY DATA:  Today, white count 7.7, ANC 5.4, hemoglobin 13.8, hematocrit 42.0, platelets 215,000.  Chemistries and CEA today are pending.  Chemistries from 12/29/2011 were normal except for glucose of 121.  CEA was less than 0.5  on 12/29/2011.  IMAGING STUDIES:  1. MRI of the head with and without IV contrast on 03/23/2011 showed a  solitary enhancing mass in the left parietal lobe with surrounding  white matter edema, most compatible with solitary metastatic  deposit. The mass lesion measured 2.7 x 2.5 cm.  2. PET scan from 03/25/2011 showed resolution of the previously-  identified pulmonary nodules with no residual hypermetabolic  activity noted in the neck, chest, abdomen or pelvis to suggest  active malignancy. There was a pericardial effusion and some  subsegmental atelectasis in the lower lobes. There were also  uterine fibroids and a low-density lesion in the  anterior spleen  that was stable and not associated with hypermetabolic activity.  3. MRI of the head with and without IV contrast showed Stealth  protocol utilized to evaluate left parietal mass. The mass  measured 3 x 3 x 2.4 cm with marked surrounding vasogenic edema.  4. MRI of the head with and without IV contrast on 04/08/2011 showed  postsurgical changes of tumor removal in the left parietal lobe and  a postsurgical hematoma with enhancement. There was 3 mm of  midline shift. No other metastatic deposits.  5. MRI of the head with and without IV contrast on 06/28/2011 showed a  3 cm area of restricted diffusion lying within the previous area of  left parietal surgical resection  for colon cancer. There was  enhancement of the peripheral aspect of the cavity, marked  restricted diffusion and increasing vasogenic edema. There was  concern about the development of a brain abscess.  6. MRI of the head with and without IV contrast on 07/29/2011 showed  left parietal postoperative hematoma was smaller compared with the  prior study. There was no nodular enhancement seen to indicate  tumor. No new enhancing lesions were seen. There was significant  improvement in the left parietal edema.  7. Digital screening mammogram was negative on 08/02/2011.  8. An MRI of the head with and without IV contrast on 10/21/2011  showed development of gyriform enhancement circumferentially along  the margins of the left parietal postoperative space/hematoma.  Marked increase in vasogenic edema was seen. The pattern was felt  to be most consistent with radiation necrosis rather than recurrent  tumor. There was further contraction of the hematoma/postoperative  space in the left parietal cortical region, now measuring 17 x 19  mm in transverse diameter as opposed to 22 x 21 mm previously.  Left-to-right shift was 2 mm.  9. Nuclear medicine brain PET-CT scan carried out on 10/27/2011 showed  hypermetabolic activity in the high left parietal lobe which  corresponds to gyriform enhancement on comparison MRI from  10/21/2011. This was felt to be most consistent with recurrent  tumor.  10.Chest x-ray, 2 view from 11/04/2011, showed chronic cardiomegaly.  Port-A-Cath was in place. No acute disease was present. 11.MRI of the head with and without IV contrast on 01/27/2012 showed findings that were felt to be consistent with progression of disease. This exam was compared with the MRI of 10/21/2011.  It was recognized that the patient underwent surgical resection of the mass on 11/10/2011. There was felt to be on the present exam residual peripheral enhancement of approximately 35 x 40 x 21 mm  with infiltration of the cortex and deep white matter.  It was felt that the enhancement had progressed, despite removal of the central focus.  There was moderate vasogenic edema throughout the left hemisphere but slightly decreased from the MRI from 10/21/2011.  No new lesions were seen.  There was mild atrophy with chronic microvascular ischemic change, but no midline shift.  Major intracranial vascular structures were patent.   ASSESSMENT AND PLAN:  Clinically Kayla [name] seems to be doing quite well. I did notice some weakness of her right hip flexors.  Kayla [name]'s Port-A- Cath was flushed with heparin today.  We are following her CEA for evidence of recurrence.  Of note is the fact that Kayla [name]'s last body PET scan was on 03/25/2011.  She did have a chest x-ray on 11/04/2011. The studies did not show evidence of disease.  We will plan to see Kayla [name] again  in 2 months at which time we will check CBC, chemistries, and CEA.  She will be due for a Port-A-Cath flush at that time.  Apparently she is going to be having an MRI of the head sometime around mid October.    ______________________________ Samul Dada, M.D. DSM/MEDQ  D:  03/01/2012  T:  03/01/2012  Job:  [id number]

## 2012-03-19 ENCOUNTER — Other Ambulatory Visit: Payer: Self-pay | Admitting: Oncology

## 2012-03-19 DIAGNOSIS — C801 Malignant (primary) neoplasm, unspecified: Secondary | ICD-10-CM

## 2012-03-19 DIAGNOSIS — C189 Malignant neoplasm of colon, unspecified: Secondary | ICD-10-CM

## 2012-03-19 DIAGNOSIS — C7931 Secondary malignant neoplasm of brain: Secondary | ICD-10-CM

## 2012-05-03 ENCOUNTER — Telehealth: Payer: Self-pay | Admitting: Oncology

## 2012-05-03 ENCOUNTER — Other Ambulatory Visit (HOSPITAL_BASED_OUTPATIENT_CLINIC_OR_DEPARTMENT_OTHER): Payer: Medicare Other | Admitting: Lab

## 2012-05-03 ENCOUNTER — Ambulatory Visit (HOSPITAL_BASED_OUTPATIENT_CLINIC_OR_DEPARTMENT_OTHER): Payer: Medicare Other | Admitting: Oncology

## 2012-05-03 ENCOUNTER — Encounter: Payer: Self-pay | Admitting: Oncology

## 2012-05-03 VITALS — BP 137/85 | HR 86 | Temp 98.2°F | Resp 20 | Ht 63.0 in | Wt 174.2 lb

## 2012-05-03 DIAGNOSIS — C7931 Secondary malignant neoplasm of brain: Secondary | ICD-10-CM

## 2012-05-03 DIAGNOSIS — C7800 Secondary malignant neoplasm of unspecified lung: Secondary | ICD-10-CM

## 2012-05-03 DIAGNOSIS — C189 Malignant neoplasm of colon, unspecified: Secondary | ICD-10-CM

## 2012-05-03 DIAGNOSIS — G5790 Unspecified mononeuropathy of unspecified lower limb: Secondary | ICD-10-CM

## 2012-05-03 DIAGNOSIS — C186 Malignant neoplasm of descending colon: Secondary | ICD-10-CM

## 2012-05-03 LAB — CBC WITH DIFFERENTIAL/PLATELET
BASO%: 0.8 % (ref 0.0–2.0)
EOS%: 1.6 % (ref 0.0–7.0)
HCT: 41.5 % (ref 34.8–46.6)
LYMPH%: 25.1 % (ref 14.0–49.7)
MCH: 27.8 pg (ref 25.1–34.0)
MCHC: 33.3 g/dL (ref 31.5–36.0)
MCV: 83.4 fL (ref 79.5–101.0)
MONO#: 0.6 10*3/uL (ref 0.1–0.9)
MONO%: 6.6 % (ref 0.0–14.0)
NEUT%: 65.9 % (ref 38.4–76.8)
Platelets: 220 10*3/uL (ref 145–400)
lymph#: 2.3 10*3/uL (ref 0.9–3.3)

## 2012-05-03 LAB — COMPREHENSIVE METABOLIC PANEL (CC13)
ALT: 19 U/L (ref 0–55)
AST: 20 U/L (ref 5–34)
Albumin: 3.6 g/dL (ref 3.5–5.0)
Alkaline Phosphatase: 147 U/L (ref 40–150)
BUN: 17 mg/dL (ref 7.0–26.0)
Creatinine: 0.8 mg/dL (ref 0.6–1.1)
Glucose: 103 mg/dl — ABNORMAL HIGH (ref 70–99)
Potassium: 3.9 mEq/L (ref 3.5–5.1)
Total Bilirubin: 0.8 mg/dL (ref 0.20–1.20)

## 2012-05-03 MED ORDER — SODIUM CHLORIDE 0.9 % IJ SOLN
10.0000 mL | INTRAMUSCULAR | Status: DC | PRN
  Administered 2012-05-03: 10 mL via INTRAVENOUS
  Filled 2012-05-03: qty 10

## 2012-05-03 MED ORDER — HEPARIN SOD (PORK) LOCK FLUSH 100 UNIT/ML IV SOLN
500.0000 [IU] | Freq: Once | INTRAVENOUS | Status: AC
  Administered 2012-05-03: 500 [IU] via INTRAVENOUS
  Filled 2012-05-03: qty 5

## 2012-05-03 NOTE — Progress Notes (Signed)
This office note has been dictated.  #[id number]

## 2012-05-03 NOTE — Telephone Encounter (Signed)
appts made and printed for pt aom °

## 2012-05-04 ENCOUNTER — Ambulatory Visit
Admission: RE | Admit: 2012-05-04 | Discharge: 2012-05-04 | Disposition: A | Discharge status: Home / Self Care | Payer: Medicare Other | Source: Ambulatory Visit | Attending: Radiation Oncology | Admitting: Radiation Oncology

## 2012-05-04 DIAGNOSIS — C7931 Secondary malignant neoplasm of brain: Secondary | ICD-10-CM

## 2012-05-04 MED ORDER — GADOBENATE DIMEGLUMINE 529 MG/ML IV SOLN
16.0000 mL | Freq: Once | INTRAVENOUS | Status: AC | PRN
  Administered 2012-05-04: 16 mL via INTRAVENOUS

## 2012-05-04 NOTE — Progress Notes (Signed)
CC:   [name], M.D. [name], M.D. [name], MD, [name]. [name], M.D.   PROBLEM LIST:  1. Diagnosis of colon cancer dates back to 10/27/2008 when Kayla [name]  presented with what turned out to be a bowel perforation through  tumor. Stage at that time was T4 N1 M1 with pulmonary metastatic  disease. The K-ras mutation was detected. Kayla [name] underwent  surgical resection of her tumor with a colostomy at the time of her  surgery on 10/27/2008. The colostomy has been subsequently  reversed on 09/20/2010. Kayla [name] received chemotherapy consisting  of FOLFOX for 10 treatments from 12/10/2008 through 06/02/2009.  She achieved a partial remission from these treatments. She then  received additional chemotherapy with 5-FU, leucovorin, and 5-FU by  continuous infusion along with Avastin from 06/23/2009 through  11/17/2009. Kayla [name] had some peripheral neuropathy in her feet  from the oxaliplatin. She was doing well without evidence of  disease until she developed a right hemiparesthesia in September 2012  and was found to have a 3 x 3 x 2.4 cm, lobulated, enhancing mass  in the left parietal lobe with marked surrounding edema. A PET  scan on 03/25/2011 showed resolution of the previously identified  pulmonary nodules with no residual hypermetabolic activity. The  pericardial effusion, which has been present since diagnosis, was  unchanged. There were also uterine fibroids and a low-density  lesion in the anterior spleen that was stable and not associated  with any hypermetabolic activity. Of note, Kayla [name] had a markedly  elevated CEA up to 24.7 on 03/23/2011. On 06/23/2011, the CEA was  less than 0.5. Dr. [name] resected the recurrent metastatic colon cancer on 03/31/2011. This was followed by stereotactic radiation on 04/15/2011.  The patient underwent a left-sided craniotomy and excision  of the mass involving her left parietal lobe on 11/10/2011  by Dr.  [name]. The pathology report was negative for  any malignancy. Pathology report indicated benign brain with fibrosis,  hemorrhage, hemosiderin deposition, abundant dystrophic calcifications,  and mixed acute and chronic inflammation including foreign body  multinucleated giant cells.  2. Abnormal MRI of the head most recently on 01/27/2012 associated with right  hemiparesis, most consistent with radiation necrosis. The patient underwent left-sided craniotomy and excision of the left parietal lobe mass on  11/10/2011. The pathology report showed no evidence of malignancy.  3. History of thrombocytopenia first noted in October 2012  and again in January 2013 when the platelet count was 64,000, felt to be  due most likely to Keppra.  4. Hypertension.  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  7. Right-sided Port-A-Cath was placed on 12/10/2008.    MEDICATIONS:  1. Tylenol 1000 mg every 6 hours as needed.  2. Neurontin 300 mg daily for sensory peripheral neuropathy attributed  to oxaliplatin.  3. Ibuprofen 200 mg every 8 hours as needed.  4. Lisinopril 40 mg daily.  5. Trental 400 mg twice daily.  6. Vitamin E 400 units twice daily.    SMOKING HISTORY: The patient has smoked intermittently about 2 packs of  cigarettes per week since she was a teenager but stopped smoking in  2010.    HISTORY:  I saw [name] today for followup of her metastatic colon cancer with recurrence in the left parietal region of her brain, first detected on 03/23/2011, when Kayla [name] presented with weakness in her right upper extremity, and had an elevated CEA  of 24.7.  Details of Kayla [name]'s treatments, which include 2 resections of the area by Dr. [name], as well as stereotactic radiation, are detailed above.  Kayla [name] was last seen by [place] on 03/01/2012.  The most recent MRI of the head with and without IV contrast was on  01/27/2012; however, Kayla [name] is due for another MRI tomorrow, October 18th.  Kayla [name] has done well since her last visit on 03/01/2012.  She is here with her friend Kayla [name], who was here with her on her last visit, as well as Kayla [name].  [name] lives in [place], which is [place] of [place]. Kayla [name] is still not driving.  She denies any sense of weakness in her right arm or leg.  Speech is fine.  She denies any seizures.  In short she is without any complaints today.  She denies any falling.  We are flushing her Port-A-Cath with heparin every 2 months.  The Port-A-Cath was flushed with heparin today.  PHYSICAL EXAMINATION:  Kayla [name] shows little change.  Weight is 174.2 pounds, height 5 feet 3 inches, body surface area 1.87 sq m.  Blood pressure 137/85.  Other vital signs are normal.  There is no scleral icterus.  Mouth and pharynx are benign.  I believe the cushingoid effects from the Decadron have resolved.  There is no adenopathy.  There is a right-sided Port-A-Cath that was flushed with heparin today.  Heart lungs are normal.  Breasts are not examined.  Abdomen:  Benign with no organomegaly or masses palpable.  Extremities:  No peripheral edema or clubbing.  Neurologic exam is normal.  I was not able to detect any weakness.  Kayla [name] was able to stand on her right leg and do a slight knee bend.  Strength seems excellent throughout.  LABORATORY DATA:  Today, white count 9.0, ANC 5.9, hemoglobin 13.8, hematocrit 41.5, platelets 220,000.  Chemistries today were essentially normal.  Glucose was 103.  CEA is pending.  CEA from 03/01/2012 was 1.2.  IMAGING STUDIES:  1. MRI of the head with and without IV contrast on 03/23/2011 showed a  solitary enhancing mass in the left parietal lobe with surrounding  white matter edema, most compatible with solitary metastatic  deposit. The mass lesion measured 2.7 x 2.5 cm.  2. PET scan from 03/25/2011 showed resolution of the previously-  identified  pulmonary nodules with no residual hypermetabolic  activity noted in the neck, chest, abdomen or pelvis to suggest  active malignancy. There was a pericardial effusion and some  subsegmental atelectasis in the lower lobes. There were also  uterine fibroids and a low-density lesion in the anterior spleen  that was stable and not associated with hypermetabolic activity.  3. MRI of the head with and without IV contrast showed Stealth  protocol utilized to evaluate left parietal mass. The mass  measured 3 x 3 x 2.4 cm with marked surrounding vasogenic edema.  4. MRI of the head with and without IV contrast on 04/08/2011 showed  postsurgical changes of tumor removal in the left parietal lobe and  a postsurgical hematoma with enhancement. There was 3 mm of  midline shift. No other metastatic deposits.  5. MRI of the head with and without IV contrast on 06/28/2011 showed a  3 cm area of restricted diffusion lying within the previous area of  left parietal surgical resection for colon cancer. There was  enhancement of the peripheral aspect of the cavity, marked  restricted diffusion and increasing vasogenic edema. There was  concern about the development  of a brain abscess.  6. MRI of the head with and without IV contrast on 07/29/2011 showed  left parietal postoperative hematoma was smaller compared with the  prior study. There was no nodular enhancement seen to indicate  tumor. No new enhancing lesions were seen. There was significant  improvement in the left parietal edema.  7. Digital screening mammogram was negative on 08/02/2011.  8. An MRI of the head with and without IV contrast on 10/21/2011  showed development of gyriform enhancement circumferentially along  the margins of the left parietal postoperative space/hematoma.  Marked increase in vasogenic edema was seen. The pattern was felt  to be most consistent with radiation necrosis rather than recurrent  tumor. There was further  contraction of the hematoma/postoperative  space in the left parietal cortical region, now measuring 17 x 19  mm in transverse diameter as opposed to 22 x 21 mm previously.  Left-to-right shift was 2 mm.  9. Nuclear medicine brain PET-CT scan carried out on 10/27/2011 showed  hypermetabolic activity in the high left parietal lobe which  corresponds to gyriform enhancement on comparison MRI from  10/21/2011. This was felt to be most consistent with recurrent  tumor.  10.Chest x-ray, 2 view from 11/04/2011, showed chronic cardiomegaly.  Port-A-Cath was in place. No acute disease was present.  11.MRI of the head with and without IV contrast on 01/27/2012 showed  findings that were felt to be consistent with progression of disease.  This exam was compared with the MRI of 10/21/2011. It was recognized  that the patient underwent surgical resection of the mass on 11/10/2011.  There was felt to be on the present exam residual peripheral enhancement  of approximately 35 x 40 x 21 mm with infiltration of the cortex and  deep white matter. It was felt that the enhancement had progressed,  despite removal of the central focus. There was moderate vasogenic  edema throughout the left hemisphere but slightly decreased from the MRI  from 10/21/2011. No new lesions were seen. There was mild atrophy with  chronic microvascular ischemic change, but no midline shift. Major  intracranial vascular structures were patent. 12.MRI of head to be done on 05/04/2012.   IMPRESSION AND PLAN:  Kayla [name] continues to do well at this time .  She is asymptomatic with regard to her neuro status.  It will be recalled that her tumor was diagnosed back in April 2010, thus Kayla [name] is now out 3 and half years from the time of diagnosis.  She is out now about 1 year from the time of the left brain recurrence.  As stated, Kayla [name] is due for an MRI of the head tomorrow, 05/04/2012.  In looking through her chart, Kayla [name]'s last  PET scan was carried out on 03/25/2011.  It will be recalled that the CEA had been markedly elevated when Kayla [name] had her recurrence.  Following the CEA therefore should be fairly reliable. Nevertheless, at some point, we will probably want to check another PET scan.  Kayla [name]'s Port-A-Cath was flushed with heparin today.  We will plan to see her again in approximately 2 months, at which time we will check CBC, chemistries, CEA.  She will be due for another port flush.  She can see Norina Buzzard at that time.  We may want to schedule a PET scan sometime in the next few months.    ______________________________ Samul Dada, M.D. DSM/MEDQ  D:  05/03/2012  T:  05/04/2012  Job:  [id number]

## 2012-05-06 ENCOUNTER — Encounter: Payer: Self-pay | Admitting: Radiation Oncology

## 2012-05-06 NOTE — Progress Notes (Signed)
  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  She is stable with no new changes.                          ALLERGIES:  is allergic to sulfonamide derivatives.  Meds: Current Outpatient Prescriptions  Medication Sig Dispense Refill  . acetaminophen (TYLENOL) 500 MG tablet Take 1,000 mg by mouth every 6 (six) hours as needed.      [name] ibuprofen (ADVIL,MOTRIN) 200 MG tablet Take 200 mg by mouth every 8 (eight) hours as needed. For pain      . lisinopril (PRINIVIL,ZESTRIL) 40 MG tablet TAKE ONE TABLET BY MOUTH DAILY  30 tablet  1  . pentoxifylline (TRENTAL) 400 MG CR tablet Take 1 tablet (400 mg total) by mouth 2 (two) times daily.  60 tablet  5  . vitamin E 400 UNIT capsule Take 400 Units by mouth 2 (two) times daily.      [name] gabapentin (NEURONTIN) 300 MG capsule TAKE ONE CAPSULE BY MOUTH DAILY OR AS DIRECTED  90 capsule  2    Physical Findings: The patient is in no acute distress. Patient is alert and oriented.  weight is 176 lb 14.4 oz (80.241 kg). Her oral temperature is 97.5 F (36.4 C). Her blood pressure is 134/80 and her pulse is 96. Her respiration is 20. Marland KitchenNeuro exam unchanged. No significant changes.  Radiographic Findings: Mr [name] WJ Contrast  05/04/2012  *RADIOLOGY REPORT*  Clinical Data: Stereotactic radiosurgery treatment planning. Metastatic colon cancer.  Post surgery and stereotactic radiation of metastatic disease followed by development of radiation necrosis.  Post resection of radiation necrosis (11/10/2011). Follow-up.  MRI HEAD WITHOUT AND WITH CONTRAST   Technique:  Multiplanar, multiecho pulse sequences of the brain and surrounding structures were obtained according to standard protocol without and with intravenous contrast  Contrast: 16mL MULTIHANCE GADOBENATE DIMEGLUMINE 529 MG/ML IV SOLN  Comparison: Most recent examinations 01/27/2012.  Findings: Post left parietal lobe surgery for resection of tumor and radiation necrosis.  Change in configuration of enhancement in this region suggesting contraction of the post-therapy changes with minimal decrease in degree of vasogenic edema.  Continued surveillance recommended.  No new abnormality noted.  IMPRESSION: Post left parietal lobe surgery for resection of tumor and radiation necrosis.  Change in configuration of enhancement in this region suggesting contraction of the post-therapy changes with minimal decrease in degree of vasogenic edema.  Continued surveillance recommended.  No new abnormality noted.   Original Report Authenticated By: Fuller Canada, M.D.    Impression:  The patient is recovering from the effects of surgery and radiation and has no evidence of recurrent disease.  Plan:  MRI in 3 months.  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  _____________________________________  Artist Pais Kathrynn Running, M.D.  And   [name], M.D.

## 2012-05-07 ENCOUNTER — Ambulatory Visit
Admission: RE | Admit: 2012-05-07 | Discharge: 2012-05-07 | Disposition: A | Discharge status: Home / Self Care | Payer: Medicare Other | Source: Ambulatory Visit | Attending: Radiation Oncology | Admitting: Radiation Oncology

## 2012-05-07 ENCOUNTER — Encounter: Payer: Self-pay | Admitting: Radiation Oncology

## 2012-05-07 VITALS — BP 134/80 | HR 96 | Temp 97.5°F | Resp 20 | Wt 176.9 lb

## 2012-05-07 DIAGNOSIS — C719 Malignant neoplasm of brain, unspecified: Secondary | ICD-10-CM

## 2012-05-07 MED ORDER — PENTOXIFYLLINE ER 400 MG PO TBCR: 400.0000 mg | EXTENDED_RELEASE_TABLET | Freq: Two times a day (BID) | ORAL | Status: DC

## 2012-05-07 NOTE — Progress Notes (Signed)
Pt denies pain, fatigue, loss of appetite, dizziness, nausea, unsteady gait. Does occasionally have mild headaches, takes Advil w/good relief. Pt alert, oriented x 3. Not on steroid therapy at this time.

## 2012-05-07 NOTE — Progress Notes (Signed)
As above.

## 2012-05-17 ENCOUNTER — Other Ambulatory Visit: Payer: Self-pay | Admitting: Radiation Therapy

## 2012-05-17 DIAGNOSIS — C7949 Secondary malignant neoplasm of other parts of nervous system: Secondary | ICD-10-CM

## 2012-05-17 DIAGNOSIS — C7931 Secondary malignant neoplasm of brain: Secondary | ICD-10-CM

## 2012-05-20 ENCOUNTER — Other Ambulatory Visit: Payer: Self-pay | Admitting: Oncology

## 2012-06-19 ENCOUNTER — Other Ambulatory Visit: Payer: Self-pay | Admitting: Oncology

## 2012-06-29 ENCOUNTER — Other Ambulatory Visit: Payer: Self-pay | Admitting: Oncology

## 2012-06-29 DIAGNOSIS — Z139 Encounter for screening, unspecified: Secondary | ICD-10-CM

## 2012-07-03 ENCOUNTER — Other Ambulatory Visit (HOSPITAL_BASED_OUTPATIENT_CLINIC_OR_DEPARTMENT_OTHER): Payer: Medicare Other | Admitting: Lab

## 2012-07-03 ENCOUNTER — Telehealth: Payer: Self-pay | Admitting: Oncology

## 2012-07-03 ENCOUNTER — Ambulatory Visit (HOSPITAL_COMMUNITY)
Admission: RE | Admit: 2012-07-03 | Discharge: 2012-07-03 | Disposition: A | Discharge status: Home / Self Care | Payer: Medicare Other | Source: Ambulatory Visit | Attending: Family | Admitting: Family

## 2012-07-03 ENCOUNTER — Encounter: Payer: Self-pay | Admitting: Family

## 2012-07-03 ENCOUNTER — Ambulatory Visit (HOSPITAL_BASED_OUTPATIENT_CLINIC_OR_DEPARTMENT_OTHER): Payer: Medicare Other | Admitting: Family

## 2012-07-03 VITALS — BP 139/93 | HR 106 | Temp 97.4°F | Resp 20 | Ht 63.0 in | Wt 177.6 lb

## 2012-07-03 DIAGNOSIS — C189 Malignant neoplasm of colon, unspecified: Secondary | ICD-10-CM

## 2012-07-03 DIAGNOSIS — C186 Malignant neoplasm of descending colon: Secondary | ICD-10-CM

## 2012-07-03 DIAGNOSIS — C7931 Secondary malignant neoplasm of brain: Secondary | ICD-10-CM | POA: Insufficient documentation

## 2012-07-03 DIAGNOSIS — J984 Other disorders of lung: Secondary | ICD-10-CM | POA: Insufficient documentation

## 2012-07-03 LAB — COMPREHENSIVE METABOLIC PANEL (CC13)
ALT: 14 U/L (ref 0–55)
AST: 11 U/L (ref 5–34)
Alkaline Phosphatase: 127 U/L (ref 40–150)
BUN: 19 mg/dL (ref 7.0–26.0)
Calcium: 10.2 mg/dL (ref 8.4–10.4)
Chloride: 105 mEq/L (ref 98–107)
Creatinine: 0.9 mg/dL (ref 0.6–1.1)
Total Bilirubin: 0.44 mg/dL (ref 0.20–1.20)

## 2012-07-03 LAB — CBC WITH DIFFERENTIAL/PLATELET
BASO%: 0.2 % (ref 0.0–2.0)
Basophils Absolute: 0 10*3/uL (ref 0.0–0.1)
EOS%: 0.2 % (ref 0.0–7.0)
HCT: 45 % (ref 34.8–46.6)
HGB: 15 g/dL (ref 11.6–15.9)
LYMPH%: 11.5 % — ABNORMAL LOW (ref 14.0–49.7)
MCH: 27.8 pg (ref 25.1–34.0)
MCHC: 33.2 g/dL (ref 31.5–36.0)
MCV: 83.6 fL (ref 79.5–101.0)
MONO%: 7 % (ref 0.0–14.0)
NEUT%: 81.1 % — ABNORMAL HIGH (ref 38.4–76.8)
Platelets: 239 10*3/uL (ref 145–400)

## 2012-07-03 NOTE — Telephone Encounter (Signed)
Gave patient appt for February 2014 lab and MD

## 2012-07-03 NOTE — Patient Instructions (Addendum)
Please contact us at [phone] if you have any questions or concerns. 

## 2012-07-03 NOTE — Telephone Encounter (Signed)
Pt aware of a lab appt on 10/24/11

## 2012-07-03 NOTE — Progress Notes (Signed)
Patient ID: Kayla [name], female   DOB: 12-19-1950, 61 y.o.   MRN: [id number] CSN: [id number]  CC: [name], MD  [name], MD [name], MD, [name]  [name]. [name], MD  Problem List: [name] is a 61 y.o. African-American female with a problem list consisting of:  1. Diagnosis of colon cancer dates back to 10/27/2008 when Kayla [name] presented with what turned out to be a bowel perforation through  tumor. Stage at that time was T4 N1 M1 with pulmonary metastatic disease. The K-ras mutation was detected. Kayla [name] underwent  surgical resection of her tumor with a colostomy at the time of her surgery on 10/27/2008. The colostomy has been subsequently  reversed on 09/20/2010. Kayla [name] received chemotherapy consisting of FOLFOX for 10 treatments from 12/10/2008 through 06/02/2009. She achieved a partial remission from these treatments. She then received additional chemotherapy with 5-FU, leucovorin, and 5-FU by continuous infusion along with Avastin from 06/23/2009 through 11/17/2009. 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 She was doing well without evidence of disease until she developed a right hemiparesthesia in September 2012 and was found to have a 3 x 3 x 2.4 cm, lobulated, enhancing mass in the left parietal lobe with marked surrounding edema. A PET scan on 03/25/2011 showed resolution of the previously identified pulmonary nodules with no residual hypermetabolic activity. The  pericardial effusion, which has been present since diagnosis, was unchanged. There were also uterine fibroids and a low-density lesion in the anterior spleen that was stable and not associated with any hypermetabolic activity. Of note, Kayla [name] had a markedly elevated CEA up to 24.7 on 03/23/2011. On 06/23/2011, the CEA was less than 0.5. Dr. [name] resected the recurrent metastatic colon cancer on 03/31/2011. This was followed by stereotactic radiation on  04/15/2011. The patient underwent a left-sided craniotomy and excision of the mass involving her left parietal lobe on 11/10/2011 by Dr. [name]. The pathology report was negative for any malignancy. Pathology report indicated benign brain with fibrosis, hemorrhage, hemosiderin deposition, abundant dystrophic calcifications, and mixed acute and chronic inflammation including foreign body multinucleated giant cells.  2. Abnormal MRI of the head most recently on 01/27/2012 associated with right hemiparesis, most consistent with radiation necrosis. The patient underwent left-sided craniotomy and excision of the left parietal lobe mass on 11/10/2011. The pathology report showed no evidence of malignancy.  3. History of thrombocytopenia first noted in October 2012 and again in January 2013 when the platelet count was 64,000, felt to be  due most likely to Keppra.  4. Hypertension.  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  7. Right-sided Port-A-Cath was placed on 12/10/2008.   Dr. [name] and I saw Kayla [name] today for follow up of her metastatic colon cancer with recurrence in the left parietal region of her brain, first detected on 03/23/2011, when [name] presented with weakness in her right upper extremity, and had an elevated CEA of 24.7. Details of Kayla [name]'s treatments, which include 2 resections of the area by Dr. [name], as well as stereotactic radiation, are detailed above. Kayla [name] was last seen by [place] on 05/03/2012. The most recent MRI of the head with and without IV contrast was on 05/04/2012 showed post left parietal lobe surgery for resection of tumor and radiation necrosis. Change in configuration of enhancement in this region suggesting contraction of the post-therapy changes with minimal decrease in degree of vasogenic  edema. Continued surveillance was recommended. No new abnormalities were noted.  A copy of  the MRI was given to the patient and the results were discussed with her.    Kayla [name] has done well since her last visit on 05/03/2012. She is here with her brother.  The patient lives in [place], [place] is [place]. [name] is still not driving. She denies any sense of weakness in her right arm or leg. She denies any falling.Speech is fine. She denies any seizures and she is without any complaints today.  She has seen Dr. Lenell Antu recently for back spasms and right knee pain.  She was prescribed a Prednisone taper (which will be completed tomorrow) and Flexeril.  She states her spasms and knee pain resolve with the medications.  She denies any other symptomatology.  We are flushing her Port-A-Cath with heparin every 2 months. The Port-A-Cath was flushed with heparin today.     Past Medical History: Past Medical History  Diagnosis Date  . Pericardial effusion     echo 08/13/09  . LVH (left ventricular hypertrophy)     mod/severe. echo 1/11. EF 65-70%   . Hypertension   . Thrombocytopenia   . 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 Allergy     sulfa  . Colon cancer     colon/ 2010/surg/chemo  . History of radiation therapy 04/15/11    17 Gy single fraction  l parietal brain metastais  . Heart murmur   . Peripheral vascular disease   . Shortness of breath   . Blood transfusion   . GERD (gastroesophageal reflux disease)   . Headache   . Neuromuscular disorder     peripheral neuropathy feet  . Arthritis     Surgical History: Past Surgical History  Procedure Date  . Rotator cuff repair     rt  . Tonsillectomy   . Tubal ligation   . Craniotomy 03/31/11    left parietal mass resection  . Colostomy closure   . Tonsillectomy   . Colon surgery   . Portacath placement     10  . Craniotomy 11/10/2011    Procedure: CRANIOTOMY TUMOR EXCISION;  Surgeon: [name], MD;  Location: [facility];  Service: Neurosurgery;  Laterality: N/A;  Craniotomy for  Biopsy of Tumor    Current Medications: Current Outpatient Prescriptions  Medication Sig Dispense Refill  . acetaminophen (TYLENOL) 500 MG tablet Take 1,000 mg by mouth every 6 (six) hours as needed.      . cyclobenzaprine (FLEXERIL) 10 MG tablet Take 10 mg by mouth 3 (three) times daily as needed.      . gabapentin (NEURONTIN) 300 MG capsule TAKE ONE CAPSULE BY MOUTH DAILY OR AS DIRECTED  90 capsule  2  . ibuprofen (ADVIL,MOTRIN) 200 MG tablet Take 200 mg by mouth every 8 (eight) hours as needed. For pain      . lisinopril (PRINIVIL,ZESTRIL) 40 MG tablet TAKE ONE TABLET BY MOUTH EVERY DAY  30 tablet  0  . pentoxifylline (TRENTAL) 400 MG CR tablet Take 1 tablet (400 mg total) by mouth 2 (two) times daily.  60 tablet  5  . predniSONE (DELTASONE) 10 MG tablet Take 10 mg by mouth daily. Prednisone taper almost complete      . vitamin E 400 UNIT capsule Take 400 Units by mouth 2 (two) times daily.        Allergies: Allergies  Allergen Reactions  . Sulfonamide  Derivatives Rash    Family History: Family History  Problem Relation Age of Onset  . Cancer Mother   . Diabetes Father   . Coronary artery disease Father   . Gout Brother     Social History: History  Substance Use Topics  . Smoking status: Former Smoker -- 0.2 packs/day for 37 years    Types: Cigarettes    Quit date: 11/03/2008  . Smokeless tobacco: Never Used     Comment: 30 pack year hx   . Alcohol Use: No     Comment: rarely    Review of Systems: 10 Point review of systems was completed and is negative except as noted above.   Physical Exam:   Blood pressure 139/93, pulse 106, temperature 97.4 F (36.3 C), temperature source Oral, resp. rate 20, height 5\' 3"  (1.6 m), weight 177 lb 9.6 oz (80.559 kg).  General appearance: Alert, cooperative, well nourished, no apparent distress Head: Normocephalic, without obvious abnormality, atraumatic Eyes: Conjunctivae/corneas clear, PERRLA, EOMI Nose: Nares, septum and  mucosa are normal, no drainage or sinus tenderness Neck: No adenopathy, supple, symmetrical, trachea midline, thyroid not enlarged, no tenderness Resp: Diminished breath sounds bibasilar, CTA Cardio: Regular rate and rhythm, S1, S2 normal, no murmur, click, rub or gallop, right chest Port-A-Cath is without signs of infection GI: Soft, non-tender, distended, normoactive bowel sounds, no organomegaly Extremities: Extremities normal, atraumatic, no cyanosis or edema Lymph nodes: Cervical, supraclavicular, and axillary nodes normal Neurologic: Grossly normal, CN II - XII intact, no focal deficits   Laboratory Data: Results for orders placed in visit on 07/03/12 (from the past 48 hour(s))  CBC WITH DIFFERENTIAL     Status: Abnormal   Collection Time   07/03/12 10:45 AM      Component Value Range Comment   WBC 16.2 (*) 3.9 - 10.3 10e3/uL    NEUT# 13.1 (*) 1.5 - 6.5 10e3/uL    HGB 15.0  11.6 - 15.9 g/dL    HCT 45.4  09.8 - 11.9 %    Platelets 239  145 - 400 10e3/uL    MCV 83.6  79.5 - 101.0 fL    MCH 27.8  25.1 - 34.0 pg    MCHC 33.2  31.5 - 36.0 g/dL    RBC 1.47  8.29 - 5.62 10e6/uL    RDW 14.9 (*) 11.2 - 14.5 %    lymph# 1.9  0.9 - 3.3 10e3/uL    MONO# 1.1 (*) 0.1 - 0.9 10e3/uL    Eosinophils Absolute 0.0  0.0 - 0.5 10e3/uL    Basophils Absolute 0.0  0.0 - 0.1 10e3/uL    NEUT% 81.1 (*) 38.4 - 76.8 %    LYMPH% 11.5 (*) 14.0 - 49.7 %    MONO% 7.0  0.0 - 14.0 %    EOS% 0.2  0.0 - 7.0 %    BASO% 0.2  0.0 - 2.0 %   COMPREHENSIVE METABOLIC PANEL (CC13)     Status: Normal   Collection Time   07/03/12 10:45 AM      Component Value Range Comment   Sodium 144  136 - 145 mEq/L    Potassium 4.1  3.5 - 5.1 mEq/L    Chloride 105  98 - 107 mEq/L    CO2 29  22 - 29 mEq/L    Glucose 96  70 - 99 mg/dl    BUN 13.0  7.0 - 86.5 mg/dL    Creatinine 0.9  0.6 - 1.1 mg/dL    Total  Bilirubin 0.44  0.20 - 1.20 mg/dL    Alkaline Phosphatase 127  40 - 150 U/L    AST 11  5 - 34 U/L    ALT 14  0 - 55  U/L    Total Protein 7.4  6.4 - 8.3 g/dL    Albumin 3.5  3.5 - 5.0 g/dL    Calcium 40.9  8.4 - 10.4 mg/dL   LACTATE DEHYDROGENASE (CC13)     Status: Normal   Collection Time   07/03/12 10:45 AM      Component Value Range Comment   LDH 147  125 - 245 U/L 05/24/12 - NOTE new reference range.     Imaging Studies: 1. MRI of the head with and without IV contrast on 03/23/2011 showed a solitary enhancing mass in the left parietal lobe with surrounding white matter edema, most compatible with solitary metastatic deposit. The mass lesion measured 2.7 x 2.5 cm.  2. PET scan from 03/25/2011 showed resolution of the previously-identified pulmonary nodules with no residual hypermetabolic activity noted in the neck, chest, abdomen or pelvis to suggest active malignancy. There was a pericardial effusion and some subsegmental atelectasis in the lower lobes. There were also uterine fibroids and a low-density lesion in the anterior spleen that was stable and not associated with hypermetabolic activity.  3. MRI of the head with and without IV contrast showed Stealth protocol utilized to evaluate left parietal mass. The mass measured 3 x 3 x 2.4 cm with marked surrounding vasogenic edema.  4. MRI of the head with and without IV contrast on 04/08/2011 showed postsurgical changes of tumor removal in the left parietal lobe and a postsurgical hematoma with enhancement. There was 3 mm of midline shift. No other metastatic deposits.  5. MRI of the head with and without IV contrast on 06/28/2011 showed a 3 cm area of restricted diffusion lying within the previous area of left parietal surgical resection for colon cancer. There was enhancement of the peripheral aspect of the cavity, marked restricted diffusion and increasing vasogenic edema. There was concern about the development of a brain abscess.  6. MRI of the head with and without IV contrast on 07/29/2011 showed left parietal postoperative hematoma was smaller compared  with the prior study. There was no nodular enhancement seen to indicate tumor. No new enhancing lesions were seen. There was significant improvement in the left parietal edema.  7. Digital screening mammogram was negative on 08/02/2011.  8. An MRI of the head with and without IV contrast on 10/21/2011 showed development of gyriform enhancement circumferentially along  the margins of the left parietal postoperative space/hematoma. Marked increase in vasogenic edema was seen. 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 There was further contraction of the hematoma/postoperative  space in the left parietal cortical region, now measuring 17 x 19 mm in transverse diameter as opposed to 22 x 21 mm previously.  Left-to-right shift was 2 mm.  9. Nuclear medicine brain PET-CT scan carried out on 10/27/2011 showed hypermetabolic activity in the high left parietal lobe which  corresponds to gyriform enhancement on comparison MRI from 10/21/2011. This was felt to be most consistent with recurrent tumor.  10.Chest x-ray, 2 view from 11/04/2011, showed chronic cardiomegaly. Port-A-Cath was in place. No acute disease was present.  11.MRI of the head with and without IV contrast on 01/27/2012 showed findings that were felt to be consistent with progression of disease. This exam was compared  with the MRI of 10/21/2011. It was recognized that the patient underwent surgical resection of the mass on 11/10/2011. There was felt to be on the present exam residual peripheral enhancement of approximately 35 x 40 x 21 mm with infiltration of the cortex and deep white matter. It was felt that the enhancement had progressed, despite removal of the central focus. There was moderate vasogenic edema throughout the left hemisphere but slightly decreased from the MRI from 10/21/2011. No new lesions were seen. There was mild atrophy with chronic microvascular ischemic change, but no  midline shift. Major intracranial vascular structures were patent.  12.MRI of head with and without contrast on 05/04/2012 showed post left parietal lobe surgery for resection of tumor and radiation necrosis. Change in configuration of enhancement in this region suggesting contraction of the post-therapy changes with minimal decrease in degree of vasogenic edema. Continued surveillance recommended. No new abnormality noted. 13.Chest x-ray 2 view on 07/03/2012 showed bones demineralized with endplate spur formation thoracic spine.  Enlargement of cardiac silhouette. Lingular scarring.   Impression/Plan: Kayla [name] continues to do well at this time . She is asymptomatic with regard to her neuro status and colon cancer. It will be recalled that her tumor was diagnosed back in April 2010, and she is now over 3.5 years from the time of diagnosis. She is also now about 1 year from the time of the left brain recurrence.  It will be recalled that the CEA had been markedly elevated when the patient had her recurrence. Following the CEA therefore should be fairly reliable. Nonetheless, we will check another PET scan on or about 10/16/2012 which is about 1 week prior to her next office visit.  Kayla [name] last PET scan was on 03/25/2011.   Ms. Elie'  Port-A-Cath was flushed with heparin today. She is scheduled for another Port-A-Cath flush on 08/28/2012.  We will plan to see her again in approximately 4 months (10/23/2012), at which time we will check CBC, chemistries, CEA. She will be due for another Port-A-Cath flush at that time also.  Kayla [name] and her brother are encouraged to contact us in the interim if she has any questions or concerns.    Larina Bras, NP-C 07/03/2012, 4:13 PM

## 2012-07-03 NOTE — Telephone Encounter (Signed)
Pt sent to x-ray today

## 2012-07-05 ENCOUNTER — Other Ambulatory Visit: Payer: Self-pay | Admitting: Oncology

## 2012-07-05 ENCOUNTER — Telehealth: Payer: Self-pay | Admitting: Oncology

## 2012-07-05 ENCOUNTER — Telehealth: Payer: Self-pay

## 2012-07-05 ENCOUNTER — Encounter: Payer: Self-pay | Admitting: Oncology

## 2012-07-05 DIAGNOSIS — I517 Cardiomegaly: Secondary | ICD-10-CM

## 2012-07-05 DIAGNOSIS — C189 Malignant neoplasm of colon, unspecified: Secondary | ICD-10-CM

## 2012-07-05 DIAGNOSIS — I319 Disease of pericardium, unspecified: Secondary | ICD-10-CM

## 2012-07-05 NOTE — Telephone Encounter (Signed)
S/w pt that Dr [name] will be ordering a 2D Echo and to expect a call with the schedule.

## 2012-07-05 NOTE — Telephone Encounter (Signed)
S/w pt re echo appt for 12/26 @ 9 am @ [facility].

## 2012-07-05 NOTE — Progress Notes (Unsigned)
A 2D Echo cardiogram has been ordered.  CXR from 07/03/12 showed that heart size had increased.

## 2012-07-12 ENCOUNTER — Ambulatory Visit (HOSPITAL_COMMUNITY)
Admission: RE | Admit: 2012-07-12 | Discharge: 2012-07-12 | Disposition: A | Discharge status: Home / Self Care | Payer: Medicare Other | Source: Ambulatory Visit | Attending: Oncology | Admitting: Oncology

## 2012-07-12 ENCOUNTER — Telehealth: Payer: Self-pay

## 2012-07-12 DIAGNOSIS — I319 Disease of pericardium, unspecified: Secondary | ICD-10-CM | POA: Insufficient documentation

## 2012-07-12 NOTE — Telephone Encounter (Signed)
S/w pt that her echo showed no change from prior scan.

## 2012-07-12 NOTE — Progress Notes (Signed)
  Echocardiogram 2D Echocardiogram has been performed.  Kayla [name] 07/12/2012, 9:55 AM

## 2012-07-12 NOTE — Progress Notes (Signed)
Quick Note:  Please notify patient and call/fax these results to patient's doctors. ______ 

## 2012-07-20 ENCOUNTER — Other Ambulatory Visit: Payer: Self-pay | Admitting: Oncology

## 2012-07-23 ENCOUNTER — Other Ambulatory Visit: Payer: Self-pay | Admitting: Medical Oncology

## 2012-07-23 MED ORDER — LISINOPRIL 40 MG PO TABS: 40.0000 mg | ORAL_TABLET | Freq: Every day | ORAL | Status: DC

## 2012-08-06 ENCOUNTER — Ambulatory Visit (HOSPITAL_COMMUNITY)
Admission: RE | Admit: 2012-08-06 | Discharge: 2012-08-06 | Disposition: A | Discharge status: Home / Self Care | Payer: Medicare Other | Source: Ambulatory Visit | Attending: Oncology | Admitting: Oncology

## 2012-08-06 DIAGNOSIS — Z139 Encounter for screening, unspecified: Secondary | ICD-10-CM

## 2012-08-06 DIAGNOSIS — Z1231 Encounter for screening mammogram for malignant neoplasm of breast: Secondary | ICD-10-CM | POA: Insufficient documentation

## 2012-08-10 ENCOUNTER — Ambulatory Visit
Admission: RE | Admit: 2012-08-10 | Discharge: 2012-08-10 | Disposition: A | Discharge status: Home / Self Care | Payer: Medicare Other | Source: Ambulatory Visit | Attending: Radiation Oncology | Admitting: Radiation Oncology

## 2012-08-10 DIAGNOSIS — C7931 Secondary malignant neoplasm of brain: Secondary | ICD-10-CM

## 2012-08-10 DIAGNOSIS — C7949 Secondary malignant neoplasm of other parts of nervous system: Secondary | ICD-10-CM

## 2012-08-10 MED ORDER — GADOBENATE DIMEGLUMINE 529 MG/ML IV SOLN
16.0000 mL | Freq: Once | INTRAVENOUS | Status: AC | PRN
  Administered 2012-08-10: 16 mL via INTRAVENOUS

## 2012-08-13 ENCOUNTER — Encounter: Payer: Self-pay | Admitting: Radiation Oncology

## 2012-08-13 ENCOUNTER — Ambulatory Visit
Admission: RE | Admit: 2012-08-13 | Discharge: 2012-08-13 | Disposition: A | Discharge status: Home / Self Care | Payer: Medicare Other | Source: Ambulatory Visit | Attending: Radiation Oncology | Admitting: Radiation Oncology

## 2012-08-13 DIAGNOSIS — C719 Malignant neoplasm of brain, unspecified: Secondary | ICD-10-CM

## 2012-08-13 DIAGNOSIS — C7931 Secondary malignant neoplasm of brain: Secondary | ICD-10-CM

## 2012-08-13 NOTE — Progress Notes (Signed)
Patient here follow up SRS left parietal brain 04/15/11 Alert oriented x3, no c/o pain, headaches, nausea, blurred vision on Trental, eationg well,  10:37 AM  MRI  08/10/12

## 2012-08-13 NOTE — Progress Notes (Signed)
Radiation Oncology         [id number]) [phone] ________________________________  Name: Kayla [name] MRN: [id number]  Date: 08/13/2012  DOB: Mar 14, 1951  Multidisciplinary Neuro Oncology Clinic Follow-Up Visit Note  CC: No primary provider on file.  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  The recent films were presented in our multidisciplinary conference with neuroradiology just prior to the clinic.  No complaints.                             ALLERGIES:  is allergic to sulfonamide derivatives.  Meds: Current Outpatient Prescriptions  Medication Sig Dispense Refill  . acetaminophen (TYLENOL) 500 MG tablet Take 1,000 mg by mouth every 6 (six) hours as needed.      . gabapentin (NEURONTIN) 300 MG capsule TAKE ONE CAPSULE BY MOUTH DAILY OR AS DIRECTED  90 capsule  2  . ibuprofen (ADVIL,MOTRIN) 200 MG tablet Take 200 mg by mouth every 8 (eight) hours as needed. For pain      . lisinopril (PRINIVIL,ZESTRIL) 40 MG tablet Take 1 tablet (40 mg total) by mouth daily.  30 tablet  3  . pentoxifylline (TRENTAL) 400 MG CR tablet Take 1 tablet (400 mg total) by mouth 2 (two) times daily.  60 tablet  5  . vitamin E 400 UNIT capsule Take 400 Units by mouth 2 (two) times daily.        Physical Findings: The patient is in no acute distress. Patient is alert and oriented.  vitals were not taken for this visit..  No significant changes.  Lab Findings: Lab Results  Component Value Date   WBC 16.2* 07/03/2012   HGB 15.0 07/03/2012   HCT 45.0 07/03/2012   MCV 83.6 07/03/2012   PLT 239 07/03/2012    @LASTCHEM @  Radiographic Findings: Mr Kayla [name] WJ Contrast  08/10/2012  *RADIOLOGY REPORT*  Clinical Data: 62 year old female status post stereotactic radiosurgery for restaging.  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  MRI HEAD WITHOUT AND WITH CONTRAST  Technique:  Multiplanar, multiecho pulse sequences of the brain and surrounding structures were obtained according to standard protocol without and with intravenous contrast  Contrast: 16mL MULTIHANCE GADOBENATE DIMEGLUMINE 529 MG/ML IV SOLN  Comparison: 05/04/2012 and earlier.  Findings: Stable T2 and FLAIR hyperintensity in a pattern compatible with vasogenic edema in the left superior parietal lobe since 01/27/2012.  No new or increased mass effect.  Stable architectural distortion at the surgery site.  Spiculated enhancement pattern following contrast in the area of architectural distortion  has mildly diminished from the preceding to exams. Abnormal diffusion at the site appears to be fading.  Stable mild dural thickening and enhancement underlying the craniotomy site.  No new or second enhancing lesion identified.  No restricted diffusion to suggest acute infarction. No ventriculomegaly.  No midline shift. No acute intracranial hemorrhage identified.  Stable gray and white matter signal elsewhere.  Half no new signal abnormality. Major intracranial vascular flow voids are stable.  Negative pituitary, cervicomedullary junction and visualized cervical spine.  Stable bone marrow signal. Visualized orbit soft tissues are within normal limits.  Visualized paranasal sinuses and  mastoids are clear. Stable scalp soft tissues.  IMPRESSION: 1.  Stable to mildly regressed findings at the left parietal surgical site since 01/27/2012 following resection of radionecrosis. 2.  No progression or new brain metastasis identified.   Original Report Authenticated By: Erskine Speed, M.D.    Mm Digital Screening  08/07/2012  *RADIOLOGY REPORT*  Clinical Data: Screening.  DIGITAL BILATERAL SCREENING MAMMOGRAM WITH CAD  Comparison:  Previous  exams.  FINDINGS:  ACR Breast Density Category 2: There is a scattered fibroglandular pattern.  No suspicious masses, architectural distortion, or calcifications are present.  Images were processed with CAD.  IMPRESSION: No mammographic evidence of malignancy.  A result letter of this screening mammogram will be mailed directly to the patient.  RECOMMENDATION: Screening mammogram in one year. (Code:SM-B-01Y)  BI-RADS CATEGORY 1:  Negative.   Original Report Authenticated By: Hulan Saas, M.D.     Impression:  The patient is recovering from the effects of radiation.  She has no evidence of recurrence at this time.  Plan:  MRI and follow-up in 3 months.  _____________________________________  Artist Pais. Kathrynn Running, M.D.

## 2012-08-14 ENCOUNTER — Other Ambulatory Visit: Payer: Self-pay | Admitting: Radiation Therapy

## 2012-08-14 DIAGNOSIS — C7931 Secondary malignant neoplasm of brain: Secondary | ICD-10-CM

## 2012-08-20 ENCOUNTER — Other Ambulatory Visit (HOSPITAL_COMMUNITY): Payer: Self-pay | Admitting: Oncology

## 2012-08-20 ENCOUNTER — Other Ambulatory Visit: Payer: Self-pay | Admitting: *Deleted

## 2012-08-20 DIAGNOSIS — C189 Malignant neoplasm of colon, unspecified: Secondary | ICD-10-CM

## 2012-08-20 DIAGNOSIS — C801 Malignant (primary) neoplasm, unspecified: Secondary | ICD-10-CM

## 2012-08-20 DIAGNOSIS — C719 Malignant neoplasm of brain, unspecified: Secondary | ICD-10-CM

## 2012-08-20 DIAGNOSIS — C7931 Secondary malignant neoplasm of brain: Secondary | ICD-10-CM

## 2012-08-20 MED ORDER — GABAPENTIN 300 MG PO CAPS: 300.0000 mg | ORAL_CAPSULE | Freq: Every day | ORAL | Status: DC

## 2012-08-28 ENCOUNTER — Ambulatory Visit (HOSPITAL_BASED_OUTPATIENT_CLINIC_OR_DEPARTMENT_OTHER): Payer: Medicare Other

## 2012-08-28 VITALS — BP 130/85 | HR 99 | Temp 97.6°F | Resp 17

## 2012-08-28 DIAGNOSIS — C719 Malignant neoplasm of brain, unspecified: Secondary | ICD-10-CM

## 2012-08-28 DIAGNOSIS — Z452 Encounter for adjustment and management of vascular access device: Secondary | ICD-10-CM

## 2012-08-28 DIAGNOSIS — C186 Malignant neoplasm of descending colon: Secondary | ICD-10-CM

## 2012-08-28 MED ORDER — SODIUM CHLORIDE 0.9 % IJ SOLN
10.0000 mL | INTRAMUSCULAR | Status: DC | PRN
  Administered 2012-08-28: 10 mL via INTRAVENOUS
  Filled 2012-08-28: qty 10

## 2012-08-28 MED ORDER — HEPARIN SOD (PORK) LOCK FLUSH 100 UNIT/ML IV SOLN
500.0000 [IU] | Freq: Once | INTRAVENOUS | Status: AC
  Administered 2012-08-28: 500 [IU] via INTRAVENOUS
  Filled 2012-08-28: qty 5

## 2012-10-16 ENCOUNTER — Encounter (HOSPITAL_COMMUNITY)
Admission: RE | Admit: 2012-10-16 | Discharge: 2012-10-16 | Disposition: A | Discharge status: Home / Self Care | Payer: Medicare Other | Source: Ambulatory Visit | Attending: Family | Admitting: Family

## 2012-10-16 ENCOUNTER — Other Ambulatory Visit: Payer: Medicare Other

## 2012-10-16 DIAGNOSIS — C189 Malignant neoplasm of colon, unspecified: Secondary | ICD-10-CM | POA: Insufficient documentation

## 2012-10-16 DIAGNOSIS — C7931 Secondary malignant neoplasm of brain: Secondary | ICD-10-CM

## 2012-10-16 LAB — GLUCOSE, CAPILLARY: Glucose-Capillary: 92 mg/dL (ref 70–99)

## 2012-10-16 MED ORDER — FLUDEOXYGLUCOSE F - 18 (FDG) INJECTION
18.0000 | Freq: Once | INTRAVENOUS | Status: AC | PRN
  Administered 2012-10-16: 18 via INTRAVENOUS

## 2012-10-16 NOTE — Progress Notes (Signed)
Quick Note:  Please notify patient and call/fax these results to patient's doctors. ______ 

## 2012-10-17 ENCOUNTER — Telehealth: Payer: Self-pay | Admitting: Medical Oncology

## 2012-10-17 NOTE — Telephone Encounter (Signed)
I called pt with CT results from 10/16/12.

## 2012-10-23 ENCOUNTER — Encounter: Payer: Self-pay | Admitting: Oncology

## 2012-10-23 ENCOUNTER — Telehealth: Payer: Self-pay | Admitting: Oncology

## 2012-10-23 ENCOUNTER — Other Ambulatory Visit (HOSPITAL_BASED_OUTPATIENT_CLINIC_OR_DEPARTMENT_OTHER): Payer: Medicare Other | Admitting: Lab

## 2012-10-23 ENCOUNTER — Ambulatory Visit (HOSPITAL_BASED_OUTPATIENT_CLINIC_OR_DEPARTMENT_OTHER): Payer: Medicare Other | Admitting: Oncology

## 2012-10-23 VITALS — BP 146/94 | HR 88 | Temp 98.5°F | Resp 18 | Ht 63.0 in | Wt 182.0 lb

## 2012-10-23 DIAGNOSIS — C189 Malignant neoplasm of colon, unspecified: Secondary | ICD-10-CM

## 2012-10-23 DIAGNOSIS — C7949 Secondary malignant neoplasm of other parts of nervous system: Secondary | ICD-10-CM

## 2012-10-23 DIAGNOSIS — C7931 Secondary malignant neoplasm of brain: Secondary | ICD-10-CM

## 2012-10-23 DIAGNOSIS — Z85038 Personal history of other malignant neoplasm of large intestine: Secondary | ICD-10-CM

## 2012-10-23 DIAGNOSIS — C186 Malignant neoplasm of descending colon: Secondary | ICD-10-CM

## 2012-10-23 DIAGNOSIS — D696 Thrombocytopenia, unspecified: Secondary | ICD-10-CM

## 2012-10-23 LAB — CBC WITH DIFFERENTIAL/PLATELET
BASO%: 0.3 % (ref 0.0–2.0)
Basophils Absolute: 0 10*3/uL (ref 0.0–0.1)
EOS%: 2.4 % (ref 0.0–7.0)
HCT: 42.2 % (ref 34.8–46.6)
HGB: 13.6 g/dL (ref 11.6–15.9)
LYMPH%: 27 % (ref 14.0–49.7)
MCH: 27.3 pg (ref 25.1–34.0)
MCHC: 32.2 g/dL (ref 31.5–36.0)
MCV: 84.6 fL (ref 79.5–101.0)
MONO%: 7.3 % (ref 0.0–14.0)
NEUT%: 63 % (ref 38.4–76.8)
lymph#: 2.1 10*3/uL (ref 0.9–3.3)

## 2012-10-23 LAB — COMPREHENSIVE METABOLIC PANEL (CC13)
Alkaline Phosphatase: 123 U/L (ref 40–150)
BUN: 14 mg/dL (ref 7.0–26.0)
CO2: 25 mEq/L (ref 22–29)
Creatinine: 0.9 mg/dL (ref 0.6–1.1)
Glucose: 121 mg/dl — ABNORMAL HIGH (ref 70–99)
Total Bilirubin: 0.48 mg/dL (ref 0.20–1.20)
Total Protein: 6.8 g/dL (ref 6.4–8.3)

## 2012-10-23 MED ORDER — SODIUM CHLORIDE 0.9 % IJ SOLN
10.0000 mL | INTRAMUSCULAR | Status: DC | PRN
  Administered 2012-10-23: 10 mL via INTRAVENOUS
  Filled 2012-10-23: qty 10

## 2012-10-23 MED ORDER — HEPARIN SOD (PORK) LOCK FLUSH 100 UNIT/ML IV SOLN
500.0000 [IU] | Freq: Once | INTRAVENOUS | Status: AC
  Administered 2012-10-23: 500 [IU] via INTRAVENOUS
  Filled 2012-10-23: qty 5

## 2012-10-23 NOTE — Progress Notes (Signed)
This office note has been dictated.  #[id number]

## 2012-10-24 NOTE — Progress Notes (Signed)
CC:   [name], M.D. [name], M.D. [name], MD, [name]. [name], M.D.  PROBLEM LIST:  1. Diagnosis of colon cancer dates back to 10/27/2008 when Kayla [name]  presented with what turned out to be a bowel perforation through  tumor. Stage at that time was T4 N1 M1 with pulmonary metastatic  disease. The K-ras mutation was detected. Kayla [name] underwent  surgical resection of her tumor with a colostomy at the time of her  surgery on 10/27/2008. The colostomy has been subsequently  reversed on 09/20/2010. Kayla [name] received chemotherapy consisting  of FOLFOX for 10 treatments from 12/10/2008 through 06/02/2009.  She achieved a partial remission from these treatments. She then  received additional chemotherapy with 5-FU, leucovorin, and 5-FU by  continuous infusion along with Avastin from 06/23/2009 through  11/17/2009. Kayla [name] had some peripheral neuropathy in her feet  from the oxaliplatin. She was doing well without evidence of  disease until she developed a right hemiparesthesia in September 2012  and was found to have a 3 x 3 x 2.4 cm, lobulated, enhancing mass  in the left parietal lobe with marked surrounding edema. A PET  scan on 03/25/2011 showed resolution of the previously identified  pulmonary nodules with no residual hypermetabolic activity. The  pericardial effusion, which has been present since diagnosis, was  unchanged. There were also uterine fibroids and a low-density  lesion in the anterior spleen that was stable and not associated  with any hypermetabolic activity. Of note, Kayla [name] had a markedly  elevated CEA up to 24.7 on 03/23/2011. On 06/23/2011, the CEA was  less than 0.5. Dr. [name] resected the recurrent metastatic colon cancer on 03/31/2011. This was followed by stereotactic radiation on 04/15/2011.  The patient underwent a left-sided craniotomy and excision  of the mass involving her left parietal lobe on 11/10/2011  by Dr. [name]. The pathology report was negative for  any malignancy. Pathology report indicated benign brain with fibrosis,  hemorrhage, hemosiderin deposition, abundant dystrophic calcifications,  and mixed acute and chronic inflammation including foreign body  multinucleated giant cells.  2. Abnormal MRI of the head done on 01/27/2012 associated with right  hemiparesis, most consistent with radiation necrosis. The patient underwent left-sided craniotomy and excision of the left parietal lobe mass on  11/10/2011. The pathology report showed no evidence of malignancy.  3. History of thrombocytopenia first noted in October 2012  and again in January 2013 when the platelet count was 64,000, felt to be  due most likely to Keppra.  4. Hypertension.  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  7. Right-sided Port-A-Cath was placed on 12/10/2008.    MEDICATIONS:  Reviewed and recorded. Current Outpatient Prescriptions  Medication Sig Dispense Refill  . acetaminophen (TYLENOL) 500 MG tablet Take 1,000 mg by mouth every 6 (six) hours as needed.      . gabapentin (NEURONTIN) 300 MG capsule Take 1 capsule (300 mg total) by mouth daily.  90 capsule  0  . ibuprofen (ADVIL,MOTRIN) 200 MG tablet Take 200 mg by mouth every 8 (eight) hours as needed. For pain      . lisinopril (PRINIVIL,ZESTRIL) 40 MG tablet Take 1 tablet (40 mg total) by mouth daily.  30 tablet  3  . pentoxifylline (TRENTAL) 400 MG CR tablet Take 1 tablet (400 mg total) by mouth 2 (two) times daily.  60 tablet  5  . vitamin E 400 UNIT capsule  Take 400 Units by mouth 2 (two) times daily.       No current facility-administered medications for this visit.    SMOKING HISTORY:  The patient has smoked intermittently, about 2 packs of cigarettes per week, since she was a teenager, but stopped smoking in 2010.  HISTORY:  Kayla [name] was seen today for followup of her  metastatic colon cancer with recurrence in the left parietal region of the brain first detected on 03/23/2011 when Kayla [name] presented with weakness in her right upper extremity and had an elevated CEA of 24.7.  As noted above, Kayla [name] has undergone 2 resections of this area by Dr. [name]. She also underwent stereotactic radiation.  Kayla [name] was last seen by [place] on 07/03/2012 and 05/03/2012.  Her condition remains stable and without evidence for recurrence of disease.  Kayla [name] is here today with her sister-in-law, Kayla [name].  Kayla [name]'s main complaints are paresthesias in her feet related to the oxaliplatin.  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  She has not been falling.  She denies any headaches, any obvious weakness or residual effects from her brain tumor and its surgery.  She is not driving.  She is really without complaints today, feels generally well.  She has undergone an MRI of the head most recently on 08/10/2012 and a PET scan on 10/16/2012, both of which failed to show any significant abnormalities or evidence of recurrent tumor.  PHYSICAL EXAM:  General:  Kayla [name] looks well.  She has gained a lot a weight.  Her weight today is 182 pounds, height 5 feet 3 inches, body surface area 1.92 sq m.  Vital Signs:  Blood pressure 146/94.  Other vital signs are normal.  Back in January 2011 Kayla [name] weighed 140 pounds and back in August 2010 Kayla [name] weighed 123 pounds.  I believe her baseline weight prior to illness was about 130 pounds.  I believe a lot of that weight gain came when she was on Decadron.  HEENT:  There is no scleral icterus.  There is conjunctival injection.  Pupillary and extraocular movements are normal.  Mouth and pharynx are benign.  No cranial nerve deficits.  Heart and lungs:  Normal.  Breasts:  Not examined.  Abdomen:  Benign with no organomegaly or masses palpable. Extremities:  No peripheral edema or clubbing.  There is a right-sided Port-A-Cath that was  flushed with heparin today.  We have been flushing it with heparin every 2 months.  Neurologic:  Shows maybe a hint of some right proximal leg weakness, otherwise normal.  Kayla [name] was able to do a partial deep knee bend and her heel-to-toe on a straight line was fairly good.  As stated, she is not falling.  LABORATORY DATA:  White count 7.6, ANC 4.8, hemoglobin 13.6, hematocrit 42.2, platelets 196,000.  Chemistries today notable for an albumin of 3.2 and a glucose of 121.  BUN 14, creatinine 0.9, LDH 148.  CEA today is pending.  CEA on 07/03/2012 was 1.1.  IMAGING STUDIES:  1. MRI of the head with and without IV contrast on 03/23/2011 showed a  solitary enhancing mass in the left parietal lobe with surrounding  white matter edema, most compatible with solitary metastatic  deposit. The mass lesion measured 2.7 x 2.5 cm.  2. PET scan from 03/25/2011 showed resolution of the previously-  identified pulmonary nodules with no residual hypermetabolic  activity noted in the neck, chest, abdomen or pelvis to suggest  active malignancy. There was a pericardial effusion and some  subsegmental atelectasis in the lower lobes. There were also  uterine fibroids and a low-density lesion in the anterior spleen  that was stable and not associated with hypermetabolic activity.  3. MRI of the head with and without IV contrast showed Stealth  protocol utilized to evaluate left parietal mass. The mass  measured 3 x 3 x 2.4 cm with marked surrounding vasogenic edema.  4. MRI of the head with and without IV contrast on 04/08/2011 showed  postsurgical changes of tumor removal in the left parietal lobe and  a postsurgical hematoma with enhancement. There was 3 mm of  midline shift. No other metastatic deposits.  5. MRI of the head with and without IV contrast on 06/28/2011 showed a  3 cm area of restricted diffusion lying within the previous area of  left parietal surgical resection for colon cancer. There  was  enhancement of the peripheral aspect of the cavity, marked  restricted diffusion and increasing vasogenic edema. There was  concern about the development of a brain abscess.  6. MRI of the head with and without IV contrast on 07/29/2011 showed  left parietal postoperative hematoma was smaller compared with the  prior study. There was no nodular enhancement seen to indicate  tumor. No new enhancing lesions were seen. There was significant  improvement in the left parietal edema.  7. Digital screening mammogram was negative on 08/02/2011.  8. An MRI of the head with and without IV contrast on 10/21/2011  showed development of gyriform enhancement circumferentially along  the margins of the left parietal postoperative space/hematoma.  Marked increase in vasogenic edema was seen. The pattern was felt  to be most consistent with radiation necrosis rather than recurrent  tumor. There was further contraction of the hematoma/postoperative  space in the left parietal cortical region, now measuring 17 x 19  mm in transverse diameter as opposed to 22 x 21 mm previously.  Left-to-right shift was 2 mm.  9. Nuclear medicine brain PET-CT scan carried out on 10/27/2011 showed  hypermetabolic activity in the high left parietal lobe which  corresponds to gyriform enhancement on comparison MRI from  10/21/2011. This was felt to be most consistent with recurrent  tumor.  10. Chest x-ray, 2 view from 11/04/2011, showed chronic cardiomegaly.  Port-A-Cath was in place. No acute disease was present.  11. MRI of the head with and without IV contrast on 01/27/2012 showed  findings that were felt to be consistent with progression of disease.  This exam was compared with the MRI of 10/21/2011. It was recognized  that the patient underwent surgical resection of the mass on 11/10/2011.  There was felt to be on the present exam residual peripheral enhancement  of approximately 35 x 40 x 21 mm with infiltration  of the cortex and  deep white matter. It was felt that the enhancement had progressed,  despite removal of the central focus. There was moderate vasogenic  edema throughout the left hemisphere but slightly decreased from the MRI  from 10/21/2011. No new lesions were seen. There was mild atrophy with  chronic microvascular ischemic change, but no midline shift. Major  intracranial vascular structures were patent.  12. MRI of the brain with and without IV contrast on 05/04/2012 showed previous left parietal lobe surgery for resection of tumor and radiation necrosis.  No new abnormalities were noted. 13. Chest x-ray, 2 view, on 07/03/2012 showed enlargement of the cardiac silhouette with some lingular scarring, as compared with the chest x-ray from 11/04/2011.  14. 2-D echocardiogram from 07/12/2012 showed left ventricular ejection fraction of 55-60%.  There was a small to moderate circumferential pericardial effusion with no signs of tamponade.  There was felt to be no significant change from the 2-D echocardiogram dated 08/13/2009. 15. Digital bilateral screening mammogram on 08/06/2012 was negative. 16. 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  There was no progression or new brain metastasis identified. 17. PET scan from 10/16/2012 showed no specific features to suggest metastatic disease.  There was diffuse uptake throughout the thyroid gland, possibly due to hyperthyroidism.  IMPRESSION AND PLAN:  Kayla [name] continues to do well with no evidence for disease recurrence.  She is now out 4 years from the time of original diagnosis and a year and a half from the time of diagnosis of her right brain recurrence.  Clinically, Katerra is doing well.  She is no longer on Decadron.  Will continue to flush the Port-A-Cath every 2 months with  heparin. Kayla [name] will return to see me again in 4 months, at which time we will check CBC, chemistries, LDH, and CEA.  I believe she is having repeat MRIs of the brain ordered by Dr. Kathrynn Running about every 3-4 months.    ______________________________ Samul Dada, M.D. DSM/MEDQ  D:  10/23/2012  T:  10/24/2012  Job:  [id number]

## 2012-11-16 ENCOUNTER — Ambulatory Visit
Admission: RE | Admit: 2012-11-16 | Discharge: 2012-11-16 | Disposition: A | Discharge status: Home / Self Care | Payer: Medicare Other | Source: Ambulatory Visit | Attending: Radiation Oncology | Admitting: Radiation Oncology

## 2012-11-16 DIAGNOSIS — C7931 Secondary malignant neoplasm of brain: Secondary | ICD-10-CM

## 2012-11-16 MED ORDER — GADOBENATE DIMEGLUMINE 529 MG/ML IV SOLN
17.0000 mL | Freq: Once | INTRAVENOUS | Status: AC | PRN
  Administered 2012-11-16: 17 mL via INTRAVENOUS

## 2012-11-18 ENCOUNTER — Encounter: Payer: Self-pay | Admitting: Radiation Oncology

## 2012-11-18 ENCOUNTER — Other Ambulatory Visit: Payer: Self-pay | Admitting: Oncology

## 2012-11-18 NOTE — Progress Notes (Signed)
Radiation Oncology         [phone]) [phone] ________________________________  Name: Kayla [name] MRN: [id number]  Date: 11/19/2012  DOB: 04/25/1951  Multidisciplinary Neuro Oncology Clinic Follow-Up Visit Note  CC: No primary provider on file.  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 [name] from neurosurgery.  The recent films were presented in our multidisciplinary conf erence with neuroradiology just prior to the clinic.   she is without complaint she denies headaches nausea vomiting                         ALLERGIES:  is allergic to sulfonamide derivatives.  Meds: Current Outpatient Prescriptions  Medication Sig Dispense Refill  . acetaminophen (TYLENOL) 500 MG tablet Take 1,000 mg by mouth every 6 (six) hours as needed.      [name] ibuprofen (ADVIL,MOTRIN) 200 MG tablet Take 200 mg by mouth every 8 (eight) hours as needed. For pain      . pentoxifylline (TRENTAL) 400 MG CR tablet Take 1 tablet (400 mg total) by mouth 2 (two) times daily.  60 tablet  5  . vitamin E 400 UNIT capsule Take 400 Units by mouth 2 (two) times daily.      [name] gabapentin (NEURONTIN) 300 MG capsule TAKE ONE CAPSULE BY MOUTH EVERY DAY  90 capsule  1  . lisinopril (PRINIVIL,ZESTRIL) 40 MG tablet TAKE ONE TABLET BY MOUTH EVERY DAY  30 tablet  1   No current facility-administered medications for this encounter.    Physical Findings: The patient is in no acute distress. Patient is alert and oriented.  weight is 182 lb 1.6 oz (82.6 kg). Her oral temperature is 97.3 F (36.3 C). Her blood pressure is 138/87 and her pulse is 84. Her respiration is 18 and oxygen saturation is 100%. .  No significant changes.  Lab Findings: Lab Results  Component Value Date   WBC 7.6 10/23/2012   HGB  13.6 10/23/2012   HCT 42.2 10/23/2012   MCV 84.6 10/23/2012   PLT 196 10/23/2012    @LASTCHEM @  Radiographic Findings: Mr [name] NF Contrast  11/16/2012  *RADIOLOGY REPORT*  Clinical Data: Metastatic cancer.  Postop craniotomy and radiosurgery.  Repeat resection revealed radiation necrosis.  MRI HEAD WITHOUT AND WITH CONTRAST  Technique:  Multiplanar, multiecho pulse sequences of the brain and surrounding structures were obtained according to standard protocol without and with intravenous contrast  Contrast: 17mL MULTIHANCE GADOBENATE DIMEGLUMINE 529 MG/ML IV SOLN  Comparison: MRI 08/10/2012  Findings: Postop left parietal craniotomy for tumor resection. Surgical cavity with surrounding chronic hemorrhage is similar to the prior study.  Hyperintensity in the surrounding white matter is stable.  Postcontrast images reveal irregular stellate shaped enhancement in the surgical bed, similar in size and appearance to the prior study.  No new areas of enhancement are identified.  Scattered white matter hyperintensities bilaterally do not enhance and are stable.  These may be due to chronic microvascular ischemia and are unchanged from prior studies.  No acute infarct.  No midline shift.  IMPRESSION: Stable MRI.   Postoperative enhancement left parietal lobe is stable and may represent radiation change.  No new areas of tumor involvement.   Original Report Authenticated By:  Janeece Riggers, M.D.     Impression:  The patient is recovering from the effects of radiation.   her most recent MRI shows no evidence of disease recurrence.  Plan:  Short-term for followup MRI in 3 months and a visit in the clinic.   _____________________________________  Artist Pais. Kathrynn Running, M.D.

## 2012-11-19 ENCOUNTER — Encounter: Payer: Self-pay | Admitting: Radiation Oncology

## 2012-11-19 ENCOUNTER — Telehealth: Payer: Self-pay | Admitting: Oncology

## 2012-11-19 ENCOUNTER — Ambulatory Visit
Admission: RE | Admit: 2012-11-19 | Discharge: 2012-11-19 | Disposition: A | Discharge status: Home / Self Care | Payer: Medicare Other | Source: Ambulatory Visit | Attending: Radiation Oncology | Admitting: Radiation Oncology

## 2012-11-19 VITALS — BP 138/87 | HR 84 | Temp 97.3°F | Resp 18 | Wt 182.1 lb

## 2012-11-19 DIAGNOSIS — C719 Malignant neoplasm of brain, unspecified: Secondary | ICD-10-CM

## 2012-11-19 NOTE — Progress Notes (Signed)
Patient presents with friend for follow up with Dr. Kathrynn Running and MRI results. Patient alert and oriented to person, place, and time. No distress noted. Steady gait noted. Pleasant affect noted. Patient denies pain at this time. Patient denies nausea, vomiting, headache, or dizziness. Patient reports neuropathy continues in her feet. Patient reports that she continues to take trental and vitamin e but, need refill. Patient has not complaints at this time stating she "feels great." Reported all findings to Dr. Kathrynn Running.

## 2012-11-19 NOTE — Assessment & Plan Note (Signed)
No evidence of recurrence on most recent brain MRI

## 2012-11-19 NOTE — Telephone Encounter (Signed)
pt came by to r/s appt.Marland KitchenMarland KitchenMarland KitchenDone

## 2012-12-11 ENCOUNTER — Other Ambulatory Visit: Payer: Self-pay | Admitting: Radiation Therapy

## 2012-12-11 DIAGNOSIS — C7949 Secondary malignant neoplasm of other parts of nervous system: Secondary | ICD-10-CM

## 2012-12-11 DIAGNOSIS — C7931 Secondary malignant neoplasm of brain: Secondary | ICD-10-CM

## 2012-12-24 ENCOUNTER — Telehealth: Payer: Self-pay | Admitting: *Deleted

## 2012-12-24 NOTE — Telephone Encounter (Signed)
sw pt informed her that 03/18/13 is an holiday. gv appt d/t for 03/25/13 with the starting time of her appt@11 :30am...td

## 2012-12-25 ENCOUNTER — Ambulatory Visit (HOSPITAL_BASED_OUTPATIENT_CLINIC_OR_DEPARTMENT_OTHER): Payer: Medicare Other

## 2012-12-25 VITALS — BP 140/77 | HR 84 | Temp 97.6°F

## 2012-12-25 DIAGNOSIS — C189 Malignant neoplasm of colon, unspecified: Secondary | ICD-10-CM

## 2012-12-25 MED ORDER — SODIUM CHLORIDE 0.9 % IJ SOLN
10.0000 mL | INTRAMUSCULAR | Status: DC | PRN
  Administered 2012-12-25: 10 mL via INTRAVENOUS
  Filled 2012-12-25: qty 10

## 2012-12-25 MED ORDER — HEPARIN SOD (PORK) LOCK FLUSH 100 UNIT/ML IV SOLN
500.0000 [IU] | Freq: Once | INTRAVENOUS | Status: AC
  Administered 2012-12-25: 500 [IU] via INTRAVENOUS
  Filled 2012-12-25: qty 5

## 2012-12-25 NOTE — Patient Instructions (Signed)
Implanted Port Instructions  An implanted port is a central line that has a round shape and is placed under the skin. It is used for long-term IV (intravenous) access for:  · Medicine.  · Fluids.  · Liquid nutrition, such as TPN (total parenteral nutrition).  · Blood samples.  Ports can be placed:  · In the chest area just below the collarbone (this is the most common place.)  · In the arms.  · In the belly (abdomen) area.  · In the legs.  PARTS OF THE PORT  A port has 2 main parts:  · The reservoir. The reservoir is round, disc-shaped, and will be a small, raised area under your skin.  · The reservoir is the part where a needle is inserted (accessed) to either give medicines or to draw blood.  · The catheter. The catheter is a long, slender tube that extends from the reservoir. The catheter is placed into a large vein.  · Medicine that is inserted into the reservoir goes into the catheter and then into the vein.  INSERTION OF THE PORT  · The port is surgically placed in either an operating room or in a procedural area (interventional radiology).  · Medicine may be given to help you relax during the procedure.  · The skin where the port will be inserted is numbed (local anesthetic).  · 1 or 2 small cuts (incisions) will be made in the skin to insert the port.  · The port can be used after it has been inserted.  INCISION SITE CARE  · The incision site may have small adhesive strips on it. This helps keep the incision site closed. Sometimes, no adhesive strips are placed. Instead of adhesive strips, a special kind of surgical glue is used to keep the incision closed.  · If adhesive strips were placed on the incision sites, do not take them off. They will fall off on their own.  · The incision site may be sore for 1 to 2 days. Pain medicine can help.  · Do not get the incision site wet. Bathe or shower as directed by your caregiver.  · The incision site should heal in 5 to 7 days. A small scar may form after the  incision has healed.  ACCESSING THE PORT  Special steps must be taken to access the port:  · Before the port is accessed, a numbing cream can be placed on the skin. This helps numb the skin over the port site.  · A sterile technique is used to access the port.  · The port is accessed with a needle. Only "non-coring" port needles should be used to access the port. Once the port is accessed, a blood return should be checked. This helps ensure the port is in the vein and is not clogged (clotted).  · If your caregiver believes your port should remain accessed, a clear (transparent) bandage will be placed over the needle site. The bandage and needle will need to be changed every week or as directed by your caregiver.  · Keep the bandage covering the needle clean and dry. Do not get it wet. Follow your caregiver's instructions on how to take a shower or bath when the port is accessed.  · If your port does not need to stay accessed, no bandage is needed over the port.  FLUSHING THE PORT  Flushing the port keeps it from getting clogged. How often the port is flushed depends on:  · If a   constant infusion is running. If a constant infusion is running, the port may not need to be flushed.  · If intermittent medicines are given.  · If the port is not being used.  For intermittent medicines:  · The port will need to be flushed:  · After medicines have been given.  · After blood has been drawn.  · As part of routine maintenance.  · A port is normally flushed with:  · Normal saline.  · Heparin.  · Follow your caregiver's advice on how often, how much, and the type of flush to use on your port.  IMPORTANT PORT INFORMATION  · Tell your caregiver if you are allergic to heparin.  · After your port is placed, you will get a manufacturer's information card. The card has information about your port. Keep this card with you at all times.  · There are many types of ports available. Know what kind of port you have.  · In case of an  emergency, it may be helpful to wear a medical alert bracelet. This can help alert health care workers that you have a port.  · The port can stay in for as long as your caregiver believes it is necessary.  · When it is time for the port to come out, surgery will be done to remove it. The surgery will be similar to how the port was put in.  · If you are in the hospital or clinic:  · Your port will be taken care of and flushed by a nurse.  · If you are at home:  · A home health care nurse may give medicines and take care of the port.  · You or a family member can get special training and directions for giving medicine and taking care of the port at home.  SEEK IMMEDIATE MEDICAL CARE IF:   · Your port does not flush or you are unable to get a blood return.  · New drainage or pus is coming from the incision.  · A bad smell is coming from the incision site.  · You develop swelling or increased redness at the incision site.  · You develop increased swelling or pain at the port site.  · You develop swelling or pain in the surrounding skin near the port.  · You have an oral temperature above 102° F (38.9° C), not controlled by medicine.  MAKE SURE YOU:   · Understand these instructions.  · Will watch your condition.  · Will get help right away if you are not doing well or get worse.  Document Released: 07/04/2005 Document Revised: 09/26/2011 Document Reviewed: 09/25/2008  [employer]® Patient Information ©2014 [employer].

## 2013-01-15 ENCOUNTER — Other Ambulatory Visit: Payer: Self-pay | Admitting: Oncology

## 2013-02-26 ENCOUNTER — Ambulatory Visit: Payer: Medicare Other | Admitting: Oncology

## 2013-02-26 ENCOUNTER — Other Ambulatory Visit: Payer: Medicare Other | Admitting: Lab

## 2013-03-11 ENCOUNTER — Other Ambulatory Visit: Payer: Self-pay | Admitting: Radiation Therapy

## 2013-03-11 DIAGNOSIS — C7931 Secondary malignant neoplasm of brain: Secondary | ICD-10-CM

## 2013-03-12 ENCOUNTER — Encounter: Payer: Self-pay | Admitting: Radiation Oncology

## 2013-03-12 ENCOUNTER — Ambulatory Visit
Admission: RE | Admit: 2013-03-12 | Discharge: 2013-03-12 | Disposition: A | Discharge status: Home / Self Care | Payer: Medicare Other | Source: Ambulatory Visit | Attending: Radiation Oncology | Admitting: Radiation Oncology

## 2013-03-12 DIAGNOSIS — C189 Malignant neoplasm of colon, unspecified: Secondary | ICD-10-CM | POA: Insufficient documentation

## 2013-03-12 DIAGNOSIS — C7931 Secondary malignant neoplasm of brain: Secondary | ICD-10-CM

## 2013-03-12 LAB — BUN AND CREATININE (CC13)
BUN: 12.4 mg/dL (ref 7.0–26.0)
Creatinine: 0.8 mg/dL (ref 0.6–1.1)

## 2013-03-12 MED ORDER — GADOBENATE DIMEGLUMINE 529 MG/ML IV SOLN
117.0000 mL | Freq: Once | INTRAVENOUS | Status: AC | PRN
  Administered 2013-03-12: 117 mL via INTRAVENOUS

## 2013-03-12 NOTE — Progress Notes (Signed)
Radiation Oncology         [phone] ________________________________  Name: Kayla [name] MRN: [id number]  Date: 03/13/2013  DOB: 12/04/1950  Multidisciplinary Neuro Oncology Clinic Follow-Up Visit Note  CC: No primary provider on file.  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 Stern from neurosurgery.  The recent films were presented in our multidisciplinary conference with neuroradiology just prior to the clinic.  She notes right leg numbness.  Otherwise asymptomatic.                              ALLERGIES:  is allergic to sulfonamide derivatives.  Meds: Current Outpatient Prescriptions  Medication Sig Dispense Refill  . acetaminophen (TYLENOL) 500 MG tablet Take 1,000 mg by mouth every 6 (six) hours as needed.      . gabapentin (NEURONTIN) 300 MG capsule TAKE ONE CAPSULE BY MOUTH EVERY DAY  90 capsule  1  . ibuprofen (ADVIL,MOTRIN) 200 MG tablet Take 200 mg by mouth every 8 (eight) hours as needed. For pain      . lisinopril (PRINIVIL,ZESTRIL) 40 MG tablet TAKE ONE TABLET BY MOUTH EVERY DAY  30 tablet  1  . pentoxifylline (TRENTAL) 400 MG CR tablet Take 1 tablet (400 mg total) by mouth 2 (two) times daily.  60 tablet  5  . vitamin E 400 UNIT capsule Take 400 Units by mouth 2 (two) times daily.       No current facility-administered medications for this encounter.    Physical Findings: The patient is in no acute distress. Patient is alert and oriented. Neuro exam intact (lower extremities MS, DTR, proprioception, and light touch). Speech fluent, gait normal. No significant changes.  Lab Findings: Lab Results  Component Value Date   WBC 7.6 10/23/2012   HGB 13.6 10/23/2012   HCT 42.2 10/23/2012   MCV 84.6 10/23/2012   PLT 196 10/23/2012     @LASTCHEM@  Radiographic Findings: Mr Brain W Wo Contrast  03/12/2013   *RADIOLOGY REPORT*  Clinical Data: Follow-up left parietal colon cancer metastasis with S R S.  MRI HEAD WITHOUT AND WITH CONTRAST  Technique:  Multiplanar, multiecho pulse sequences of the brain and surrounding structures were obtained according to standard protocol without and with intravenous contrast  Contrast:  17mL MULTIHANCE GADOBENATE DIMEGLUMINE 529 MG/ML IV SOLN at  Comparison: Multiple priors, most recent 11/16/2012.  Findings: The patient underwent resection of a left occipital metastasis September 2012.  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  T2 confluent bright signal and encephalomalacia accompany the area of resection cavity.  Slight increased prominence of restricted diffusion in the adjacent gyri have waxed and waned on previous studies from January 2014 and July 2013, and may be slightly increased today, but there is no dominant area of restricted diffusion to strongly suggest additional radionecrosis. Moderate gyriform as well as nodular enhancement in the left parietal lobe appears fairly stable compared with most recent study from Nov 16 2012. One could argue that the intensity of the enhancement may be diminished slightly. No significant mass effect.  Mild atrophy.  Moderate white matter changes.  No additional lesions are evident. There is no   midline shift.  IMPRESSION: Residual gyriform enhancement surrounding the area of previous surgical resection of a left parietal metastasis with subsequent resection of radionecrosis.  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   Original Report Authenticated By: John Curnes, M.D.    Impression:  The patient is stable with NED  Plan:  MRI in 3 months.  _____________________________________  Milam Allbaugh A. Daunte Oestreich,  M.D. and  Joseph Stern, M.D.   

## 2013-03-13 ENCOUNTER — Ambulatory Visit
Admission: RE | Admit: 2013-03-13 | Discharge: 2013-03-13 | Disposition: A | Discharge status: Home / Self Care | Payer: Medicare Other | Source: Ambulatory Visit | Attending: Radiation Oncology | Admitting: Radiation Oncology

## 2013-03-13 DIAGNOSIS — C7931 Secondary malignant neoplasm of brain: Secondary | ICD-10-CM

## 2013-03-13 NOTE — Addendum Note (Signed)
Encounter addended by: [name], MD on: 03/13/2013  9:20 AM<BR>     Documentation filed: Notes Section

## 2013-03-13 NOTE — Progress Notes (Signed)
Radiation Oncology         [phone]) [phone] ________________________________  Name: Kayla [name] MRN: [id number]  Date: 03/13/2013  DOB: 1951-07-01  Multidisciplinary Neuro Oncology Clinic Follow-Up Visit Note  CC: No primary provider on file.  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 [name] from neurosurgery.  The recent films were presented in our multidisciplinary conference with neuroradiology just prior to the clinic.  She notes right leg numbness.  Otherwise asymptomatic.                              ALLERGIES:  is allergic to sulfonamide derivatives.  Meds: Current Outpatient Prescriptions  Medication Sig Dispense Refill  . acetaminophen (TYLENOL) 500 MG tablet Take 1,000 mg by mouth every 6 (six) hours as needed.      . gabapentin (NEURONTIN) 300 MG capsule TAKE ONE CAPSULE BY MOUTH EVERY DAY  90 capsule  1  . ibuprofen (ADVIL,MOTRIN) 200 MG tablet Take 200 mg by mouth every 8 (eight) hours as needed. For pain      . lisinopril (PRINIVIL,ZESTRIL) 40 MG tablet TAKE ONE TABLET BY MOUTH EVERY DAY  30 tablet  1  . pentoxifylline (TRENTAL) 400 MG CR tablet Take 1 tablet (400 mg total) by mouth 2 (two) times daily.  60 tablet  5  . vitamin E 400 UNIT capsule Take 400 Units by mouth 2 (two) times daily.       No current facility-administered medications for this encounter.    Physical Findings: The patient is in no acute distress. Patient is alert and oriented. Neuro exam intact (lower extremities MS, DTR, proprioception, and light touch). Speech fluent, gait normal. No significant changes.  Lab Findings: Lab Results  Component Value Date   WBC 7.6 10/23/2012   HGB 13.6 10/23/2012   HCT 42.2 10/23/2012   MCV 84.6 10/23/2012   PLT 196 10/23/2012     @LASTCHEM @  Radiographic Findings: Mr [name] UX Contrast  03/12/2013   *RADIOLOGY REPORT*  Clinical Data: Follow-up left parietal colon cancer metastasis with S R S.  MRI HEAD WITHOUT AND WITH CONTRAST  Technique:  Multiplanar, multiecho pulse sequences of the brain and surrounding structures were obtained according to standard protocol without and with intravenous contrast  Contrast:  17mL MULTIHANCE GADOBENATE DIMEGLUMINE 529 MG/ML IV SOLN at  Comparison: Multiple priors, most recent 11/16/2012.  Findings: The patient underwent resection of a left occipital metastasis September 2012.  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  T2 confluent bright signal and encephalomalacia accompany the area of resection cavity.  Slight increased prominence of restricted diffusion in the adjacent gyri have waxed and waned on previous studies from January 2014 and July 2013, and may be slightly increased today, but there is no dominant area of restricted diffusion to strongly suggest additional radionecrosis. Moderate gyriform as well as nodular enhancement in the left parietal lobe appears fairly stable compared with most recent study from Nov 16 2012. One could argue that the intensity of the enhancement may be diminished slightly. No significant mass effect.  Mild atrophy.  Moderate white matter changes.  No additional lesions are evident. There is no  midline shift.  IMPRESSION: Residual gyriform enhancement surrounding the area of previous surgical resection of a left parietal metastasis with subsequent resection of radionecrosis.  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   Original Report Authenticated By: Davonna Belling, M.D.    Impression:  The patient is stable with NED  Plan:  MRI in 3 months.  _____________________________________  Artist Pais. Kathrynn Running,  M.D. and  [name], M.D.

## 2013-03-18 ENCOUNTER — Ambulatory Visit: Payer: Medicare Other | Admitting: Oncology

## 2013-03-18 ENCOUNTER — Other Ambulatory Visit: Payer: Medicare Other | Admitting: Lab

## 2013-03-20 ENCOUNTER — Other Ambulatory Visit: Payer: Self-pay | Admitting: Oncology

## 2013-03-20 DIAGNOSIS — C189 Malignant neoplasm of colon, unspecified: Secondary | ICD-10-CM

## 2013-03-22 ENCOUNTER — Other Ambulatory Visit: Payer: Self-pay | Admitting: Radiation Therapy

## 2013-03-22 DIAGNOSIS — C7931 Secondary malignant neoplasm of brain: Secondary | ICD-10-CM

## 2013-03-25 ENCOUNTER — Ambulatory Visit (HOSPITAL_BASED_OUTPATIENT_CLINIC_OR_DEPARTMENT_OTHER): Payer: Medicare Other | Admitting: Internal Medicine

## 2013-03-25 ENCOUNTER — Ambulatory Visit: Payer: Self-pay

## 2013-03-25 ENCOUNTER — Encounter: Payer: Self-pay | Admitting: Internal Medicine

## 2013-03-25 ENCOUNTER — Other Ambulatory Visit (HOSPITAL_BASED_OUTPATIENT_CLINIC_OR_DEPARTMENT_OTHER): Payer: Medicare Other | Admitting: Lab

## 2013-03-25 ENCOUNTER — Telehealth: Payer: Self-pay | Admitting: Internal Medicine

## 2013-03-25 VITALS — BP 132/74 | HR 99 | Temp 98.1°F | Resp 18 | Ht 63.0 in | Wt 182.1 lb

## 2013-03-25 DIAGNOSIS — C7931 Secondary malignant neoplasm of brain: Secondary | ICD-10-CM

## 2013-03-25 DIAGNOSIS — C189 Malignant neoplasm of colon, unspecified: Secondary | ICD-10-CM

## 2013-03-25 DIAGNOSIS — Z85038 Personal history of other malignant neoplasm of large intestine: Secondary | ICD-10-CM

## 2013-03-25 DIAGNOSIS — C7800 Secondary malignant neoplasm of unspecified lung: Secondary | ICD-10-CM

## 2013-03-25 DIAGNOSIS — G5790 Unspecified mononeuropathy of unspecified lower limb: Secondary | ICD-10-CM

## 2013-03-25 DIAGNOSIS — Z923 Personal history of irradiation: Secondary | ICD-10-CM

## 2013-03-25 LAB — COMPREHENSIVE METABOLIC PANEL (CC13)
AST: 25 U/L (ref 5–34)
Albumin: 3.6 g/dL (ref 3.5–5.0)
BUN: 14.2 mg/dL (ref 7.0–26.0)
CO2: 27 mEq/L (ref 22–29)
Calcium: 9.9 mg/dL (ref 8.4–10.4)
Chloride: 107 mEq/L (ref 98–109)
Creatinine: 0.8 mg/dL (ref 0.6–1.1)
Glucose: 98 mg/dl (ref 70–140)
Potassium: 4.1 mEq/L (ref 3.5–5.1)

## 2013-03-25 LAB — CBC WITH DIFFERENTIAL/PLATELET
Basophils Absolute: 0.1 10*3/uL (ref 0.0–0.1)
Eosinophils Absolute: 0.2 10*3/uL (ref 0.0–0.5)
HCT: 44.6 % (ref 34.8–46.6)
LYMPH%: 26 % (ref 14.0–49.7)
MCV: 83.4 fL (ref 79.5–101.0)
MONO#: 0.7 10*3/uL (ref 0.1–0.9)
MONO%: 7.7 % (ref 0.0–14.0)
NEUT#: 5.9 10*3/uL (ref 1.5–6.5)
NEUT%: 63 % (ref 38.4–76.8)
Platelets: 215 10*3/uL (ref 145–400)
RBC: 5.34 10*6/uL (ref 3.70–5.45)
WBC: 9.3 10*3/uL (ref 3.9–10.3)

## 2013-03-25 MED ORDER — SODIUM CHLORIDE 0.9 % IJ SOLN
10.0000 mL | INTRAMUSCULAR | Status: DC | PRN
  Administered 2013-03-25: 10 mL via INTRAVENOUS
  Filled 2013-03-25: qty 10

## 2013-03-25 MED ORDER — HEPARIN SOD (PORK) LOCK FLUSH 100 UNIT/ML IV SOLN
500.0000 [IU] | Freq: Once | INTRAVENOUS | Status: AC
  Administered 2013-03-25: 500 [IU] via INTRAVENOUS
  Filled 2013-03-25: qty 5

## 2013-03-25 NOTE — Progress Notes (Signed)
Port flush done on MD side.

## 2013-03-25 NOTE — Progress Notes (Signed)
Hematology and Oncology Follow Up Visit  Kayla [name] [id number] 06-14-1951 62 y.o. 03/25/2013 11:38 PM PCP: [facility] [phone])  Principle Diagnosis: Colon cancer, T4N1M1, Stage IV  Prior Therapy:  Kayla [name] underwent surgical resection of her tumor with a colostomy at the time of her surgery on 10/27/2008. The colostomy has been subsequently reversed on 09/20/2010. Kayla [name] received chemotherapy consisting of FOLFOX for 10 treatments from 12/10/2008 through 06/02/2009. She achieved a partial remission from these treatments. She then received additional chemotherapy with 5-FU, leucovorin, and 5-FU by  continuous infusion along with Avastin from 06/23/2009 through 11/17/2009. 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   Current therapy:  None  Interim History:  She was seen today for followup of her metastatic colon cancer with recurrence in the left parietal region of the brain.  She was last seen by Dr. [name] on 10/24/2011 and 07/03/2012 and 05/03/2012.  Her condition remains stable and without evidence for recurrence of disease.  Her main complaints are paresthesias in her feet related to the oxaliplatin. She states the right is worst than the left.  She denies any recent falls. She denies any headaches, any obvious weakness or residual effects from her brain tumor and its surgery.  She is not driving.  She also complains of sometimes feeling as though something is stuck in her throat when swallowing.  It happens occasionally.  She does not relate it to liquids or solids.  She denies any recent hospitalizations and reports continued compliance to her medicines.   Medications: I have reviewed the patient's current medications.  Current Outpatient Prescriptions  Medication Sig Dispense Refill  . acetaminophen (TYLENOL) 500 MG tablet Take 1,000 mg by mouth every 6 (six) hours as needed.      . gabapentin (NEURONTIN) 300 MG capsule TAKE ONE  CAPSULE BY MOUTH EVERY DAY  90 capsule  1  . ibuprofen (ADVIL,MOTRIN) 200 MG tablet Take 200 mg by mouth every 8 (eight) hours as needed. For pain      . lisinopril (PRINIVIL,ZESTRIL) 40 MG tablet TAKE ONE TABLET BY MOUTH EVERY DAY  30 tablet  0   Current Facility-Administered Medications  Medication Dose Route Frequency Provider Last Rate Last Dose  . sodium chloride 0.9 % injection 10 mL  10 mL Intravenous PRN Myra Rude, MD   10 mL at 03/25/13 1208     Allergies:  Allergies  Allergen Reactions  . Sulfonamide Derivatives Rash   Oncology History: Diagnosis of colon cancer dates back to 10/27/2008 when Kayla [name] presented with what turned out to be a bowel perforation through tumor. Stage at that time was T4 N1 M1 with pulmonary metastatic disease. The K-ras mutation was detected. Kayla [name] underwent surgical resection of her tumor with a colostomy at the time of her surgery on 10/27/2008. The colostomy has been subsequently reversed on 09/20/2010. Kayla [name] received chemotherapy consisting of FOLFOX for 10 treatments from 12/10/2008 through 06/02/2009. She achieved a partial remission from these treatments. She then received additional chemotherapy with 5-FU, leucovorin, and 5-FU by continuous infusion along with Avastin from 06/23/2009 through  11/17/2009. 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 She was doing well without evidence of disease until she developed a right hemiparesthesia in September 2012 and was found to have a 3 x 3 x 2.4 cm, lobulated, enhancing mass in the left parietal lobe with marked surrounding edema. A PET scan on 03/25/2011 showed resolution of the previously identified pulmonary nodules with  no residual hypermetabolic activity. The pericardial effusion, which has been present since diagnosis, was unchanged. There were also uterine fibroids and a low-density lesion in the anterior spleen that was stable and not associated with any hypermetabolic  activity. Of note, Kayla [name] had a markedly  elevated CEA up to 24.7 on 03/23/2011. On 06/23/2011, the CEA was less than 0.5. Dr. [name] resected the recurrent metastatic colon cancer on 03/31/2011. This was followed by stereotactic radiation on 04/15/2011. The patient underwent a left-sided craniotomy and excision of the mass involving her left parietal lobe on 11/10/2011 by Dr. [name]. The pathology report was negative for any malignancy. Pathology report indicated benign brain with fibrosis, hemorrhage, hemosiderin deposition, abundant dystrophic calcifications, and mixed acute and chronic inflammation including foreign body multinucleated giant cells.  y.   Past Medical History, Surgical history, Social history, and Family History were reviewed and updated.  SMOKING HISTORY:  The patient has smoked intermittently, about 2 packs of cigarettes per week, since she was a teenager, but stopped smoking in 2010.  Review of Systems: Constitutional:  Negative for fever, chills, night sweats, anorexia, weight loss, pain. Cardiovascular: no chest pain or dyspnea on exertion Respiratory: no cough, shortness of breath, or wheezing Neurological: no TIA or stroke symptoms Dermatological: negative for rash ENT: negative for - epistaxis, headaches, tinnitus, vertigo or visual changes Skin: Negative. Gastrointestinal: no abdominal pain, change in bowel habits, or black or bloody stools positive for - heartburn Genito-Urinary: no dysuria, trouble voiding, or hematuria Hematological and Lymphatic: negative for - bleeding problems, fatigue, jaundice or pallor Breast: negative for breast lumps Musculoskeletal: positive for - muscular weakness and pins and needles with some numbness (R>L) in lower extremities negative for - gait disturbance Remaining ROS negative. Physical Exam: Blood pressure 132/74, pulse 99, temperature 98.1 F (36.7 C), temperature source Oral, resp. rate 18, height 5\' 3"  (1.6  m), weight 182 lb 1.6 oz (82.6 kg), SpO2 98.00%. ECOG: 0/1 General appearance: alert, cooperative, appears stated age, no distress and mildly obese Head: Normocephalic, without obvious abnormality, atraumatic Neck: no adenopathy, supple, symmetrical, trachea midline and thyroid not enlarged, symmetric, no tenderness/mass/nodules HEENT: PERRLA; EOMi; No sclerae icterus. OP clear of masses.  Lymph nodes: Cervical, supraclavicular, and axillary nodes normal. Heart:regular rate and rhythm, S1, S2 normal, no murmur, click, rub or gallop Lung:chest clear, no wheezing, rales, normal symmetric air entry,  + R sided port-a-cath. Abdomin: soft, non-tender, without masses or organomegaly, normal bowel sounds and surgical scars well healed. EXT:No peripheral edema.  R>L decreased sensation and mildly decreased strength (noted in prior exams) Neuro: R lower extremity weakness and decreased sensation.  Normal gait.  Good finger to nose bilaterally. Otherwise no focal deficits.   Lab Results: Lab Results  Component Value Date   WBC 9.3 03/25/2013   HGB 14.7 03/25/2013   HCT 44.6 03/25/2013   MCV 83.4 03/25/2013   PLT 215 03/25/2013     Chemistry      Component Value Date/Time   NA 143 03/25/2013 1048   NA 140 03/01/2012 1112   NA 142 10/05/2010 1132   K 4.1 03/25/2013 1048   K 4.0 03/01/2012 1112   K 4.0 10/05/2010 1132   CL 108* 10/23/2012 0828   CL 105 03/01/2012 1112   CL 101 10/05/2010 1132   CO2 27 03/25/2013 1048   CO2 26 03/01/2012 1112   CO2 28 10/05/2010 1132   BUN 14.2 03/25/2013 1048   BUN 14 03/01/2012 1112   BUN 13  10/05/2010 1132   CREATININE 0.8 03/25/2013 1048   CREATININE 0.68 03/01/2012 1112   CREATININE 0.8 10/05/2010 1132      Component Value Date/Time   CALCIUM 9.9 03/25/2013 1048   CALCIUM 9.5 03/01/2012 1112   CALCIUM 9.4 10/05/2010 1132   ALKPHOS 115 03/25/2013 1048   ALKPHOS 117 03/01/2012 1112   ALKPHOS 184* 10/05/2010 1132   AST 25 03/25/2013 1048   AST 17 03/01/2012 1112   AST 28 10/05/2010 1132    ALT 28 03/25/2013 1048   ALT 13 03/01/2012 1112   ALT 26 10/05/2010 1132   BILITOT 0.98 03/25/2013 1048   BILITOT 0.9 03/01/2012 1112   BILITOT 0.60 10/05/2010 1132      Radiological Studies: 1. MRI of the head with and without IV contrast on 03/23/2011 showed a solitary enhancing mass in the left parietal lobe with surrounding white matter edema, most compatible with solitary metastatic deposit. The mass lesion measured 2.7 x 2.5 cm.  2. PET scan from 03/25/2011 showed resolution of the previously- identified pulmonary nodules with no residual hypermetabolic activity noted in the neck, chest, abdomen or pelvis to suggest active malignancy. There was a pericardial effusion and some subsegmental atelectasis in the lower lobes. There were also uterine fibroids and a low-density lesion in the anterior spleen that was stable and not associated with hypermetabolic activity.  3. MRI of the head with and without IV contrast showed Stealth protocol utilized to evaluate left parietal mass. The mass measured 3 x 3 x 2.4 cm with marked surrounding vasogenic edema.  4. MRI of the head with and without IV contrast on 04/08/2011 showed postsurgical changes of tumor removal in the left parietal lobe and a postsurgical hematoma with enhancement. There was 3 mm of midline shift. No other metastatic deposits.  5. MRI of the head with and without IV contrast on 06/28/2011 showed a 3 cm area of restricted diffusion lying within the previous area of  left parietal surgical resection for colon cancer. There was enhancement of the peripheral aspect of the cavity, marked restricted diffusion and increasing vasogenic edema. There was concern about the development of a brain abscess.  6. MRI of the head with and without IV contrast on 07/29/2011 showed left parietal postoperative hematoma was smaller compared with the  prior study. There was no nodular enhancement seen to indicate tumor. No new enhancing lesions were seen. There  was significant  improvement in the left parietal edema.  7. Digital screening mammogram was negative on 08/02/2011.  8. An MRI of the head with and without IV contrast on 10/21/2011 showed development of gyriform enhancement circumferentially along  the margins of the left parietal postoperative space/hematoma. Marked increase in vasogenic edema was seen. 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 There was further contraction of the hematoma/postoperative  space in the left parietal cortical region, now measuring 17 x 19 mm in transverse diameter as opposed to 22 x 21 mm previously.  Left-to-right shift was 2 mm.  9. Nuclear medicine brain PET-CT scan carried out on 10/27/2011 showed hypermetabolic activity in the high left parietal lobe which  corresponds to gyriform enhancement on comparison MRI from 10/21/2011. This was felt to be most consistent with recurrent  tumor.  10. Chest x-ray, 2 view from 11/04/2011, showed chronic cardiomegaly. Port-A-Cath was in place. No acute disease was present.  11. MRI of the head with and without IV contrast on 01/27/2012 showed findings that  were felt to be consistent with progression of disease.  This exam was compared with the MRI of 10/21/2011. It was recognized that the patient underwent surgical resection of the mass on 11/10/2011.  There was felt to be on the present exam residual peripheral enhancement of approximately 35 x 40 x 21 mm with infiltration of the cortex and  deep white matter. It was felt that the enhancement had progressed, despite removal of the central focus. There was moderate vasogenic  edema throughout the left hemisphere but slightly decreased from the MRI from 10/21/2011. No new lesions were seen. There was mild atrophy with  chronic microvascular ischemic change, but no midline shift. Major intracranial vascular structures were patent.  12. MRI of the brain with and without IV  contrast on 05/04/2012 showed previous left parietal lobe surgery for resection of tumor and radiation necrosis.  No new abnormalities were noted. 13. Chest x-ray, 2 view, on 07/03/2012 showed enlargement of the cardiac silhouette with some lingular scarring, as compared with the chest x-ray from 11/04/2011. 14. 2-D echocardiogram from 07/12/2012 showed left ventricular ejection fraction of 55-60%.  There was a small to moderate circumferential pericardial effusion with no signs of tamponade.  There was felt to be no significant change from the 2-D echocardiogram dated 08/13/2009. 15. Digital bilateral screening mammogram on 08/06/2012 was negative. 16. 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  There was no progression or new brain metastasis identified. 17. PET scan from 10/16/2012 showed no specific features to suggest metastatic disease.  There was diffuse uptake throughout the thyroid gland, possibly due to hyperthyroidism. 18. MRI of the brain with and without IV contrast on 03/12/2013 showed residual gyriform enhancement surrounding the area of previous  surgical resection of a left parietal metastasis with subsequent resection of radionecrosis. The findings likely represent some residual brain injury, but there is no significant progression of radionecrosis and no new lesions seen.  Impression and Plan: 1. Stage IV Colon Cancer mets to lung/brain.  --Mozetta continues to do well with no evidence for disease recurrence.  She is now out nearly 4.5 years from the time of diagnosis and nearly 2 years from the time of diagnosis of her right brain recurrence.  Clinically, she is doing well.  Her CEA in April was 1.2.  We will await CEA from today. Will continue to flush the Port-A-Cath every 2 months with heparin.  Nariya will return to the clinic  again in 4 months, at which time we will check CBC, chemistries, LDH, and CEA.  She is having repeat MRIs of the brain ordered by Dr. Kathrynn Running about every 3-4 months.  Spent more than half the time coordinating care.    Clyde Zarrella, MD 9/8/201411:38 PM

## 2013-03-25 NOTE — Telephone Encounter (Signed)
gv and printed appt sched and avs for pt for NOV °

## 2013-03-26 ENCOUNTER — Telehealth: Payer: Self-pay | Admitting: Internal Medicine

## 2013-03-26 LAB — CEA: CEA: 1.4 ng/mL (ref 0.0–5.0)

## 2013-03-26 NOTE — Telephone Encounter (Signed)
s.w. pt and advised on appt d/t change per 9.8.14 pof...advised on Nove and Dec appts.

## 2013-04-30 ENCOUNTER — Other Ambulatory Visit: Payer: Self-pay | Admitting: Medical Oncology

## 2013-04-30 DIAGNOSIS — C189 Malignant neoplasm of colon, unspecified: Secondary | ICD-10-CM

## 2013-04-30 MED ORDER — LISINOPRIL 40 MG PO TABS: 40.0000 mg | ORAL_TABLET | Freq: Every day | ORAL | Status: DC

## 2013-05-20 ENCOUNTER — Ambulatory Visit: Payer: Medicare Other

## 2013-05-20 ENCOUNTER — Ambulatory Visit (HOSPITAL_BASED_OUTPATIENT_CLINIC_OR_DEPARTMENT_OTHER): Payer: Medicare Other

## 2013-05-20 ENCOUNTER — Other Ambulatory Visit: Payer: Medicare Other | Admitting: Lab

## 2013-05-20 VITALS — BP 131/85 | HR 81 | Temp 98.1°F | Resp 18

## 2013-05-20 DIAGNOSIS — Z95828 Presence of other vascular implants and grafts: Secondary | ICD-10-CM

## 2013-05-20 DIAGNOSIS — C186 Malignant neoplasm of descending colon: Secondary | ICD-10-CM

## 2013-05-20 DIAGNOSIS — Z452 Encounter for adjustment and management of vascular access device: Secondary | ICD-10-CM

## 2013-05-20 MED ORDER — HEPARIN SOD (PORK) LOCK FLUSH 100 UNIT/ML IV SOLN
500.0000 [IU] | Freq: Once | INTRAVENOUS | Status: AC
  Administered 2013-05-20: 500 [IU] via INTRAVENOUS
  Filled 2013-05-20: qty 5

## 2013-05-20 MED ORDER — SODIUM CHLORIDE 0.9 % IJ SOLN
10.0000 mL | INTRAMUSCULAR | Status: DC | PRN
  Administered 2013-05-20: 10 mL via INTRAVENOUS
  Filled 2013-05-20: qty 10

## 2013-06-12 ENCOUNTER — Other Ambulatory Visit: Payer: Self-pay | Admitting: Radiation Therapy

## 2013-06-12 DIAGNOSIS — C7931 Secondary malignant neoplasm of brain: Secondary | ICD-10-CM

## 2013-06-17 ENCOUNTER — Telehealth: Payer: Self-pay | Admitting: *Deleted

## 2013-06-17 NOTE — Telephone Encounter (Signed)
CALLED PATIENT TO ASK ABOUT COMING IN FOR LABS, LVM FOR A RETURN CALL 

## 2013-06-21 ENCOUNTER — Ambulatory Visit
Admission: RE | Admit: 2013-06-21 | Discharge: 2013-06-21 | Disposition: A | Discharge status: Home / Self Care | Payer: Medicare Other | Source: Ambulatory Visit | Attending: Radiation Oncology | Admitting: Radiation Oncology

## 2013-06-21 DIAGNOSIS — C7931 Secondary malignant neoplasm of brain: Secondary | ICD-10-CM

## 2013-06-21 DIAGNOSIS — C3490 Malignant neoplasm of unspecified part of unspecified bronchus or lung: Secondary | ICD-10-CM | POA: Insufficient documentation

## 2013-06-21 LAB — BUN AND CREATININE (CC13)
BUN: 11.8 mg/dL (ref 7.0–26.0)
Creatinine: 0.9 mg/dL (ref 0.6–1.1)

## 2013-06-21 MED ORDER — GADOBENATE DIMEGLUMINE 529 MG/ML IV SOLN
17.0000 mL | Freq: Once | INTRAVENOUS | Status: AC | PRN
  Administered 2013-06-21: 17 mL via INTRAVENOUS

## 2013-06-23 ENCOUNTER — Encounter: Payer: Self-pay | Admitting: Radiation Oncology

## 2013-06-23 NOTE — Progress Notes (Signed)
Radiation Oncology         [phone]) [phone] ________________________________  Name: Kayla [name] MRN: [id number]  Date: 06/24/2013  DOB: 05/11/1951  Multidisciplinary Neuro Oncology Clinic Follow-Up Visit Note  CC: No primary provider on file.  No ref. 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 [name] from neurosurgery.  The recent films were presented in our multidisciplinary conference with neuroradiology just prior to the clinic.  She is asymptomatic.                              ALLERGIES:  is allergic to sulfonamide derivatives.  Meds: Current Outpatient Prescriptions  Medication Sig Dispense Refill  . acetaminophen (TYLENOL) 500 MG tablet Take 1,000 mg by mouth every 6 (six) hours as needed.      . ciprofloxacin (CIPRO) 500 MG tablet Take 500 mg by mouth 2 (two) times daily. For UTI      . gabapentin (NEURONTIN) 300 MG capsule TAKE ONE CAPSULE BY MOUTH EVERY DAY  90 capsule  1  . ibuprofen (ADVIL,MOTRIN) 200 MG tablet Take 200 mg by mouth every 8 (eight) hours as needed. For pain      . lisinopril (PRINIVIL,ZESTRIL) 40 MG tablet Take 1 tablet (40 mg total) by mouth daily.  30 tablet  3   No current facility-administered medications for this encounter.    Physical Findings: The patient is in no acute distress. Patient is alert and oriented.  weight is 186 lb 4.8 oz (84.505 kg). Her oral temperature is 98.4 F (36.9 C). Her blood pressure is 130/66 and her pulse is 88. Her respiration is 16 and oxygen saturation is 98%. . Motor strength intact.   No significant changes.  Lab Findings: Lab Results  Component Value Date   WBC 9.3 03/25/2013   HGB 14.7 03/25/2013   HCT 44.6 03/25/2013   MCV 83.4 03/25/2013   PLT 215 03/25/2013    Radiographic  Findings: Mr Kayla [name] WJ Contrast  06/21/2013   CLINICAL DATA:  Stereotactic radiation surgery 27 month restaging examination. History of colon cancer.  EXAM: MRI HEAD WITHOUT AND WITH CONTRAST  TECHNIQUE: Multiplanar, multiecho pulse sequences of the brain and surrounding structures were obtained without and with intravenous contrast.  CONTRAST:  17mL MULTIHANCE GADOBENATE DIMEGLUMINE 529 MG/ML IV SOLN  COMPARISON:  Several prior exams most recent 03/12/2013.  FINDINGS: Prior history stated patient underwent resection of left parietal metastatic lesion September 2012 with subsequent radiation necrosis development in 2013 requiring resection April 2013.  Post left parietal craniotomy for resection of tumor and radiation necrosis. The residual enhancement and surrounding T2 altered signal intensity appears similar to prior two examinations. No new enhancing lesion noted.  Prominent white matter type changes asymmetric greater on the left unchanged.  No acute thrombotic infarct.  No intracranial hemorrhage.  Major intracranial vascular structures are patent.  Slightly heterogeneous appearance bone marrow stable.  IMPRESSION: Stable post therapy exam as noted above.   Electronically Signed   By: Bridgett Larsson M.D.   On: 06/21/2013 16:36   Impression:  The patient has no evidence of intracranial recurrence.  Plan:  MRI in 3-4 months then follow-up.  _____________________________________  Artist Pais. Kathrynn Running,  M.D. and  [name], M.D.

## 2013-06-24 ENCOUNTER — Ambulatory Visit
Admission: RE | Admit: 2013-06-24 | Discharge: 2013-06-24 | Disposition: A | Discharge status: Home / Self Care | Payer: Medicare Other | Source: Ambulatory Visit | Attending: Radiation Oncology | Admitting: Radiation Oncology

## 2013-06-24 ENCOUNTER — Encounter: Payer: Self-pay | Admitting: Radiation Oncology

## 2013-06-24 VITALS — BP 130/66 | HR 88 | Temp 98.4°F | Resp 16 | Wt 186.3 lb

## 2013-06-24 DIAGNOSIS — C7931 Secondary malignant neoplasm of brain: Secondary | ICD-10-CM

## 2013-06-24 NOTE — Progress Notes (Signed)
Denies nausea, vomiting, dizziness, ringing in the ears, or diplopia. Reports occasional temporal pressure but, denies this is painful. Denies pain at this time. Denies episodes of confusion or forgetfulness. Affect appropriate. Gait steady. Not taking decadron at this time.

## 2013-06-30 NOTE — Addendum Note (Signed)
Encounter addended by: [name], MD on: 06/30/2013  8:48 PM<BR>     Documentation filed: Clinical Notes

## 2013-07-05 ENCOUNTER — Telehealth: Payer: Self-pay | Admitting: Internal Medicine

## 2013-07-05 NOTE — Telephone Encounter (Signed)
, °

## 2013-07-15 ENCOUNTER — Ambulatory Visit: Payer: Medicare Other

## 2013-07-15 ENCOUNTER — Other Ambulatory Visit: Payer: Medicare Other

## 2013-07-15 ENCOUNTER — Other Ambulatory Visit (HOSPITAL_BASED_OUTPATIENT_CLINIC_OR_DEPARTMENT_OTHER): Payer: Medicare Other

## 2013-07-15 ENCOUNTER — Telehealth: Payer: Self-pay | Admitting: *Deleted

## 2013-07-15 ENCOUNTER — Encounter: Payer: Self-pay | Admitting: Physician Assistant

## 2013-07-15 ENCOUNTER — Other Ambulatory Visit: Payer: Self-pay | Admitting: Internal Medicine

## 2013-07-15 ENCOUNTER — Ambulatory Visit (HOSPITAL_BASED_OUTPATIENT_CLINIC_OR_DEPARTMENT_OTHER): Payer: Medicare Other

## 2013-07-15 ENCOUNTER — Ambulatory Visit (HOSPITAL_BASED_OUTPATIENT_CLINIC_OR_DEPARTMENT_OTHER): Payer: Medicare Other | Admitting: Physician Assistant

## 2013-07-15 VITALS — BP 143/74 | HR 94 | Temp 97.6°F

## 2013-07-15 VITALS — BP 148/86 | HR 95 | Temp 98.0°F | Resp 20 | Ht 63.0 in | Wt 189.4 lb

## 2013-07-15 DIAGNOSIS — C186 Malignant neoplasm of descending colon: Secondary | ICD-10-CM | POA: Diagnosis not present

## 2013-07-15 DIAGNOSIS — C7800 Secondary malignant neoplasm of unspecified lung: Secondary | ICD-10-CM

## 2013-07-15 DIAGNOSIS — C189 Malignant neoplasm of colon, unspecified: Secondary | ICD-10-CM

## 2013-07-15 DIAGNOSIS — C7931 Secondary malignant neoplasm of brain: Secondary | ICD-10-CM

## 2013-07-15 DIAGNOSIS — Z139 Encounter for screening, unspecified: Secondary | ICD-10-CM

## 2013-07-15 DIAGNOSIS — R319 Hematuria, unspecified: Secondary | ICD-10-CM

## 2013-07-15 DIAGNOSIS — Z95828 Presence of other vascular implants and grafts: Secondary | ICD-10-CM

## 2013-07-15 LAB — URINALYSIS, MICROSCOPIC - CHCC
Glucose: NEGATIVE mg/dL
Ketones: NEGATIVE mg/dL
Nitrite: NEGATIVE
Protein: NEGATIVE mg/dL
Specific Gravity, Urine: 1.02 (ref 1.003–1.035)
Urobilinogen, UR: 0.2 mg/dL (ref 0.2–1)
pH: 6 (ref 4.6–8.0)

## 2013-07-15 LAB — COMPREHENSIVE METABOLIC PANEL (CC13)
ALT: 26 U/L (ref 0–55)
Albumin: 3.4 g/dL — ABNORMAL LOW (ref 3.5–5.0)
Alkaline Phosphatase: 100 U/L (ref 40–150)
CO2: 23 mEq/L (ref 22–29)
Potassium: 3.8 mEq/L (ref 3.5–5.1)
Sodium: 143 mEq/L (ref 136–145)
Total Bilirubin: 0.77 mg/dL (ref 0.20–1.20)
Total Protein: 7 g/dL (ref 6.4–8.3)

## 2013-07-15 LAB — CBC WITH DIFFERENTIAL/PLATELET
BASO%: 0.9 % (ref 0.0–2.0)
Eosinophils Absolute: 0.3 10*3/uL (ref 0.0–0.5)
LYMPH%: 25.7 % (ref 14.0–49.7)
MCHC: 33.5 g/dL (ref 31.5–36.0)
MONO#: 0.6 10*3/uL (ref 0.1–0.9)
MONO%: 6 % (ref 0.0–14.0)
NEUT#: 6.2 10*3/uL (ref 1.5–6.5)
Platelets: 193 10*3/uL (ref 145–400)
RBC: 4.78 10*6/uL (ref 3.70–5.45)
RDW: 14.6 % — ABNORMAL HIGH (ref 11.2–14.5)
WBC: 9.6 10*3/uL (ref 3.9–10.3)

## 2013-07-15 LAB — LACTATE DEHYDROGENASE (CC13): LDH: 185 U/L (ref 125–245)

## 2013-07-15 MED ORDER — SODIUM CHLORIDE 0.9 % IJ SOLN
10.0000 mL | INTRAMUSCULAR | Status: DC | PRN
  Administered 2013-07-15: 10 mL via INTRAVENOUS
  Filled 2013-07-15: qty 10

## 2013-07-15 MED ORDER — HEPARIN SOD (PORK) LOCK FLUSH 100 UNIT/ML IV SOLN
500.0000 [IU] | Freq: Once | INTRAVENOUS | Status: AC
  Administered 2013-07-15: 500 [IU] via INTRAVENOUS
  Filled 2013-07-15: qty 5

## 2013-07-15 NOTE — Telephone Encounter (Signed)
appts made and printed. Pt is aware that cs will call w/ appt for ct abd/pelvis/& chest...td

## 2013-07-15 NOTE — Progress Notes (Signed)
Hematology and Oncology Follow Up Visit  Kayla [name] [id number] 1951/01/23 62 y.o. 07/15/2013 4:30 PM PCP: [facility] [phone])  Principle Diagnosis: Colon cancer, T4N1M1, Stage IV  Prior Therapy:  Kayla [name] underwent surgical resection of her tumor with a colostomy at the time of her surgery on 10/27/2008. The colostomy has been subsequently reversed on 09/20/2010. Kayla [name] received chemotherapy consisting of FOLFOX for 10 treatments from 12/10/2008 through 06/02/2009. She achieved a partial remission from these treatments. She then received additional chemotherapy with 5-FU, leucovorin, and 5-FU by  continuous infusion along with Avastin from 06/23/2009 through 11/17/2009. 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   Current therapy:  None  Interim History:  She was seen today for followup of her metastatic colon cancer with recurrence in the left parietal region of the brain. She had an MRI of her brain done on 06/21/2013 was read as stable with no evidence for disease recurrence or metastasis. She had her Port-A-Cath flush today when she had her labs drawn. Overall she's been doing quite well since being seen by Dr. Baltazar Apo on 03/25/2013. She does report that she noticed some blood in her urine last night. She describes it as a small amount of bright red blood. She states that she completed a course of Cipro for a urinary tract infection about 7-10 days ago. She denied fever, chills or suprapubic for a flank pain. Her condition remains stable and without evidence for recurrence of disease.   She denies any headaches, any obvious weakness or residual effects from her brain tumor and its surgery.  She is not driving. She denies any recent hospitalizations and reports continued compliance to her medicines.   Medications: I have reviewed the patient's current medications.  Current Outpatient Prescriptions  Medication Sig Dispense Refill  .  acetaminophen (TYLENOL) 500 MG tablet Take 1,000 mg by mouth every 6 (six) hours as needed.      . gabapentin (NEURONTIN) 300 MG capsule TAKE ONE CAPSULE BY MOUTH EVERY DAY  90 capsule  1  . ibuprofen (ADVIL,MOTRIN) 200 MG tablet Take 200 mg by mouth every 8 (eight) hours as needed. For pain      . lisinopril (PRINIVIL,ZESTRIL) 40 MG tablet Take 1 tablet (40 mg total) by mouth daily.  30 tablet  3  . ciprofloxacin (CIPRO) 500 MG tablet Take 500 mg by mouth 2 (two) times daily. For UTI       No current facility-administered medications for this visit.     Allergies:  Allergies  Allergen Reactions  . Sulfonamide Derivatives Rash   Oncology History: Diagnosis of colon cancer dates back to 10/27/2008 when Kayla [name] presented with what turned out to be a bowel perforation through tumor. Stage at that time was T4 N1 M1 with pulmonary metastatic disease. The K-ras mutation was detected. Kayla [name] underwent surgical resection of her tumor with a colostomy at the time of her surgery on 10/27/2008. The colostomy has been subsequently reversed on 09/20/2010. Kayla [name] received chemotherapy consisting of FOLFOX for 10 treatments from 12/10/2008 through 06/02/2009. She achieved a partial remission from these treatments. She then received additional chemotherapy with 5-FU, leucovorin, and 5-FU by continuous infusion along with Avastin from 06/23/2009 through  11/17/2009. 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 She was doing well without evidence of disease until she developed a right hemiparesthesia in September 2012 and was found to have a 3 x 3 x 2.4 cm, lobulated, enhancing mass in the  left parietal lobe with marked surrounding edema. A PET scan on 03/25/2011 showed resolution of the previously identified pulmonary nodules with no residual hypermetabolic activity. The pericardial effusion, which has been present since diagnosis, was unchanged. There were also uterine fibroids and a  low-density lesion in the anterior spleen that was stable and not associated with any hypermetabolic activity. Of note, Kayla [name] had a markedly  elevated CEA up to 24.7 on 03/23/2011. On 06/23/2011, the CEA was less than 0.5. Dr. [name] resected the recurrent metastatic colon cancer on 03/31/2011. This was followed by stereotactic radiation on 04/15/2011. The patient underwent a left-sided craniotomy and excision of the mass involving her left parietal lobe on 11/10/2011 by Dr. [name]. The pathology report was negative for any malignancy. Pathology report indicated benign brain with fibrosis, hemorrhage, hemosiderin deposition, abundant dystrophic calcifications, and mixed acute and chronic inflammation including foreign body multinucleated giant cells.  y.   Past Medical History, Surgical history, Social history, and Family History were reviewed and updated.  SMOKING HISTORY:  The patient has smoked intermittently, about 2 packs of cigarettes per week, since she was a teenager, but stopped smoking in 2010.  Review of Systems: Constitutional:  Negative for fever, chills, night sweats, anorexia, weight loss, pain. Cardiovascular: no chest pain or dyspnea on exertion Respiratory: no cough, shortness of breath, or wheezing Neurological: no TIA or stroke symptoms Dermatological: negative for rash ENT: negative for - epistaxis, headaches, tinnitus, vertigo or visual changes Skin: Negative. Gastrointestinal: no abdominal pain, change in bowel habits, or black or bloody stools positive for - heartburn Genito-Urinary: positive for - hematuria Hematological and Lymphatic: negative for - bleeding problems, fatigue, jaundice or pallor Breast: negative for breast lumps Musculoskeletal: negative Remaining ROS negative.  Physical Exam: Blood pressure 148/86, pulse 95, temperature 98 F (36.7 C), temperature source Oral, resp. rate 20, height 5\' 3"  (1.6 m), weight 189 lb 6.4 oz (85.911  kg). ECOG: 0/1 General appearance: alert, cooperative, appears stated age, no distress and mildly obese Head: Normocephalic, without obvious abnormality, atraumatic Neck: no adenopathy, supple, symmetrical, trachea midline and thyroid not enlarged, symmetric, no tenderness/mass/nodules HEENT: PERRLA; EOMi; No sclerae icterus. OP clear of masses.  Lymph nodes: Cervical, supraclavicular, and axillary nodes normal. Heart:regular rate and rhythm, S1, S2 normal, no murmur, click, rub or gallop Lung:chest clear, no wheezing, rales, normal symmetric air entry,  + R sided port-a-cath. Abdomin: soft, non-tender, without masses or organomegaly, normal bowel sounds and surgical scars well healed. no suprapubic or CVA tenderness EXT:No peripheral edema.   Neuro: Normal gait.  Good finger to nose bilaterally. No focal deficits.   Lab Results: Lab Results  Component Value Date   WBC 9.6 07/15/2013   HGB 13.5 07/15/2013   HCT 40.4 07/15/2013   MCV 84.5 07/15/2013   PLT 193 07/15/2013     Chemistry      Component Value Date/Time   NA 143 07/15/2013 0946   NA 140 03/01/2012 1112   NA 142 10/05/2010 1132   K 3.8 07/15/2013 0946   K 4.0 03/01/2012 1112   K 4.0 10/05/2010 1132   CL 108* 10/23/2012 0828   CL 105 03/01/2012 1112   CL 101 10/05/2010 1132   CO2 23 07/15/2013 0946   CO2 26 03/01/2012 1112   CO2 28 10/05/2010 1132   BUN 12.9 07/15/2013 0946   BUN 14 03/01/2012 1112   BUN 13 10/05/2010 1132   CREATININE 0.8 07/15/2013 0946   CREATININE 0.68 03/01/2012 1112  CREATININE 0.8 10/05/2010 1132      Component Value Date/Time   CALCIUM 9.1 07/15/2013 0946   CALCIUM 9.5 03/01/2012 1112   CALCIUM 9.4 10/05/2010 1132   ALKPHOS 100 07/15/2013 0946   ALKPHOS 117 03/01/2012 1112   ALKPHOS 184* 10/05/2010 1132   AST 23 07/15/2013 0946   AST 17 03/01/2012 1112   AST 28 10/05/2010 1132   ALT 26 07/15/2013 0946   ALT 13 03/01/2012 1112   ALT 26 10/05/2010 1132   BILITOT 0.77 07/15/2013 0946   BILITOT 0.9  03/01/2012 1112   BILITOT 0.60 10/05/2010 1132      Radiological Studies: 1. MRI of the head with and without IV contrast on 03/23/2011 showed a solitary enhancing mass in the left parietal lobe with surrounding white matter edema, most compatible with solitary metastatic deposit. The mass lesion measured 2.7 x 2.5 cm.  2. PET scan from 03/25/2011 showed resolution of the previously- identified pulmonary nodules with no residual hypermetabolic activity noted in the neck, chest, abdomen or pelvis to suggest active malignancy. There was a pericardial effusion and some subsegmental atelectasis in the lower lobes. There were also uterine fibroids and a low-density lesion in the anterior spleen that was stable and not associated with hypermetabolic activity.  3. MRI of the head with and without IV contrast showed Stealth protocol utilized to evaluate left parietal mass. The mass measured 3 x 3 x 2.4 cm with marked surrounding vasogenic edema.  4. MRI of the head with and without IV contrast on 04/08/2011 showed postsurgical changes of tumor removal in the left parietal lobe and a postsurgical hematoma with enhancement. There was 3 mm of midline shift. No other metastatic deposits.  5. MRI of the head with and without IV contrast on 06/28/2011 showed a 3 cm area of restricted diffusion lying within the previous area of  left parietal surgical resection for colon cancer. There was enhancement of the peripheral aspect of the cavity, marked restricted diffusion and increasing vasogenic edema. There was concern about the development of a brain abscess.  6. MRI of the head with and without IV contrast on 07/29/2011 showed left parietal postoperative hematoma was smaller compared with the  prior study. There was no nodular enhancement seen to indicate tumor. No new enhancing lesions were seen. There was significant  improvement in the left parietal edema.  7. Digital screening mammogram was negative on 08/02/2011.   8. An MRI of the head with and without IV contrast on 10/21/2011 showed development of gyriform enhancement circumferentially along  the margins of the left parietal postoperative space/hematoma. Marked increase in vasogenic edema was seen. 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 There was further contraction of the hematoma/postoperative  space in the left parietal cortical region, now measuring 17 x 19 mm in transverse diameter as opposed to 22 x 21 mm previously.  Left-to-right shift was 2 mm.  9. Nuclear medicine brain PET-CT scan carried out on 10/27/2011 showed hypermetabolic activity in the high left parietal lobe which  corresponds to gyriform enhancement on comparison MRI from 10/21/2011. This was felt to be most consistent with recurrent  tumor.  10. Chest x-ray, 2 view from 11/04/2011, showed chronic cardiomegaly. Port-A-Cath was in place. No acute disease was present.  11. MRI of the head with and without IV contrast on 01/27/2012 showed findings that were felt to be consistent with progression of disease.  This exam was compared with the  MRI of 10/21/2011. It was recognized that the patient underwent surgical resection of the mass on 11/10/2011.  There was felt to be on the present exam residual peripheral enhancement of approximately 35 x 40 x 21 mm with infiltration of the cortex and  deep white matter. It was felt that the enhancement had progressed, despite removal of the central focus. There was moderate vasogenic  edema throughout the left hemisphere but slightly decreased from the MRI from 10/21/2011. No new lesions were seen. There was mild atrophy with  chronic microvascular ischemic change, but no midline shift. Major intracranial vascular structures were patent.  12. MRI of the brain with and without IV contrast on 05/04/2012 showed previous left parietal lobe surgery for resection of tumor and radiation necrosis.  No  new abnormalities were noted. 13. Chest x-ray, 2 view, on 07/03/2012 showed enlargement of the cardiac silhouette with some lingular scarring, as compared with the chest x-ray from 11/04/2011. 14. 2-D echocardiogram from 07/12/2012 showed left ventricular ejection fraction of 55-60%.  There was a small to moderate circumferential pericardial effusion with no signs of tamponade.  There was felt to be no significant change from the 2-D echocardiogram dated 08/13/2009. 15. Digital bilateral screening mammogram on 08/06/2012 was negative. 16. 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  There was no progression or new brain metastasis identified. 17. PET scan from 10/16/2012 showed no specific features to suggest metastatic disease.  There was diffuse uptake throughout the thyroid gland, possibly due to hyperthyroidism. 18. MRI of the brain with and without IV contrast on 03/12/2013 showed residual gyriform enhancement surrounding the area of previous  surgical resection of a left parietal metastasis with subsequent resection of radionecrosis. The findings likely represent some residual brain injury, but there is no significant progression of radionecrosis and no new lesions seen. 19. MRI of the brain with and without contrast performed 06/21/2013 was read as stable post therapy exam  Impression and Plan: 1. Stage IV Colon Cancer mets to lung/brain.  --Kayla [name] continues to do well with no evidence for disease recurrence.  She is now out nearly 4.8 years from the time of diagnosis and nearly 2 years from the time of diagnosis of her right brain recurrence.  Clinically, she is doing well.  Her CEA in September was  was 1.4 and today's value is 1.1.  Will continue to flush the Port-A-Cath every 2 months with heparin. Patient was discussed with Dr. Rosie Fate. To  further evaluate her complaints of hematuria and we'll obtain a clean catch urinalysis and urine culture and sensitivity. Should these studies be positive for infection appropriate antibiotic therapy will be instituted. Kayla [name] will return to the clinic again in 4 months, at which time we will check CBC, chemistries, LDH, and CEA AS WELL as a CT of the chest, abdomen and pelvis with contrast to reevaluate her disease..  She is having repeat MRIs of the brain ordered by Dr. Kathrynn Running about every 3-4 months.  Spent more than half the time coordinating care.    Kayla [name] E, PA-C  12/29/20144:30 PM

## 2013-07-15 NOTE — Patient Instructions (Signed)
We are awaiting the culture and sensitivity results from your urine studies today. We will call you with the results and as well as call you in a prescription for any necessary antibiotics. These results should be available 07/17/2013 Followup with Dr. Rosie Fate in 4 months with restaging CT scan of your chest, abdomen and pelvis i as well as a recheck of your tumor marker to reevaluate her disease

## 2013-07-15 NOTE — Patient Instructions (Signed)
Implanted Port Instructions  An implanted port is a central line that has a round shape and is placed under the skin. It is used for long-term IV (intravenous) access for:  · Medicine.  · Fluids.  · Liquid nutrition, such as TPN (total parenteral nutrition).  · Blood samples.  Ports can be placed:  · In the chest area just below the collarbone (this is the most common place.)  · In the arms.  · In the belly (abdomen) area.  · In the legs.  PARTS OF THE PORT  A port has 2 main parts:  · The reservoir. The reservoir is round, disc-shaped, and will be a small, raised area under your skin.  · The reservoir is the part where a needle is inserted (accessed) to either give medicines or to draw blood.  · The catheter. The catheter is a long, slender tube that extends from the reservoir. The catheter is placed into a large vein.  · Medicine that is inserted into the reservoir goes into the catheter and then into the vein.  INSERTION OF THE PORT  · The port is surgically placed in either an operating room or in a procedural area (interventional radiology).  · Medicine may be given to help you relax during the procedure.  · The skin where the port will be inserted is numbed (local anesthetic).  · 1 or 2 small cuts (incisions) will be made in the skin to insert the port.  · The port can be used after it has been inserted.  INCISION SITE CARE  · The incision site may have small adhesive strips on it. This helps keep the incision site closed. Sometimes, no adhesive strips are placed. Instead of adhesive strips, a special kind of surgical glue is used to keep the incision closed.  · If adhesive strips were placed on the incision sites, do not take them off. They will fall off on their own.  · The incision site may be sore for 1 to 2 days. Pain medicine can help.  · Do not get the incision site wet. Bathe or shower as directed by your caregiver.  · The incision site should heal in 5 to 7 days. A small scar may form after the  incision has healed.  ACCESSING THE PORT  Special steps must be taken to access the port:  · Before the port is accessed, a numbing cream can be placed on the skin. This helps numb the skin over the port site.  · A sterile technique is used to access the port.  · The port is accessed with a needle. Only "non-coring" port needles should be used to access the port. Once the port is accessed, a blood return should be checked. This helps ensure the port is in the vein and is not clogged (clotted).  · If your caregiver believes your port should remain accessed, a clear (transparent) bandage will be placed over the needle site. The bandage and needle will need to be changed every week or as directed by your caregiver.  · Keep the bandage covering the needle clean and dry. Do not get it wet. Follow your caregiver's instructions on how to take a shower or bath when the port is accessed.  · If your port does not need to stay accessed, no bandage is needed over the port.  FLUSHING THE PORT  Flushing the port keeps it from getting clogged. How often the port is flushed depends on:  · If a   constant infusion is running. If a constant infusion is running, the port may not need to be flushed.  · If intermittent medicines are given.  · If the port is not being used.  For intermittent medicines:  · The port will need to be flushed:  · After medicines have been given.  · After blood has been drawn.  · As part of routine maintenance.  · A port is normally flushed with:  · Normal saline.  · Heparin.  · Follow your caregiver's advice on how often, how much, and the type of flush to use on your port.  IMPORTANT PORT INFORMATION  · Tell your caregiver if you are allergic to heparin.  · After your port is placed, you will get a manufacturer's information card. The card has information about your port. Keep this card with you at all times.  · There are many types of ports available. Know what kind of port you have.  · In case of an  emergency, it may be helpful to wear a medical alert bracelet. This can help alert health care workers that you have a port.  · The port can stay in for as long as your caregiver believes it is necessary.  · When it is time for the port to come out, surgery will be done to remove it. The surgery will be similar to how the port was put in.  · If you are in the hospital or clinic:  · Your port will be taken care of and flushed by a nurse.  · If you are at home:  · A home health care nurse may give medicines and take care of the port.  · You or a family member can get special training and directions for giving medicine and taking care of the port at home.  SEEK IMMEDIATE MEDICAL CARE IF:   · Your port does not flush or you are unable to get a blood return.  · New drainage or pus is coming from the incision.  · A bad smell is coming from the incision site.  · You develop swelling or increased redness at the incision site.  · You develop increased swelling or pain at the port site.  · You develop swelling or pain in the surrounding skin near the port.  · You have an oral temperature above 102° F (38.9° C), not controlled by medicine.  MAKE SURE YOU:   · Understand these instructions.  · Will watch your condition.  · Will get help right away if you are not doing well or get worse.  Document Released: 07/04/2005 Document Revised: 09/26/2011 Document Reviewed: 09/25/2008  [employer]® Patient Information ©2014 [employer].

## 2013-07-16 LAB — URINE CULTURE

## 2013-07-29 ENCOUNTER — Other Ambulatory Visit: Payer: Self-pay | Admitting: Medical Oncology

## 2013-07-29 DIAGNOSIS — C189 Malignant neoplasm of colon, unspecified: Secondary | ICD-10-CM

## 2013-07-29 MED ORDER — LISINOPRIL 40 MG PO TABS: 40.0000 mg | ORAL_TABLET | Freq: Every day | ORAL | Status: DC

## 2013-07-29 MED ORDER — GABAPENTIN 300 MG PO CAPS: 300.0000 mg | ORAL_CAPSULE | Freq: Every day | ORAL | Status: DC

## 2013-08-08 ENCOUNTER — Ambulatory Visit (HOSPITAL_COMMUNITY)
Admission: RE | Admit: 2013-08-08 | Discharge: 2013-08-08 | Disposition: A | Discharge status: Home / Self Care | Payer: Medicare HMO | Source: Ambulatory Visit | Attending: Internal Medicine | Admitting: Internal Medicine

## 2013-08-08 DIAGNOSIS — Z139 Encounter for screening, unspecified: Secondary | ICD-10-CM

## 2013-08-08 DIAGNOSIS — Z1231 Encounter for screening mammogram for malignant neoplasm of breast: Secondary | ICD-10-CM | POA: Insufficient documentation

## 2013-08-12 ENCOUNTER — Encounter: Payer: Self-pay | Admitting: Internal Medicine

## 2013-09-12 ENCOUNTER — Telehealth: Payer: Self-pay | Admitting: Internal Medicine

## 2013-09-12 ENCOUNTER — Other Ambulatory Visit: Payer: Self-pay | Admitting: Radiation Therapy

## 2013-09-12 DIAGNOSIS — C7931 Secondary malignant neoplasm of brain: Secondary | ICD-10-CM

## 2013-09-12 DIAGNOSIS — C7949 Secondary malignant neoplasm of other parts of nervous system: Principal | ICD-10-CM

## 2013-09-12 NOTE — Telephone Encounter (Signed)
pt called to r/s flush due to weather....pt aware of new d.t

## 2013-09-17 ENCOUNTER — Ambulatory Visit (HOSPITAL_BASED_OUTPATIENT_CLINIC_OR_DEPARTMENT_OTHER): Payer: Medicare HMO

## 2013-09-17 VITALS — BP 134/78 | HR 100 | Temp 98.1°F

## 2013-09-17 DIAGNOSIS — Z95828 Presence of other vascular implants and grafts: Secondary | ICD-10-CM

## 2013-09-17 DIAGNOSIS — Z452 Encounter for adjustment and management of vascular access device: Secondary | ICD-10-CM

## 2013-09-17 DIAGNOSIS — C186 Malignant neoplasm of descending colon: Secondary | ICD-10-CM

## 2013-09-17 MED ORDER — HEPARIN SOD (PORK) LOCK FLUSH 100 UNIT/ML IV SOLN
500.0000 [IU] | Freq: Once | INTRAVENOUS | Status: AC
  Administered 2013-09-17: 500 [IU] via INTRAVENOUS
  Filled 2013-09-17: qty 5

## 2013-09-17 MED ORDER — SODIUM CHLORIDE 0.9 % IJ SOLN
10.0000 mL | INTRAMUSCULAR | Status: DC | PRN
  Administered 2013-09-17: 10 mL via INTRAVENOUS
  Filled 2013-09-17: qty 10

## 2013-09-26 ENCOUNTER — Ambulatory Visit (INDEPENDENT_AMBULATORY_CARE_PROVIDER_SITE_OTHER): Payer: Medicare HMO | Admitting: Internal Medicine

## 2013-09-26 ENCOUNTER — Encounter: Payer: Self-pay | Admitting: Internal Medicine

## 2013-09-26 VITALS — BP 130/80 | HR 88 | Ht 63.0 in | Wt 193.6 lb

## 2013-09-26 DIAGNOSIS — Z85038 Personal history of other malignant neoplasm of large intestine: Secondary | ICD-10-CM

## 2013-09-26 MED ORDER — MOVIPREP 100 G PO SOLR: 1.0000 | Freq: Once | ORAL | Status: DC

## 2013-09-26 NOTE — Patient Instructions (Signed)

## 2013-09-26 NOTE — Progress Notes (Signed)
         Subjective:    Patient ID: Kayla [name], female    DOB: 1951/07/13, 63 y.o.   MRN: [id number]  HPI The patient is a very pleasant middle-aged [place] American woman with a history of stage IV colon cancer. She has had long and brain metastases but is in clinical remission after multiple modalities of therapy as outlined in the prior oncology notes.  She has no GI symptoms at this time, her last colonoscopy was in 2012.  Medications, allergies, past medical history, past surgical history, family history and social history are reviewed and updated in the EMR.  Review of Systems     Objective:   Physical Exam Well-developed well-nourished obese no acute distress       Assessment & Plan:

## 2013-09-26 NOTE — Assessment & Plan Note (Signed)
As best I can tell this point she is in remission. So will proceed with a standard routine surveillance colonoscopy approximately 3 years from her last. The risks and benefits as well as alternatives of endoscopic procedure(s) have been discussed and reviewed. All questions answered. The patient agrees to proceed.

## 2013-10-03 ENCOUNTER — Encounter: Payer: Self-pay | Admitting: Internal Medicine

## 2013-10-28 ENCOUNTER — Ambulatory Visit: Payer: Medicare Other | Admitting: Radiation Oncology

## 2013-11-01 ENCOUNTER — Ambulatory Visit (HOSPITAL_COMMUNITY)
Admission: RE | Admit: 2013-11-01 | Discharge: 2013-11-01 | Disposition: A | Discharge status: Home / Self Care | Payer: Medicare HMO | Source: Ambulatory Visit | Attending: Physician Assistant | Admitting: Physician Assistant

## 2013-11-01 ENCOUNTER — Other Ambulatory Visit: Payer: Medicare Other

## 2013-11-01 ENCOUNTER — Ambulatory Visit
Admission: RE | Admit: 2013-11-01 | Discharge: 2013-11-01 | Disposition: A | Discharge status: Home / Self Care | Payer: Medicare HMO | Source: Ambulatory Visit | Attending: Radiation Oncology | Admitting: Radiation Oncology

## 2013-11-01 ENCOUNTER — Encounter (HOSPITAL_COMMUNITY): Payer: Self-pay

## 2013-11-01 ENCOUNTER — Other Ambulatory Visit (HOSPITAL_BASED_OUTPATIENT_CLINIC_OR_DEPARTMENT_OTHER): Payer: Medicare HMO

## 2013-11-01 DIAGNOSIS — Z9049 Acquired absence of other specified parts of digestive tract: Secondary | ICD-10-CM | POA: Insufficient documentation

## 2013-11-01 DIAGNOSIS — C186 Malignant neoplasm of descending colon: Secondary | ICD-10-CM

## 2013-11-01 DIAGNOSIS — R599 Enlarged lymph nodes, unspecified: Secondary | ICD-10-CM | POA: Insufficient documentation

## 2013-11-01 DIAGNOSIS — C7931 Secondary malignant neoplasm of brain: Secondary | ICD-10-CM

## 2013-11-01 DIAGNOSIS — C7949 Secondary malignant neoplasm of other parts of nervous system: Principal | ICD-10-CM

## 2013-11-01 DIAGNOSIS — C189 Malignant neoplasm of colon, unspecified: Secondary | ICD-10-CM

## 2013-11-01 DIAGNOSIS — I319 Disease of pericardium, unspecified: Secondary | ICD-10-CM | POA: Insufficient documentation

## 2013-11-01 DIAGNOSIS — D739 Disease of spleen, unspecified: Secondary | ICD-10-CM | POA: Insufficient documentation

## 2013-11-01 LAB — CBC WITH DIFFERENTIAL/PLATELET
BASO%: 0.3 % (ref 0.0–2.0)
Basophils Absolute: 0 10*3/uL (ref 0.0–0.1)
EOS%: 2.6 % (ref 0.0–7.0)
Eosinophils Absolute: 0.2 10*3/uL (ref 0.0–0.5)
HEMATOCRIT: 44.3 % (ref 34.8–46.6)
HGB: 14.6 g/dL (ref 11.6–15.9)
LYMPH#: 2.2 10*3/uL (ref 0.9–3.3)
LYMPH%: 23.6 % (ref 14.0–49.7)
MCH: 28.1 pg (ref 25.1–34.0)
MCHC: 33 g/dL (ref 31.5–36.0)
MCV: 85.4 fL (ref 79.5–101.0)
MONO#: 0.7 10*3/uL (ref 0.1–0.9)
MONO%: 7 % (ref 0.0–14.0)
NEUT%: 66.5 % (ref 38.4–76.8)
NEUTROS ABS: 6.2 10*3/uL (ref 1.5–6.5)
Platelets: 206 10*3/uL (ref 145–400)
RBC: 5.19 10*6/uL (ref 3.70–5.45)
RDW: 14.5 % (ref 11.2–14.5)
WBC: 9.4 10*3/uL (ref 3.9–10.3)

## 2013-11-01 LAB — COMPREHENSIVE METABOLIC PANEL (CC13)
ALBUMIN: 3.9 g/dL (ref 3.5–5.0)
ALT: 28 U/L (ref 0–55)
ANION GAP: 11 meq/L (ref 3–11)
AST: 31 U/L (ref 5–34)
Alkaline Phosphatase: 127 U/L (ref 40–150)
BUN: 10.5 mg/dL (ref 7.0–26.0)
CALCIUM: 9.9 mg/dL (ref 8.4–10.4)
CO2: 26 meq/L (ref 22–29)
Chloride: 105 mEq/L (ref 98–109)
Creatinine: 0.8 mg/dL (ref 0.6–1.1)
Glucose: 97 mg/dl (ref 70–140)
POTASSIUM: 4.5 meq/L (ref 3.5–5.1)
SODIUM: 142 meq/L (ref 136–145)
TOTAL PROTEIN: 7.9 g/dL (ref 6.4–8.3)
Total Bilirubin: 0.63 mg/dL (ref 0.20–1.20)

## 2013-11-01 LAB — LACTATE DEHYDROGENASE (CC13): LDH: 165 U/L (ref 125–245)

## 2013-11-01 LAB — CEA: CEA: 1.4 ng/mL (ref 0.0–5.0)

## 2013-11-01 MED ORDER — GADOBENATE DIMEGLUMINE 529 MG/ML IV SOLN
18.0000 mL | Freq: Once | INTRAVENOUS | Status: AC | PRN
  Administered 2013-11-01: 18 mL via INTRAVENOUS

## 2013-11-01 MED ORDER — IOHEXOL 300 MG/ML  SOLN
100.0000 mL | Freq: Once | INTRAMUSCULAR | Status: AC | PRN
  Administered 2013-11-01: 100 mL via INTRAVENOUS

## 2013-11-04 ENCOUNTER — Ambulatory Visit
Admission: RE | Admit: 2013-11-04 | Discharge: 2013-11-04 | Disposition: A | Discharge status: Home / Self Care | Payer: Medicare Other | Source: Ambulatory Visit | Attending: Radiation Oncology | Admitting: Radiation Oncology

## 2013-11-04 ENCOUNTER — Other Ambulatory Visit: Payer: Self-pay

## 2013-11-04 ENCOUNTER — Telehealth: Payer: Self-pay | Admitting: Internal Medicine

## 2013-11-04 ENCOUNTER — Encounter: Payer: Self-pay | Admitting: Radiation Oncology

## 2013-11-04 ENCOUNTER — Ambulatory Visit (HOSPITAL_BASED_OUTPATIENT_CLINIC_OR_DEPARTMENT_OTHER): Payer: Medicare HMO | Admitting: Internal Medicine

## 2013-11-04 VITALS — BP 145/78 | HR 81 | Resp 16 | Wt 192.1 lb

## 2013-11-04 VITALS — BP 146/90 | HR 72 | Temp 98.1°F | Resp 18 | Ht 63.0 in | Wt 191.2 lb

## 2013-11-04 DIAGNOSIS — Z452 Encounter for adjustment and management of vascular access device: Secondary | ICD-10-CM

## 2013-11-04 DIAGNOSIS — C186 Malignant neoplasm of descending colon: Secondary | ICD-10-CM

## 2013-11-04 DIAGNOSIS — C7949 Secondary malignant neoplasm of other parts of nervous system: Secondary | ICD-10-CM

## 2013-11-04 DIAGNOSIS — C7931 Secondary malignant neoplasm of brain: Secondary | ICD-10-CM

## 2013-11-04 DIAGNOSIS — C189 Malignant neoplasm of colon, unspecified: Secondary | ICD-10-CM

## 2013-11-04 DIAGNOSIS — C7800 Secondary malignant neoplasm of unspecified lung: Secondary | ICD-10-CM

## 2013-11-04 MED ORDER — HEPARIN SOD (PORK) LOCK FLUSH 100 UNIT/ML IV SOLN
500.0000 [IU] | Freq: Once | INTRAVENOUS | Status: AC
  Administered 2013-11-04: 500 [IU] via INTRAVENOUS
  Filled 2013-11-04: qty 5

## 2013-11-04 MED ORDER — SODIUM CHLORIDE 0.9 % IJ SOLN
10.0000 mL | INTRAMUSCULAR | Status: DC | PRN
  Administered 2013-11-04: 10 mL via INTRAVENOUS
  Filled 2013-11-04: qty 10

## 2013-11-04 NOTE — Progress Notes (Signed)
Hematology and Oncology Follow Up Visit  Kayla [name] [id number] 1950/09/25 63 y.o. 11/04/2013 11:01 AM PCP: [facility] [phone])  Principle Diagnosis: Colon cancer, T4N1M1, Stage IV  Prior Therapy:  Kayla [name] underwent surgical resection of her tumor with a colostomy at the time of her surgery on 10/27/2008. The colostomy has been subsequently reversed on 09/20/2010. [place] received chemotherapy consisting of FOLFOX for 10 treatments from 12/10/2008 through 06/02/2009. She achieved a partial remission from these treatments. She then received additional chemotherapy with 5-FU, leucovorin, and 5-FU by continuous infusion along with Avastin from 06/23/2009 through 11/17/2009. 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   Current therapy:  None  Interim History:  She was seen today for followup of her metastatic colon cancer with recurrence in the left parietal region of the brain.   She was last seen by me on 03/25/2014.  Her condition remains stable and without evidence for recurrence of disease.  Her main complaints are paresthesias in her feet related to the oxaliplatin. She states the right is worst than the left.  She denies any recent falls. She denies any headaches, any obvious weakness or residual effects from her brain tumor and its surgery.  She is not driving.  She denies any recent hospitalizations and reports continued compliance to her medicines.  She saw Dr. Tammi Klippel for follow-up this morning.   She had her mammogram this past January.    Medications: I have reviewed the patient's current medications.  Current Outpatient Prescriptions  Medication Sig Dispense Refill  . acetaminophen (TYLENOL) 500 MG tablet Take 1,000 mg by mouth every 6 (six) hours as needed.      . gabapentin (NEURONTIN) 300 MG capsule Take 1 capsule (300 mg total) by mouth at bedtime.  90 capsule  1  . ibuprofen (ADVIL,MOTRIN) 200 MG tablet Take 200 mg by  mouth every 8 (eight) hours as needed. For pain      . lisinopril (PRINIVIL,ZESTRIL) 40 MG tablet Take 1 tablet (40 mg total) by mouth daily.  30 tablet  3  . MOVIPREP 100 G SOLR Take 1 kit (200 g total) by mouth once.  1 kit  0   Current Facility-Administered Medications  Medication Dose Route Frequency Provider Last Rate Last Dose  . sodium chloride 0.9 % injection 10 mL  10 mL Intravenous PRN Concha Norway, MD   10 mL at 11/04/13 1032   Allergies:  Allergies  Allergen Reactions  . Sulfonamide Derivatives Rash   Oncology History: Diagnosis of colon cancer dates back to 10/27/2008 when Kayla [name] presented with what turned out to be a bowel perforation through tumor. Stage at that time was T4 N1 M1 with pulmonary metastatic disease. The K-ras mutation was detected. Kayla [name] underwent surgical resection of her tumor with a colostomy at the time of her surgery on 10/27/2008. The colostomy has been subsequently reversed on 09/20/2010. [name] received chemotherapy consisting of FOLFOX for 10 treatments from 12/10/2008 through 06/02/2009. She achieved a partial remission from these treatments. She then received additional chemotherapy with 5-FU, leucovorin, and 5-FU by continuous infusion along with Avastin from 06/23/2009 through 11/17/2009. 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 She was doing well without evidence of disease until she developed a right hemiparesthesia in September 2012 and was found to have a 3 x 3 x 2.4 cm, lobulated, enhancing mass in the left parietal lobe with marked surrounding edema. A PET scan on 03/25/2011 showed resolution of the  previously identified pulmonary nodules with no residual hypermetabolic activity. The pericardial effusion, which has been present since diagnosis, was unchanged. There were also uterine fibroids and a low-density lesion in the anterior spleen that was stable and not associated with any hypermetabolic activity. Of note,  Kayla [name] had a markedly elevated CEA up to 24.7 on 03/23/2011. On 06/23/2011, the CEA was less than 0.5. Dr. Erline Levine resected the recurrent metastatic colon cancer on 03/31/2011. This was followed by stereotactic radiation on 04/15/2011. The patient underwent a left-sided craniotomy and excision of the mass involving her left parietal lobe on 11/10/2011 by Dr. Erline Levine. The pathology report was negative for any malignancy. Pathology report indicated benign brain with fibrosis, hemorrhage, hemosiderin deposition, abundant dystrophic calcifications, and mixed acute and chronic inflammation including foreign body multinucleated giant cells.  y.   Past Medical History, Surgical history, Social history, and Family History were reviewed and updated.  SMOKING HISTORY:  The patient has smoked intermittently, about 2 packs of cigarettes per week, since she was a teenager, but stopped smoking in 2010.  Review of Systems: Constitutional:  Negative for fever, chills, night sweats, anorexia, weight loss, pain. Cardiovascular: no chest pain or dyspnea on exertion Respiratory: no cough, shortness of breath, or wheezing Neurological: no TIA or stroke symptoms Dermatological: negative for rash ENT: negative for - epistaxis, headaches, tinnitus, vertigo or visual changes Skin: Negative. Gastrointestinal: no abdominal pain, change in bowel habits, or black or bloody stools positive for - heartburn Genito-Urinary: no dysuria, trouble voiding, or hematuria Hematological and Lymphatic: negative for - bleeding problems, fatigue, jaundice or pallor Breast: negative for breast lumps Musculoskeletal: positive for - muscular weakness and pins and needles with some numbness (R>L) in lower extremities negative for - gait disturbance Remaining ROS negative. Physical Exam: Blood pressure 146/90, pulse 72, temperature 98.1 F (36.7 C), temperature source Oral, resp. rate 18, height _0  (1.6 m), weight 191 lb 3.2  oz (86.728 kg). ECOG: 0/1 General appearance: alert, cooperative, appears stated age, no distress and mildly obese Head: Normocephalic, without obvious abnormality, atraumatic Neck: no adenopathy, supple, symmetrical, trachea midline and thyroid not enlarged, symmetric, no tenderness/mass/nodules HEENT: PERRLA; EOMi; No sclerae icterus. OP clear of masses.  Lymph nodes: Cervical, supraclavicular, and axillary nodes normal. Heart:regular rate and rhythm, S1, S2 normal, no murmur, click, rub or gallop Lung:chest clear, no wheezing, rales, normal symmetric air entry,  + R sided port-a-cath. Abdomin: soft, non-tender, without masses or organomegaly, normal bowel sounds and surgical scars well healed. EXT:No peripheral edema.  R>L decreased sensation and mildly decreased strength (noted in prior exams) Neuro: R lower extremity weakness and decreased sensation.  Normal gait.  Good finger to nose bilaterally. Otherwise no focal deficits.   Lab Results: Lab Results  Component Value Date   WBC 9.4 11/01/2013   HGB 14.6 11/01/2013   HCT 44.3 11/01/2013   MCV 85.4 11/01/2013   PLT 206 11/01/2013     Chemistry      Component Value Date/Time   NA 142 11/01/2013 0803   NA 140 03/01/2012 1112   NA 142 10/05/2010 1132   K 4.5 11/01/2013 0803   K 4.0 03/01/2012 1112   K 4.0 10/05/2010 1132   CL 108* 10/23/2012 0828   CL 105 03/01/2012 1112   CL 101 10/05/2010 1132   CO2 26 11/01/2013 0803   CO2 26 03/01/2012 1112   CO2 28 10/05/2010 1132   BUN 10.5 11/01/2013 0803   BUN 14 03/01/2012 1112  BUN 13 10/05/2010 1132   CREATININE 0.8 11/01/2013 0803   CREATININE 0.68 03/01/2012 1112   CREATININE 0.8 10/05/2010 1132      Component Value Date/Time   CALCIUM 9.9 11/01/2013 0803   CALCIUM 9.5 03/01/2012 1112   CALCIUM 9.4 10/05/2010 1132   ALKPHOS 127 11/01/2013 0803   ALKPHOS 117 03/01/2012 1112   ALKPHOS 184* 10/05/2010 1132   AST 31 11/01/2013 0803   AST 17 03/01/2012 1112   AST 28 10/05/2010 1132   ALT 28 11/01/2013  0803   ALT 13 03/01/2012 1112   ALT 26 10/05/2010 1132   BILITOT 0.63 11/01/2013 0803   BILITOT 0.9 03/01/2012 1112   BILITOT 0.60 10/05/2010 1132     Results for DONNETTA, GILLIN A (MRN [id number]) as of 11/04/2013 10:50  Ref. Range 07/15/2013 09:46 11/01/2013 08:02  CEA Latest Range: 0.0-5.0 ng/mL 1.1 1.4    Radiological Studies: 1. MRI of the head with and without IV contrast on 03/23/2011 showed a solitary enhancing mass in the left parietal lobe with surrounding white matter edema, most compatible with solitary metastatic deposit. The mass lesion measured 2.7 x 2.5 cm.  2. PET scan from 03/25/2011 showed resolution of the previously- identified pulmonary nodules with no residual hypermetabolic activity noted in the neck, chest, abdomen or pelvis to suggest active malignancy. There was a pericardial effusion and some subsegmental atelectasis in the lower lobes. There were also uterine fibroids and a low-density lesion in the anterior spleen that was stable and not associated with hypermetabolic activity.  3. MRI of the head with and without IV contrast showed Stealth protocol utilized to evaluate left parietal mass. The mass measured 3 x 3 x 2.4 cm with marked surrounding vasogenic edema.  4. MRI of the head with and without IV contrast on 04/08/2011 showed postsurgical changes of tumor removal in the left parietal lobe and a postsurgical hematoma with enhancement. There was 3 mm of midline shift. No other metastatic deposits.  5. MRI of the head with and without IV contrast on 06/28/2011 showed a 3 cm area of restricted diffusion lying within the previous area of  left parietal surgical resection for colon cancer. There was enhancement of the peripheral aspect of the cavity, marked restricted diffusion and increasing vasogenic edema. There was concern about the development of a brain abscess.  6. MRI of the head with and without IV contrast on 07/29/2011 showed left parietal postoperative hematoma was  smaller compared with the  prior study. There was no nodular enhancement seen to indicate tumor. No new enhancing lesions were seen. There was significant  improvement in the left parietal edema.  7. Digital screening mammogram was negative on 08/02/2011.  8. An MRI of the head with and without IV contrast on 10/21/2011 showed development of gyriform enhancement circumferentially along the margins of the left parietal postoperative space/hematoma. Marked increase in vasogenic edema was seen. 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 There was further contraction of the hematoma/postoperative space in the left parietal cortical region, now measuring 17 x 19 mm in transverse diameter as opposed to 22 x 21 mm previously.  Left-to-right shift was 2 mm.  9. Nuclear medicine brain PET-CT scan carried out on 10/27/2011 showed hypermetabolic activity in the high left parietal lobe which corresponds to gyriform enhancement on comparison MRI from 10/21/2011. This was felt to be most consistent with recurrent  tumor.  10. Chest x-ray, 2 view from 11/04/2011, showed  chronic cardiomegaly. Port-A-Cath was in place. No acute disease was present.  11. MRI of the head with and without IV contrast on 01/27/2012 showed findings that were felt to be consistent with progression of disease. This exam was compared with the MRI of 10/21/2011. It was recognized that the patient underwent surgical resection of the mass on 11/10/2011.  There was felt to be on the present exam residual peripheral enhancement of approximately 35 x 40 x 21 mm with infiltration of the cortex and deep white matter. It was felt that the enhancement had progressed, despite removal of the central focus. There was moderate vasogenic  edema throughout the left hemisphere but slightly decreased from the MRI from 10/21/2011. No new lesions were seen. There was mild atrophy with chronic microvascular  ischemic change, but no midline shift. Major intracranial vascular structures were patent.  12. MRI of the brain with and without IV contrast on 05/04/2012 showed previous left parietal lobe surgery for resection of tumor and radiation necrosis.  No new abnormalities were noted. 13. Chest x-ray, 2 view, on 07/03/2012 showed enlargement of the cardiac silhouette with some lingular scarring, as compared with the chest x-ray from 11/04/2011. 14. 2-D echocardiogram from 07/12/2012 showed left ventricular ejection fraction of 55-60%.  There was a small to moderate circumferential pericardial effusion with no signs of tamponade.  There was felt to be no significant change from the 2-D echocardiogram dated 08/13/2009. 15. Digital bilateral screening mammogram on 08/06/2012 was negative. 16. 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  There was no progression or new brain metastasis identified. 17. PET scan from 10/16/2012 showed no specific features to suggest metastatic disease.  There was diffuse uptake throughout the thyroid gland, possibly due to hyperthyroidism. 18. MRI of the brain with and without IV contrast on 03/12/2013 showed residual gyriform enhancement surrounding the area of previous surgical resection of a left parietal metastasis with subsequent resection of radionecrosis. The findings likely represent some residual brain injury, but there is no significant progression of radionecrosis and no new lesions seen. 19. MRI of the brain with and without IV contrast on 11/01/2013 showed Stable posttherapy appearance of the brain since May of 2014. No progression or new brain metastasis identified. 20. CT chest,abomen and pelvis with contrast. 1. No evidence of thoracic metastasis.  2. Small prevascular lymph node is similar prior. 3. Stable pericardial  effusion. 4. Interval increase in the volume presacral lymph node in the upper pelvis. This has progressed slowly from 2011. Recommend attention on follow-up versus restaging FDG PET scan. 5. Stable anastomosis in the descending colon.  Impression and Plan: 1. Stage IV Colon Cancer mets to lung/brain.  --Rayssa continues to do well with no evidence for disease recurrence.  She is now out nearly 5 years from the time of diagnosis and nearly 2.5 years from the time of diagnosis of her right brain recurrence.  Clinically, she is doing well.  Her CEA in April was 1.4.  We will await CEA from today. Will continue to flush the Port-A-Cath every 2 months with heparin.   -- Linda will return to the clinic again in 3 months, at which time we will check CBC, chemistries, LDH, and CEA and CT of abdomen and pelvis as noted above to follow interval increase in presacral lymph node in the upper pelvis.  She is having repeat MRIs of  the brain ordered by Dr. Tammi Klippel about every 3-4 months.  All questions were answered. The patient knows to call the clinic with any problems, questions or concerns. We can certainly see the patient much sooner if necessary.   I spent 15 minutes counseling the patient face to face. The total time spent in the appointment was 25 minutes.   Concha Norway, MD 4/20/201511:01 AM

## 2013-11-04 NOTE — Progress Notes (Signed)
Radiation Oncology         [phone]) [phone] ________________________________  Name: Kayla [name] MRN: [id number]  Date: 11/04/2013  DOB: January 28, 1951  Follow-Up Visit Note  CC: No PCP Per Patient  No ref. 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  Scheduled for colonoscopy on 5/6. Reports occasional temporal tension relieved by shifting head left to right. Denies regular headache, dizziness, nausea, vomiting, or diplopia. Reports occasional ringing in the ears. Steady gait noted. Pleasant affect noted. Denies pain today                             ALLERGIES:  is allergic to sulfonamide derivatives.  Meds: Current Outpatient Prescriptions  Medication Sig Dispense Refill  . acetaminophen (TYLENOL) 500 MG tablet Take 1,000 mg by mouth every 6 (six) hours as needed.      . gabapentin (NEURONTIN) 300 MG capsule Take 1 capsule (300 mg total) by mouth at bedtime.  90 capsule  1  . ibuprofen (ADVIL,MOTRIN) 200 MG tablet Take 200 mg by mouth every 8 (eight) hours as needed. For pain      . lisinopril (PRINIVIL,ZESTRIL) 40 MG tablet Take 1 tablet (40 mg total) by mouth daily.  30 tablet  3  . MOVIPREP 100 G SOLR Take 1 kit (200 g total) by mouth once.  1 kit  0   No current facility-administered medications for this encounter.    Physical Findings: The patient is in no acute distress. Patient is alert and oriented.  weight is 192 lb 1.6 oz (87.136 kg). Her blood pressure is 145/78 and her pulse is 81. Her respiration is 16. .  No significant changes.  Lab Findings: Lab Results  Component Value Date   WBC 9.4 11/01/2013   HGB 14.6 11/01/2013   HCT 44.3 11/01/2013   MCV 85.4 11/01/2013   PLT 206 11/01/2013    @LASTCHEM @  Radiographic Findings: Ct Chest W Contrast  11/01/2013    CLINICAL DATA:  Restaging colon cancer. Chemotherapy complete. Partial colectomy.  EXAM: CT CHEST, ABDOMEN, AND PELVIS WITH CONTRAST  TECHNIQUE: Multidetector CT imaging of the chest, abdomen and pelvis was performed following the standard protocol during bolus administration of intravenous contrast.  CONTRAST:  184m OMNIPAQUE IOHEXOL 300 MG/ML  SOLN  FINDINGS: CT CHEST FINDINGS  There is a port in the right medial chest wall. No axillary or supraclavicular lymphadenopathy. 6 mm prevascular lymph node (image 19, series 2) compares to 4 mm on prior. No mediastinal lymphadenopathy by size criteria. There is a small pericardial effusion unchanged from prior. Esophagus is normal. Review of the lung parenchyma demonstrates no pulmonary nodules. Central airways are normal.  CT ABDOMEN AND PELVIS FINDINGS  No focal hepatic lesion. The gallbladder, pancreas, adrenal glands, and kidneys are normal.  There is a low-density lesion within the anterior margin of the spleen which is unchanged measuring 15 mm.  Stomach, small bowel, appendix, cecum are normal. There is a colonic colonic anastomosis lung descending colon. No evidence of nodularity at the anastomosis. Rectosigmoid colon is normal.  Abdominal aorta is normal caliber. No retroperitoneal periportal lymphadenopathy.  There is an enlarged height presacral lymph node within the inferior abdominal mesentery measuring 13 mm (image 87,  series 2) which is increased in size from 9 mm on head CT of 10/16/2012 and 7 mm on CT of 04/09/2010. No Additional evidence of pelvic lymphadenopathy.  The uterus is lobular similar to prior consistent leiomyomas. Ovaries are normal. Bladder is normal. No aggressive osseous lesion.  IMPRESSION: 1. No evidence of thoracic metastasis. 2. Small prevascular lymph node is similar prior. 3. Stable pericardial effusion. 4. Interval increase in the volume presacral lymph node in the upper pelvis. This has progressed slowly from 2011. Recommend  attention on follow-up versus restaging FDG PET scan. 5. Stable anastomosis in the descending colon.   Electronically Signed   By: Suzy Bouchard M.D.   On: 11/01/2013 09:56   Mr [name] VW Contrast  11/01/2013   CLINICAL DATA:  63 year old female with history of brain metastasis status post resection and radiation treatment. 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 Restaging. Subsequent encounter.  EXAM: MRI HEAD WITHOUT AND WITH CONTRAST  TECHNIQUE: Multiplanar, multiecho pulse sequences of the brain and surrounding structures were obtained without and with intravenous contrast.  CONTRAST:  27m MULTIHANCE GADOBENATE DIMEGLUMINE 529 MG/ML IV SOLN  COMPARISON:  06/21/2013 and earlier.  FINDINGS: Postoperative changes in the left parietal lobe with underlying chronic increased white matter T2 signal, stable since 03/12/2013. Postcontrast enhancement here also is unchanged since that time, and as before has a mixed gyriform and stellate configuration.  No abnormal enhancement elsewhere.  Stable superimposed scattered bilaterally nonspecific cerebral white matter T2 and FLAIR hyperintensity.  No restricted diffusion or evidence of acute infarction. No ventriculomegaly. No intracranial mass effect. Major intracranial vascular flow voids are stable. No acute intracranial hemorrhage identified. Negative pituitary, cervicomedullary junction and visualized cervical spine. Stable bone marrow signal. Visualized orbit soft tissues are within normal limits. Visible internal auditory structures appear normal. Visualized paranasal sinuses and mastoids are clear. Stable scalp soft tissues.  IMPRESSION: Stable posttherapy appearance of the brain since May of 2014. No progression or new brain metastasis identified.   Electronically Signed   By: LLars PinksM.D.   On: 11/01/2013 17:55   Ct Abdomen Pelvis W Contrast  11/01/2013   CLINICAL DATA:  Restaging colon cancer. Chemotherapy complete. Partial colectomy.   EXAM: CT CHEST, ABDOMEN, AND PELVIS WITH CONTRAST  TECHNIQUE: Multidetector CT imaging of the chest, abdomen and pelvis was performed following the standard protocol during bolus administration of intravenous contrast.  CONTRAST:  1052mOMNIPAQUE IOHEXOL 300 MG/ML  SOLN  FINDINGS: CT CHEST FINDINGS  There is a port in the right medial chest wall. No axillary or supraclavicular lymphadenopathy. 6 mm prevascular lymph node (image 19, series 2) compares to 4 mm on prior. No mediastinal lymphadenopathy by size criteria. There is a small pericardial effusion unchanged from prior. Esophagus is normal. Review of the lung parenchyma demonstrates no pulmonary nodules. Central airways are normal.  CT ABDOMEN AND PELVIS FINDINGS  No focal hepatic lesion. The gallbladder, pancreas, adrenal glands, and kidneys are normal.  There is a low-density lesion within the anterior margin of the spleen which is unchanged measuring 15 mm.  Stomach, small bowel, appendix, cecum are normal. There is a colonic colonic anastomosis lung descending colon. No evidence of nodularity at the anastomosis. Rectosigmoid colon is normal.  Abdominal aorta is normal caliber. No retroperitoneal periportal lymphadenopathy.  There is an enlarged height presacral lymph node within the inferior abdominal mesentery measuring 13 mm (image 87, series 2) which is increased in size from 9 mm on head CT of 10/16/2012 and  7 mm on CT of 04/09/2010. No Additional evidence of pelvic lymphadenopathy.  The uterus is lobular similar to prior consistent leiomyomas. Ovaries are normal. Bladder is normal. No aggressive osseous lesion.  IMPRESSION: 1. No evidence of thoracic metastasis. 2. Small prevascular lymph node is similar prior. 3. Stable pericardial effusion. 4. Interval increase in the volume presacral lymph node in the upper pelvis. This has progressed slowly from 2011. Recommend attention on follow-up versus restaging FDG PET scan. 5. Stable anastomosis in the  descending colon.   Electronically Signed   By: Suzy Bouchard M.D.   On: 11/01/2013 09:56    Impression:  The patient is without evidence of recurrence  Plan:  MRI in 4 months. Then follow-up.  _____________________________________  Sheral Apley. Tammi Klippel, M.D.

## 2013-11-04 NOTE — Progress Notes (Signed)
Scheduled for colonoscopy on 5/6. Reports occasional temporal tension relieved by shifting head left to right. Denies regular headache, dizziness, nausea, vomiting, or diplopia. Reports occasional ringing in the ears. Steady gait noted. Pleasant affect noted. Denies pain today.

## 2013-11-04 NOTE — Telephone Encounter (Signed)
gv adn printed appt sched and avs for opt for July....gv pt barium

## 2013-11-20 ENCOUNTER — Encounter: Payer: Self-pay | Admitting: Internal Medicine

## 2013-11-20 ENCOUNTER — Ambulatory Visit (AMBULATORY_SURGERY_CENTER): Payer: Medicare HMO | Admitting: Internal Medicine

## 2013-11-20 VITALS — BP 128/89 | HR 77 | Temp 97.3°F | Resp 22 | Ht 63.0 in | Wt 193.0 lb

## 2013-11-20 DIAGNOSIS — D129 Benign neoplasm of anus and anal canal: Secondary | ICD-10-CM

## 2013-11-20 DIAGNOSIS — D128 Benign neoplasm of rectum: Secondary | ICD-10-CM

## 2013-11-20 DIAGNOSIS — D126 Benign neoplasm of colon, unspecified: Secondary | ICD-10-CM

## 2013-11-20 DIAGNOSIS — Z85038 Personal history of other malignant neoplasm of large intestine: Secondary | ICD-10-CM

## 2013-11-20 MED ORDER — SODIUM CHLORIDE 0.9 % IV SOLN: 500.0000 mL | INTRAVENOUS | Status: DC

## 2013-11-20 NOTE — Patient Instructions (Signed)
YOU HAD AN ENDOSCOPIC PROCEDURE TODAY AT THE [facility]: Refer to the procedure report that was given to you for any specific questions about what was found during the examination.  If the procedure report does not answer your questions, please call your gastroenterologist to clarify.  If you requested that your care partner not be given the details of your procedure findings, then the procedure report has been included in a sealed envelope for you to review at your convenience later.  YOU SHOULD EXPECT: Some feelings of bloating in the abdomen. Passage of more gas than usual.  Walking can help get rid of the air that was put into your GI tract during the procedure and reduce the bloating. If you had a lower endoscopy (such as a colonoscopy or flexible sigmoidoscopy) you may notice spotting of blood in your stool or on the toilet paper. If you underwent a bowel prep for your procedure, then you may not have a normal bowel movement for a few days.  DIET: Your first meal following the procedure should be a light meal and then it is ok to progress to your normal diet.  A half-sandwich or bowl of soup is an example of a good first meal.  Heavy or fried foods are harder to digest and may make you feel nauseous or bloated.  Likewise meals heavy in dairy and vegetables can cause extra gas to form and this can also increase the bloating.  Drink plenty of fluids but you should avoid alcoholic beverages for 24 hours.  ACTIVITY: Your care partner should take you home directly after the procedure.  You should plan to take it easy, moving slowly for the rest of the day.  You can resume normal activity the day after the procedure however you should NOT DRIVE or use heavy machinery for 24 hours (because of the sedation medicines used during the test).    SYMPTOMS TO REPORT IMMEDIATELY: A gastroenterologist can be reached at any hour.  During normal business hours, 8:30 AM to 5:00 PM Monday through Friday,  call [phone].  After hours and on weekends, please call the GI answering service at [phone] who will take a message and have the physician on call contact you.   Following lower endoscopy (colonoscopy or flexible sigmoidoscopy):  Excessive amounts of blood in the stool  Significant tenderness or worsening of abdominal pains  Swelling of the abdomen that is new, acute  Fever of 100F or higher  Following upper endoscopy (EGD)  Vomiting of blood or coffee ground material  New chest pain or pain under the shoulder blades  Painful or persistently difficult swallowing  New shortness of breath  Fever of 100F or higher  Black, tarry-looking stools  FOLLOW UP: If any biopsies were taken you will be contacted by phone or by letter within the next 1-3 weeks.  Call your gastroenterologist if you have not heard about the biopsies in 3 weeks.  Our staff will call the home number listed on your records the next business day following your procedure to check on you and address any questions or concerns that you may have at that time regarding the information given to you following your procedure. This is a courtesy call and so if there is no answer at the home number and we have not heard from you through the emergency physician on call, we will assume that you have returned to your regular daily activities without incident.  SIGNATURES/CONFIDENTIALITY: You and/or your care   partner have signed paperwork which will be entered into your electronic medical record.  These signatures attest to the fact that that the information above on your After Visit Summary has been reviewed and is understood.  Full responsibility of the confidentiality of this discharge information lies with you and/or your care-partner.YOU HAD AN ENDOSCOPIC PROCEDURE TODAY AT [facility]: Refer to the procedure report that was given to you for any specific questions about what was found during the examination.   If the procedure report does not answer your questions, please call your gastroenterologist to clarify.  If you requested that your care partner not be given the details of your procedure findings, then the procedure report has been included in a sealed envelope for you to review at your convenience later.  YOU SHOULD EXPECT: Some feelings of bloating in the abdomen. Passage of more gas than usual.  Walking can help get rid of the air that was put into your GI tract during the procedure and reduce the bloating. If you had a lower endoscopy (such as a colonoscopy or flexible sigmoidoscopy) you may notice spotting of blood in your stool or on the toilet paper. If you underwent a bowel prep for your procedure, then you may not have a normal bowel movement for a few days.  DIET: Your first meal following the procedure should be a light meal and then it is ok to progress to your normal diet.  A half-sandwich or bowl of soup is an example of a good first meal.  Heavy or fried foods are harder to digest and may make you feel nauseous or bloated.  Likewise meals heavy in dairy and vegetables can cause extra gas to form and this can also increase the bloating.  Drink plenty of fluids but you should avoid alcoholic beverages for 24 hours.  ACTIVITY: Your care partner should take you home directly after the procedure.  You should plan to take it easy, moving slowly for the rest of the day.  You can resume normal activity the day after the procedure however you should NOT DRIVE or use heavy machinery for 24 hours (because of the sedation medicines used during the test).    SYMPTOMS TO REPORT IMMEDIATELY: A gastroenterologist can be reached at any hour.  During normal business hours, 8:30 AM to 5:00 PM Monday through Friday, call [phone].  After hours and on weekends, please call the GI answering service at [phone] who will take a message and have the physician on call contact you.   Following lower  endoscopy (colonoscopy or flexible sigmoidoscopy):  Excessive amounts of blood in the stool  Significant tenderness or worsening of abdominal pains  Swelling of the abdomen that is new, acute  Fever of 100F or higher    FOLLOW UP: If any biopsies were taken you will be contacted by phone or by letter within the next 1-3 weeks.  Call your gastroenterologist if you have not heard about the biopsies in 3 weeks.  Our staff will call the home number listed on your records the next business day following your procedure to check on you and address any questions or concerns that you may have at that time regarding the information given to you following your procedure. This is a courtesy call and so if there is no answer at the home number and we have not heard from you through the emergency physician on call, we will assume that you have returned to your regular  daily activities without incident.  SIGNATURES/CONFIDENTIALITY: You and/or your care partner have signed paperwork which will be entered into your electronic medical record.  These signatures attest to the fact that that the information above on your After Visit Summary has been reviewed and is understood.  Full responsibility of the confidentiality of this discharge information lies with you and/or your care-partner.   Polyp information given.  Next app, 5 years 2015

## 2013-11-20 NOTE — Op Note (Signed)
[place]  [address] [place], [id number]   COLONOSCOPY PROCEDURE REPORT  PATIENT: Kayla [name], Kayla [name]  MR#: [id number] BIRTHDATE: May 06, 1951 , 62  yrs. old GENDER: Female ENDOSCOPIST: Gatha Mayer, MD, [facility] PROCEDURE DATE:  11/20/2013 PROCEDURE:   Colonoscopy with biopsy First Screening Colonoscopy - Avg.  risk and is 50 yrs.  old or older - No.  Prior Negative Screening - Now for repeat screening. N/A  History of Adenoma - Now for follow-up colonoscopy & has been > or = to 3 yrs.  Yes hx of adenoma.  Has been 3 or more years since last colonoscopy.  Polyps Removed Today? Yes. ASA CLASS:   Class III INDICATIONS:High risk patient with personal history of colon cancer.  MEDICATIONS: Propofol (Diprivan) 170 mg IV, MAC sedation, administered by CRNA, and These medications were titrated to patient response per physician's verbal order  DESCRIPTION OF PROCEDURE:   After the risks benefits and alternatives of the procedure were thoroughly explained, informed consent was obtained.  A digital rectal exam revealed no abnormalities of the rectum.   The LB [id number]  endoscope was introduced through the anus and advanced to the cecum, which was identified by both the appendix and ileocecal valve. No adverse events experienced.   The quality of the prep was excellent using Suprep  The instrument was then slowly withdrawn as the colon was fully examined.      COLON FINDINGS: A diminutive sessile polyp was found in the transverse colon.  A polypectomy was performed with cold forceps. The resection was complete and the polyp tissue was completely retrieved.   There was evidence of a prior colo-colonic surgical anastomosis The finding was in the left colon.   The colon mucosa was otherwise normal.  Retroflexed views revealed no abnormalities. The time to cecum=minutes 46 seconds.  Withdrawal time=7 minutes 30 seconds.  The scope was withdrawn and the procedure  completed. COMPLICATIONS: There were no complications.  ENDOSCOPIC IMPRESSION: 1.   Diminutive sessile polyp was found in the transverse colon; polypectomy was performed with cold forceps 2.   There was evidence of a prior colo-colonic surgical anastomosis in the left colon 3.   The colon mucosa was otherwise normal - excellent prep  RECOMMENDATIONS: Repeat Colonoscopy in 5 years.   eSigned:  Gatha Mayer, MD, [facility] 11/20/2013 2:34 PM   cc: The Patient

## 2013-11-20 NOTE — Progress Notes (Signed)
Report to pacu rn, vss, bbs=clear 

## 2013-11-20 NOTE — Progress Notes (Signed)
Called to room to assist during endoscopic procedure.  Patient ID and intended procedure confirmed with present staff. Received instructions for my participation in the procedure from the performing physician.  

## 2013-11-21 ENCOUNTER — Telehealth: Payer: Self-pay | Admitting: *Deleted

## 2013-11-21 NOTE — Telephone Encounter (Signed)
  Follow up Call-  Call back number 11/20/2013  Post procedure Call Back phone  # [phone]  Permission to leave phone message Yes     Patient questions:  Do you have a fever, pain , or abdominal swelling? no Pain Score  0 *  Have you tolerated food without any problems? yes  Have you been able to return to your normal activities? yes  Do you have any questions about your discharge instructions: Diet   no Medications  no Follow up visit  no  Do you have questions or concerns about your Care? no  Actions: * If pain score is 4 or above: No action needed, pain <4.

## 2013-11-25 ENCOUNTER — Encounter: Payer: Self-pay | Admitting: Internal Medicine

## 2013-11-25 NOTE — Progress Notes (Signed)
Quick Note:  Lymphoid polyp - repeat colonoscopy 5 years 2020 ______

## 2013-12-24 ENCOUNTER — Other Ambulatory Visit: Payer: Self-pay | Admitting: Internal Medicine

## 2014-01-23 ENCOUNTER — Other Ambulatory Visit: Payer: Self-pay | Admitting: Radiation Therapy

## 2014-01-23 DIAGNOSIS — C7931 Secondary malignant neoplasm of brain: Secondary | ICD-10-CM

## 2014-01-23 DIAGNOSIS — C7949 Secondary malignant neoplasm of other parts of nervous system: Principal | ICD-10-CM

## 2014-01-25 ENCOUNTER — Other Ambulatory Visit: Payer: Self-pay | Admitting: Internal Medicine

## 2014-01-30 ENCOUNTER — Ambulatory Visit (HOSPITAL_COMMUNITY)
Admission: RE | Admit: 2014-01-30 | Discharge: 2014-01-30 | Disposition: A | Discharge status: Home / Self Care | Payer: Medicare HMO | Source: Ambulatory Visit | Attending: Internal Medicine | Admitting: Internal Medicine

## 2014-01-30 ENCOUNTER — Other Ambulatory Visit: Payer: Self-pay | Admitting: Internal Medicine

## 2014-01-30 ENCOUNTER — Other Ambulatory Visit (HOSPITAL_BASED_OUTPATIENT_CLINIC_OR_DEPARTMENT_OTHER): Payer: Medicare HMO

## 2014-01-30 ENCOUNTER — Encounter (HOSPITAL_COMMUNITY): Payer: Self-pay

## 2014-01-30 DIAGNOSIS — C7931 Secondary malignant neoplasm of brain: Secondary | ICD-10-CM

## 2014-01-30 DIAGNOSIS — Z9221 Personal history of antineoplastic chemotherapy: Secondary | ICD-10-CM | POA: Insufficient documentation

## 2014-01-30 DIAGNOSIS — C189 Malignant neoplasm of colon, unspecified: Secondary | ICD-10-CM

## 2014-01-30 DIAGNOSIS — C186 Malignant neoplasm of descending colon: Secondary | ICD-10-CM

## 2014-01-30 DIAGNOSIS — R599 Enlarged lymph nodes, unspecified: Secondary | ICD-10-CM | POA: Insufficient documentation

## 2014-01-30 DIAGNOSIS — R911 Solitary pulmonary nodule: Secondary | ICD-10-CM | POA: Insufficient documentation

## 2014-01-30 DIAGNOSIS — D7389 Other diseases of spleen: Secondary | ICD-10-CM | POA: Insufficient documentation

## 2014-01-30 DIAGNOSIS — I319 Disease of pericardium, unspecified: Secondary | ICD-10-CM | POA: Insufficient documentation

## 2014-01-30 LAB — CBC WITH DIFFERENTIAL/PLATELET
BASO%: 1.4 % (ref 0.0–2.0)
Basophils Absolute: 0.1 10*3/uL (ref 0.0–0.1)
EOS ABS: 0.2 10*3/uL (ref 0.0–0.5)
EOS%: 2.3 % (ref 0.0–7.0)
HCT: 44 % (ref 34.8–46.6)
HGB: 14.2 g/dL (ref 11.6–15.9)
LYMPH#: 2.4 10*3/uL (ref 0.9–3.3)
LYMPH%: 26.4 % (ref 14.0–49.7)
MCH: 27.5 pg (ref 25.1–34.0)
MCHC: 32.2 g/dL (ref 31.5–36.0)
MCV: 85.4 fL (ref 79.5–101.0)
MONO#: 0.7 10*3/uL (ref 0.1–0.9)
MONO%: 7.2 % (ref 0.0–14.0)
NEUT%: 62.7 % (ref 38.4–76.8)
NEUTROS ABS: 5.8 10*3/uL (ref 1.5–6.5)
Platelets: 202 10*3/uL (ref 145–400)
RBC: 5.16 10*6/uL (ref 3.70–5.45)
RDW: 15.2 % — AB (ref 11.2–14.5)
WBC: 9.3 10*3/uL (ref 3.9–10.3)

## 2014-01-30 LAB — COMPREHENSIVE METABOLIC PANEL (CC13)
ALK PHOS: 122 U/L (ref 40–150)
ALT: 33 U/L (ref 0–55)
AST: 28 U/L (ref 5–34)
Albumin: 3.7 g/dL (ref 3.5–5.0)
Anion Gap: 10 mEq/L (ref 3–11)
BUN: 18.5 mg/dL (ref 7.0–26.0)
CHLORIDE: 108 meq/L (ref 98–109)
CO2: 25 mEq/L (ref 22–29)
Calcium: 9.8 mg/dL (ref 8.4–10.4)
Creatinine: 0.8 mg/dL (ref 0.6–1.1)
GLUCOSE: 93 mg/dL (ref 70–140)
POTASSIUM: 4.3 meq/L (ref 3.5–5.1)
SODIUM: 142 meq/L (ref 136–145)
TOTAL PROTEIN: 7.4 g/dL (ref 6.4–8.3)
Total Bilirubin: 1.07 mg/dL (ref 0.20–1.20)

## 2014-01-30 LAB — LACTATE DEHYDROGENASE (CC13): LDH: 148 U/L (ref 125–245)

## 2014-01-30 MED ORDER — IOHEXOL 300 MG/ML  SOLN
100.0000 mL | Freq: Once | INTRAMUSCULAR | Status: AC | PRN
  Administered 2014-01-30: 100 mL via INTRAVENOUS

## 2014-01-31 LAB — CEA: CEA: 1.3 ng/mL (ref 0.0–5.0)

## 2014-02-03 ENCOUNTER — Telehealth: Payer: Self-pay | Admitting: Internal Medicine

## 2014-02-03 ENCOUNTER — Encounter: Payer: Self-pay | Admitting: Internal Medicine

## 2014-02-03 ENCOUNTER — Ambulatory Visit (HOSPITAL_BASED_OUTPATIENT_CLINIC_OR_DEPARTMENT_OTHER): Payer: Medicare HMO | Admitting: Internal Medicine

## 2014-02-03 VITALS — BP 116/54 | HR 82 | Temp 98.5°F | Resp 18 | Ht 63.0 in | Wt 186.7 lb

## 2014-02-03 DIAGNOSIS — C7931 Secondary malignant neoplasm of brain: Secondary | ICD-10-CM

## 2014-02-03 DIAGNOSIS — C186 Malignant neoplasm of descending colon: Secondary | ICD-10-CM

## 2014-02-03 DIAGNOSIS — C7949 Secondary malignant neoplasm of other parts of nervous system: Secondary | ICD-10-CM

## 2014-02-03 DIAGNOSIS — C7800 Secondary malignant neoplasm of unspecified lung: Secondary | ICD-10-CM

## 2014-02-03 MED ORDER — SODIUM CHLORIDE 0.9 % IJ SOLN
10.0000 mL | INTRAMUSCULAR | Status: DC | PRN
  Administered 2014-02-03: 10 mL via INTRAVENOUS
  Filled 2014-02-03: qty 10

## 2014-02-03 MED ORDER — HEPARIN SOD (PORK) LOCK FLUSH 100 UNIT/ML IV SOLN
500.0000 [IU] | Freq: Once | INTRAVENOUS | Status: AC
  Administered 2014-02-03: 500 [IU] via INTRAVENOUS
  Filled 2014-02-03: qty 5

## 2014-02-03 NOTE — Telephone Encounter (Signed)
gv adn rpinted aptp sched for pt for Aug thru [facility]

## 2014-02-03 NOTE — Progress Notes (Signed)
Hematology and Oncology Follow Up Visit  Kayla [name] [id number] 05-Aug-1950 63 y.o. 02/03/2014 11:36 AM PCP: [facility] [phone])  Principle Diagnosis: Colon cancer, T4N1M1, Stage IV  Prior Therapy:  Kayla [name] underwent surgical resection of her tumor with a colostomy at the time of her surgery on 10/27/2008. The colostomy has been subsequently reversed on 09/20/2010. Kayla [name] received chemotherapy consisting of FOLFOX for 10 treatments from 12/10/2008 through 06/02/2009. She achieved a partial remission from these treatments. She then received additional chemotherapy with 5-FU, leucovorin, and 5-FU by continuous infusion along with Avastin from 06/23/2009 through 11/17/2009. 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   Current therapy:  None  Interim History:  She was seen today for followup of her metastatic colon cancer with recurrence in the left parietal region of the brain.   She was last seen by me on 11/04/2013.  Her condition remains stable and without evidence for recurrence of disease.  Her main complaints are paresthesias in her feet related to the oxaliplatin. She states the right is worst than the left.  She denies any recent falls. She denies any headaches, any obvious weakness or residual effects from her brain tumor and its surgery.  She is not driving.  She denies any recent hospitalizations and reports continued compliance to her medicines.  She saw Dr. Leone Payor back in May for a repeat colonoscopy.  She had her mammogram this past January.  Today, she is accompanied by her son Kayla [name].   Medications: I have reviewed the patient's current medications.  Current Outpatient Prescriptions  Medication Sig Dispense Refill  . acetaminophen (TYLENOL) 500 MG tablet Take 1,000 mg by mouth every 6 (six) hours as needed.      . gabapentin (NEURONTIN) 300 MG capsule TAKE ONE CAPSULE BY MOUTH AT BEDTIME  90 capsule  0  . ibuprofen  (ADVIL,MOTRIN) 200 MG tablet Take 200 mg by mouth every 8 (eight) hours as needed. For pain      . lisinopril (PRINIVIL,ZESTRIL) 40 MG tablet TAKE ONE TABLET BY MOUTH DAILY  30 tablet  0   Current Facility-Administered Medications  Medication Dose Route Frequency Provider Last Rate Last Dose  . sodium chloride 0.9 % injection 10 mL  10 mL Intravenous PRN Myra Rude, MD   10 mL at 02/03/14 1040   Allergies:  Allergies  Allergen Reactions  . Sulfonamide Derivatives Rash   Oncology History: Diagnosis of colon cancer dates back to 10/27/2008 when Kayla [name] presented with what turned out to be a bowel perforation through tumor. Stage at that time was T4 N1 M1 with pulmonary metastatic disease. The K-ras mutation was detected. Kayla [name] underwent surgical resection of her tumor with a colostomy at the time of her surgery on 10/27/2008. The colostomy has been subsequently reversed on 09/20/2010. Kayla [name] received chemotherapy consisting of FOLFOX for 10 treatments from 12/10/2008 through 06/02/2009. She achieved a partial remission from these treatments. She then received additional chemotherapy with 5-FU, leucovorin, and 5-FU by continuous infusion along with Avastin from 06/23/2009 through 11/17/2009. 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 She was doing well without evidence of disease until she developed a right hemiparesthesia in September 2012 and was found to have a 3 x 3 x 2.4 cm, lobulated, enhancing mass in the left parietal lobe with marked surrounding edema. A PET scan on 03/25/2011 showed resolution of the previously identified pulmonary nodules with no residual hypermetabolic activity. The pericardial effusion, which has been present  since diagnosis, was unchanged. There were also uterine fibroids and a low-density lesion in the anterior spleen that was stable and not associated with any hypermetabolic activity. Of note, Kayla [name] had a markedly elevated CEA up to 24.7  on 03/23/2011. On 06/23/2011, the CEA was less than 0.5. Dr. [name] resected the recurrent metastatic colon cancer on 03/31/2011. This was followed by stereotactic radiation on 04/15/2011. The patient underwent a left-sided craniotomy and excision of the mass involving her left parietal lobe on 11/10/2011 by Dr. [name]. The pathology report was negative for any malignancy. Pathology report indicated benign brain with fibrosis, hemorrhage, hemosiderin deposition, abundant dystrophic calcifications, and mixed acute and chronic inflammation including foreign body multinucleated giant cells.  y.   Past Medical History, Surgical history, Social history, and Family History were reviewed and updated.  SMOKING HISTORY:  The patient has smoked intermittently, about 2 packs of cigarettes per week, since she was a teenager, but stopped smoking in 2010.  Review of Systems: Constitutional:  Negative for fever, chills, night sweats, anorexia, weight loss, pain. Cardiovascular: no chest pain or dyspnea on exertion Respiratory: no cough, shortness of breath, or wheezing Neurological: no TIA or stroke symptoms Dermatological: negative for rash ENT: negative for - epistaxis, headaches, tinnitus, vertigo or visual changes Skin: Negative. Gastrointestinal: no abdominal pain, change in bowel habits, or black or bloody stools positive for - heartburn Genito-Urinary: no dysuria, trouble voiding, or hematuria Hematological and Lymphatic: negative for - bleeding problems, fatigue, jaundice or pallor Breast: negative for breast lumps Musculoskeletal: positive for - muscular weakness and pins and needles with some numbness (R>L) in lower extremities negative for - gait disturbance Remaining ROS negative. Physical Exam: Blood pressure 116/54, pulse 82, temperature 98.5 F (36.9 C), temperature source Oral, resp. rate 18, height 5\' 3"  (1.6 m), weight 186 lb 11.2 oz (84.687 kg), SpO2 97.00%. ECOG:  0/1 General appearance: alert, cooperative, appears stated age, no distress and mildly obese Head: Normocephalic, without obvious abnormality, atraumatic Neck: no adenopathy, supple, symmetrical, trachea midline and thyroid not enlarged, symmetric, no tenderness/mass/nodules HEENT: PERRLA; EOMi; No sclerae icterus. OP clear of masses.  Lymph nodes: Cervical, supraclavicular, and axillary nodes normal. Heart:regular rate and rhythm, S1, S2 normal, no murmur, click, rub or gallop Lung:chest clear, no wheezing, rales, normal symmetric air entry,  + R sided port-a-cath. Abdomin: soft, non-tender, without masses or organomegaly, normal bowel sounds and surgical scars well healed. EXT:No peripheral edema.  R>L decreased sensation and mildly decreased strength (noted in prior exams) Neuro: R lower extremity weakness and decreased sensation.  Normal gait.  Good finger to nose bilaterally. Otherwise no focal deficits.   Lab Results: Lab Results  Component Value Date   WBC 9.3 01/30/2014   HGB 14.2 01/30/2014   HCT 44.0 01/30/2014   MCV 85.4 01/30/2014   PLT 202 01/30/2014     Chemistry      Component Value Date/Time   NA 142 01/30/2014 1020   NA 140 03/01/2012 1112   NA 142 10/05/2010 1132   K 4.3 01/30/2014 1020   K 4.0 03/01/2012 1112   K 4.0 10/05/2010 1132   CL 108* 10/23/2012 0828   CL 105 03/01/2012 1112   CL 101 10/05/2010 1132   CO2 25 01/30/2014 1020   CO2 26 03/01/2012 1112   CO2 28 10/05/2010 1132   BUN 18.5 01/30/2014 1020   BUN 14 03/01/2012 1112   BUN 13 10/05/2010 1132   CREATININE 0.8 01/30/2014 1020   CREATININE 0.68  03/01/2012 1112   CREATININE 0.8 10/05/2010 1132      Component Value Date/Time   CALCIUM 9.8 01/30/2014 1020   CALCIUM 9.5 03/01/2012 1112   CALCIUM 9.4 10/05/2010 1132   ALKPHOS 122 01/30/2014 1020   ALKPHOS 117 03/01/2012 1112   ALKPHOS 184* 10/05/2010 1132   AST 28 01/30/2014 1020   AST 17 03/01/2012 1112   AST 28 10/05/2010 1132   ALT 33 01/30/2014 1020   ALT 13  03/01/2012 1112   ALT 26 10/05/2010 1132   BILITOT 1.07 01/30/2014 1020   BILITOT 0.9 03/01/2012 1112   BILITOT 0.60 10/05/2010 1132     Results for AUNNA, STARCEVICH A (MRN [id number]) as of 02/03/2014 10:09  Ref. Range 01/30/2014 10:19  LDH Latest Range: 125-245 U/L 148  CEA Latest Range: 0.0-5.0 ng/mL 1.3   Radiological Studies: 1. MRI of the head with and without IV contrast on 03/23/2011 showed a solitary enhancing mass in the left parietal lobe with surrounding white matter edema, most compatible with solitary metastatic deposit. The mass lesion measured 2.7 x 2.5 cm.  2. PET scan from 03/25/2011 showed resolution of the previously- identified pulmonary nodules with no residual hypermetabolic activity noted in the neck, chest, abdomen or pelvis to suggest active malignancy. There was a pericardial effusion and some subsegmental atelectasis in the lower lobes. There were also uterine fibroids and a low-density lesion in the anterior spleen that was stable and not associated with hypermetabolic activity.  3. MRI of the head with and without IV contrast showed Stealth protocol utilized to evaluate left parietal mass. The mass measured 3 x 3 x 2.4 cm with marked surrounding vasogenic edema.  4. MRI of the head with and without IV contrast on 04/08/2011 showed postsurgical changes of tumor removal in the left parietal lobe and a postsurgical hematoma with enhancement. There was 3 mm of midline shift. No other metastatic deposits.  5. MRI of the head with and without IV contrast on 06/28/2011 showed a 3 cm area of restricted diffusion lying within the previous area of  left parietal surgical resection for colon cancer. There was enhancement of the peripheral aspect of the cavity, marked restricted diffusion and increasing vasogenic edema. There was concern about the development of a brain abscess.  6. MRI of the head with and without IV contrast on 07/29/2011 showed left parietal postoperative hematoma was  smaller compared with the  prior study. There was no nodular enhancement seen to indicate tumor. No new enhancing lesions were seen. There was significant  improvement in the left parietal edema.  7. Digital screening mammogram was negative on 08/02/2011.  8. An MRI of the head with and without IV contrast on 10/21/2011 showed development of gyriform enhancement circumferentially along the margins of the left parietal postoperative space/hematoma. Marked increase in vasogenic edema was seen. 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 There was further contraction of the hematoma/postoperative space in the left parietal cortical region, now measuring 17 x 19 mm in transverse diameter as opposed to 22 x 21 mm previously.  Left-to-right shift was 2 mm.  9. Nuclear medicine brain PET-CT scan carried out on 10/27/2011 showed hypermetabolic activity in the high left parietal lobe which corresponds to gyriform enhancement on comparison MRI from 10/21/2011. This was felt to be most consistent with recurrent  tumor.  10. Chest x-ray, 2 view from 11/04/2011, showed chronic cardiomegaly. Port-A-Cath was in place. No acute disease was present.  11. MRI of the head with and without IV contrast on 01/27/2012 showed findings that were felt to be consistent with progression of disease. This exam was compared with the MRI of 10/21/2011. It was recognized that the patient underwent surgical resection of the mass on 11/10/2011.  There was felt to be on the present exam residual peripheral enhancement of approximately 35 x 40 x 21 mm with infiltration of the cortex and deep white matter. It was felt that the enhancement had progressed, despite removal of the central focus. There was moderate vasogenic  edema throughout the left hemisphere but slightly decreased from the MRI from 10/21/2011. No new lesions were seen. There was mild atrophy with chronic microvascular  ischemic change, but no midline shift. Major intracranial vascular structures were patent.  12. MRI of the brain with and without IV contrast on 05/04/2012 showed previous left parietal lobe surgery for resection of tumor and radiation necrosis.  No new abnormalities were noted. 13. Chest x-ray, 2 view, on 07/03/2012 showed enlargement of the cardiac silhouette with some lingular scarring, as compared with the chest x-ray from 11/04/2011. 14. 2-D echocardiogram from 07/12/2012 showed left ventricular ejection fraction of 55-60%.  There was a small to moderate circumferential pericardial effusion with no signs of tamponade.  There was felt to be no significant change from the 2-D echocardiogram dated 08/13/2009. 15. Digital bilateral screening mammogram on 08/06/2012 was negative. 16. 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  There was no progression or new brain metastasis identified. 17. PET scan from 10/16/2012 showed no specific features to suggest metastatic disease.  There was diffuse uptake throughout the thyroid gland, possibly due to hyperthyroidism. 18. MRI of the brain with and without IV contrast on 03/12/2013 showed residual gyriform enhancement surrounding the area of previous surgical resection of a left parietal metastasis with subsequent resection of radionecrosis. The findings likely represent some residual brain injury, but there is no significant progression of radionecrosis and no new lesions seen. 19. MRI of the brain with and without IV contrast on 11/01/2013 showed Stable posttherapy appearance of the brain since May of 2014. No progression or new brain metastasis identified. 20. CT chest,abomen and pelvis with contrast. 1. No evidence of thoracic metastasis.  2. Small prevascular lymph node is similar prior. 3. Stable pericardial  effusion. 4. Interval increase in the volume presacral lymph node in the upper pelvis. This has progressed slowly from 2011. Recommend attention on follow-up versus restaging FDG PET scan. 5. Stable anastomosis in the descending colon. 21. CT chest, abdomen and pelvis with contrast 07/126/2015 (Personally reviewed by me). 1. No evidence of local colon cancer recurrence or metastasis within the abdomen or pelvis. 2. Retention of stool at the colocolonic anastomosis in the left colon is unchanged comparison exam. No obstructing lesion identified.  3. Mesenteric lymph node is again demonstrated with no increased size. 4. Stable splenic lesion likely representing a complex cysts or hemangioma. 5. Stable small pericardial effusion  Impression and Plan:  1. Stage IV Colon Cancer mets to lung/brain.  --Kayla [name] continues to do well with no evidence for disease recurrence.  She is now out  over 5 years from the time of diagnosis and nearly 3 years from the time of diagnosis of her right brain recurrence.  Clinically, she is doing well.  Her CEA was 1.3.  Will continue to flush the  Port-A-Cath every 2 months with heparin. She a colonoscopy by Dr. Leone Payor on 11/20/2013 with removal of sessille polyp from transverse colon with pathology consistent with lymphoid aggregates.  Dr. Leone Payor plans on repeat colonosocpy in 5 years in 2020.   -- Kayla [name] will return to the clinic again in 3 months, at which time we will check CBC, chemistries, LDH. She is having repeat MRIs of the brain ordered by Dr. Kathrynn Running about every 3-4 months.  All questions were answered. The patient knows to call the clinic with any problems, questions or concerns. We can certainly see the patient much sooner if necessary.   I spent 15 minutes counseling the patient face to face. The total time spent in the appointment was 25 minutes.   Timm Bonenberger, MD 7/20/201511:36 AM

## 2014-02-17 ENCOUNTER — Other Ambulatory Visit: Payer: Self-pay | Admitting: Medical Oncology

## 2014-02-17 MED ORDER — LISINOPRIL 40 MG PO TABS: 40.0000 mg | ORAL_TABLET | Freq: Every day | ORAL | Status: AC

## 2014-02-25 ENCOUNTER — Other Ambulatory Visit: Payer: Self-pay | Admitting: Internal Medicine

## 2014-02-28 ENCOUNTER — Ambulatory Visit
Admission: RE | Admit: 2014-02-28 | Discharge: 2014-02-28 | Disposition: A | Discharge status: Home / Self Care | Payer: Medicare HMO | Source: Ambulatory Visit | Attending: Radiation Oncology | Admitting: Radiation Oncology

## 2014-02-28 ENCOUNTER — Encounter (INDEPENDENT_AMBULATORY_CARE_PROVIDER_SITE_OTHER): Payer: Self-pay

## 2014-02-28 DIAGNOSIS — C7949 Secondary malignant neoplasm of other parts of nervous system: Principal | ICD-10-CM

## 2014-02-28 DIAGNOSIS — C7931 Secondary malignant neoplasm of brain: Secondary | ICD-10-CM

## 2014-02-28 MED ORDER — GADOBENATE DIMEGLUMINE 529 MG/ML IV SOLN: 17.0000 mL | Freq: Once | INTRAVENOUS | Status: AC | PRN

## 2014-03-02 ENCOUNTER — Encounter: Payer: Self-pay | Admitting: Radiation Oncology

## 2014-03-02 NOTE — Progress Notes (Signed)
  Radiation Oncology         [phone]) [phone] ________________________________  Name: Kayla [name] MRN: [id number]  Date: 03/03/2014  DOB: Dec 08, 1950  Multidisciplinary Neuro Oncology Clinic Follow-Up Visit Note  CC: No PCP Per Patient  No ref. 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 Vertell Limber from neurosurgery.  The recent films were presented in our multidisciplinary conference with neuroradiology just prior to the clinic.  She is without complaint except some right foot numbness                              ALLERGIES:  is allergic to sulfonamide derivatives.  Meds: Current Outpatient Prescriptions  Medication Sig Dispense Refill  . acetaminophen (TYLENOL) 500 MG tablet Take 1,000 mg by mouth every 6 (six) hours as needed.      . gabapentin (NEURONTIN) 300 MG capsule TAKE ONE CAPSULE BY MOUTH AT BEDTIME  90 capsule  0  . ibuprofen (ADVIL,MOTRIN) 200 MG tablet Take 200 mg by mouth every 8 (eight) hours as needed. For pain      . lisinopril (PRINIVIL,ZESTRIL) 40 MG tablet Take 1 tablet (40 mg total) by mouth daily.  90 tablet  0   No current facility-administered medications for this encounter.    Physical Findings: The patient is in no acute distress. Patient is alert and oriented.  weight is 188 lb 11.2 oz (85.594 kg). Her blood pressure is 142/78 and her pulse is 96. Her respiration is 16. .  No significant changes.  Motor strength intact.  Speech fluent and articulate.  Lab Findings: Lab Results  Component Value Date   WBC 9.3 01/30/2014   HGB 14.2 01/30/2014   HCT 44.0 01/30/2014   MCV 85.4 01/30/2014   PLT 202 01/30/2014    @LASTCHEM @  Radiographic Findings: Mr [name] FM Contrast  02/28/2014   CLINICAL DATA:  S RS restaging for metastatic  cancer.  EXAM: MRI HEAD WITHOUT AND WITH CONTRAST  TECHNIQUE: Multiplanar, multiecho pulse sequences of the brain and surrounding structures were obtained without and with intravenous contrast.  CONTRAST:  17 cc MultiHance  COMPARISON:  11/01/2013.  06/21/2013.  03/12/2013.  FINDINGS: No change, progressive or new lesion since the previous study.  Diffusion imaging does not show any acute infarction. No hydrocephalus or extra-axial collection.  The brainstem and cerebellum are normal. The right cerebral hemispheres normal except for chronic small-vessel changes of the white matter.  On the left, there has been previous parietal craniotomy. Atrophy and gliosis at the left parietal vertex remains the same. Enhancement in that region appears the same without increasing mass effect. No increasing edema. No new lesions are evident.  IMPRESSION: Stable examination over time. No evidence of new or progressive disease.  Atrophy and gliosis at the left parietal vertex with stable abnormal enhancement. No mass effect or increasing edema.   Electronically Signed   By: Nelson Chimes M.D.   On: 02/28/2014 15:15    Impression:  The patient stable.  Plan:  MRI in 4 months, then follow-up.  _____________________________________  Sheral Apley. Tammi Klippel, M.D. and  Erline Levine, M.D.

## 2014-03-03 ENCOUNTER — Ambulatory Visit
Admission: RE | Admit: 2014-03-03 | Discharge: 2014-03-03 | Disposition: A | Discharge status: Home / Self Care | Payer: Medicare HMO | Source: Ambulatory Visit | Attending: Radiation Oncology | Admitting: Radiation Oncology

## 2014-03-03 ENCOUNTER — Encounter: Payer: Self-pay | Admitting: Radiation Oncology

## 2014-03-03 ENCOUNTER — Ambulatory Visit
Admission: RE | Admit: 2014-03-03 | Discharge: 2014-03-03 | Disposition: A | Discharge status: Home / Self Care | Payer: Medicare HMO | Source: Ambulatory Visit | Attending: Neurosurgery | Admitting: Neurosurgery

## 2014-03-03 VITALS — BP 142/78 | HR 96 | Resp 16 | Wt 188.7 lb

## 2014-03-03 DIAGNOSIS — C7949 Secondary malignant neoplasm of other parts of nervous system: Principal | ICD-10-CM

## 2014-03-03 DIAGNOSIS — C7931 Secondary malignant neoplasm of brain: Secondary | ICD-10-CM

## 2014-03-03 NOTE — Progress Notes (Signed)
Reports occasional temporal tension relieved by shifting head left to right. Denies regular headache, dizziness, nausea, vomiting, or diplopia. Reports occasional ringing in the ears. Steady gait noted. Pleasant affect noted. Denies pain today

## 2014-03-31 ENCOUNTER — Ambulatory Visit (HOSPITAL_BASED_OUTPATIENT_CLINIC_OR_DEPARTMENT_OTHER): Payer: Medicare HMO

## 2014-03-31 VITALS — BP 133/75 | HR 90 | Temp 97.5°F

## 2014-03-31 DIAGNOSIS — C7931 Secondary malignant neoplasm of brain: Secondary | ICD-10-CM

## 2014-03-31 DIAGNOSIS — C7949 Secondary malignant neoplasm of other parts of nervous system: Secondary | ICD-10-CM

## 2014-03-31 DIAGNOSIS — Z452 Encounter for adjustment and management of vascular access device: Secondary | ICD-10-CM

## 2014-03-31 DIAGNOSIS — C186 Malignant neoplasm of descending colon: Secondary | ICD-10-CM

## 2014-03-31 DIAGNOSIS — Z95828 Presence of other vascular implants and grafts: Secondary | ICD-10-CM

## 2014-03-31 MED ORDER — SODIUM CHLORIDE 0.9 % IJ SOLN
10.0000 mL | INTRAMUSCULAR | Status: DC | PRN
  Administered 2014-03-31: 10 mL via INTRAVENOUS
  Filled 2014-03-31: qty 10

## 2014-03-31 MED ORDER — HEPARIN SOD (PORK) LOCK FLUSH 100 UNIT/ML IV SOLN
500.0000 [IU] | Freq: Once | INTRAVENOUS | Status: AC
  Administered 2014-03-31: 500 [IU] via INTRAVENOUS
  Filled 2014-03-31: qty 5

## 2014-03-31 NOTE — Patient Instructions (Signed)

## 2014-04-04 ENCOUNTER — Other Ambulatory Visit: Payer: Self-pay | Admitting: Radiation Therapy

## 2014-04-04 DIAGNOSIS — C7949 Secondary malignant neoplasm of other parts of nervous system: Principal | ICD-10-CM

## 2014-04-04 DIAGNOSIS — C7931 Secondary malignant neoplasm of brain: Secondary | ICD-10-CM

## 2014-04-07 ENCOUNTER — Other Ambulatory Visit: Payer: Self-pay | Admitting: *Deleted

## 2014-04-28 ENCOUNTER — Encounter: Payer: Self-pay | Admitting: Hematology

## 2014-04-28 ENCOUNTER — Other Ambulatory Visit (HOSPITAL_BASED_OUTPATIENT_CLINIC_OR_DEPARTMENT_OTHER): Payer: Medicare HMO

## 2014-04-28 ENCOUNTER — Ambulatory Visit (HOSPITAL_BASED_OUTPATIENT_CLINIC_OR_DEPARTMENT_OTHER): Payer: Medicare HMO | Admitting: Hematology

## 2014-04-28 ENCOUNTER — Other Ambulatory Visit: Payer: Medicare HMO

## 2014-04-28 VITALS — BP 135/80 | HR 90 | Temp 98.1°F | Resp 18 | Ht 63.0 in | Wt 186.3 lb

## 2014-04-28 DIAGNOSIS — C189 Malignant neoplasm of colon, unspecified: Secondary | ICD-10-CM

## 2014-04-28 DIAGNOSIS — Z85038 Personal history of other malignant neoplasm of large intestine: Secondary | ICD-10-CM

## 2014-04-28 DIAGNOSIS — C7931 Secondary malignant neoplasm of brain: Secondary | ICD-10-CM

## 2014-04-28 LAB — COMPREHENSIVE METABOLIC PANEL (CC13)
ALT: 35 U/L (ref 0–55)
ANION GAP: 11 meq/L (ref 3–11)
AST: 28 U/L (ref 5–34)
Albumin: 3.7 g/dL (ref 3.5–5.0)
Alkaline Phosphatase: 122 U/L (ref 40–150)
BUN: 12.1 mg/dL (ref 7.0–26.0)
CALCIUM: 10.1 mg/dL (ref 8.4–10.4)
CO2: 26 meq/L (ref 22–29)
Chloride: 105 mEq/L (ref 98–109)
Creatinine: 0.9 mg/dL (ref 0.6–1.1)
Glucose: 99 mg/dl (ref 70–140)
POTASSIUM: 4 meq/L (ref 3.5–5.1)
Sodium: 142 mEq/L (ref 136–145)
TOTAL PROTEIN: 7.8 g/dL (ref 6.4–8.3)
Total Bilirubin: 0.84 mg/dL (ref 0.20–1.20)

## 2014-04-28 LAB — CBC WITH DIFFERENTIAL/PLATELET
BASO%: 1.2 % (ref 0.0–2.0)
Basophils Absolute: 0.1 10*3/uL (ref 0.0–0.1)
EOS%: 2.2 % (ref 0.0–7.0)
Eosinophils Absolute: 0.2 10*3/uL (ref 0.0–0.5)
HEMATOCRIT: 45.4 % (ref 34.8–46.6)
HGB: 14.6 g/dL (ref 11.6–15.9)
LYMPH#: 1.8 10*3/uL (ref 0.9–3.3)
LYMPH%: 22.6 % (ref 14.0–49.7)
MCH: 27.4 pg (ref 25.1–34.0)
MCHC: 32.2 g/dL (ref 31.5–36.0)
MCV: 84.9 fL (ref 79.5–101.0)
MONO#: 0.4 10*3/uL (ref 0.1–0.9)
MONO%: 5.4 % (ref 0.0–14.0)
NEUT#: 5.4 10*3/uL (ref 1.5–6.5)
NEUT%: 68.6 % (ref 38.4–76.8)
Platelets: 219 10*3/uL (ref 145–400)
RBC: 5.34 10*6/uL (ref 3.70–5.45)
RDW: 14.5 % (ref 11.2–14.5)
WBC: 7.9 10*3/uL (ref 3.9–10.3)

## 2014-04-28 LAB — LACTATE DEHYDROGENASE (CC13): LDH: 160 U/L (ref 125–245)

## 2014-04-28 NOTE — Progress Notes (Signed)
Hematology and Oncology Follow Up Visit Date of Visit: 04/28/2014  Kayla [name] [id number] 12/21/1950 63 y.o. 04/28/2014 11:00 AM  PCP: [facility] [phone])  Principle Diagnosis: Colon cancer, T4N1M1, Stage IV  Prior Therapy:  Kayla [name] underwent surgical resection of her tumor with a colostomy at the time of her surgery on 10/27/2008. The colostomy has been subsequently reversed on 09/20/2010. Chandra received chemotherapy consisting of FOLFOX for 10 treatments from 12/10/2008 through 06/02/2009. She achieved a partial remission from these treatments. She then received additional chemotherapy with 5-FU, leucovorin, and 5-FU by continuous infusion along with Avastin from 06/23/2009 through 11/17/2009. 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   Current therapy:  None  Interim History:  She was last seen by Dr Juliann Mule on 02/03/14 for followup of her metastatic colon cancer with recurrence in the left parietal region of the brain.   Her condition remains stable and without evidence for recurrence of disease.  Her main complaints are paresthesias in her feet related to the oxaliplatin and she takes neurontin at night time. She states the right is worst than the left.  She denies any recent falls. She denies any headaches, any obvious weakness or residual effects from her brain tumor and its surgery.  She is not driving.  She denies any recent hospitalizations and reports continued compliance to her medicines.  She saw Dr. Carlean Purl back in May for a repeat colonoscopy.  She had her mammogram this past January.      Medications: I have reviewed the patient's current medications.  Current Outpatient Prescriptions  Medication Sig Dispense Refill  . acetaminophen (TYLENOL) 500 MG tablet Take 1,000 mg by mouth every 6 (six) hours as needed.      . gabapentin (NEURONTIN) 300 MG capsule TAKE ONE CAPSULE BY MOUTH AT BEDTIME  90 capsule  0  . ibuprofen  (ADVIL,MOTRIN) 200 MG tablet Take 200 mg by mouth every 8 (eight) hours as needed. For pain      . lisinopril (PRINIVIL,ZESTRIL) 40 MG tablet Take 1 tablet (40 mg total) by mouth daily.  90 tablet  0   No current facility-administered medications for this visit.   Allergies:  Allergies  Allergen Reactions  . Sulfonamide Derivatives Rash   Oncology History: Diagnosis of colon cancer dates back to 10/27/2008 when Kayla [name] presented with what turned out to be a bowel perforation through tumor. Stage at that time was T4 N1 M1 with pulmonary metastatic disease. The K-ras mutation was detected. Kayla [name] underwent surgical resection of her tumor with a colostomy at the time of her surgery on 10/27/2008. The colostomy has been subsequently reversed on 09/20/2010. Kayla [name] received chemotherapy consisting of FOLFOX for 10 treatments from 12/10/2008 through 06/02/2009. She achieved a partial remission from these treatments. She then received additional chemotherapy with 5-FU, leucovorin, and 5-FU by continuous infusion along with Avastin from 06/23/2009 through 11/17/2009. 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 She was doing well without evidence of disease until she developed a right hemiparesthesia in September 2012 and was found to have a 3 x 3 x 2.4 cm, lobulated, enhancing mass in the left parietal lobe with marked surrounding edema. A PET scan on 03/25/2011 showed resolution of the previously identified pulmonary nodules with no residual hypermetabolic activity. The pericardial effusion, which has been present since diagnosis, was unchanged. There were also uterine fibroids and a low-density lesion in the anterior spleen that was stable and not associated with any  hypermetabolic activity. Of note, Kayla [name] had a markedly elevated CEA up to 24.7 on 03/23/2011. On 06/23/2011, the CEA was less than 0.5. Dr. Erline Levine resected the recurrent metastatic colon cancer on 03/31/2011.  This was followed by stereotactic radiation on 04/15/2011. The patient underwent a left-sided craniotomy and excision of the mass involving her left parietal lobe on 11/10/2011 by Dr. Erline Levine. The pathology report was negative for any malignancy. Pathology report indicated benign brain with fibrosis, hemorrhage, hemosiderin deposition, abundant dystrophic calcifications, and mixed acute and chronic inflammation including foreign body multinucleated giant cells.    Past Medical History, Surgical history, Social history, and Family History were reviewed and updated.  SMOKING HISTORY:  The patient has smoked intermittently, about 2 packs of cigarettes per week, since she was a teenager, but stopped smoking in 2010.  Review of Systems: Constitutional:  Negative for fever, chills, night sweats, anorexia, weight loss, pain. Cardiovascular: no chest pain or dyspnea on exertion Respiratory: no cough, shortness of breath, or wheezing Neurological: no TIA or stroke symptoms Dermatological: negative for rash ENT: negative for - epistaxis, headaches, tinnitus, vertigo or visual changes Skin: Negative. Gastrointestinal: no abdominal pain, change in bowel habits, or black or bloody stools positive for - heartburn Genito-Urinary: no dysuria, trouble voiding, or hematuria Hematological and Lymphatic: negative for - bleeding problems, fatigue, jaundice or pallor Breast: negative for breast lumps Musculoskeletal: positive for - muscular weakness and pins and needles with some numbness (R>L) in lower extremities negative for - gait disturbance Remaining ROS negative. Physical Exam: Blood pressure 135/80, pulse 90, temperature 98.1 F (36.7 C), temperature source Oral, resp. rate 18, height 5' 3"  (1.6 m), weight 186 lb 4.8 oz (84.505 kg). ECOG: 0/1 General appearance: alert, cooperative, appears stated age, no distress and mildly obese Head: Normocephalic, without obvious abnormality, atraumatic Neck:  no adenopathy, supple, symmetrical, trachea midline and thyroid not enlarged, symmetric, no tenderness/mass/nodules HEENT: PERRLA; EOMi; No sclerae icterus. OP clear of masses.  Lymph nodes: Cervical, supraclavicular, and axillary nodes normal. Heart:regular rate and rhythm, S1, S2 normal, no murmur, click, rub or gallop Lung:chest clear, no wheezing, rales, normal symmetric air entry,  + R sided port-a-cath. Abdomin: soft, non-tender, without masses or organomegaly, normal bowel sounds and surgical scars well healed. EXT:No peripheral edema.  R>L decreased sensation and mildly decreased strength (noted in prior exams) Neuro: R lower extremity weakness and decreased sensation.  Normal gait.  Good finger to nose bilaterally. Otherwise no focal deficits.   Lab Results: Lab Results  Component Value Date   WBC 7.9 04/28/2014   HGB 14.6 04/28/2014   HCT 45.4 04/28/2014   MCV 84.9 04/28/2014   PLT 219 04/28/2014     Chemistry      Component Value Date/Time   NA 142 01/30/2014 1020   NA 140 03/01/2012 1112   NA 142 10/05/2010 1132   K 4.3 01/30/2014 1020   K 4.0 03/01/2012 1112   K 4.0 10/05/2010 1132   CL 108* 10/23/2012 0828   CL 105 03/01/2012 1112   CL 101 10/05/2010 1132   CO2 25 01/30/2014 1020   CO2 26 03/01/2012 1112   CO2 28 10/05/2010 1132   BUN 18.5 01/30/2014 1020   BUN 14 03/01/2012 1112   BUN 13 10/05/2010 1132   CREATININE 0.8 01/30/2014 1020   CREATININE 0.68 03/01/2012 1112   CREATININE 0.8 10/05/2010 1132      Component Value Date/Time   CALCIUM 9.8 01/30/2014 1020   CALCIUM 9.5 03/01/2012 1112  CALCIUM 9.4 10/05/2010 1132   ALKPHOS 122 01/30/2014 1020   ALKPHOS 117 03/01/2012 1112   ALKPHOS 184* 10/05/2010 1132   AST 28 01/30/2014 1020   AST 17 03/01/2012 1112   AST 28 10/05/2010 1132   ALT 33 01/30/2014 1020   ALT 13 03/01/2012 1112   ALT 26 10/05/2010 1132   BILITOT 1.07 01/30/2014 1020   BILITOT 0.9 03/01/2012 1112   BILITOT 0.60 10/05/2010 1132     CEA AND CMET PENDING FROM  TODAY  Results for JOHANNA, MATTO (MRN [id number]) as of 02/03/2014 10:09  Ref. Range 01/30/2014 10:19  LDH Latest Range: 125-245 U/L 148  CEA Latest Range: 0.0-5.0 ng/mL 1.3   Radiological Studies: 1. MRI of the head with and without IV contrast on 03/23/2011 showed a solitary enhancing mass in the left parietal lobe with surrounding white matter edema, most compatible with solitary metastatic deposit. The mass lesion measured 2.7 x 2.5 cm.  2. PET scan from 03/25/2011 showed resolution of the previously- identified pulmonary nodules with no residual hypermetabolic activity noted in the neck, chest, abdomen or pelvis to suggest active malignancy. There was a pericardial effusion and some subsegmental atelectasis in the lower lobes. There were also uterine fibroids and a low-density lesion in the anterior spleen that was stable and not associated with hypermetabolic activity.  3. MRI of the head with and without IV contrast showed Stealth protocol utilized to evaluate left parietal mass. The mass measured 3 x 3 x 2.4 cm with marked surrounding vasogenic edema.  4. MRI of the head with and without IV contrast on 04/08/2011 showed postsurgical changes of tumor removal in the left parietal lobe and a postsurgical hematoma with enhancement. There was 3 mm of midline shift. No other metastatic deposits.  5. MRI of the head with and without IV contrast on 06/28/2011 showed a 3 cm area of restricted diffusion lying within the previous area of  left parietal surgical resection for colon cancer. There was enhancement of the peripheral aspect of the cavity, marked restricted diffusion and increasing vasogenic edema. There was concern about the development of a brain abscess.  6. MRI of the head with and without IV contrast on 07/29/2011 showed left parietal postoperative hematoma was smaller compared with the  prior study. There was no nodular enhancement seen to indicate tumor. No new enhancing lesions were  seen. There was significant  improvement in the left parietal edema.  7. Digital screening mammogram was negative on 08/02/2011.  8. An MRI of the head with and without IV contrast on 10/21/2011 showed development of gyriform enhancement circumferentially along the margins of the left parietal postoperative space/hematoma. Marked increase in vasogenic edema was seen. 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 There was further contraction of the hematoma/postoperative space in the left parietal cortical region, now measuring 17 x 19 mm in transverse diameter as opposed to 22 x 21 mm previously.  Left-to-right shift was 2 mm.  9. Nuclear medicine brain PET-CT scan carried out on 10/27/2011 showed hypermetabolic activity in the high left parietal lobe which corresponds to gyriform enhancement on comparison MRI from 10/21/2011. This was felt to be most consistent with recurrent  tumor.  10. Chest x-ray, 2 view from 11/04/2011, showed chronic cardiomegaly. Port-A-Cath was in place. No acute disease was present.  11. MRI of the head with and without IV contrast on 01/27/2012 showed findings that were felt to be consistent with progression  of disease. This exam was compared with the MRI of 10/21/2011. It was recognized that the patient underwent surgical resection of the mass on 11/10/2011.  There was felt to be on the present exam residual peripheral enhancement of approximately 35 x 40 x 21 mm with infiltration of the cortex and deep white matter. It was felt that the enhancement had progressed, despite removal of the central focus. There was moderate vasogenic  edema throughout the left hemisphere but slightly decreased from the MRI from 10/21/2011. No new lesions were seen. There was mild atrophy with chronic microvascular ischemic change, but no midline shift. Major intracranial vascular structures were patent.  12. MRI of the brain with and without IV  contrast on 05/04/2012 showed previous left parietal lobe surgery for resection of tumor and radiation necrosis.  No new abnormalities were noted. 13. Chest x-ray, 2 view, on 07/03/2012 showed enlargement of the cardiac silhouette with some lingular scarring, as compared with the chest x-ray from 11/04/2011. 14. 2-D echocardiogram from 07/12/2012 showed left ventricular ejection fraction of 55-60%.  There was a small to moderate circumferential pericardial effusion with no signs of tamponade.  There was felt to be no significant change from the 2-D echocardiogram dated 08/13/2009. 15. Digital bilateral screening mammogram on 08/06/2012 was negative. 16. 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  There was no progression or new brain metastasis identified. 17. PET scan from 10/16/2012 showed no specific features to suggest metastatic disease.  There was diffuse uptake throughout the thyroid gland, possibly due to hyperthyroidism. 18. MRI of the brain with and without IV contrast on 03/12/2013 showed residual gyriform enhancement surrounding the area of previous surgical resection of a left parietal metastasis with subsequent resection of radionecrosis. The findings likely represent some residual brain injury, but there is no significant progression of radionecrosis and no new lesions seen. 19. MRI of the brain with and without IV contrast on 11/01/2013 showed Stable posttherapy appearance of the brain since May of 2014. No progression or new brain metastasis identified. 20. CT chest,abomen and pelvis with contrast. 1. No evidence of thoracic metastasis.  2. Small prevascular lymph node is similar prior. 3. Stable pericardial effusion. 4. Interval increase in the volume presacral lymph node in the upper pelvis. This has progressed slowly from 2011. Recommend  attention on follow-up versus restaging FDG PET scan. 5. Stable anastomosis in the descending colon. 21. CT chest, abdomen and pelvis with contrast 07/126/2015 (Personally reviewed by me). 1. No evidence of local colon cancer recurrence or metastasis within the abdomen or pelvis. 2. Retention of stool at the colocolonic anastomosis in the left colon is unchanged comparison exam. No obstructing lesion identified.  3. Mesenteric lymph node is again demonstrated with no increased size. 4. Stable splenic lesion likely representing a complex cysts or hemangioma. 5. Stable small pericardial effusion  Impression and Plan:  1. Stage IV Colon Cancer mets to lung/brain.  --Tramaine continues to do well with no evidence for disease recurrence.  She is now out  over 5 years from the time of diagnosis and nearly 3 years from the time of diagnosis of her right brain recurrence.  Clinically, she is doing well.  Her CEA was 1.3 2 months ago but pending from today.  Will continue to flush the Port-A-Cath every 2 months with heparin. She a colonoscopy by Dr. Carlean Purl on 11/20/2013 with  removal of sessille polyp from transverse colon with pathology consistent with lymphoid aggregates.  Dr. Carlean Purl plans on repeat colonosocpy in 5 years in 2020.   -- [place] will return to the clinic again in 3 months, at which time we will check CBC, chemistries, LDH. She is having repeat MRIs of the brain ordered by Dr. Tammi Klippel about every 3-4 months. Next Brain MRI on 06/27/2014.  --She does not want flu vaccination it was offered.  All questions were answered. The patient knows to call the clinic with any problems, questions or concerns. We can certainly see the patient much sooner if necessary.   I spent 15 minutes counseling the patient face to face. The total time spent in the appointment was 25 minutes.   Bernadene Bell, MD Medical Hematologist/Oncologist [place] Pager: [phone] Office No:  [phone]

## 2014-04-29 ENCOUNTER — Telehealth: Payer: Self-pay | Admitting: Hematology

## 2014-04-29 LAB — CEA: CEA: 0.9 ng/mL (ref 0.0–5.0)

## 2014-04-29 NOTE — Telephone Encounter (Signed)
Confirm 81mo apt for 07/29/14. Also confirm 06/05/14 apt. pt will pick up new sch 06/05/14. Chief Strategy Officer

## 2014-05-26 ENCOUNTER — Ambulatory Visit (HOSPITAL_BASED_OUTPATIENT_CLINIC_OR_DEPARTMENT_OTHER): Payer: Medicare HMO

## 2014-05-26 DIAGNOSIS — Z95828 Presence of other vascular implants and grafts: Secondary | ICD-10-CM

## 2014-05-26 DIAGNOSIS — C186 Malignant neoplasm of descending colon: Secondary | ICD-10-CM

## 2014-05-26 DIAGNOSIS — C7931 Secondary malignant neoplasm of brain: Secondary | ICD-10-CM

## 2014-05-26 DIAGNOSIS — C7800 Secondary malignant neoplasm of unspecified lung: Secondary | ICD-10-CM

## 2014-05-26 DIAGNOSIS — Z452 Encounter for adjustment and management of vascular access device: Secondary | ICD-10-CM

## 2014-05-26 MED ORDER — SODIUM CHLORIDE 0.9 % IJ SOLN
10.0000 mL | INTRAMUSCULAR | Status: DC | PRN
  Administered 2014-05-26: 10 mL via INTRAVENOUS
  Filled 2014-05-26: qty 10

## 2014-05-26 MED ORDER — HEPARIN SOD (PORK) LOCK FLUSH 100 UNIT/ML IV SOLN
500.0000 [IU] | Freq: Once | INTRAVENOUS | Status: AC
  Administered 2014-05-26: 500 [IU] via INTRAVENOUS
  Filled 2014-05-26: qty 5

## 2014-05-26 NOTE — Patient Instructions (Signed)

## 2014-06-26 ENCOUNTER — Other Ambulatory Visit: Payer: Self-pay | Admitting: Radiation Therapy

## 2014-06-26 DIAGNOSIS — C7931 Secondary malignant neoplasm of brain: Secondary | ICD-10-CM

## 2014-06-27 ENCOUNTER — Ambulatory Visit
Admission: RE | Admit: 2014-06-27 | Discharge: 2014-06-27 | Disposition: A | Discharge status: Home / Self Care | Payer: Medicare HMO | Source: Ambulatory Visit | Attending: Radiation Oncology | Admitting: Radiation Oncology

## 2014-06-27 ENCOUNTER — Other Ambulatory Visit: Payer: Self-pay | Admitting: *Deleted

## 2014-06-27 ENCOUNTER — Ambulatory Visit
Admission: RE | Admit: 2014-06-27 | Discharge: 2014-06-27 | Disposition: A | Discharge status: Home / Self Care | Payer: Medicare HMO | Source: Ambulatory Visit | Attending: Hematology | Admitting: Hematology

## 2014-06-27 DIAGNOSIS — Z85038 Personal history of other malignant neoplasm of large intestine: Secondary | ICD-10-CM | POA: Insufficient documentation

## 2014-06-27 DIAGNOSIS — C7931 Secondary malignant neoplasm of brain: Secondary | ICD-10-CM

## 2014-06-27 DIAGNOSIS — Z923 Personal history of irradiation: Secondary | ICD-10-CM | POA: Insufficient documentation

## 2014-06-27 DIAGNOSIS — C801 Malignant (primary) neoplasm, unspecified: Secondary | ICD-10-CM | POA: Insufficient documentation

## 2014-06-27 DIAGNOSIS — C7949 Secondary malignant neoplasm of other parts of nervous system: Principal | ICD-10-CM

## 2014-06-27 LAB — BUN AND CREATININE (CC13)
BUN: 11.1 mg/dL (ref 7.0–26.0)
Creatinine: 0.9 mg/dL (ref 0.6–1.1)
EGFR: 84 mL/min/{1.73_m2} — ABNORMAL LOW (ref >90)

## 2014-06-27 MED ORDER — GADOBENATE DIMEGLUMINE 529 MG/ML IV SOLN
17.0000 mL | Freq: Once | INTRAVENOUS | Status: AC | PRN
  Administered 2014-06-27: 17 mL via INTRAVENOUS

## 2014-06-29 NOTE — Progress Notes (Signed)
Radiation Oncology         [phone]   Name: Kayla [name]   Date: 06/30/2014   MRN: [id number]  DOB: 09-18-1950    Multidisciplinary Brain and Spine Oncology Clinic Follow-Up Visit Note  CC: No PCP Per Patient  No ref. provider found    ICD-9-CM ICD-10-CM   1. 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  The recent films were presented in our multidisciplinary conference with neuroradiology just prior to the clinic.  Here to review recent MRI report. Vitals and weight stable. Denies pain. Right foot numbness continues without any improvement. Denies headache, dizziness, nausea, vomiting, diplopia or ringing in the ears                              ALLERGIES:  is allergic to sulfonamide derivatives.  Meds: Current Outpatient Prescriptions  Medication Sig Dispense Refill  . acetaminophen (TYLENOL) 500 MG tablet Take 1,000 mg by mouth every 6 (six) hours as needed.    . gabapentin (NEURONTIN) 300 MG capsule TAKE ONE CAPSULE BY MOUTH AT BEDTIME 90 capsule 0  . ibuprofen (ADVIL,MOTRIN) 200 MG tablet Take 200 mg by mouth every 8 (eight) hours as needed. For pain    . lisinopril (PRINIVIL,ZESTRIL) 40 MG tablet Take 1 tablet (40 mg total) by mouth daily. 90 tablet 0   No current facility-administered medications for this encounter.    Physical Findings: The patient is in no acute distress. Patient is alert and oriented.  weight is 189 lb 11.2 oz (86.047 kg). Her blood pressure is 134/75 and her pulse is 80. Her respiration is 16 and oxygen saturation is 100%. .  No significant changes.  Lab Findings: Lab Results  Component Value Date   WBC 7.9 04/28/2014   HGB 14.6 04/28/2014   HCT 45.4 04/28/2014   MCV  84.9 04/28/2014   PLT 219 04/28/2014    @LASTCHEM @  Radiographic Findings: Mr [name] CH Contrast  06/27/2014   CLINICAL DATA:  Stereotactic radiosurgery 3 year 4 month restaging. Colon cancer.  EXAM: MRI HEAD WITHOUT AND WITH CONTRAST  TECHNIQUE: Multiplanar, multiecho pulse sequences of the brain and surrounding structures were obtained without and with intravenous contrast.  CONTRAST:  12mL MULTIHANCE GADOBENATE DIMEGLUMINE 529 MG/ML IV SOLN  COMPARISON:  MRI 02/28/2014, 11/01/2013  FINDINGS: Ventricle size is normal.  Negative for acute infarct.  Postop resection radiation of metastatic disease in the left parietal lobe. This area shows postop encephalomalacia as well as hyperintensity in the white matter which is comparable to the prior study. Mild nodular enhancement of the surgical cavity is stable from the prior study.  No new enhancing lesions are identified. Periventricular and deep white matter hyperintense lesions are unchanged and likely related to chronic ischemia.  IMPRESSION: Postop resection and radiation of left parietal metastatic disease. This area is stable compared with prior imaging without evidence of recurrent tumor. No new lesions identified.   Electronically Signed   By: Franchot Gallo M.D.   On: 06/27/2014 14:39    Impression:  The patient is NED.  Plan:  MRI in 4 months then follow-up.  _____________________________________  Sheral Apley Tammi Klippel, M.D.

## 2014-06-30 ENCOUNTER — Ambulatory Visit
Admission: RE | Admit: 2014-06-30 | Discharge: 2014-06-30 | Disposition: A | Discharge status: Home / Self Care | Payer: Medicare HMO | Source: Ambulatory Visit | Attending: Radiation Oncology | Admitting: Radiation Oncology

## 2014-06-30 ENCOUNTER — Encounter: Payer: Self-pay | Admitting: Radiation Oncology

## 2014-06-30 VITALS — BP 134/75 | HR 80 | Resp 16 | Wt 189.7 lb

## 2014-06-30 DIAGNOSIS — C7949 Secondary malignant neoplasm of other parts of nervous system: Principal | ICD-10-CM

## 2014-06-30 DIAGNOSIS — C7931 Secondary malignant neoplasm of brain: Secondary | ICD-10-CM

## 2014-06-30 NOTE — Progress Notes (Signed)
Here to review recent MRI report. Vitals and weight stable. Denies pain. Right foot numbness continues without any improvement. Denies headache, dizziness, nausea, vomiting, diplopia or ringing in the ears.

## 2014-07-09 ENCOUNTER — Telehealth: Payer: Self-pay | Admitting: Hematology

## 2014-07-09 NOTE — Telephone Encounter (Signed)
, °

## 2014-07-15 ENCOUNTER — Other Ambulatory Visit: Payer: Self-pay | Admitting: Hematology

## 2014-07-15 DIAGNOSIS — Z1231 Encounter for screening mammogram for malignant neoplasm of breast: Secondary | ICD-10-CM

## 2014-07-18 DIAGNOSIS — Z9221 Personal history of antineoplastic chemotherapy: Secondary | ICD-10-CM

## 2014-07-18 DIAGNOSIS — Z923 Personal history of irradiation: Secondary | ICD-10-CM

## 2014-07-18 HISTORY — DX: Personal history of antineoplastic chemotherapy: Z92.21

## 2014-07-18 HISTORY — DX: Personal history of irradiation: Z92.3

## 2014-07-29 ENCOUNTER — Ambulatory Visit: Payer: Medicare HMO

## 2014-07-29 ENCOUNTER — Other Ambulatory Visit: Payer: Medicare HMO

## 2014-08-04 ENCOUNTER — Telehealth: Payer: Self-pay | Admitting: Hematology

## 2014-08-04 ENCOUNTER — Other Ambulatory Visit (HOSPITAL_BASED_OUTPATIENT_CLINIC_OR_DEPARTMENT_OTHER): Payer: Medicare HMO

## 2014-08-04 ENCOUNTER — Ambulatory Visit (HOSPITAL_BASED_OUTPATIENT_CLINIC_OR_DEPARTMENT_OTHER): Payer: Medicare HMO | Admitting: Hematology

## 2014-08-04 ENCOUNTER — Encounter: Payer: Self-pay | Admitting: Hematology

## 2014-08-04 VITALS — BP 134/69 | HR 84 | Temp 98.1°F | Resp 18 | Ht 63.0 in | Wt 189.5 lb

## 2014-08-04 DIAGNOSIS — C189 Malignant neoplasm of colon, unspecified: Secondary | ICD-10-CM

## 2014-08-04 DIAGNOSIS — C7931 Secondary malignant neoplasm of brain: Secondary | ICD-10-CM

## 2014-08-04 DIAGNOSIS — Z85038 Personal history of other malignant neoplasm of large intestine: Secondary | ICD-10-CM

## 2014-08-04 LAB — CBC WITH DIFFERENTIAL/PLATELET
BASO%: 1.1 % (ref 0.0–2.0)
BASOS ABS: 0.1 10*3/uL (ref 0.0–0.1)
EOS ABS: 0.1 10*3/uL (ref 0.0–0.5)
EOS%: 1.3 % (ref 0.0–7.0)
HCT: 43.3 % (ref 34.8–46.6)
HEMOGLOBIN: 13.7 g/dL (ref 11.6–15.9)
LYMPH%: 24.9 % (ref 14.0–49.7)
MCH: 27.2 pg (ref 25.1–34.0)
MCHC: 31.7 g/dL (ref 31.5–36.0)
MCV: 85.8 fL (ref 79.5–101.0)
MONO#: 0.8 10*3/uL (ref 0.1–0.9)
MONO%: 8.1 % (ref 0.0–14.0)
NEUT%: 64.6 % (ref 38.4–76.8)
NEUTROS ABS: 6.3 10*3/uL (ref 1.5–6.5)
PLATELETS: 230 10*3/uL (ref 145–400)
RBC: 5.05 10*6/uL (ref 3.70–5.45)
RDW: 14.6 % — AB (ref 11.2–14.5)
WBC: 9.7 10*3/uL (ref 3.9–10.3)
lymph#: 2.4 10*3/uL (ref 0.9–3.3)

## 2014-08-04 LAB — COMPREHENSIVE METABOLIC PANEL (CC13)
ALBUMIN: 3.7 g/dL (ref 3.5–5.0)
ALT: 33 U/L (ref 0–55)
AST: 26 U/L (ref 5–34)
Alkaline Phosphatase: 127 U/L (ref 40–150)
Anion Gap: 9 mEq/L (ref 3–11)
BUN: 18.1 mg/dL (ref 7.0–26.0)
CHLORIDE: 106 meq/L (ref 98–109)
CO2: 29 mEq/L (ref 22–29)
CREATININE: 0.8 mg/dL (ref 0.6–1.1)
Calcium: 9.6 mg/dL (ref 8.4–10.4)
Glucose: 86 mg/dl (ref 70–140)
Potassium: 4.1 mEq/L (ref 3.5–5.1)
Sodium: 144 mEq/L (ref 136–145)
Total Bilirubin: 0.54 mg/dL (ref 0.20–1.20)
Total Protein: 7.4 g/dL (ref 6.4–8.3)

## 2014-08-04 MED ORDER — HEPARIN SOD (PORK) LOCK FLUSH 100 UNIT/ML IV SOLN
500.0000 [IU] | Freq: Once | INTRAVENOUS | Status: AC
Start: 2014-08-04 — End: 2014-08-04
  Administered 2014-08-04: 500 [IU] via INTRAVENOUS
  Filled 2014-08-04: qty 5

## 2014-08-04 MED ORDER — SODIUM CHLORIDE 0.9 % IJ SOLN
10.0000 mL | INTRAMUSCULAR | Status: DC | PRN
  Administered 2014-08-04: 10 mL via INTRAVENOUS
  Filled 2014-08-04: qty 10

## 2014-08-04 NOTE — Progress Notes (Signed)
Hematology and Oncology Follow Up Visit Date of Visit: 04/28/2014  Kayla [name] [id number] 09/30/1950 63 y.o. 08/04/2014 1:21 PM  PCP: [facility] [phone])  Principle Diagnosis: Colon cancer, T4N1M1, Stage IV, diagnosed on 10/27/2008  Prior Therapy:  Kayla [name] underwent surgical resection of her tumor with a colostomy at the time of her surgery on 10/27/2008. The colostomy has been subsequently reversed on 09/20/2010. Kayla [name] received chemotherapy consisting of FOLFOX for 10 treatments from 12/10/2008 through 06/02/2009. She achieved a partial remission from these treatments. She then received additional chemotherapy with 5-FU, leucovorin, and 5-FU by continuous infusion along with Avastin from 06/23/2009 through 11/17/2009. 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   Current therapy:  Observation  Interim History:  She was last seen by Dr Sehbai 3 month ago. She is here for followup of her metastatic colon cancer with recurrence in the left parietal region of the brain.   She is doing well overall. She has some residual tingling on feet, R>L, she is on neurontin, she has some right frank pain which related position and exertion, which is a mild and bearable. No other pain, cough, GI symptoms. She feels well overall. No weight loss.   Past Medical History  Diagnosis Date  . Pericardial effusion     echo 08/13/09  . LVH (left ventricular hypertrophy)     mod/severe. echo 1/11. EF 65-70%   . Hypertension   . Thrombocytopenia   . 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 Allergy     sulfa  . Colon cancer     colon/ 2010/surg/chemo  . History of radiation therapy 04/15/11    17 Gy single fraction  l parietal brain metastais  . Heart murmur   . Peripheral vascular disease   . Shortness of breath   . Blood transfusion   . GERD (gastroesophageal reflux disease)   . Headache(784.0)   . Neuromuscular disorder    peripheral neuropathy feet  . Arthritis   . Diverticulosis 07/22/2010    sigmoid colon     Medications: I have reviewed the patient's current medications.  Current Outpatient Prescriptions  Medication Sig Dispense Refill  . acetaminophen (TYLENOL) 500 MG tablet Take 1,000 mg by mouth every 6 (six) hours as needed.    . gabapentin (NEURONTIN) 300 MG capsule TAKE ONE CAPSULE BY MOUTH AT BEDTIME 90 capsule 0  . ibuprofen (ADVIL,MOTRIN) 200 MG tablet Take 200 mg by mouth every 8 (eight) hours as needed. For pain    . lisinopril (PRINIVIL,ZESTRIL) 40 MG tablet Take 1 tablet (40 mg total) by mouth daily. 90 tablet 0   No current facility-administered medications for this visit.   Allergies:  Allergies  Allergen Reactions  . Sulfonamide Derivatives Rash   Oncology History: Diagnosis of colon cancer dates back to 10/27/2008 when Laniece presented with what turned out to be a bowel perforation through tumor. Stage at that time was T4 N1 M1 with pulmonary metastatic disease. The K-ras mutation was detected. Kayla [name] underwent surgical resection of her tumor with a colostomy at the time of her surgery on 10/27/2008. The colostomy has been subsequently reversed on 09/20/2010. Kayla [name] received chemotherapy consisting of FOLFOX for 10 treatments from 12/10/2008 through 06/02/2009. She achieved a partial remission from these treatments. She then received additional chemotherapy with 5-FU, leucovorin, and 5-FU by continuous infusion along with Avastin from 06/23/2009 through 11/17/2009. 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   Hematology and Oncology Follow Up Visit Date of Visit: 04/28/2014  Kayla [name] [id number] 09/30/1950 63 y.o. 08/04/2014 1:21 PM  PCP: [facility] ([phone])  Principle Diagnosis: Colon cancer, T4N1M1, Stage IV, diagnosed on 10/27/2008  Prior Therapy:  Kayla [name] underwent surgical resection of her tumor with a colostomy at the time of her surgery on 10/27/2008. The colostomy has been subsequently reversed on 09/20/2010. Kayla [name] received chemotherapy consisting of FOLFOX for 10 treatments from 12/10/2008 through 06/02/2009. She achieved a partial remission from these treatments. She then received additional chemotherapy with 5-FU, leucovorin, and 5-FU by continuous infusion along with Avastin from 06/23/2009 through 11/17/2009. 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   Current therapy:  Observation  Interim History:  She was last seen by Dr Sehbai 3 month ago. She is here for followup of her metastatic colon cancer with recurrence in the left parietal region of the brain.   She is doing well overall. She has some residual tingling on feet, R>L, she is on neurontin, she has some right frank pain which related position and exertion, which is a mild and bearable. No other pain, cough, GI symptoms. She feels well overall. No weight loss.   Past Medical History  Diagnosis Date  . Pericardial effusion     echo 08/13/09  . LVH (left ventricular hypertrophy)     mod/severe. echo 1/11. EF 65-70%   . Hypertension   . Thrombocytopenia   . 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 Allergy     sulfa  . Colon cancer     colon/ 2010/surg/chemo  . History of radiation therapy 04/15/11    17 Gy single fraction  l parietal brain metastais  . Heart murmur   . Peripheral vascular disease   . Shortness of breath   . Blood transfusion   . GERD (gastroesophageal reflux disease)   . Headache(784.0)   . Neuromuscular disorder    peripheral neuropathy feet  . Arthritis   . Diverticulosis 07/22/2010    sigmoid colon     Medications: I have reviewed the patient's current medications.  Current Outpatient Prescriptions  Medication Sig Dispense Refill  . acetaminophen (TYLENOL) 500 MG tablet Take 1,000 mg by mouth every 6 (six) hours as needed.    . gabapentin (NEURONTIN) 300 MG capsule TAKE ONE CAPSULE BY MOUTH AT BEDTIME 90 capsule 0  . ibuprofen (ADVIL,MOTRIN) 200 MG tablet Take 200 mg by mouth every 8 (eight) hours as needed. For pain    . lisinopril (PRINIVIL,ZESTRIL) 40 MG tablet Take 1 tablet (40 mg total) by mouth daily. 90 tablet 0   No current facility-administered medications for this visit.   Allergies:  Allergies  Allergen Reactions  . Sulfonamide Derivatives Rash   Oncology History: Diagnosis of colon cancer dates back to 10/27/2008 when Laniece presented with what turned out to be a bowel perforation through tumor. Stage at that time was T4 N1 M1 with pulmonary metastatic disease. The K-ras mutation was detected. Kayla [name] underwent surgical resection of her tumor with a colostomy at the time of her surgery on 10/27/2008. The colostomy has been subsequently reversed on 09/20/2010. Kayla [name] received chemotherapy consisting of FOLFOX for 10 treatments from 12/10/2008 through 06/02/2009. She achieved a partial remission from these treatments. She then received additional chemotherapy with 5-FU, leucovorin, and 5-FU by continuous infusion along with Avastin from 06/23/2009 through 11/17/2009. 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   Hematology and Oncology Follow Up Visit Date of Visit: 04/28/2014  Kayla [name] [id number] 09/30/1950 63 y.o. 08/04/2014 1:21 PM  PCP: [facility] ([phone])  Principle Diagnosis: Colon cancer, T4N1M1, Stage IV, diagnosed on 10/27/2008  Prior Therapy:  Kayla [name] underwent surgical resection of her tumor with a colostomy at the time of her surgery on 10/27/2008. The colostomy has been subsequently reversed on 09/20/2010. Kayla [name] received chemotherapy consisting of FOLFOX for 10 treatments from 12/10/2008 through 06/02/2009. She achieved a partial remission from these treatments. She then received additional chemotherapy with 5-FU, leucovorin, and 5-FU by continuous infusion along with Avastin from 06/23/2009 through 11/17/2009. 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   Current therapy:  Observation  Interim History:  She was last seen by Dr Sehbai 3 month ago. She is here for followup of her metastatic colon cancer with recurrence in the left parietal region of the brain.   She is doing well overall. She has some residual tingling on feet, R>L, she is on neurontin, she has some right frank pain which related position and exertion, which is a mild and bearable. No other pain, cough, GI symptoms. She feels well overall. No weight loss.   Past Medical History  Diagnosis Date  . Pericardial effusion     echo 08/13/09  . LVH (left ventricular hypertrophy)     mod/severe. echo 1/11. EF 65-70%   . Hypertension   . Thrombocytopenia   . 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 Allergy     sulfa  . Colon cancer     colon/ 2010/surg/chemo  . History of radiation therapy 04/15/11    17 Gy single fraction  l parietal brain metastais  . Heart murmur   . Peripheral vascular disease   . Shortness of breath   . Blood transfusion   . GERD (gastroesophageal reflux disease)   . Headache(784.0)   . Neuromuscular disorder    peripheral neuropathy feet  . Arthritis   . Diverticulosis 07/22/2010    sigmoid colon     Medications: I have reviewed the patient's current medications.  Current Outpatient Prescriptions  Medication Sig Dispense Refill  . acetaminophen (TYLENOL) 500 MG tablet Take 1,000 mg by mouth every 6 (six) hours as needed.    . gabapentin (NEURONTIN) 300 MG capsule TAKE ONE CAPSULE BY MOUTH AT BEDTIME 90 capsule 0  . ibuprofen (ADVIL,MOTRIN) 200 MG tablet Take 200 mg by mouth every 8 (eight) hours as needed. For pain    . lisinopril (PRINIVIL,ZESTRIL) 40 MG tablet Take 1 tablet (40 mg total) by mouth daily. 90 tablet 0   No current facility-administered medications for this visit.   Allergies:  Allergies  Allergen Reactions  . Sulfonamide Derivatives Rash   Oncology History: Diagnosis of colon cancer dates back to 10/27/2008 when Laniece presented with what turned out to be a bowel perforation through tumor. Stage at that time was T4 N1 M1 with pulmonary metastatic disease. The K-ras mutation was detected. Kayla [name] underwent surgical resection of her tumor with a colostomy at the time of her surgery on 10/27/2008. The colostomy has been subsequently reversed on 09/20/2010. Kayla [name] received chemotherapy consisting of FOLFOX for 10 treatments from 12/10/2008 through 06/02/2009. She achieved a partial remission from these treatments. She then received additional chemotherapy with 5-FU, leucovorin, and 5-FU by continuous infusion along with Avastin from 06/23/2009 through 11/17/2009. 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   Hematology and Oncology Follow Up Visit Date of Visit: 04/28/2014  Kayla [name] [id number] 09/30/1950 63 y.o. 08/04/2014 1:21 PM  PCP: [facility] ([phone])  Principle Diagnosis: Colon cancer, T4N1M1, Stage IV, diagnosed on 10/27/2008  Prior Therapy:  Kayla [name] underwent surgical resection of her tumor with a colostomy at the time of her surgery on 10/27/2008. The colostomy has been subsequently reversed on 09/20/2010. Kayla [name] received chemotherapy consisting of FOLFOX for 10 treatments from 12/10/2008 through 06/02/2009. She achieved a partial remission from these treatments. She then received additional chemotherapy with 5-FU, leucovorin, and 5-FU by continuous infusion along with Avastin from 06/23/2009 through 11/17/2009. 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   Current therapy:  Observation  Interim History:  She was last seen by Dr Sehbai 3 month ago. She is here for followup of her metastatic colon cancer with recurrence in the left parietal region of the brain.   She is doing well overall. She has some residual tingling on feet, R>L, she is on neurontin, she has some right frank pain which related position and exertion, which is a mild and bearable. No other pain, cough, GI symptoms. She feels well overall. No weight loss.   Past Medical History  Diagnosis Date  . Pericardial effusion     echo 08/13/09  . LVH (left ventricular hypertrophy)     mod/severe. echo 1/11. EF 65-70%   . Hypertension   . Thrombocytopenia   . 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 Allergy     sulfa  . Colon cancer     colon/ 2010/surg/chemo  . History of radiation therapy 04/15/11    17 Gy single fraction  l parietal brain metastais  . Heart murmur   . Peripheral vascular disease   . Shortness of breath   . Blood transfusion   . GERD (gastroesophageal reflux disease)   . Headache(784.0)   . Neuromuscular disorder    peripheral neuropathy feet  . Arthritis   . Diverticulosis 07/22/2010    sigmoid colon     Medications: I have reviewed the patient's current medications.  Current Outpatient Prescriptions  Medication Sig Dispense Refill  . acetaminophen (TYLENOL) 500 MG tablet Take 1,000 mg by mouth every 6 (six) hours as needed.    . gabapentin (NEURONTIN) 300 MG capsule TAKE ONE CAPSULE BY MOUTH AT BEDTIME 90 capsule 0  . ibuprofen (ADVIL,MOTRIN) 200 MG tablet Take 200 mg by mouth every 8 (eight) hours as needed. For pain    . lisinopril (PRINIVIL,ZESTRIL) 40 MG tablet Take 1 tablet (40 mg total) by mouth daily. 90 tablet 0   No current facility-administered medications for this visit.   Allergies:  Allergies  Allergen Reactions  . Sulfonamide Derivatives Rash   Oncology History: Diagnosis of colon cancer dates back to 10/27/2008 when Laniece presented with what turned out to be a bowel perforation through tumor. Stage at that time was T4 N1 M1 with pulmonary metastatic disease. The K-ras mutation was detected. Kayla [name] underwent surgical resection of her tumor with a colostomy at the time of her surgery on 10/27/2008. The colostomy has been subsequently reversed on 09/20/2010. Kayla [name] received chemotherapy consisting of FOLFOX for 10 treatments from 12/10/2008 through 06/02/2009. She achieved a partial remission from these treatments. She then received additional chemotherapy with 5-FU, leucovorin, and 5-FU by continuous infusion along with Avastin from 06/23/2009 through 11/17/2009. 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   Hematology and Oncology Follow Up Visit Date of Visit: 04/28/2014  Kayla [name] [id number] 09/30/1950 63 y.o. 08/04/2014 1:21 PM  PCP: [facility] ([phone])  Principle Diagnosis: Colon cancer, T4N1M1, Stage IV, diagnosed on 10/27/2008  Prior Therapy:  Kayla [name] underwent surgical resection of her tumor with a colostomy at the time of her surgery on 10/27/2008. The colostomy has been subsequently reversed on 09/20/2010. Kayla [name] received chemotherapy consisting of FOLFOX for 10 treatments from 12/10/2008 through 06/02/2009. She achieved a partial remission from these treatments. She then received additional chemotherapy with 5-FU, leucovorin, and 5-FU by continuous infusion along with Avastin from 06/23/2009 through 11/17/2009. 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   Current therapy:  Observation  Interim History:  She was last seen by Dr Sehbai 3 month ago. She is here for followup of her metastatic colon cancer with recurrence in the left parietal region of the brain.   She is doing well overall. She has some residual tingling on feet, R>L, she is on neurontin, she has some right frank pain which related position and exertion, which is a mild and bearable. No other pain, cough, GI symptoms. She feels well overall. No weight loss.   Past Medical History  Diagnosis Date  . Pericardial effusion     echo 08/13/09  . LVH (left ventricular hypertrophy)     mod/severe. echo 1/11. EF 65-70%   . Hypertension   . Thrombocytopenia   . 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 Allergy     sulfa  . Colon cancer     colon/ 2010/surg/chemo  . History of radiation therapy 04/15/11    17 Gy single fraction  l parietal brain metastais  . Heart murmur   . Peripheral vascular disease   . Shortness of breath   . Blood transfusion   . GERD (gastroesophageal reflux disease)   . Headache(784.0)   . Neuromuscular disorder    peripheral neuropathy feet  . Arthritis   . Diverticulosis 07/22/2010    sigmoid colon     Medications: I have reviewed the patient's current medications.  Current Outpatient Prescriptions  Medication Sig Dispense Refill  . acetaminophen (TYLENOL) 500 MG tablet Take 1,000 mg by mouth every 6 (six) hours as needed.    . gabapentin (NEURONTIN) 300 MG capsule TAKE ONE CAPSULE BY MOUTH AT BEDTIME 90 capsule 0  . ibuprofen (ADVIL,MOTRIN) 200 MG tablet Take 200 mg by mouth every 8 (eight) hours as needed. For pain    . lisinopril (PRINIVIL,ZESTRIL) 40 MG tablet Take 1 tablet (40 mg total) by mouth daily. 90 tablet 0   No current facility-administered medications for this visit.   Allergies:  Allergies  Allergen Reactions  . Sulfonamide Derivatives Rash   Oncology History: Diagnosis of colon cancer dates back to 10/27/2008 when Laniece presented with what turned out to be a bowel perforation through tumor. Stage at that time was T4 N1 M1 with pulmonary metastatic disease. The K-ras mutation was detected. Kayla [name] underwent surgical resection of her tumor with a colostomy at the time of her surgery on 10/27/2008. The colostomy has been subsequently reversed on 09/20/2010. Kayla [name] received chemotherapy consisting of FOLFOX for 10 treatments from 12/10/2008 through 06/02/2009. She achieved a partial remission from these treatments. She then received additional chemotherapy with 5-FU, leucovorin, and 5-FU by continuous infusion along with Avastin from 06/23/2009 through 11/17/2009. 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

## 2014-08-04 NOTE — Telephone Encounter (Signed)
Gave avs & cal for Feb/April.

## 2014-08-05 LAB — CEA: CEA: 1.1 ng/mL (ref 0.0–5.0)

## 2014-08-11 ENCOUNTER — Ambulatory Visit (HOSPITAL_COMMUNITY)
Admission: RE | Admit: 2014-08-11 | Discharge: 2014-08-11 | Disposition: A | Discharge status: Home / Self Care | Payer: Medicare HMO | Source: Ambulatory Visit | Attending: Hematology | Admitting: Hematology

## 2014-08-11 DIAGNOSIS — Z1231 Encounter for screening mammogram for malignant neoplasm of breast: Secondary | ICD-10-CM | POA: Insufficient documentation

## 2014-09-04 ENCOUNTER — Other Ambulatory Visit: Payer: Self-pay | Admitting: Radiation Therapy

## 2014-09-04 DIAGNOSIS — C7931 Secondary malignant neoplasm of brain: Secondary | ICD-10-CM

## 2014-09-15 ENCOUNTER — Ambulatory Visit (HOSPITAL_BASED_OUTPATIENT_CLINIC_OR_DEPARTMENT_OTHER): Payer: Medicare HMO

## 2014-09-15 VITALS — BP 147/79 | HR 90 | Temp 97.4°F

## 2014-09-15 DIAGNOSIS — Z452 Encounter for adjustment and management of vascular access device: Secondary | ICD-10-CM

## 2014-09-15 DIAGNOSIS — C7931 Secondary malignant neoplasm of brain: Secondary | ICD-10-CM

## 2014-09-15 DIAGNOSIS — Z95828 Presence of other vascular implants and grafts: Secondary | ICD-10-CM

## 2014-09-15 MED ORDER — HEPARIN SOD (PORK) LOCK FLUSH 100 UNIT/ML IV SOLN
500.0000 [IU] | Freq: Once | INTRAVENOUS | Status: AC
  Administered 2014-09-15: 500 [IU] via INTRAVENOUS
  Filled 2014-09-15: qty 5

## 2014-09-15 MED ORDER — SODIUM CHLORIDE 0.9 % IJ SOLN
10.0000 mL | INTRAMUSCULAR | Status: DC | PRN
  Administered 2014-09-15: 10 mL via INTRAVENOUS
  Filled 2014-09-15: qty 10

## 2014-09-15 NOTE — Patient Instructions (Signed)

## 2014-10-28 ENCOUNTER — Encounter: Payer: Self-pay | Admitting: Radiation Oncology

## 2014-10-28 NOTE — Progress Notes (Signed)
[id number] for MRI Brain [id number], 4/11-7/04/2015 per [facility]

## 2014-11-03 ENCOUNTER — Encounter: Payer: Self-pay | Admitting: Hematology

## 2014-11-03 ENCOUNTER — Telehealth: Payer: Self-pay | Admitting: Hematology

## 2014-11-03 ENCOUNTER — Other Ambulatory Visit (HOSPITAL_BASED_OUTPATIENT_CLINIC_OR_DEPARTMENT_OTHER): Payer: Medicare HMO

## 2014-11-03 ENCOUNTER — Ambulatory Visit (HOSPITAL_BASED_OUTPATIENT_CLINIC_OR_DEPARTMENT_OTHER): Payer: Medicare HMO

## 2014-11-03 ENCOUNTER — Ambulatory Visit (HOSPITAL_BASED_OUTPATIENT_CLINIC_OR_DEPARTMENT_OTHER): Payer: Medicare HMO | Admitting: Hematology

## 2014-11-03 VITALS — BP 124/70 | HR 79 | Temp 97.7°F | Resp 19 | Ht 63.0 in | Wt 189.3 lb

## 2014-11-03 DIAGNOSIS — Z85038 Personal history of other malignant neoplasm of large intestine: Secondary | ICD-10-CM

## 2014-11-03 DIAGNOSIS — Z9221 Personal history of antineoplastic chemotherapy: Secondary | ICD-10-CM

## 2014-11-03 DIAGNOSIS — G620 Drug-induced polyneuropathy: Secondary | ICD-10-CM | POA: Diagnosis not present

## 2014-11-03 DIAGNOSIS — C7931 Secondary malignant neoplasm of brain: Secondary | ICD-10-CM

## 2014-11-03 DIAGNOSIS — Z95828 Presence of other vascular implants and grafts: Secondary | ICD-10-CM

## 2014-11-03 DIAGNOSIS — C189 Malignant neoplasm of colon, unspecified: Secondary | ICD-10-CM

## 2014-11-03 LAB — COMPREHENSIVE METABOLIC PANEL (CC13)
ALBUMIN: 3.7 g/dL (ref 3.5–5.0)
ALK PHOS: 109 U/L (ref 40–150)
ALT: 27 U/L (ref 0–55)
ANION GAP: 11 meq/L (ref 3–11)
AST: 26 U/L (ref 5–34)
BILIRUBIN TOTAL: 0.71 mg/dL (ref 0.20–1.20)
BUN: 12 mg/dL (ref 7.0–26.0)
CO2: 26 meq/L (ref 22–29)
Calcium: 9.2 mg/dL (ref 8.4–10.4)
Chloride: 104 mEq/L (ref 98–109)
Creatinine: 0.8 mg/dL (ref 0.6–1.1)
EGFR: 88 mL/min/{1.73_m2} — AB (ref >90)
GLUCOSE: 92 mg/dL (ref 70–140)
Potassium: 4.1 mEq/L (ref 3.5–5.1)
SODIUM: 142 meq/L (ref 136–145)
Total Protein: 7.3 g/dL (ref 6.4–8.3)

## 2014-11-03 LAB — CBC WITH DIFFERENTIAL/PLATELET
BASO%: 0.7 % (ref 0.0–2.0)
Basophils Absolute: 0.1 10*3/uL (ref 0.0–0.1)
EOS%: 2.5 % (ref 0.0–7.0)
Eosinophils Absolute: 0.2 10*3/uL (ref 0.0–0.5)
HCT: 44.1 % (ref 34.8–46.6)
HGB: 14.1 g/dL (ref 11.6–15.9)
LYMPH#: 2.1 10*3/uL (ref 0.9–3.3)
LYMPH%: 27.4 % (ref 14.0–49.7)
MCH: 27 pg (ref 25.1–34.0)
MCHC: 31.9 g/dL (ref 31.5–36.0)
MCV: 84.6 fL (ref 79.5–101.0)
MONO#: 0.5 10*3/uL (ref 0.1–0.9)
MONO%: 6.9 % (ref 0.0–14.0)
NEUT#: 4.9 10*3/uL (ref 1.5–6.5)
NEUT%: 62.5 % (ref 38.4–76.8)
Platelets: 212 10*3/uL (ref 145–400)
RBC: 5.21 10*6/uL (ref 3.70–5.45)
RDW: 14.5 % (ref 11.2–14.5)
WBC: 7.8 10*3/uL (ref 3.9–10.3)

## 2014-11-03 MED ORDER — SODIUM CHLORIDE 0.9 % IJ SOLN
10.0000 mL | INTRAMUSCULAR | Status: DC | PRN
  Administered 2014-11-03: 10 mL via INTRAVENOUS
  Filled 2014-11-03: qty 10

## 2014-11-03 MED ORDER — HEPARIN SOD (PORK) LOCK FLUSH 100 UNIT/ML IV SOLN
500.0000 [IU] | Freq: Once | INTRAVENOUS | Status: AC
  Administered 2014-11-03: 500 [IU] via INTRAVENOUS
  Filled 2014-11-03: qty 5

## 2014-11-03 NOTE — Patient Instructions (Signed)

## 2014-11-03 NOTE — Addendum Note (Signed)
Addended by: Truitt Merle on: 11/03/2014 04:40 PM   Modules accepted: Orders

## 2014-11-03 NOTE — Progress Notes (Signed)
Hematology and Oncology Follow Up Visit Date of Visit: 04/28/2014  Kayla [name] [id number] [birth year] 64 y.o. 11/03/2014 10:59 AM  PCP: [facility] [phone])  Principle Diagnosis: Colon cancer, T4N1M1, Stage IV, diagnosed on 10/27/2008  Prior Therapy:  Kayla [name] underwent surgical resection of her tumor with a colostomy at the time of her surgery on 10/27/2008. The colostomy has been subsequently reversed on 09/20/2010. Kayla [name] received chemotherapy consisting of FOLFOX for 10 treatments from 12/10/2008 through 06/02/2009. She achieved a partial remission from these treatments. She then received additional chemotherapy with 5-FU, leucovorin, and 5-FU by continuous infusion along with Avastin from 06/23/2009 through 11/17/2009. 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   Current therapy:  Observation  Interim History:  She was last seen by me 3 month ago. She is here for followup of her metastatic colon cancer with recurrence in the left parietal region of the brain in 03/2011.   She is doing well overall. She still has some right foot numbness, which is stable. She otherwise denies any other symptoms, no pain, abdominal bloating, nausea or change of her bowel habits. She has good energy level and appetite. No weight loss.  Past Medical History  Diagnosis Date  . Pericardial effusion     echo 08/13/09  . LVH (left ventricular hypertrophy)     mod/severe. echo 1/11. EF 65-70%   . Hypertension   . Thrombocytopenia   . 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 Allergy     sulfa  . Colon cancer     colon/ 2010/surg/chemo  . History of radiation therapy 04/15/11    17 Gy single fraction  l parietal brain metastais  . Heart murmur   . Peripheral vascular disease   . Shortness of breath   . Blood transfusion   . GERD (gastroesophageal reflux disease)   . Headache(784.0)   . Neuromuscular disorder     peripheral neuropathy  feet  . Arthritis   . Diverticulosis 07/22/2010    sigmoid colon     Medications: I have reviewed the patient's current medications.  Current Outpatient Prescriptions  Medication Sig Dispense Refill  . acetaminophen (TYLENOL) 500 MG tablet Take 1,000 mg by mouth every 6 (six) hours as needed.    . gabapentin (NEURONTIN) 300 MG capsule TAKE ONE CAPSULE BY MOUTH AT BEDTIME 90 capsule 0  . ibuprofen (ADVIL,MOTRIN) 200 MG tablet Take 200 mg by mouth every 8 (eight) hours as needed. For pain    . lisinopril (PRINIVIL,ZESTRIL) 40 MG tablet Take 1 tablet (40 mg total) by mouth daily. 90 tablet 0  . fluticasone (FLONASE) 50 MCG/ACT nasal spray Place 1 spray into both nostrils daily.    [name] lidocaine-prilocaine (EMLA) cream Apply topically as needed.     No current facility-administered medications for this visit.   Allergies:  Allergies  Allergen Reactions  . Sulfonamide Derivatives Rash   Oncology History: Diagnosis of colon cancer dates back to 10/27/2008 when Kayla [name] presented with what turned out to be a bowel perforation through tumor. Stage at that time was T4 N1 M1 with pulmonary metastatic disease. The K-ras mutation was detected. Kayla [name] underwent surgical resection of her tumor with a colostomy at the time of her surgery on 10/27/2008. The colostomy has been subsequently reversed on 09/20/2010. Kayla [name] received chemotherapy consisting of FOLFOX for 10 treatments from 12/10/2008 through 06/02/2009. She achieved a partial remission from these treatments. She then received additional chemotherapy with  5-FU, leucovorin, and 5-FU by continuous infusion along with Avastin from 06/23/2009 through 11/17/2009. 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 She was doing well without evidence of disease until she developed a right hemiparesthesia in September 2012 and was found to have a 3 x 3 x 2.4 cm, lobulated, enhancing mass in the left parietal lobe with marked  surrounding edema. A PET scan on 03/25/2011 showed resolution of the previously identified pulmonary nodules with no residual hypermetabolic activity. The pericardial effusion, which has been present since diagnosis, was unchanged. There were also uterine fibroids and a low-density lesion in the anterior spleen that was stable and not associated with any hypermetabolic activity. Of note, Kayla [name] had a markedly elevated CEA up to 24.7 on 03/23/2011. On 06/23/2011, the CEA was less than 0.5. Dr. Erline Levine resected the recurrent metastatic colon cancer on 03/31/2011. This was followed by stereotactic radiation on 04/15/2011. The patient underwent a left-sided craniotomy and excision of the mass involving her left parietal lobe on 11/10/2011 by Dr. Erline Levine. The pathology report was negative for any malignancy. Pathology report indicated benign brain with fibrosis, hemorrhage, hemosiderin deposition, abundant dystrophic calcifications, and mixed acute and chronic inflammation including foreign body multinucleated giant cells.    Past Medical History, Surgical history, Social history, and Family History were reviewed and updated.  SMOKING HISTORY:  The patient has smoked intermittently, about 2 packs of cigarettes per week, since she was a teenager, but stopped smoking in 2010.  Review of Systems: Constitutional:  Negative for fever, chills, night sweats, anorexia, weight loss, pain. Cardiovascular: no chest pain or dyspnea on exertion Respiratory: no cough, shortness of breath, or wheezing Neurological: no TIA or stroke symptoms Dermatological: negative for rash ENT: negative for - epistaxis, headaches, tinnitus, vertigo or visual changes Skin: Negative. Gastrointestinal: no abdominal pain, change in bowel habits, or black or bloody stools positive for - heartburn Genito-Urinary: no dysuria, trouble voiding, or hematuria Hematological and Lymphatic: negative for - bleeding problems, fatigue,  jaundice or pallor Breast: negative for breast lumps Musculoskeletal: positive for - muscular weakness and pins and needles with some numbness (R>L) in lower extremities negative for - gait disturbance Remaining ROS negative. Physical Exam: Blood pressure 124/70, pulse 79, temperature 97.7 F (36.5 C), temperature source Oral, resp. rate 19, height 5' 3"  (1.6 m), weight 189 lb 4.8 oz (85.866 kg), SpO2 99 %. ECOG: 0/1 General appearance: alert, cooperative, appears stated age, no distress and mildly obese Head: Normocephalic, without obvious abnormality, atraumatic Neck: no adenopathy, supple, symmetrical, trachea midline and thyroid not enlarged, symmetric, no tenderness/mass/nodules HEENT: PERRLA; EOMi; No sclerae icterus. OP clear of masses.  Lymph nodes: Cervical, supraclavicular, and axillary nodes normal. Heart:regular rate and rhythm, S1, S2 normal, no murmur, click, rub or gallop Lung:chest clear, no wheezing, rales, normal symmetric air entry,  + R sided port-a-cath. Abdomin: soft, non-tender, without masses or organomegaly, normal bowel sounds and surgical scars well healed. EXT:No peripheral edema.  R>L decreased sensation and mildly decreased strength (noted in prior exams) Neuro: R lower extremity weakness and decreased sensation.  Normal gait.  Good finger to nose bilaterally. Otherwise no focal deficits.   Lab Results: CBC Latest Ref Rng 11/03/2014 08/04/2014 04/28/2014  WBC 3.9 - 10.3 10e3/uL 7.8 9.7 7.9  Hemoglobin 11.6 - 15.9 g/dL 14.1 13.7 14.6  Hematocrit 34.8 - 46.6 % 44.1 43.3 45.4  Platelets 145 - 400 10e3/uL 212 230 219    CMP Latest Ref Rng 11/03/2014 08/04/2014 06/27/2014  Glucose 70 - 140 mg/dl 92 86 -  BUN 7.0 - 26.0 mg/dL 12.0 18.1 11.1  Creatinine 0.6 - 1.1 mg/dL 0.8 0.8 0.9  Sodium 136 - 145 mEq/L 142 144 -  Potassium 3.5 - 5.1 mEq/L 4.1 4.1 -  Chloride 98 - 107 mEq/L - - -  CO2 22 - 29 mEq/L 26 29 -  Calcium 8.4 - 10.4 mg/dL 9.2 9.6 -  Total Protein  6.4 - 8.3 g/dL 7.3 7.4 -  Albumin 3.3 - 5.5 g/dL - - -  Total Bilirubin 0.20 - 1.20 mg/dL 0.71 0.54 -  Alkaline Phos 40 - 150 U/L 109 127 -  AST 5 - 34 U/L 26 26 -  ALT 0 - 55 U/L 27 33 -   CEA  Status: Finalresult Visible to patient:  Not Released Nextappt: 08/11/2014 at 09:40 AM in Radiology ([facility]-MM 1) Dx:  Metastatic cancer to brain           Ref Range 90moago  610mogo  84m684moo  42yr75yr     CEA 0.0 - 5.0 ng/mL 0.9 1.3 1.4 1.1       Radiological Studies: 1. MRI of the head with and without IV contrast on 03/23/2011 showed a solitary enhancing mass in the left parietal lobe with surrounding white matter edema, most compatible with solitary metastatic deposit. The mass lesion measured 2.7 x 2.5 cm.  2. PET scan from 03/25/2011 showed resolution of the previously- identified pulmonary nodules with no residual hypermetabolic activity noted in the neck, chest, abdomen or pelvis to suggest active malignancy. There was a pericardial effusion and some subsegmental atelectasis in the lower lobes. There were also uterine fibroids and a low-density lesion in the anterior spleen that was stable and not associated with hypermetabolic activity.  3. MRI of the head with and without IV contrast showed Stealth protocol utilized to evaluate left parietal mass. The mass measured 3 x 3 x 2.4 cm with marked surrounding vasogenic edema.  4. MRI of the head with and without IV contrast on 04/08/2011 showed postsurgical changes of tumor removal in the left parietal lobe and a postsurgical hematoma with enhancement. There was 3 mm of midline shift. No other metastatic deposits.  5. MRI of the head with and without IV contrast on 06/28/2011 showed a 3 cm area of restricted diffusion lying within the previous area of  left parietal surgical resection for colon cancer. There was enhancement of the peripheral aspect of the cavity, marked restricted diffusion and increasing vasogenic edema. There  was concern about the development of a brain abscess.  6. MRI of the head with and without IV contrast on 07/29/2011 showed left parietal postoperative hematoma was smaller compared with the  prior study. There was no nodular enhancement seen to indicate tumor. No new enhancing lesions were seen. There was significant  improvement in the left parietal edema.  7. Digital screening mammogram was negative on 08/02/2011.  8. An MRI of the head with and without IV contrast on 10/21/2011 showed development of gyriform enhancement circumferentially along the margins of the left parietal postoperative space/hematoma. Marked increase in vasogenic edema was seen. 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 There was further contraction of the hematoma/postoperative space in the left parietal cortical region, now measuring 17 x 19 mm in transverse diameter as opposed to 22 x 21 mm previously.  Left-to-right shift was 2 mm.  9. Nuclear medicine brain PET-CT  scan carried out on 10/27/2011 showed hypermetabolic activity in the high left parietal lobe which corresponds to gyriform enhancement on comparison MRI from 10/21/2011. This was felt to be most consistent with recurrent  tumor.  10. Chest x-ray, 2 view from 11/04/2011, showed chronic cardiomegaly. Port-A-Cath was in place. No acute disease was present.  11. MRI of the head with and without IV contrast on 01/27/2012 showed findings that were felt to be consistent with progression of disease. This exam was compared with the MRI of 10/21/2011. It was recognized that the patient underwent surgical resection of the mass on 11/10/2011.  There was felt to be on the present exam residual peripheral enhancement of approximately 35 x 40 x 21 mm with infiltration of the cortex and deep white matter. It was felt that the enhancement had progressed, despite removal of the central focus. There was moderate vasogenic  edema  throughout the left hemisphere but slightly decreased from the MRI from 10/21/2011. No new lesions were seen. There was mild atrophy with chronic microvascular ischemic change, but no midline shift. Major intracranial vascular structures were patent.  12. MRI of the brain with and without IV contrast on 05/04/2012 showed previous left parietal lobe surgery for resection of tumor and radiation necrosis.  No new abnormalities were noted. 13. Chest x-ray, 2 view, on 07/03/2012 showed enlargement of the cardiac silhouette with some lingular scarring, as compared with the chest x-ray from 11/04/2011. 14. 2-D echocardiogram from 07/12/2012 showed left ventricular ejection fraction of 55-60%.  There was a small to moderate circumferential pericardial effusion with no signs of tamponade.  There was felt to be no significant change from the 2-D echocardiogram dated 08/13/2009. 15. Digital bilateral screening mammogram on 08/06/2012 was negative. 16. 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  There was no progression or new brain metastasis identified. 17. PET scan from 10/16/2012 showed no specific features to suggest metastatic disease.  There was diffuse uptake throughout the thyroid gland, possibly due to hyperthyroidism. 18. MRI of the brain with and without IV contrast on 03/12/2013 showed residual gyriform enhancement surrounding the area of previous surgical resection of a left parietal metastasis with subsequent resection of radionecrosis. The findings likely represent some residual brain injury, but there is no significant progression of radionecrosis and no new lesions seen. 19. MRI of the brain with and without IV contrast on 11/01/2013 showed Stable posttherapy appearance of the brain since May of 2014. No progression or new brain metastasis  identified. 20. CT chest,abomen and pelvis with contrast. 1. No evidence of thoracic metastasis.  2. Small prevascular lymph node is similar prior. 3. Stable pericardial effusion. 4. Interval increase in the volume presacral lymph node in the upper pelvis. This has progressed slowly from 2011. Recommend attention on follow-up versus restaging FDG PET scan. 5. Stable anastomosis in the descending colon. 21. CT chest, abdomen and pelvis with contrast 07/126/2015 (Personally reviewed by me). 1. No evidence of local colon cancer recurrence or metastasis within the abdomen or pelvis. 2. Retention of stool at the colocolonic anastomosis in the left colon is unchanged comparison exam. No obstructing lesion identified.  3. Mesenteric lymph node is again demonstrated with no increased size. 4. Stable splenic lesion likely representing a complex cysts or hemangioma. 5. Stable small pericardial effusion  Brain MRI 06/27/2014  IMPRESSION: Postop resection and radiation of left parietal metastatic  disease. This area is stable compared with prior imaging without evidence of recurrent tumor. No new lesions identified.  Impression and Plan:  1. Stage IV Colon Cancer mets to lung and brain.  --Kayla [name] continues to do well with no evidence for disease recurrence.  She is now out  6 years from the time of diagnosis and 3.5 years from the time of diagnosis of her right brain recurrence.   -She is clinically doing very well. Her CEA level has been normal, today's result is still pending. -She is scheduled to have a repeated the plain MRI later this months, follow by Dr. Tammi Klippel. Kayla [name] continue observation. Apparently her risk of cancer recurrence is much less now. -Return to clinic in 3 months with lab and CT chest abdomen and pelvis.  2. Port -She will continue fresh her port every 6 weeks -We discussed to remove port if her next brain MRI and CT scans shows no evidence of disease  3. Peripheral neuropathy,  grade 1, secondary to chemotherapy  -stable  She will continue follow-up with her primary care physician for other medical problems  ll questions were answered. The patient knows to call the clinic with any problems, questions or concerns. We can certainly see the patient much sooner if necessary.   I spent 15 minutes counseling the patient face to face. The total time spent in the appointment was 25 minutes.   Truitt Merle  11/03/2014

## 2014-11-03 NOTE — Telephone Encounter (Signed)
Gave avs & calendar for May & August. °

## 2014-11-10 NOTE — Progress Notes (Signed)
Radiation Oncology         [phone]   Name: Kayla [name]   Date: 11/12/2014   MRN: [id number]  DOB: 1950/12/30    Multidisciplinary Brain and Spine Oncology Clinic Follow-Up Visit Note  CC: No PCP Per Patient  Kayla Gains, DO    ICD-9-CM ICD-10-CM   1. 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  The recent films were presented in our multidisciplinary conference with neuroradiology just prior to the clinic.  Here to review recent MRI report. She is without complaint with mild leg weakness.  No headaches or neuro symptoms.                            ALLERGIES:  is allergic to sulfonamide derivatives.  Meds: Current Outpatient Prescriptions  Medication Sig Dispense Refill  . acetaminophen (TYLENOL) 500 MG tablet Take 1,000 mg by mouth every 6 (six) hours as needed.    . fluticasone (FLONASE) 50 MCG/ACT nasal spray Place 1 spray into both nostrils daily.    [name] gabapentin (NEURONTIN) 300 MG capsule TAKE ONE CAPSULE BY MOUTH AT BEDTIME 90 capsule 0  . ibuprofen (ADVIL,MOTRIN) 200 MG tablet Take 200 mg by mouth every 8 (eight) hours as needed. For pain    . lidocaine-prilocaine (EMLA) cream Apply topically as needed.    [name] lisinopril (PRINIVIL,ZESTRIL) 40 MG tablet Take 1 tablet (40 mg total) by mouth daily. 90 tablet 0   No current facility-administered medications for this encounter.    Physical Findings: The patient is in no acute distress. Patient is alert and oriented.  No significant changes.  Lab Findings: Lab Results  Component Value Date   WBC 7.8 11/03/2014   HGB 14.1 11/03/2014   HCT 44.1 11/03/2014   MCV 84.6 11/03/2014   PLT 212 11/03/2014    '@LASTCHEM'$ @  Radiographic  Findings: Mr Kayla [name] Contrast  11/11/2014   CLINICAL DATA:  64 year old female with stage IV colon cancer s/p resection and SRS for a 2.7 cm solitary left parietal brain metastasis in 2012.  Restaging.  Subsequent encounter.  EXAM: MRI HEAD WITHOUT AND WITH CONTRAST  TECHNIQUE: Multiplanar, multiecho pulse sequences of the brain and surrounding structures were obtained without and with intravenous contrast.  CONTRAST:  79m MULTIHANCE GADOBENATE DIMEGLUMINE 529 MG/ML IV SOLN  COMPARISON:  06/27/2014 and earlier.  FINDINGS: Essentially unchanged MRI appearance of the treatment site in the superior left parietal and posterior frontal lobes compared to 11/01/2013, including stellate residual enhancement. No mass effect. Mild ex vacuo enlargement of the posterior left lateral ventricle is stable. Overlying craniotomy sequelae. Minimal additional hemosiderin deposition since 2015 (e.g. Series 5, image 21 today versus series 5, image 20 on 11/01/2013).  Stable post-contrast appearance of the brain elsewhere, no new or abnormal enhancement identified.  Major intracranial vascular flow voids are stable. No restricted diffusion to suggest acute infarction. No midline shift, mass effect, ventriculomegaly, extra-axial collection or acute intracranial hemorrhage. Cervicomedullary junction and pituitary are within normal limits. Stable gray and white matter signal throughout the brain. Visible internal auditory structures appear normal. Visualized paranasal sinuses and mastoids are clear. Visualized orbit  soft tissues are within normal limits. No acute scalp soft tissue findings.  Negative visualized cervical spinal cord. Visible bone marrow signal is stable.  IMPRESSION: Stable and satisfactory posttherapy appearance of the brain since 2014 and 2015. No new brain metastasis identified.   Electronically Signed   By: Kayla [name] M.D.   On: 11/11/2014 17:30    Impression:  The patient has no active brain metastases and no  radionecrosis.  Plan:  MRI in 4 months then follow-up.  This document serves as a record of services personally performed by Kayla Pita, MD. It was created on his behalf by Kayla [name], a trained medical scribe. The creation of this record is based on the scribe's personal observations and the provider's statements to them. This document has been checked and approved by the attending provider.    _____________________________________  Kayla [name]. Kayla [name], M.D.

## 2014-11-11 ENCOUNTER — Ambulatory Visit
Admission: RE | Admit: 2014-11-11 | Discharge: 2014-11-11 | Disposition: A | Discharge status: Home / Self Care | Payer: Medicare HMO | Source: Ambulatory Visit | Attending: Radiation Oncology | Admitting: Radiation Oncology

## 2014-11-11 DIAGNOSIS — C7931 Secondary malignant neoplasm of brain: Secondary | ICD-10-CM

## 2014-11-11 MED ORDER — GADOBENATE DIMEGLUMINE 529 MG/ML IV SOLN
18.0000 mL | Freq: Once | INTRAVENOUS | Status: AC | PRN
  Administered 2014-11-11: 18 mL via INTRAVENOUS

## 2014-11-12 ENCOUNTER — Ambulatory Visit
Admission: RE | Admit: 2014-11-12 | Discharge: 2014-11-12 | Disposition: A | Discharge status: Home / Self Care | Payer: Medicare HMO | Source: Ambulatory Visit | Attending: Radiation Oncology | Admitting: Radiation Oncology

## 2014-11-12 DIAGNOSIS — C7931 Secondary malignant neoplasm of brain: Secondary | ICD-10-CM

## 2014-11-12 DIAGNOSIS — C7949 Secondary malignant neoplasm of other parts of nervous system: Principal | ICD-10-CM

## 2014-11-13 ENCOUNTER — Encounter: Payer: Self-pay | Admitting: Radiation Therapy

## 2014-12-05 ENCOUNTER — Other Ambulatory Visit: Payer: Self-pay | Admitting: *Deleted

## 2014-12-05 DIAGNOSIS — C7931 Secondary malignant neoplasm of brain: Secondary | ICD-10-CM

## 2014-12-05 DIAGNOSIS — C7949 Secondary malignant neoplasm of other parts of nervous system: Principal | ICD-10-CM

## 2014-12-16 ENCOUNTER — Ambulatory Visit (HOSPITAL_BASED_OUTPATIENT_CLINIC_OR_DEPARTMENT_OTHER): Payer: Medicare HMO

## 2014-12-16 VITALS — BP 130/67 | HR 96 | Temp 98.3°F | Resp 18

## 2014-12-16 DIAGNOSIS — Z452 Encounter for adjustment and management of vascular access device: Secondary | ICD-10-CM | POA: Diagnosis not present

## 2014-12-16 DIAGNOSIS — Z85038 Personal history of other malignant neoplasm of large intestine: Secondary | ICD-10-CM | POA: Diagnosis not present

## 2014-12-16 DIAGNOSIS — Z95828 Presence of other vascular implants and grafts: Secondary | ICD-10-CM

## 2014-12-16 MED ORDER — HEPARIN SOD (PORK) LOCK FLUSH 100 UNIT/ML IV SOLN
500.0000 [IU] | Freq: Once | INTRAVENOUS | Status: AC
  Administered 2014-12-16: 500 [IU] via INTRAVENOUS
  Filled 2014-12-16: qty 5

## 2014-12-16 MED ORDER — SODIUM CHLORIDE 0.9 % IJ SOLN
10.0000 mL | INTRAMUSCULAR | Status: DC | PRN
  Administered 2014-12-16: 10 mL via INTRAVENOUS
  Filled 2014-12-16: qty 10

## 2014-12-16 NOTE — Patient Instructions (Signed)

## 2014-12-19 ENCOUNTER — Telehealth: Payer: Self-pay | Admitting: *Deleted

## 2014-12-19 NOTE — Telephone Encounter (Signed)
Spoke with pt today and was informed that pt was instructed by radiology to come in at 1030 am for labs prior to CT scan scheduled at 1130 am on 02/02/15.

## 2014-12-23 ENCOUNTER — Telehealth: Payer: Self-pay | Admitting: Hematology

## 2014-12-23 NOTE — Telephone Encounter (Signed)
Patient confirmed appointment moved to July. Mailed calendar for July.

## 2015-02-02 ENCOUNTER — Ambulatory Visit (HOSPITAL_COMMUNITY)
Admission: RE | Admit: 2015-02-02 | Discharge: 2015-02-02 | Disposition: A | Discharge status: Home / Self Care | Payer: Medicare HMO | Source: Ambulatory Visit | Attending: Hematology | Admitting: Hematology

## 2015-02-02 ENCOUNTER — Ambulatory Visit (HOSPITAL_BASED_OUTPATIENT_CLINIC_OR_DEPARTMENT_OTHER): Payer: Medicare HMO

## 2015-02-02 ENCOUNTER — Other Ambulatory Visit (HOSPITAL_BASED_OUTPATIENT_CLINIC_OR_DEPARTMENT_OTHER): Payer: Medicare HMO

## 2015-02-02 ENCOUNTER — Encounter (HOSPITAL_COMMUNITY): Payer: Self-pay

## 2015-02-02 DIAGNOSIS — Z95828 Presence of other vascular implants and grafts: Secondary | ICD-10-CM

## 2015-02-02 DIAGNOSIS — Z9049 Acquired absence of other specified parts of digestive tract: Secondary | ICD-10-CM | POA: Insufficient documentation

## 2015-02-02 DIAGNOSIS — N63 Unspecified lump in breast: Secondary | ICD-10-CM | POA: Insufficient documentation

## 2015-02-02 DIAGNOSIS — Z452 Encounter for adjustment and management of vascular access device: Secondary | ICD-10-CM

## 2015-02-02 DIAGNOSIS — Z85038 Personal history of other malignant neoplasm of large intestine: Secondary | ICD-10-CM | POA: Insufficient documentation

## 2015-02-02 DIAGNOSIS — C189 Malignant neoplasm of colon, unspecified: Secondary | ICD-10-CM

## 2015-02-02 DIAGNOSIS — C7931 Secondary malignant neoplasm of brain: Secondary | ICD-10-CM

## 2015-02-02 LAB — CBC WITH DIFFERENTIAL/PLATELET
BASO%: 0.9 % (ref 0.0–2.0)
Basophils Absolute: 0.1 10*3/uL (ref 0.0–0.1)
EOS%: 1.1 % (ref 0.0–7.0)
Eosinophils Absolute: 0.1 10*3/uL (ref 0.0–0.5)
HEMATOCRIT: 42.6 % (ref 34.8–46.6)
HEMOGLOBIN: 13.9 g/dL (ref 11.6–15.9)
LYMPH#: 2.1 10*3/uL (ref 0.9–3.3)
LYMPH%: 22.5 % (ref 14.0–49.7)
MCH: 27.4 pg (ref 25.1–34.0)
MCHC: 32.5 g/dL (ref 31.5–36.0)
MCV: 84.3 fL (ref 79.5–101.0)
MONO#: 0.5 10*3/uL (ref 0.1–0.9)
MONO%: 5.5 % (ref 0.0–14.0)
NEUT#: 6.7 10*3/uL — ABNORMAL HIGH (ref 1.5–6.5)
NEUT%: 70 % (ref 38.4–76.8)
Platelets: 198 10*3/uL (ref 145–400)
RBC: 5.06 10*6/uL (ref 3.70–5.45)
RDW: 15 % — ABNORMAL HIGH (ref 11.2–14.5)
WBC: 9.5 10*3/uL (ref 3.9–10.3)

## 2015-02-02 LAB — COMPREHENSIVE METABOLIC PANEL (CC13)
ALK PHOS: 118 U/L (ref 40–150)
ALT: 30 U/L (ref 0–55)
ANION GAP: 9 meq/L (ref 3–11)
AST: 22 U/L (ref 5–34)
Albumin: 3.5 g/dL (ref 3.5–5.0)
BUN: 12.1 mg/dL (ref 7.0–26.0)
CHLORIDE: 108 meq/L (ref 98–109)
CO2: 25 meq/L (ref 22–29)
CREATININE: 0.8 mg/dL (ref 0.6–1.1)
Calcium: 9.6 mg/dL (ref 8.4–10.4)
EGFR: >90 mL/min/{1.73_m2} (ref >90)
GLUCOSE: 89 mg/dL (ref 70–140)
Potassium: 4.4 mEq/L (ref 3.5–5.1)
SODIUM: 141 meq/L (ref 136–145)
TOTAL PROTEIN: 7.2 g/dL (ref 6.4–8.3)
Total Bilirubin: 0.93 mg/dL (ref 0.20–1.20)

## 2015-02-02 MED ORDER — IOHEXOL 300 MG/ML  SOLN
100.0000 mL | Freq: Once | INTRAMUSCULAR | Status: AC | PRN
  Administered 2015-02-02: 100 mL via INTRAVENOUS

## 2015-02-02 MED ORDER — HEPARIN SOD (PORK) LOCK FLUSH 100 UNIT/ML IV SOLN
500.0000 [IU] | Freq: Once | INTRAVENOUS | Status: AC
  Administered 2015-02-02: 500 [IU] via INTRAVENOUS
  Filled 2015-02-02: qty 5

## 2015-02-02 MED ORDER — SODIUM CHLORIDE 0.9 % IJ SOLN
10.0000 mL | INTRAMUSCULAR | Status: DC | PRN
  Administered 2015-02-02: 10 mL via INTRAVENOUS
  Filled 2015-02-02: qty 10

## 2015-02-09 ENCOUNTER — Telehealth: Payer: Self-pay | Admitting: Hematology

## 2015-02-09 ENCOUNTER — Encounter: Payer: Self-pay | Admitting: Hematology

## 2015-02-09 ENCOUNTER — Ambulatory Visit (HOSPITAL_BASED_OUTPATIENT_CLINIC_OR_DEPARTMENT_OTHER): Payer: Medicare HMO | Admitting: Hematology

## 2015-02-09 VITALS — BP 126/66 | HR 72 | Temp 98.2°F | Resp 17 | Ht 63.0 in | Wt 186.4 lb

## 2015-02-09 DIAGNOSIS — C7931 Secondary malignant neoplasm of brain: Secondary | ICD-10-CM

## 2015-02-09 DIAGNOSIS — G620 Drug-induced polyneuropathy: Secondary | ICD-10-CM | POA: Diagnosis not present

## 2015-02-09 DIAGNOSIS — Z85038 Personal history of other malignant neoplasm of large intestine: Secondary | ICD-10-CM

## 2015-02-09 NOTE — Telephone Encounter (Signed)
Gave and printed appt sched and avs fo rpt for Aug and OCT

## 2015-02-09 NOTE — Progress Notes (Signed)
Hematology and Oncology Follow Up Visit Date of Visit: 02/09/2015   Kayla [name] [id number] Nov 30, 1950 64 y.o. 02/09/2015 7:45 AM  PCP: [facility] [phone])  Principle Diagnosis: Colon cancer, T4N1M1, Stage IV, diagnosed on 10/27/2008, brain recurrence in 03/2011   Prior Therapy:  Kayla [name] underwent surgical resection of her tumor with a colostomy at the time of her surgery on 10/27/2008. The colostomy has been subsequently reversed on 09/20/2010. Kayla [name] received chemotherapy consisting of FOLFOX for 10 treatments from 12/10/2008 through 06/02/2009. She achieved a partial remission from these treatments. She then received additional chemotherapy with 5-FU, leucovorin, and 5-FU by continuous infusion along with Avastin from 06/23/2009 through 11/17/2009. 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   Current therapy:  Observation  Interim History:  She was last seen by me 3 month ago. She is here for followup of her metastatic colon cancer with recurrence in the left parietal region of the brain in 03/2011.   She is clinically doing very well. She denies any significant pain , nausea, change of bowel habits , or other new symptoms. She has stable mild neuropathy on her feet,  Stable.  She has good appetite and eating well.  Past Medical History  Diagnosis Date  . Pericardial effusion     echo 08/13/09  . LVH (left ventricular hypertrophy)     mod/severe. echo 1/11. EF 65-70%   . Hypertension   . Thrombocytopenia   . 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 Allergy     sulfa  . Colon cancer     colon/ 2010/surg/chemo  . History of radiation therapy 04/15/11    17 Gy single fraction  l parietal brain metastais  . Heart murmur   . Peripheral vascular disease   . Shortness of breath   . Blood transfusion   . GERD (gastroesophageal reflux disease)   . Headache(784.0)   . Neuromuscular disorder     peripheral neuropathy  feet  . Arthritis   . Diverticulosis 07/22/2010    sigmoid colon     Medications: I have reviewed the patient's current medications.  Current Outpatient Prescriptions  Medication Sig Dispense Refill  . acetaminophen (TYLENOL) 500 MG tablet Take 1,000 mg by mouth every 6 (six) hours as needed.    . fluticasone (FLONASE) 50 MCG/ACT nasal spray Place 1 spray into both nostrils daily.    [name] gabapentin (NEURONTIN) 300 MG capsule TAKE ONE CAPSULE BY MOUTH AT BEDTIME 90 capsule 0  . ibuprofen (ADVIL,MOTRIN) 200 MG tablet Take 200 mg by mouth every 8 (eight) hours as needed. For pain    . lidocaine-prilocaine (EMLA) cream Apply topically as needed.    [name] lisinopril (PRINIVIL,ZESTRIL) 40 MG tablet Take 1 tablet (40 mg total) by mouth daily. 90 tablet 0   No current facility-administered medications for this visit.   Allergies:  Allergies  Allergen Reactions  . Sulfonamide Derivatives Rash   Oncology History: Diagnosis of colon cancer dates back to 10/27/2008 when Kayla [name] presented with what turned out to be a bowel perforation through tumor. Stage at that time was T4 N1 M1 with pulmonary metastatic disease. The K-ras mutation was detected. Kayla [name] underwent surgical resection of her tumor with a colostomy at the time of her surgery on 10/27/2008. The colostomy has been subsequently reversed on 09/20/2010. Kayla [name] received chemotherapy consisting of FOLFOX for 10 treatments from 12/10/2008 through 06/02/2009. She achieved a partial remission from these treatments. She then  received additional chemotherapy with 5-FU, leucovorin, and 5-FU by continuous infusion along with Avastin from 06/23/2009 through 11/17/2009. Forever had some peripheral neuropathy in her feet from the oxaliplatin. She was doing well without evidence of disease until she developed a right hemiparesthesia in September 2012 and was found to have a 3 x 3 x 2.4 cm, lobulated, enhancing mass in the left parietal lobe with marked  surrounding edema. A PET scan on 03/25/2011 showed resolution of the previously identified pulmonary nodules with no residual hypermetabolic activity. The pericardial effusion, which has been present since diagnosis, was unchanged. There were also uterine fibroids and a low-density lesion in the anterior spleen that was stable and not associated with any hypermetabolic activity. Of note, Kayla [name] had a markedly elevated CEA up to 24.7 on 03/23/2011. On 06/23/2011, the CEA was less than 0.5. Dr. Erline Levine resected the recurrent metastatic colon cancer on 03/31/2011. This was followed by stereotactic radiation on 04/15/2011. The patient underwent a left-sided craniotomy and excision of the mass involving her left parietal lobe on 11/10/2011 by Dr. Erline Levine. The pathology report was negative for any malignancy. Pathology report indicated benign brain with fibrosis, hemorrhage, hemosiderin deposition, abundant dystrophic calcifications, and mixed acute and chronic inflammation including foreign body multinucleated giant cells.    Past Medical History, Surgical history, Social history, and Family History were reviewed and updated.  SMOKING HISTORY:  The patient has smoked intermittently, about 2 packs of cigarettes per week, since she was a teenager, but stopped smoking in 2010.  Review of Systems: Constitutional:  Negative for fever, chills, night sweats, anorexia, weight loss, pain. Cardiovascular: no chest pain or dyspnea on exertion Respiratory: no cough, shortness of breath, or wheezing Neurological: no TIA or stroke symptoms Dermatological: negative for rash ENT: negative for - epistaxis, headaches, tinnitus, vertigo or visual changes Skin: Negative. Gastrointestinal: no abdominal pain, change in bowel habits, or black or bloody stools positive for - heartburn Genito-Urinary: no dysuria, trouble voiding, or hematuria Hematological and Lymphatic: negative for - bleeding problems, fatigue,  jaundice or pallor Breast: negative for breast lumps Musculoskeletal: positive for - muscular weakness and pins and needles with some numbness (R>L) in lower extremities negative for - gait disturbance Remaining ROS negative. Physical Exam: There were no vitals taken for this visit. ECOG: 0 General appearance: alert, cooperative, appears stated age, no distress and mildly obese Head: Normocephalic, without obvious abnormality, atraumatic Neck: no adenopathy, supple, symmetrical, trachea midline and thyroid not enlarged, symmetric, no tenderness/mass/nodules HEENT: PERRLA; EOMi; No sclerae icterus. OP clear of masses.  Lymph nodes: Cervical, supraclavicular, and axillary nodes normal. Heart:regular rate and rhythm, S1, S2 normal, no murmur, click, rub or gallop Lung:chest clear, no wheezing, rales, normal symmetric air entry,  + R sided port-a-cath. Abdomin: soft, non-tender, without masses or organomegaly, normal bowel sounds and surgical scars well healed. EXT:No peripheral edema.  R>L decreased sensation and mildly decreased strength (noted in prior exams) Neuro: R lower extremity weakness and decreased sensation.  Normal gait.  Good finger to nose bilaterally. Otherwise no focal deficits.   Lab Results: CBC Latest Ref Rng 02/02/2015 11/03/2014 08/04/2014  WBC 3.9 - 10.3 10e3/uL 9.5 7.8 9.7  Hemoglobin 11.6 - 15.9 g/dL 13.9 14.1 13.7  Hematocrit 34.8 - 46.6 % 42.6 44.1 43.3  Platelets 145 - 400 10e3/uL 198 212 230    CMP Latest Ref Rng 02/02/2015 11/03/2014 08/04/2014  Glucose 70 - 140 mg/dl 89 92 86  BUN 7.0 - 26.0 mg/dL 12.1 12.0 18.1  Creatinine 0.6 - 1.1 mg/dL 0.8 0.8 0.8  Sodium 136 - 145 mEq/L 141 142 144  Potassium 3.5 - 5.1 mEq/L 4.4 4.1 4.1  Chloride 98 - 107 mEq/L - - -  CO2 22 - 29 mEq/L _0 Calcium 8.4 - 10.4 mg/dL 9.6 9.2 9.6  Total Protein 6.4 - 8.3 g/dL 7.2 7.3 7.4  Albumin 3.3 - 5.5 g/dL - - -  Total Bilirubin 0.20 - 1.20 mg/dL 0.93 0.71 0.54  Alkaline Phos  40 - 150 U/L 118 109 127  AST 5 - 34 U/L _1 ALT 0 - 55 U/L 30 27 33   CEA  Status: Finalresult Visible to patient:  Not Released Nextappt: 08/11/2014 at 09:40 AM in Radiology ([facility]-MM 1) Dx:  Metastatic cancer to brain           Ref Range 6moago  631mogo  4m31moo  5yr72yr     CEA 0.0 - 5.0 ng/mL 0.9 1.3 1.4 1.1       Radiological Studies: 1. MRI of the head with and without IV contrast on 03/23/2011 showed a solitary enhancing mass in the left parietal lobe with surrounding white matter edema, most compatible with solitary metastatic deposit. The mass lesion measured 2.7 x 2.5 cm.  2. PET scan from 03/25/2011 showed resolution of the previously- identified pulmonary nodules with no residual hypermetabolic activity noted in the neck, chest, abdomen or pelvis to suggest active malignancy. There was a pericardial effusion and some subsegmental atelectasis in the lower lobes. There were also uterine fibroids and a low-density lesion in the anterior spleen that was stable and not associated with hypermetabolic activity.  3. MRI of the head with and without IV contrast showed Stealth protocol utilized to evaluate left parietal mass. The mass measured 3 x 3 x 2.4 cm with marked surrounding vasogenic edema.  4. MRI of the head with and without IV contrast on 04/08/2011 showed postsurgical changes of tumor removal in the left parietal lobe and a postsurgical hematoma with enhancement. There was 3 mm of midline shift. No other metastatic deposits.  5. MRI of the head with and without IV contrast on 06/28/2011 showed a 3 cm area of restricted diffusion lying within the previous area of  left parietal surgical resection for colon cancer. There was enhancement of the peripheral aspect of the cavity, marked restricted diffusion and increasing vasogenic edema. There was concern about the development of a brain abscess.  6. MRI of the head with and without IV contrast on  07/29/2011 showed left parietal postoperative hematoma was smaller compared with the  prior study. There was no nodular enhancement seen to indicate tumor. No new enhancing lesions were seen. There was significant  improvement in the left parietal edema.  7. Digital screening mammogram was negative on 08/02/2011.  8. An MRI of the head with and without IV contrast on 10/21/2011 showed development of gyriform enhancement circumferentially along the margins of the left parietal postoperative space/hematoma. Marked increase in vasogenic edema was seen. 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 There was further contraction of the hematoma/postoperative space in the left parietal cortical region, now measuring 17 x 19 mm in transverse diameter as opposed to 22 x 21 mm previously.  Left-to-right shift was 2 mm.  9. Nuclear medicine brain PET-CT scan carried out on 10/27/2011 showed hypermetabolic activity in the high left parietal lobe which corresponds to gyriform  enhancement on comparison MRI from 10/21/2011. This was felt to be most consistent with recurrent  tumor.  10. Chest x-ray, 2 view from 11/04/2011, showed chronic cardiomegaly. Port-A-Cath was in place. No acute disease was present.  11. MRI of the head with and without IV contrast on 01/27/2012 showed findings that were felt to be consistent with progression of disease. This exam was compared with the MRI of 10/21/2011. It was recognized that the patient underwent surgical resection of the mass on 11/10/2011.  There was felt to be on the present exam residual peripheral enhancement of approximately 35 x 40 x 21 mm with infiltration of the cortex and deep white matter. It was felt that the enhancement had progressed, despite removal of the central focus. There was moderate vasogenic  edema throughout the left hemisphere but slightly decreased from the MRI from 10/21/2011. No new lesions were  seen. There was mild atrophy with chronic microvascular ischemic change, but no midline shift. Major intracranial vascular structures were patent.  12. MRI of the brain with and without IV contrast on 05/04/2012 showed previous left parietal lobe surgery for resection of tumor and radiation necrosis.  No new abnormalities were noted. 13. Chest x-ray, 2 view, on 07/03/2012 showed enlargement of the cardiac silhouette with some lingular scarring, as compared with the chest x-ray from 11/04/2011. 14. 2-D echocardiogram from 07/12/2012 showed left ventricular ejection fraction of 55-60%.  There was a small to moderate circumferential pericardial effusion with no signs of tamponade.  There was felt to be no significant change from the 2-D echocardiogram dated 08/13/2009. 15. Digital bilateral screening mammogram on 08/06/2012 was negative. 16. 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  There was no progression or new brain metastasis identified. 17. PET scan from 10/16/2012 showed no specific features to suggest metastatic disease.  There was diffuse uptake throughout the thyroid gland, possibly due to hyperthyroidism. 18. MRI of the brain with and without IV contrast on 03/12/2013 showed residual gyriform enhancement surrounding the area of previous surgical resection of a left parietal metastasis with subsequent resection of radionecrosis. The findings likely represent some residual brain injury, but there is no significant progression of radionecrosis and no new lesions seen. 19. MRI of the brain with and without IV contrast on 11/01/2013 showed Stable posttherapy appearance of the brain since May of 2014. No progression or new brain metastasis identified. 20. CT chest,abomen and pelvis with contrast. 1. No evidence of thoracic metastasis.  2. Small  prevascular lymph node is similar prior. 3. Stable pericardial effusion. 4. Interval increase in the volume presacral lymph node in the upper pelvis. This has progressed slowly from 2011. Recommend attention on follow-up versus restaging FDG PET scan. 5. Stable anastomosis in the descending colon. 21. CT chest, abdomen and pelvis with contrast 07/126/2015. 1. No evidence of local colon cancer recurrence or metastasis within the abdomen or pelvis. 2. Retention of stool at the colocolonic anastomosis in the left colon is unchanged comparison exam. No obstructing lesion identified.  3. Mesenteric lymph node is again demonstrated with no increased size. 4. Stable splenic lesion likely representing a complex cysts or hemangioma. 5. Stable small pericardial effusion  Brain MRI 11/11/2014 IMPRESSION: Stable and satisfactory posttherapy appearance of the brain since 2014 and 2015. No new brain metastasis identified.  CT chest, abdomen and pelvis w contrast 02/02/2015  IMPRESSION: 1. No new  or progressive findings to suggest recurrent colorectal cancer in the chest, abdomen, or pelvis. 2. 16 mm soft tissue nodule in the medial right breast. Correlation with mammographic history recommended. 3. Fibroid change in the uterus. 4. Stable hypodense lesion in the anterior spleen, compatible with complex cyst or hemangioma. 5. Stable appearance of the trace pericardial effusion.  Impression and Plan:  1. Stage IV Colon Cancer mets to lung and brain.  --Zendaya continues to do well with no evidence for disease recurrence.  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   -She is clinically doing very well. Her CEA level has been normal, today's result is still pending. - her last brain MRI in April 2016  Was negative for recurrence - I reviewed her restaging CT scans  From last week, which showed no evidence of recurrence - she  is clinically doing well, physical exam is unremarkable, we'll continue observation. - return to clinic in 3 months with lab including CBC, CMP and CEA.  2. Port -She will continue fresh her port every 6 weeks - I recommend her to remove the port, however she wants to keep it for now   3. Peripheral neuropathy, grade 1, secondary to chemotherapy  -stable  She will continue follow-up with her primary care physician for other medical problems  ll questions were answered. The patient knows to call the clinic with any problems, questions or concerns. We can certainly see the patient much sooner if necessary.   I spent 15 minutes counseling the patient face to face. The total time spent in the appointment was 25 minutes.   Truitt Merle  02/09/2015

## 2015-02-22 ENCOUNTER — Other Ambulatory Visit: Payer: Self-pay | Admitting: Hematology

## 2015-02-22 DIAGNOSIS — C7931 Secondary malignant neoplasm of brain: Secondary | ICD-10-CM

## 2015-02-24 ENCOUNTER — Other Ambulatory Visit: Payer: Self-pay | Admitting: Hematology

## 2015-02-24 ENCOUNTER — Telehealth: Payer: Self-pay | Admitting: Hematology

## 2015-02-24 DIAGNOSIS — C7931 Secondary malignant neoplasm of brain: Secondary | ICD-10-CM

## 2015-02-24 NOTE — Telephone Encounter (Signed)
per pof to sch pt mamma-cld &spoke to pt and gave pt time/date/location of  appt-pt understood

## 2015-02-26 ENCOUNTER — Other Ambulatory Visit: Payer: Self-pay | Admitting: Hematology

## 2015-02-26 ENCOUNTER — Other Ambulatory Visit: Payer: Self-pay

## 2015-02-26 DIAGNOSIS — C7931 Secondary malignant neoplasm of brain: Secondary | ICD-10-CM

## 2015-02-26 DIAGNOSIS — N649 Disorder of breast, unspecified: Secondary | ICD-10-CM

## 2015-02-27 ENCOUNTER — Other Ambulatory Visit: Payer: Self-pay | Admitting: Hematology

## 2015-02-27 ENCOUNTER — Ambulatory Visit
Admission: RE | Admit: 2015-02-27 | Discharge: 2015-02-27 | Disposition: A | Discharge status: Home / Self Care | Payer: Medicare HMO | Source: Ambulatory Visit | Attending: Hematology | Admitting: Hematology

## 2015-02-27 DIAGNOSIS — N649 Disorder of breast, unspecified: Secondary | ICD-10-CM

## 2015-02-27 DIAGNOSIS — N6310 Unspecified lump in the right breast, unspecified quadrant: Secondary | ICD-10-CM

## 2015-03-02 ENCOUNTER — Other Ambulatory Visit: Payer: Medicare HMO

## 2015-03-02 ENCOUNTER — Ambulatory Visit: Payer: Medicare HMO | Admitting: Hematology

## 2015-03-03 ENCOUNTER — Ambulatory Visit
Admission: RE | Admit: 2015-03-03 | Discharge: 2015-03-03 | Disposition: A | Discharge status: Home / Self Care | Payer: Medicare HMO | Source: Ambulatory Visit | Attending: Hematology | Admitting: Hematology

## 2015-03-03 ENCOUNTER — Other Ambulatory Visit: Payer: Self-pay | Admitting: Radiation Oncology

## 2015-03-03 ENCOUNTER — Ambulatory Visit
Admission: RE | Admit: 2015-03-03 | Discharge: 2015-03-03 | Disposition: A | Discharge status: Home / Self Care | Payer: Medicare HMO | Source: Ambulatory Visit | Attending: Radiation Oncology | Admitting: Radiation Oncology

## 2015-03-03 DIAGNOSIS — N6310 Unspecified lump in the right breast, unspecified quadrant: Secondary | ICD-10-CM

## 2015-03-04 ENCOUNTER — Other Ambulatory Visit: Payer: Self-pay | Admitting: Hematology

## 2015-03-05 ENCOUNTER — Telehealth: Payer: Self-pay | Admitting: Hematology

## 2015-03-05 NOTE — Telephone Encounter (Signed)
Left message for patient re appointment for 8/19. Date/time per 8/17 pof - aware of GI Clinic.

## 2015-03-06 ENCOUNTER — Encounter: Payer: Self-pay | Admitting: Hematology

## 2015-03-06 ENCOUNTER — Encounter: Payer: Self-pay | Admitting: General Surgery

## 2015-03-06 ENCOUNTER — Telehealth: Payer: Self-pay | Admitting: Hematology

## 2015-03-06 ENCOUNTER — Encounter: Payer: Self-pay | Admitting: *Deleted

## 2015-03-06 ENCOUNTER — Ambulatory Visit (HOSPITAL_BASED_OUTPATIENT_CLINIC_OR_DEPARTMENT_OTHER): Payer: Medicare HMO | Admitting: Hematology

## 2015-03-06 VITALS — BP 122/59 | HR 85 | Temp 98.2°F | Resp 17 | Ht 63.0 in | Wt 184.6 lb

## 2015-03-06 DIAGNOSIS — Z85038 Personal history of other malignant neoplasm of large intestine: Secondary | ICD-10-CM

## 2015-03-06 DIAGNOSIS — Z170 Estrogen receptor positive status [ER+]: Secondary | ICD-10-CM | POA: Diagnosis not present

## 2015-03-06 DIAGNOSIS — C50211 Malignant neoplasm of upper-inner quadrant of right female breast: Secondary | ICD-10-CM

## 2015-03-06 NOTE — Progress Notes (Signed)
Kayla [name] 03/06/2015 1:37 PM Location: [facility] Patient #: [id number] DOB: 1951/01/02 Divorced / Language: Kayla [name] / Race: Black or African American Female  History of Present Illness Kayla Hollingshead MD; 03/06/2015 2:23 PM) Patient words: new breast cancer.  The patient is a 64 year old female   Note:She is referred by Dr. Glennon Mac because of invasive right breast cancer involving the 1:30 position of the right breast. She has a history of medical static colon cancer and has survived liver and brain metastasis. On CT scan to follow up with her colon cancer, a mass in the upper-inner quadrant of the right breast was noted. Diagnostic mammogram demonstrated this mass as well. The mass is about 2 cm in size on ultrasound. Biopsy demonstrated invasive breast cancer that is ER and PR positive. She saw Dr. Burr Medico who thought she was a good candidate for lumpectomy, antiestrogen treatment, and possible radiation. She has a family history of breast cancer and her mother was in her 25s at that time. The age of her menarche was 31, first child born at age 38, late menopause at age 61.   Other Problems Jeralyn Ruths, [place]; 03/06/2015 1:38 PM) Arthritis Cancer Colon Cancer High blood pressure Lump In Breast  Diagnostic Studies History Jeralyn Ruths, CMA; 03/06/2015 1:38 PM) Colonoscopy within last year Mammogram within last year Pap Smear 1-5 years ago  Allergies Jeralyn Ruths, CMA; 03/06/2015 1:40 PM) Sulfabenzamide *CHEMICALS*  Medication History Jeralyn Ruths, CMA; 03/06/2015 1:40 PM) Gabapentin ('300MG'$  Capsule, Oral daily) Active. Lisinopril ('40MG'$  Tablet, Oral daily) Active. Medications Reconciled  Social History Jeralyn Ruths, [place]; 03/06/2015 1:38 PM) Alcohol use Occasional alcohol use. Caffeine use Carbonated beverages. No drug use Tobacco use Former smoker.  Family History Jeralyn Ruths, [place]; 03/06/2015 1:38 PM) Breast Cancer Family  Members In General, Mother. Colon Cancer Family Members In General. Diabetes Mellitus Father. Heart Disease Father. Heart disease in female family member before age 69 Hypertension Family Members In General.  Pregnancy / Birth History Jeralyn Ruths, [place]; 03/06/2015 1:38 PM) Age at menarche 59 years. Age of menopause 47-55 Gravida 2 Irregular periods Maternal age 61-20 Para 2  Review of Systems ([place]; 03/06/2015 1:38 PM) General Not Present- Appetite Loss, Chills, Fatigue, Fever, Night Sweats, Weight Gain and Weight Loss. Skin Not Present- Change in Wart/Mole, Dryness, Hives, Jaundice, New Lesions, Non-Healing Wounds, Rash and Ulcer. HEENT Present- Wears glasses/contact lenses. Not Present- Earache, Hearing Loss, Hoarseness, Nose Bleed, Oral Ulcers, Ringing in the Ears, Seasonal Allergies, Sinus Pain, Sore Throat, Visual Disturbances and Yellow Eyes. Breast Present- Breast Mass. Not Present- Breast Pain, Nipple Discharge and Skin Changes. Cardiovascular Not Present- Chest Pain, Difficulty Breathing Lying Down, Leg Cramps, Palpitations, Rapid Heart Rate, Shortness of Breath and Swelling of Extremities. Gastrointestinal Not Present- Abdominal Pain, Bloating, Bloody Stool, Change in Bowel Habits, Chronic diarrhea, Constipation, Difficulty Swallowing, Excessive gas, Gets full quickly at meals, Hemorrhoids, Indigestion, Nausea, Rectal Pain and Vomiting. Female Genitourinary Not Present- Frequency, Nocturia, Painful Urination, Pelvic Pain and Urgency. Musculoskeletal Present- Joint Pain. Not Present- Back Pain, Joint Stiffness, Muscle Pain, Muscle Weakness and Swelling of Extremities. Neurological Present- Numbness and Tingling. Not Present- Decreased Memory, Fainting, Headaches, Seizures, Tremor, Trouble walking and Weakness. Psychiatric Not Present- Anxiety, Bipolar, Change in Sleep Pattern, Depression, Fearful and Frequent crying. Endocrine Not Present- Cold Intolerance,  Excessive Hunger, Hair Changes, Heat Intolerance, Hot flashes and New Diabetes. Hematology Not Present- Easy Bruising, Excessive bleeding, Gland problems, HIV and Persistent Infections.   Vitals Jearld Fenton Morris  CMA; 03/06/2015 1:47 PM) 03/06/2015 1:46 PM Weight: 185.2 lb Height: 64in Body Surface Area: 1.95 m Body Mass Index: 31.79 kg/m Temp.: 98.39F(Oral)  Pulse: 84 (Regular)  BP: 122/80 (Sitting, Left Arm, Standard)    Physical Exam Kayla Hollingshead MD; 03/06/2015 2:20 PM) The physical exam findings are as follows: Note:General: Overweight female in NAD. Pleasant and cooperative.  HEENT: [place]/AT, no facial masses  EYES: no icterus  NECK: Supple, no obvious mass  CV: RRR  CHEST: Breath sounds equal and clear. Respirations nonlabored.  BREASTS: Symmetrical in size. There is ecchymosis and swelling in the upper inner quadrant of the right breast. Port-A-Cath is present lateral to this area.  ABDOMEN: Soft, nontender, nondistended, multiple scars are present.  MUSCULOSKELETAL: FROM, good muscle tone, no edema, no venous stasis changes  LYMPHATIC: No palpable cervical, supraclavicular, axillary adenopathy.  NEUROLOGIC: Alert and oriented, answers questions appropriately.  PSYCHIATRIC: Normal mood, affect , and behavior.    Assessment & Plan Kayla Hollingshead MD; 03/06/2015 2:22 PM) MALIGNANT NEOPLASM OF UPPER-INNER QUADRANT OF RIGHT FEMALE BREAST (174.2  C50.211) Impression: Also has a history of metastatic colon cancer which is relative controlled at this time. We discussed breast conservation therapy which would be a right breast lumpectomy after radioactive seed localization. We also discussed possible right axilla sentinel lymph node biopsy. I will discuss this with Dr. Burr Medico If she was not going to have any chemotherapy despite results, we did also remove the Port-A-Cath at the same time. She is due to have genetic testing at the end of this month. She has  some interest, if the genetic testing is positive, of having a mastectomy/mastectomies.  Plan: Initially, we will schedule her for a right breast lumpectomy after radioactive seed localization as well as right axillary sentinel lymph node biopsy about a month from now which should allow [place] to get the genetic testing back. I have explained the procedure, risks, and aftercare. The risks include but are not limited to bleeding, infection, wound problems, seroma formation, cosmetic deformity, anesthesia, nerve injury, lymphedema, need for reexcision or removal of more lymph nodes at a later time. She seems to understand and agrees with the plan.  Jackolyn Confer, MD

## 2015-03-06 NOTE — Telephone Encounter (Signed)
Gave and printed appt sched and avs for pt for Aug and OCT

## 2015-03-06 NOTE — Progress Notes (Addendum)
Hematology and Oncology Follow Up Visit Date of Visit: 03/06/2015   Kayla TOOTHAKER [id number] 1950-11-12 64 y.o. 03/06/2015 7:49 AM  PCP: [place] [phone])  Principle Diagnosis:  1. Colon cancer, T4N1M1, Stage IV, diagnosed on 10/27/2008, brain recurrence in 03/2011  2. Stage IIA right breast cancer, diagnosed in 02/2015  Oncology History   Breast cancer of upper-inner quadrant of right female breast   Staging form: Breast, AJCC 7th Edition     Clinical: Stage IIA (T2, N0, M0) - Unsigned       Breast cancer of upper-inner quadrant of right female breast   02/27/2015 Mammogram Diagnostic mammogram Showed a 2.5 cm irregular mass within the far posterior upper inner right breast, ultrasound confirmed a 1.9 x 1.0 x 0.8 cm mass at 1:30 o'clock 15 submitted from nipple. No axillary adenopathy   03/03/2015 Initial Diagnosis Breast cancer of upper-inner quadrant of right female breast   03/03/2015 Initial Biopsy Right breast needle biopsy showed invasive ductal carcinoma, grade 1-2.   03/03/2015 Receptors her2 ER 100%+, PR 80%+, ki67 15%    Prior Therapy:  [name] underwent surgical resection of her tumor with a colostomy at the time of her surgery on 10/27/2008. The colostomy has been subsequently reversed on 09/20/2010. Kitana received chemotherapy consisting of FOLFOX for 10 treatments from 12/10/2008 through 06/02/2009. She achieved a partial remission from these treatments. She then received additional chemotherapy with 5-FU, leucovorin, and 5-FU by continuous infusion along with Avastin from 06/23/2009 through 11/17/2009. 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   Current therapy:  Observation  Interim History:  Jahleah returns for follow-up and to discuss her recent breast biopsy results. She had a negative mammogram in March of this year, however her recent CT scan, which was ordered for colon cancer surveillance, showed a right  breast mass. So obtain a diagnostic mammogram and ultrasound, which showed a 2.5 cm right breast mass at 1:30 o'clock position. The core needle biopsy showed invasive ductal carcinoma, ER/PR positive. She is here to discuss the results, she is accompanied by her sister. She tolerated the biopsy very well. She has no complaints, feels well.   Past Medical History  Diagnosis Date  . Pericardial effusion     echo 08/13/09  . LVH (left ventricular hypertrophy)     mod/severe. echo 1/11. EF 65-70%   . Hypertension   . Thrombocytopenia   . 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 Allergy     sulfa  . Colon cancer     colon/ 2010/surg/chemo  . History of radiation therapy 04/15/11    17 Gy single fraction  l parietal brain metastais  . Heart murmur   . Peripheral vascular disease   . Shortness of breath   . Blood transfusion   . GERD (gastroesophageal reflux disease)   . Headache(784.0)   . Neuromuscular disorder     peripheral neuropathy feet  . Arthritis   . Diverticulosis 07/22/2010    sigmoid colon   GYN HISTORY  Menarchal: 14 LMP: 54 Contraceptive: a few years  HRT: no  G2P2:    Medications: I have reviewed the patient's current medications.  Current Outpatient Prescriptions  Medication Sig Dispense Refill  . acetaminophen (TYLENOL) 500 MG tablet Take 1,000 mg by mouth every 6 (six) hours as needed.    . fluticasone (FLONASE) 50 MCG/ACT nasal spray Place 1 spray into both nostrils daily.    [name] gabapentin (  NEURONTIN) 300 MG capsule TAKE ONE CAPSULE BY MOUTH AT BEDTIME 90 capsule 0  . ibuprofen (ADVIL,MOTRIN) 200 MG tablet Take 200 mg by mouth every 8 (eight) hours as needed. For pain    . lidocaine-prilocaine (EMLA) cream Apply topically as needed.    [name] lisinopril (PRINIVIL,ZESTRIL) 40 MG tablet Take 1 tablet (40 mg total) by mouth daily. 90 tablet 0   No current facility-administered medications for this visit.   Allergies:  Allergies  Allergen Reactions    . Sulfonamide Derivatives Rash    Oncology History: Diagnosis of colon cancer dates back to 10/27/2008 when [name] presented with what turned out to be a bowel perforation through tumor. Stage at that time was T4 N1 M1 with pulmonary metastatic disease. The K-ras mutation was detected. [name] underwent surgical resection of her tumor with a colostomy at the time of her surgery on 10/27/2008. The colostomy has been subsequently reversed on 09/20/2010. Isaac received chemotherapy consisting of FOLFOX for 10 treatments from 12/10/2008 through 06/02/2009. She achieved a partial remission from these treatments. She then received additional chemotherapy with 5-FU, leucovorin, and 5-FU by continuous infusion along with Avastin from 06/23/2009 through 11/17/2009. Keiry had some peripheral neuropathy in her feet from the oxaliplatin. She was doing well without evidence of disease until she developed a right hemiparesthesia in September 2012 and was found to have a 3 x 3 x 2.4 cm, lobulated, enhancing mass in the left parietal lobe with marked surrounding edema. A PET scan on 03/25/2011 showed resolution of the previously identified pulmonary nodules with no residual hypermetabolic activity. The pericardial effusion, which has been present since diagnosis, was unchanged. There were also uterine fibroids and a low-density lesion in the anterior spleen that was stable and not associated with any hypermetabolic activity. Of note, Shiann had a markedly elevated CEA up to 24.7 on 03/23/2011. On 06/23/2011, the CEA was less than 0.5. Dr. Erline Levine resected the recurrent metastatic colon cancer on 03/31/2011. This was followed by stereotactic radiation on 04/15/2011. The patient underwent a left-sided craniotomy and excision of the mass involving her left parietal lobe on 11/10/2011 by Dr. Erline Levine. The pathology report was negative for any malignancy. Pathology report indicated benign brain with fibrosis,  hemorrhage, hemosiderin deposition, abundant dystrophic calcifications, and mixed acute and chronic inflammation including foreign body multinucleated giant cells.    Past Medical History, Surgical history, Social history, and Family History were reviewed and updated.  SMOKING HISTORY:  The patient has smoked intermittently, about 2 packs of cigarettes per week, since she was a teenager, but stopped smoking in 2010.  Review of Systems: Constitutional:  Negative for fever, chills, night sweats, anorexia, weight loss, pain. Cardiovascular: no chest pain or dyspnea on exertion Respiratory: no cough, shortness of breath, or wheezing Neurological: no TIA or stroke symptoms Dermatological: negative for rash ENT: negative for - epistaxis, headaches, tinnitus, vertigo or visual changes Skin: Negative. Gastrointestinal: no abdominal pain, change in bowel habits, or black or bloody stools positive for - heartburn Genito-Urinary: no dysuria, trouble voiding, or hematuria Hematological and Lymphatic: negative for - bleeding problems, fatigue, jaundice or pallor Breast: negative for breast lumps Musculoskeletal: positive for - muscular weakness and pins and needles with some numbness (R>L) in lower extremities negative for - gait disturbance Remaining ROS negative. Physical Exam: There were no vitals taken for this visit. ECOG: 0 General appearance: alert, cooperative, appears stated age, no distress and mildly obese Head: Normocephalic, without obvious abnormality, atraumatic Neck: no adenopathy, supple, symmetrical,  trachea midline and thyroid not enlarged, symmetric, no tenderness/mass/nodules HEENT: PERRLA; EOMi; No sclerae icterus. OP clear of masses.  Lymph nodes: Cervical, supraclavicular, and axillary nodes normal. Heart:regular rate and rhythm, S1, S2 normal, no murmur, click, rub or gallop Lung:chest clear, no wheezing, rales, normal symmetric air entry,  + R sided port-a-cath. Abdomin:  soft, non-tender, without masses or organomegaly, normal bowel sounds and surgical scars well healed. EXT:No peripheral edema.  R>L decreased sensation and mildly decreased strength (noted in prior exams) Neuro: R lower extremity weakness and decreased sensation.  Normal gait.  Good finger to nose bilaterally. Otherwise no focal deficits.   Lab Results: CBC Latest Ref Rng 02/02/2015 11/03/2014 08/04/2014  WBC 3.9 - 10.3 10e3/uL 9.5 7.8 9.7  Hemoglobin 11.6 - 15.9 g/dL 13.9 14.1 13.7  Hematocrit 34.8 - 46.6 % 42.6 44.1 43.3  Platelets 145 - 400 10e3/uL 198 212 230    CMP Latest Ref Rng 02/02/2015 11/03/2014 08/04/2014  Glucose 70 - 140 mg/dl 89 92 86  BUN 7.0 - 26.0 mg/dL 12.1 12.0 18.1  Creatinine 0.6 - 1.1 mg/dL 0.8 0.8 0.8  Sodium 136 - 145 mEq/L 141 142 144  Potassium 3.5 - 5.1 mEq/L 4.4 4.1 4.1  Chloride 98 - 107 mEq/L - - -  CO2 22 - 29 mEq/L _0 Calcium 8.4 - 10.4 mg/dL 9.6 9.2 9.6  Total Protein 6.4 - 8.3 g/dL 7.2 7.3 7.4  Albumin 3.3 - 5.5 g/dL - - -  Total Bilirubin 0.20 - 1.20 mg/dL 0.93 0.71 0.54  Alkaline Phos 40 - 150 U/L 118 109 127  AST 5 - 34 U/L _1 ALT 0 - 55 U/L 30 27 33   CEA  Status: Finalresult Visible to patient:  Not Released Nextappt: 08/11/2014 at 09:40 AM in Radiology (AP-MM 1) Dx:  Metastatic cancer to brain           Ref Range 20moago  675mogo  87m41moo  66yr49yr     CEA 0.0 - 5.0 ng/mL 0.9 1.3 1.4 1.1       PATHOLOGY REPORT Diagnosis 03/03/2015 Breast, right, needle core biopsy, 1:30 o'clock, 20 cm from nipple - INVASIVE MAMMARY CARCINOMA, SEE COMMENT. Microscopic Comment Although the grade of tumor is best assessed at resection, with these biopsies, the invasive tumor is grade 1-2. E-cadherin immunostain and breast prognostic studies are pending and will be reported in an addendum. The case was reviewed with Dr HillAvis Epley concurs. (CRR:ecj 03/04/2015) Invasive tumor demonstrates strong diffuse E-cadherin  immunostain expression supporting a ductal phenotype. (CRR:ecj 03/05/2015)  Radiological Studies: 1. MRI of the head with and without IV contrast on 03/23/2011 showed a solitary enhancing mass in the left parietal lobe with surrounding white matter edema, most compatible with solitary metastatic deposit. The mass lesion measured 2.7 x 2.5 cm.  2. PET scan from 03/25/2011 showed resolution of the previously- identified pulmonary nodules with no residual hypermetabolic activity noted in the neck, chest, abdomen or pelvis to suggest active malignancy. There was a pericardial effusion and some subsegmental atelectasis in the lower lobes. There were also uterine fibroids and a low-density lesion in the anterior spleen that was stable and not associated with hypermetabolic activity.  3. MRI of the head with and without IV contrast showed Stealth protocol utilized to evaluate left parietal mass. The mass measured 3 x 3 x 2.4 cm with marked surrounding vasogenic edema.  4. MRI of the head with and without IV contrast  on 04/08/2011 showed postsurgical changes of tumor removal in the left parietal lobe and a postsurgical hematoma with enhancement. There was 3 mm of midline shift. No other metastatic deposits.  5. MRI of the head with and without IV contrast on 06/28/2011 showed a 3 cm area of restricted diffusion lying within the previous area of  left parietal surgical resection for colon cancer. There was enhancement of the peripheral aspect of the cavity, marked restricted diffusion and increasing vasogenic edema. There was concern about the development of a brain abscess.  6. MRI of the head with and without IV contrast on 07/29/2011 showed left parietal postoperative hematoma was smaller compared with the  prior study. There was no nodular enhancement seen to indicate tumor. No new enhancing lesions were seen. There was significant  improvement in the left parietal edema.  7. Digital screening mammogram was  negative on 08/02/2011.  8. An MRI of the head with and without IV contrast on 10/21/2011 showed development of gyriform enhancement circumferentially along the margins of the left parietal postoperative space/hematoma. Marked increase in vasogenic edema was seen. 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 There was further contraction of the hematoma/postoperative space in the left parietal cortical region, now measuring 17 x 19 mm in transverse diameter as opposed to 22 x 21 mm previously.  Left-to-right shift was 2 mm.  9. Nuclear medicine brain PET-CT scan carried out on 10/27/2011 showed hypermetabolic activity in the high left parietal lobe which corresponds to gyriform enhancement on comparison MRI from 10/21/2011. This was felt to be most consistent with recurrent  tumor.  10. Chest x-ray, 2 view from 11/04/2011, showed chronic cardiomegaly. Port-A-Cath was in place. No acute disease was present.  11. MRI of the head with and without IV contrast on 01/27/2012 showed findings that were felt to be consistent with progression of disease. This exam was compared with the MRI of 10/21/2011. It was recognized that the patient underwent surgical resection of the mass on 11/10/2011.  There was felt to be on the present exam residual peripheral enhancement of approximately 35 x 40 x 21 mm with infiltration of the cortex and deep white matter. It was felt that the enhancement had progressed, despite removal of the central focus. There was moderate vasogenic  edema throughout the left hemisphere but slightly decreased from the MRI from 10/21/2011. No new lesions were seen. There was mild atrophy with chronic microvascular ischemic change, but no midline shift. Major intracranial vascular structures were patent.  12. MRI of the brain with and without IV contrast on 05/04/2012 showed previous left parietal lobe surgery for resection of tumor and radiation  necrosis.  No new abnormalities were noted. 13. Chest x-ray, 2 view, on 07/03/2012 showed enlargement of the cardiac silhouette with some lingular scarring, as compared with the chest x-ray from 11/04/2011. 14. 2-D echocardiogram from 07/12/2012 showed left ventricular ejection fraction of 55-60%.  There was a small to moderate circumferential pericardial effusion with no signs of tamponade.  There was felt to be no significant change from the 2-D echocardiogram dated 08/13/2009. 15. Digital bilateral screening mammogram on 08/06/2012 was negative. 16. 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  There was no progression or new brain metastasis identified. 17. PET scan from 10/16/2012 showed no specific features to suggest metastatic disease.  There was diffuse uptake throughout the thyroid gland, possibly due to hyperthyroidism. 18. MRI of the brain with and without IV contrast on 03/12/2013 showed residual gyriform enhancement surrounding the area of previous surgical resection of a left parietal metastasis with subsequent resection of radionecrosis. The findings likely represent some residual brain injury, but there is no significant progression of radionecrosis and no new lesions seen. 19. MRI of the brain with and without IV contrast on 11/01/2013 showed Stable posttherapy appearance of the brain since May of 2014. No progression or new brain metastasis identified. 20. CT chest,abomen and pelvis with contrast. 1. No evidence of thoracic metastasis.  2. Small prevascular lymph node is similar prior. 3. Stable pericardial effusion. 4. Interval increase in the volume presacral lymph node in the upper pelvis. This has progressed slowly from 2011. Recommend attention on follow-up versus restaging FDG PET scan. 5. Stable anastomosis in the descending  colon. 21. CT chest, abdomen and pelvis with contrast 07/126/2015. 1. No evidence of local colon cancer recurrence or metastasis within the abdomen or pelvis. 2. Retention of stool at the colocolonic anastomosis in the left colon is unchanged comparison exam. No obstructing lesion identified.  3. Mesenteric lymph node is again demonstrated with no increased size. 4. Stable splenic lesion likely representing a complex cysts or hemangioma. 5. Stable small pericardial effusion  Brain MRI 11/11/2014 IMPRESSION: Stable and satisfactory posttherapy appearance of the brain since 2014 and 2015. No new brain metastasis identified.  CT chest, abdomen and pelvis w contrast 02/02/2015  IMPRESSION: 1. No new or progressive findings to suggest recurrent colorectal cancer in the chest, abdomen, or pelvis. 2. 16 mm soft tissue nodule in the medial right breast. Correlation with mammographic history recommended. 3. Fibroid change in the uterus. 4. Stable hypodense lesion in the anterior spleen, compatible with complex cyst or hemangioma. 5. Stable appearance of the trace pericardial effusion.  Diagnostic mammogram and ultrasound of right breast 02/27/2015. FINDINGS: CC and MLO images as well as cleavage view of the right breast were obtained. These demonstrate an incompletely visualized approximate 2.5 cm irregular mass within the far posterior upper inner right breast.  Mammographic images were processed with CAD.  On physical exam, I palpate a small mass within the upper inner right breast.  Targeted ultrasound is performed, showing a 1.9 x 1.0 x 0.8 cm irregular hypoechoic mass within the right breast 130 o'clock 15 cm from the nipple with a peripheral rim of echogenicity. There is internal color flow. No right axillary adenopathy.  IMPRESSION: Suspicious right breast mass.  Impression and Plan: 64 year old African-American female, postmenopausal  1. Right breat invasive ductal  carcinoma, cT2N0M0, stage IIA, ER+/PR+, HER2 pending  -I reviewed her mammograms, ultrasound findings and the breast mass biopsy results in great detail. -Apparently she has early-stage breast cancer, which can be cured by complete surgical resection. Given her no evidence of colon cancer recurrence, I think it's reasonable to proceed with the surgery. She is going to see Dr. Orpah Melter this afternoon. -Given this strong ER/PR positivity, I recommend adjuvant endocrine therapy with aromatase inhibitor. -She probably would benefit from adjuvant breast radiation also. This will be followed up by her radiation oncologist Dr. Tammi Klippel -She has a restaging brain MRI scheduled next week, and a follow-up with Dr. Tammi Klippel after the scan. Of course if the brain MRI shows disease recurrence, we may postpone or change her breast cancer treatment. -I also discussed the role of adjuvant chemotherapy, if she has HER-2 positive disease, or lymph  node metastasis, or high risk recurrence score on Oncotype test. I will send Oncotype DX test if she has node negative and HER2 negative disease, and she agrees with the plan.  2. Stage IV Colon Cancer mets to lung and brain, NED --Terecia continues to do well with no evidence for disease recurrence.  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   -She is clinically doing very well. Her CEA level has been normal.  - her last brain MRI in April 2016  Was negative for recurrence, she is scheduled for repeat MRI next week -her recent restaging CT scans in 01/2015 showed no evidence of recurrence - she is clinically doing well, physical exam is unremarkable, we'll continue observation. -follow up with lab CBC, CMP and CEA every 3 months.  3. Port -She will continue fresh her port every 6 weeks   4. Peripheral neuropathy, grade 1, secondary to chemotherapy  -stable  5. Genetics -Given her  positive family and personal history of breast (Mother at age of 50 and cousin) and colon cancer (cousin),  I'll refer her to genetic counselor  She will continue follow-up with her primary care physician for other medical problems  ll questions were answered. The patient knows to call the clinic with any problems, questions or concerns. We can certainly see the patient much sooner if necessary. I plan to see her back in 2 weeks after her breast surgery.  I spent 30 minutes counseling the patient face to face. The total time spent in the appointment was 40 minutes.   Truitt Merle  03/06/2015

## 2015-03-09 ENCOUNTER — Other Ambulatory Visit: Payer: Self-pay | Admitting: General Surgery

## 2015-03-09 ENCOUNTER — Ambulatory Visit: Payer: Medicare HMO | Admitting: Hematology

## 2015-03-09 DIAGNOSIS — C50911 Malignant neoplasm of unspecified site of right female breast: Secondary | ICD-10-CM

## 2015-03-10 ENCOUNTER — Other Ambulatory Visit: Payer: Self-pay | Admitting: General Surgery

## 2015-03-10 DIAGNOSIS — C50911 Malignant neoplasm of unspecified site of right female breast: Secondary | ICD-10-CM

## 2015-03-12 ENCOUNTER — Ambulatory Visit
Admission: RE | Admit: 2015-03-12 | Discharge: 2015-03-12 | Disposition: A | Discharge status: Home / Self Care | Payer: Medicare HMO | Source: Ambulatory Visit | Attending: Radiation Oncology | Admitting: Radiation Oncology

## 2015-03-12 DIAGNOSIS — C7949 Secondary malignant neoplasm of other parts of nervous system: Secondary | ICD-10-CM

## 2015-03-12 DIAGNOSIS — C50911 Malignant neoplasm of unspecified site of right female breast: Secondary | ICD-10-CM | POA: Diagnosis not present

## 2015-03-12 DIAGNOSIS — C7931 Secondary malignant neoplasm of brain: Secondary | ICD-10-CM

## 2015-03-12 LAB — BUN AND CREATININE (CC13)
BUN: 15.1 mg/dL (ref 7.0–26.0)
CREATININE: 0.9 mg/dL (ref 0.6–1.1)
EGFR: 85 mL/min/{1.73_m2} — AB (ref >90)

## 2015-03-13 ENCOUNTER — Ambulatory Visit
Admission: RE | Admit: 2015-03-13 | Discharge: 2015-03-13 | Disposition: A | Discharge status: Home / Self Care | Payer: Medicare HMO | Source: Ambulatory Visit | Attending: Radiation Oncology | Admitting: Radiation Oncology

## 2015-03-13 DIAGNOSIS — C7949 Secondary malignant neoplasm of other parts of nervous system: Principal | ICD-10-CM

## 2015-03-13 DIAGNOSIS — C7931 Secondary malignant neoplasm of brain: Secondary | ICD-10-CM

## 2015-03-13 MED ORDER — GADOBENATE DIMEGLUMINE 529 MG/ML IV SOLN
17.0000 mL | Freq: Once | INTRAVENOUS | Status: AC | PRN
  Administered 2015-03-13: 17 mL via INTRAVENOUS

## 2015-03-18 ENCOUNTER — Ambulatory Visit (HOSPITAL_BASED_OUTPATIENT_CLINIC_OR_DEPARTMENT_OTHER): Payer: Medicare HMO

## 2015-03-18 ENCOUNTER — Encounter: Payer: Self-pay | Admitting: Genetic Counselor

## 2015-03-18 ENCOUNTER — Other Ambulatory Visit: Payer: Medicare HMO

## 2015-03-18 ENCOUNTER — Ambulatory Visit
Admission: RE | Admit: 2015-03-18 | Discharge: 2015-03-18 | Disposition: A | Discharge status: Home / Self Care | Payer: Medicare HMO | Source: Ambulatory Visit | Attending: Radiation Oncology | Admitting: Radiation Oncology

## 2015-03-18 ENCOUNTER — Ambulatory Visit (HOSPITAL_BASED_OUTPATIENT_CLINIC_OR_DEPARTMENT_OTHER): Payer: Medicare HMO | Admitting: Genetic Counselor

## 2015-03-18 ENCOUNTER — Encounter: Payer: Self-pay | Admitting: Radiation Oncology

## 2015-03-18 VITALS — BP 129/85 | HR 88 | Resp 16 | Wt 186.6 lb

## 2015-03-18 VITALS — BP 133/78 | HR 86 | Temp 98.1°F | Resp 16

## 2015-03-18 DIAGNOSIS — Z95828 Presence of other vascular implants and grafts: Secondary | ICD-10-CM

## 2015-03-18 DIAGNOSIS — Z800 Family history of malignant neoplasm of digestive organs: Secondary | ICD-10-CM

## 2015-03-18 DIAGNOSIS — Z315 Encounter for genetic counseling: Secondary | ICD-10-CM | POA: Diagnosis not present

## 2015-03-18 DIAGNOSIS — Z452 Encounter for adjustment and management of vascular access device: Secondary | ICD-10-CM

## 2015-03-18 DIAGNOSIS — Z803 Family history of malignant neoplasm of breast: Secondary | ICD-10-CM

## 2015-03-18 DIAGNOSIS — Z85038 Personal history of other malignant neoplasm of large intestine: Secondary | ICD-10-CM

## 2015-03-18 DIAGNOSIS — C50211 Malignant neoplasm of upper-inner quadrant of right female breast: Secondary | ICD-10-CM | POA: Diagnosis not present

## 2015-03-18 DIAGNOSIS — C7931 Secondary malignant neoplasm of brain: Secondary | ICD-10-CM

## 2015-03-18 DIAGNOSIS — C7949 Secondary malignant neoplasm of other parts of nervous system: Principal | ICD-10-CM

## 2015-03-18 MED ORDER — SODIUM CHLORIDE 0.9 % IJ SOLN
10.0000 mL | INTRAMUSCULAR | Status: DC | PRN
Start: 2015-03-18 — End: 2015-03-18
  Administered 2015-03-18: 10 mL via INTRAVENOUS
  Filled 2015-03-18: qty 10

## 2015-03-18 MED ORDER — HEPARIN SOD (PORK) LOCK FLUSH 100 UNIT/ML IV SOLN
500.0000 [IU] | Freq: Once | INTRAVENOUS | Status: AC
  Administered 2015-03-18: 500 [IU] via INTRAVENOUS
  Filled 2015-03-18: qty 5

## 2015-03-18 NOTE — Patient Instructions (Signed)

## 2015-03-18 NOTE — Progress Notes (Addendum)
Weight and vitals stable. Denies pain. Denies headaches. Reports occasional she feels light headed questions if it is related to sinuses. Denies ringing in her ears but, does report the right ear feels "stopped up at times." Denies visual changes such as floaters or diplopia. Denies taking decadron. Denies changes in coordination. Steady gait noted. Responds appropriately and quickly to questions asked. Oriented to person, place, and time. Diagnosed with invasive right breast ca. Genetic counseling scheduled for today.    BP 129/85 mmHg  Pulse 88  Resp 16  Wt 186 lb 9.6 oz (84.641 kg) Wt Readings from Last 3 Encounters:  03/18/15 186 lb 9.6 oz (84.641 kg)  03/13/15 185 lb (83.915 kg)  03/06/15 184 lb 9.6 oz (83.734 kg)

## 2015-03-18 NOTE — Progress Notes (Signed)
Radiation Oncology         [phone]   Name: Kayla [name]   Date: 03/18/2015   MRN: [id number]  DOB: [birth year]    Multidisciplinary Brain and Spine Oncology Clinic Follow-Up Visit Note  CC: No PCP Per Patient  Kayla Gains, DO    ICD-9-CM ICD-10-CM   1. 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  The recent films were presented in our multidisciplinary conference with neuroradiology just prior to the clinic.  Here to review recent MRI report. Weight and vitals stable. Denies pain. Denies headaches. Reports occasional she feels light headed questions if it is related to sinuses. Denies ringing in her ears but, does report the right ear feels "stopped up at times." Denies visual changes such as floaters or diplopia. Denies taking decadron. Denies changes in coordination. Steady gait noted. Responds appropriately and quickly to questions asked. Oriented to person, place, and time. Diagnosed with invasive right breast ca. Genetic counseling scheduled for today. Right breast lumpectomy with sentinel lymph node biopsy scheduled for 04/14/15 with Dr. Zella Richer.                             ALLERGIES:  is allergic to sulfonamide derivatives.  Meds: Current Outpatient Prescriptions  Medication Sig Dispense Refill  . acetaminophen (TYLENOL) 500 MG tablet Take 1,000 mg by mouth every 6 (six) hours as needed.    . fluticasone (FLONASE) 50 MCG/ACT nasal spray Place 1 spray into both nostrils daily.    [name] gabapentin (NEURONTIN) 300 MG capsule TAKE ONE CAPSULE BY MOUTH AT BEDTIME 90 capsule 0  . ibuprofen (ADVIL,MOTRIN) 200 MG tablet Take 200 mg by mouth every 8 (eight) hours as needed. For pain    . lisinopril  (PRINIVIL,ZESTRIL) 40 MG tablet Take 1 tablet (40 mg total) by mouth daily. 90 tablet 0  . lidocaine-prilocaine (EMLA) cream Apply topically as needed.     No current facility-administered medications for this encounter.    Physical Findings: The patient is in no acute distress. Patient is alert and oriented.  No significant changes.  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 Restaging. Subsequent encounter.  EXAM: MRI HEAD WITHOUT AND WITH CONTRAST  TECHNIQUE: Multiplanar, multiecho pulse sequences of the brain and surrounding structures were obtained without and with intravenous contrast.  CONTRAST:  22m MULTIHANCE GADOBENATE DIMEGLUMINE 529 MG/ML IV SOLN  COMPARISON:  11/11/2014 and earlier.  FINDINGS: Stable cerebral volume. Post treatment changes in the left parietal lobe with overlying craniotomy re- demonstrated. Stable residual architectural distortion, hemosiderin, and T2 hyperintensity. No mass effect or increased FLAIR hyperintensity. Following contrast stable enhancement, and mild dural thickening underlying the craniotomy site.  No abnormal enhancement elsewhere.  No restricted diffusion to suggest acute infarction. No  midline shift, mass effect, ventriculomegaly, extra-axial collection or acute intracranial hemorrhage. Cervicomedullary junction and pituitary are within normal limits. Major intracranial vascular flow voids are stable. Stable scattered nonspecific T2 and FLAIR cerebral white matter hyperintensity elsewhere. No new signal abnormality.  Visualized cervical spine appear stable and negative. Bone marrow signal is stable. Orbit  and scalp soft tissues, paranasal sinuses and mastoids are stable.  IMPRESSION: Continued stable and satisfactory posttherapy appearance of the brain. No new metastasis or new intracranial abnormality.   Electronically Signed   By: Kayla [name] M.D.   On: 03/13/2015 11:30   US Breast Ltd Uni Right Inc Axilla  02/27/2015   CLINICAL DATA:  Patient for diagnostic mammography with mass in the right breast on prior CT.  EXAM: DIGITAL DIAGNOSTIC RIGHT MAMMOGRAM WITH 3D TOMOSYNTHESIS WITH CAD  ULTRASOUND RIGHT BREAST  COMPARISON:  Previous exam(s).  ACR Breast Density Category a: The breast tissue is almost entirely fatty.  FINDINGS: CC and MLO images as well as cleavage view of the right breast were obtained. These demonstrate an incompletely visualized approximate 2.5 cm irregular mass within the far posterior upper inner right breast.  Mammographic images were processed with CAD.  On physical exam, I palpate a small mass within the upper inner right breast.  Targeted ultrasound is performed, showing a 1.9 x 1.0 x 0.8 cm irregular hypoechoic mass within the right breast 130 o'clock 15 cm from the nipple with a peripheral rim of echogenicity. There is internal color flow. No right axillary adenopathy.  IMPRESSION: Suspicious right breast mass.  RECOMMENDATION: Ultrasound-guided core needle biopsy right breast mass.  Biopsy scheduled for 03/03/2015.  I have discussed the findings and recommendations with the patient. Results were also provided in writing at the conclusion of the visit. If applicable, a reminder letter will be sent to the patient regarding the next appointment.  BI-RADS CATEGORY  4: Suspicious.   Electronically Signed   By: [name] M.D.   On: 02/27/2015 13:41   Mm Diag Breast Tomo Uni Right  02/27/2015   CLINICAL DATA:  Patient for diagnostic mammography with mass in the right breast on prior CT.  EXAM: DIGITAL DIAGNOSTIC RIGHT MAMMOGRAM WITH 3D TOMOSYNTHESIS WITH CAD  ULTRASOUND RIGHT BREAST   COMPARISON:  Previous exam(s).  ACR Breast Density Category a: The breast tissue is almost entirely fatty.  FINDINGS: CC and MLO images as well as cleavage view of the right breast were obtained. These demonstrate an incompletely visualized approximate 2.5 cm irregular mass within the far posterior upper inner right breast.  Mammographic images were processed with CAD.  On physical exam, I palpate a small mass within the upper inner right breast.  Targeted ultrasound is performed, showing a 1.9 x 1.0 x 0.8 cm irregular hypoechoic mass within the right breast 130 o'clock 15 cm from the nipple with a peripheral rim of echogenicity. There is internal color flow. No right axillary adenopathy.  IMPRESSION: Suspicious right breast mass.  RECOMMENDATION: Ultrasound-guided core needle biopsy right breast mass.  Biopsy scheduled for 03/03/2015.  I have discussed the findings and recommendations with the patient. Results were also provided in writing at the conclusion of the visit. If applicable, a reminder letter will be sent to the patient regarding the next appointment.  BI-RADS CATEGORY  4: Suspicious.   Electronically Signed   By: [name] M.D.   On: 02/27/2015 13:41   [place] Rt Breast Bx W Loc Dev 1st Lesion Img Bx Spec US Guide  03/09/2015  ADDENDUM REPORT: 03/04/2015 15:23  ADDENDUM: Pathology reveals Grade I-II invasive mammary carcinoma. This was found to be concordant by Dr. Luberta Robertson. Pathology results were discussed with the patient via telephone. The patient reported no problems with the biopsy site and is doing well. Post biopsy instructions were reviewed and questions were answered. The patient was encouraged to call [facility] with any additional questions and or concerns. A surgical referral was arranged with Dr. Jackolyn Confer of [facility] on March 06, 2015. Pathology results also called to Dr.Yan Burr Medico who is currently treating the patient for metastatic  carcinoma.  Pathology results reported by Terie Purser RN on March 04, 2015.   Electronically Signed   By: Altamese Cabal M.D.   On: 03/04/2015 15:23   03/09/2015   CLINICAL DATA:  Right breast mass.  EXAM: ULTRASOUND GUIDED RIGHT BREAST CORE NEEDLE BIOPSY  COMPARISON:  Previous exam(s).  FINDINGS: I met with the patient and we discussed the procedure of ultrasound-guided biopsy, including benefits and alternatives. We discussed the high likelihood of a successful procedure. We discussed the risks of the procedure, including infection, bleeding, tissue injury, clip migration, and inadequate sampling. Informed written consent was given. The usual time-out protocol was performed immediately prior to the procedure.  Using sterile technique and 2% Lidocaine as local anesthetic, under direct ultrasound visualization, a 14 gauge spring-loaded device was used to perform biopsy of the mass located within the right breast at the 1:30 o'clock position in a parasternal location approximately 20 cm from the nipple using a medial approach. At the conclusion of the procedure a ribbon shaped tissue marker clip was deployed into the biopsy cavity. Follow up 2 view mammogram was performed and dictated separately.  IMPRESSION: Ultrasound guided biopsy of the right breast mass located at the 1:30 o'clock position 20 cm from the nipple in a parasternal location. No apparent complications.  As the mass is not visualized fully by mammography, a post clip placement mammogram was not performed.  Electronically Signed: By: Altamese Cabal M.D. On: 03/03/2015 11:26   Impression:  The patient has no active brain metastases and no radionecrosis. Recently diagnosed invasive right breast cancer, amenable to breast conservation.  Plan:  MRI in 4 months then follow-up.  We will re-discuss radiation treatment to the right breast pending genetics testing and surgery.  This document serves as a record of services personally performed by  Tyler Pita, MD. It was created on his behalf by Arlyce Harman, a trained medical scribe. The creation of this record is based on the scribe's personal observations and the provider's statements to them. This document has been checked and approved by the attending provider.      _____________________________________  Sheral Apley. Tammi Klippel, M.D.

## 2015-03-18 NOTE — Progress Notes (Signed)
REFERRING PROVIDER: Truitt Merle, MD  Tyler Pita, MD  PRIMARY PROVIDER:  No PCP Per Patient  PRIMARY REASON FOR VISIT:  1. Breast cancer of upper-inner quadrant of right female breast   2. History of colon cancer   3. Family history of breast cancer   4. Family history of colon cancer      HISTORY OF PRESENT ILLNESS:   [name], a 64 y.o. female, was seen for a [place] cancer genetics consultation at the request of Dr. Burr Medico due to a personal and family history of cancer.  [name] presents to clinic today to discuss the possibility of a hereditary predisposition to cancer, genetic testing, and to further clarify her future cancer risks, as well as potential cancer risks for family members.   In 2008, at the age of 34, [name] was diagnosed with cancer of the left colon. This was treated with surgery and chemotherapy.  In August 2016, Ms. Fosse was diagnosed with breast cancer.  She has surgery scheduled for 04/14/2015.  She is unsure if she is going to get chemotherapy.   CANCER HISTORY:  Oncology History   Breast cancer of upper-inner quadrant of right female breast   Staging form: Breast, AJCC 7th Edition     Clinical: Stage IIA (T2, N0, M0) - Unsigned       Breast cancer of upper-inner quadrant of right female breast   02/27/2015 Mammogram Diagnostic mammogram Showed a 2.5 cm irregular mass within the far posterior upper inner right breast, ultrasound confirmed a 1.9 x 1.0 x 0.8 cm mass at 1:30 o'clock 15 submitted from nipple. No axillary adenopathy   03/03/2015 Initial Diagnosis Breast cancer of upper-inner quadrant of right female breast   03/03/2015 Initial Biopsy Right breast needle biopsy showed invasive ductal carcinoma, grade 1-2.   03/03/2015 Receptors her2 ER 100%+, PR 80%+, ki67 15%     HORMONAL RISK FACTORS:  Menarche was at age 5.  First live birth at age 39.  OCP use for approximately 0 years.  Ovaries intact: yes.  Hysterectomy: no.  Menopausal  status: postmenopausal.  HRT use: 0 years. Colonoscopy: yes; last year one polyp was found. Mammogram within the last year: yes. Number of breast biopsies: 2. Up to date with pelvic exams:  yes. Any excessive radiation exposure in the past:  no  Past Medical History  Diagnosis Date  . Pericardial effusion     echo 08/13/09  . LVH (left ventricular hypertrophy)     mod/severe. echo 1/11. EF 65-70%   . Hypertension   . Thrombocytopenia   . 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 Allergy     sulfa  . Colon cancer     colon/ 2010/surg/chemo  . History of radiation therapy 04/15/11    17 Gy single fraction  l parietal brain metastais  . Heart murmur   . Peripheral vascular disease   . Shortness of breath   . Blood transfusion   . GERD (gastroesophageal reflux disease)   . Headache(784.0)   . Neuromuscular disorder     peripheral neuropathy feet  . Arthritis   . Diverticulosis 07/22/2010    sigmoid colon  . Family history of breast cancer   . Family history of colon cancer     Past Surgical History  Procedure Laterality Date  . Rotator cuff repair      rt  . Tonsillectomy    . Tubal ligation    . Craniotomy  03/31/11    left parietal mass resection  . Colostomy closure    . Tonsillectomy    . Colon surgery    . Portacath placement      10  . Craniotomy  11/10/2011    Procedure: CRANIOTOMY TUMOR EXCISION;  Surgeon: Erline Levine, MD;  Location: [facility];  Service: Neurosurgery;  Laterality: N/A;  Craniotomy for Biopsy of Tumor  . Colonoscopy  07/22/2010    Social History   Social History  . Marital Status: Divorced    Spouse Name: N/A  . Number of Children: N/A  . Years of Education: N/A   Social History Main Topics  . Smoking status: Former Smoker -- 0.25 packs/day for 37 years    Types: Cigarettes    Quit date: 11/03/2008  . Smokeless tobacco: Never Used     Comment: 30 pack year hx   . Alcohol Use: No     Comment: rarely  . Drug Use:  No  . Sexual Activity: Yes    Birth Control/ Protection: Post-menopausal   Other Topics Concern  . None   Social History Narrative   Full time- loader on truck    Single, 2 sons- 53 and 68.      FAMILY HISTORY:  We obtained a detailed, 4-generation family history.  Significant diagnoses are listed below: Family History  Problem Relation Age of Onset  . Breast cancer Mother 52  . Diabetes Father   . Coronary artery disease Father   . Gout Brother   . Breast cancer Cousin     dx in her 76s  . Colon cancer Cousin     mother's maternal first cousin  . Diabetes Maternal Aunt   . Cirrhosis Brother    The patient had two sons, one died in a MVS at 36.  She has five brotehrs and one sister.  Two brothers died of cirrhosis of the liver.  Her mother was diagnosed with breast cancer at 30 and died soon afterward.  She had a maternal half sister who died of diabetes complications.  The patient's maternal cousin was diagnosed with breast cancer in her 1s.  Her mother's maternal first cousin was diagnosed with colon cancer in her 27s.  The patient's father was diagnosed with diabetes and died in his 20s.  He had 10 siblings, none of whom had cancer.  Patient's maternal ancestors are of African American descent, and paternal ancestors are of African American descent. There is no reported Ashkenazi Jewish ancestry. There is no known consanguinity.  GENETIC COUNSELING ASSESSMENT: RIAN KOON is a 64 y.o. female with a personal history of colon and breast cancer and a family history of breast and colon cancer which somewhat suggestive of a hereditary cancer syndrome and predisposition to cancer. We, therefore, discussed and recommended the following at today's visit.   DISCUSSION: We discussed hereditary breast and colon cancer syndromes that are associated with early onset breast cancer as well as those associated with both colon and breast cancer.  This includes BRCA mutations, as well as CHEK2  and Lynch syndrome genes.  We reviewed the characteristics, features and inheritance patterns of hereditary cancer syndromes. We also discussed genetic testing, including the appropriate family members to test, the process of testing, insurance coverage and turn-around-time for results. We discussed the implications of a negative, positive and/or variant of uncertain significant result. We recommended Ms. Klenke pursue genetic testing for the Comprehensive Cancer gene panel. The Comprehensive Cancer Panel offered by [facility] includes  sequencing and/or deletion duplication testing of the following 32 genes: APC, ATM, AXIN2, BARD1, BMPR1A, BRCA1, BRCA2, BRIP1, CDH1, CDK4, CDKN2A, CHEK2, EPCAM, FANCC, MLH1, MSH2, MSH6, MUTYH, NBN, PALB2, PMS2, POLD1, POLE, PTEN, RAD51C, RAD51D, SCG5/GREM1, SMAD4, STK11, TP53, VHL, and XRCC2.     Based on Ms. Eline's personal and family history of cancer, she meets medical criteria for genetic testing. Despite that she meets criteria, she may still have an out of pocket cost. We discussed that if her out of pocket cost for testing is over $100, the laboratory will call and confirm whether she wants to proceed with testing.  If the out of pocket cost of testing is less than $100 she will be billed by the genetic testing laboratory.   PLAN: After considering the risks, benefits, and limitations, Ms. Wigington  provided informed consent to pursue genetic testing and the blood sample was sent to [facility] for analysis of the Comprehensive cancer panel. Results should be available within approximately 2-3 weeks' time, at which point they will be disclosed by telephone to Ms. Siddall, as will any additional recommendations warranted by these results. Ms. Karrer will receive a summary of her genetic counseling visit and a copy of her results once available. This information will also be available in Epic. We encouraged Ms. Mower to remain in contact with cancer genetics annually so that we  can continuously update the family history and inform her of any changes in cancer genetics and testing that may be of benefit for her family. Ms. Messineo questions were answered to her satisfaction today. Our contact information was provided should additional questions or concerns arise.  Lastly, we encouraged Ms. Mcgrory to remain in contact with cancer genetics annually so that we can continuously update the family history and inform her of any changes in cancer genetics and testing that may be of benefit for this family.   Ms.  Renier questions were answered to her satisfaction today. Our contact information was provided should additional questions or concerns arise. Thank you for the referral and allowing [place] to share in the care of your patient.   Jamaree Hosier P. Florene Glen, Bernice, [facility] Certified Genetic Counselor [email or web address] phone: [phone]  The patient was seen for a total of 30 minutes in face-to-face genetic counseling.  This patient was discussed with Drs. Magrinat, Lindi Adie and/or Burr Medico who agrees with the above.    _______________________________________________________________________ For Office Staff:  Number of people involved in session: 1 Was an Intern/ student involved with case: no

## 2015-04-01 ENCOUNTER — Ambulatory Visit: Payer: Self-pay | Admitting: Genetic Counselor

## 2015-04-01 ENCOUNTER — Encounter: Payer: Self-pay | Admitting: Genetic Counselor

## 2015-04-01 ENCOUNTER — Telehealth: Payer: Self-pay | Admitting: Genetic Counselor

## 2015-04-01 DIAGNOSIS — Z1379 Encounter for other screening for genetic and chromosomal anomalies: Secondary | ICD-10-CM

## 2015-04-01 DIAGNOSIS — C50211 Malignant neoplasm of upper-inner quadrant of right female breast: Secondary | ICD-10-CM

## 2015-04-01 DIAGNOSIS — Z803 Family history of malignant neoplasm of breast: Secondary | ICD-10-CM

## 2015-04-01 DIAGNOSIS — Z800 Family history of malignant neoplasm of digestive organs: Secondary | ICD-10-CM

## 2015-04-01 DIAGNOSIS — Z85038 Personal history of other malignant neoplasm of large intestine: Secondary | ICD-10-CM

## 2015-04-01 NOTE — Progress Notes (Signed)
HPI: Kayla [name] was previously seen in the [place] [facility] due to a personal and family history of breast and colon cancer and concerns regarding a hereditary predisposition to cancer. Please refer to our prior cancer genetics clinic note for more information regarding Kayla [name]'s medical, social and family histories, and our assessment and recommendations, at the time. Kayla [name] recent genetic test results were disclosed to her, as were recommendations warranted by these results. These results and recommendations are discussed in more detail below.  FAMILY HISTORY:  We obtained a detailed, 4-generation family history.  Significant diagnoses are listed below: Family History  Problem Relation Age of Onset  . Breast cancer Mother 56  . Diabetes Father   . Coronary artery disease Father   . Gout Brother   . Breast cancer Cousin     dx in her 19s  . Colon cancer Cousin     mother's maternal first cousin  . Diabetes Maternal Aunt   . Cirrhosis Brother     The patient had two sons, one died in a MVS at 67. She has five brotehrs and one sister. Two brothers died of cirrhosis of the liver. Her mother was diagnosed with breast cancer at 75 and died soon afterward. She had a maternal half sister who died of diabetes complications. The patient's maternal cousin was diagnosed with breast cancer in her 5s. Her mother's maternal first cousin was diagnosed with colon cancer in her 67s. The patient's father was diagnosed with diabetes and died in his 39s. He had 10 siblings, none of whom had cancer. Patient's maternal ancestors are of African American descent, and paternal ancestors are of African American descent. There is no reported Ashkenazi Jewish ancestry. There is no known consanguinity.  GENETIC TEST RESULTS: At the time of Kayla [name]'s visit, we recommended she pursue genetic testing of the Comprehensive Cancer gene panel. The Comprehensive Cancer Panel offered by [facility]  includes sequencing and/or deletion duplication testing of the following 32 genes: APC, ATM, AXIN2, BARD1, BMPR1A, BRCA1, BRCA2, BRIP1, CDH1, CDK4, CDKN2A, CHEK2, EPCAM, FANCC, MLH1, MSH2, MSH6, MUTYH, NBN, PALB2, PMS2, POLD1, POLE, PTEN, RAD51C, RAD51D, SCG5/GREM1, SMAD4, STK11, TP53, VHL, and XRCC2.     The report date is March 30, 2015.  Genetic testing was normal, and did not reveal a deleterious mutation in these genes. The test report has been scanned into EPIC and is located under the Molecular Pathology section of the Results Review tab.   We discussed with Kayla [name] that since the current genetic testing is not perfect, it is possible there may be a gene mutation in one of these genes that current testing cannot detect, but that chance is small. We also discussed, that it is possible that another gene that has not yet been discovered, or that we have not yet tested, is responsible for the cancer diagnoses in the family, and it is, therefore, important to remain in touch with cancer genetics in the future so that we can continue to offer Kayla [name] the most up to date genetic testing.   Genetic testing did detect a Variant of Unknown Significance in the POLD1 gene called c.327G>C. At this time, it is unknown if this variant is associated with increased cancer risk or if this is a normal finding, but most variants such as this get reclassified to being inconsequential. This is not a clinically actionable result. It should not be used to make medical management decisions. With time, we suspect the lab will  determine the significance of this variant, if any. If we do learn more about it, we will try to contact Kayla [name] to discuss it further. However, it is important to stay in touch with [place] periodically and keep the address and phone number up to date.   CANCER SCREENING RECOMMENDATIONS: Given Kayla [name]'s personal and family histories, we must interpret these negative results with some caution.   Families with features suggestive of hereditary risk for cancer tend to have multiple family members with cancer, diagnoses in multiple generations and diagnoses before the age of 80. Kayla [name] family exhibits some of these features. Thus this result may simply reflect our current inability to detect all mutations within these genes or there may be a different gene that has not yet been discovered or tested.   RECOMMENDATIONS FOR FAMILY MEMBERS: Women in this family might be at some increased risk of developing cancer, over the general population risk, simply due to the family history of cancer. We recommended women in this family have a yearly mammogram beginning at age 42, or 66 years younger than the earliest onset of cancer, an an annual clinical breast exam, and perform monthly breast self-exams. Women in this family should also have a gynecological exam as recommended by their primary provider. All family members should have a colonoscopy by age 30.  FOLLOW-UP: Lastly, we discussed with Kayla [name] that cancer genetics is a rapidly advancing field and it is possible that new genetic tests will be appropriate for her and/or her family members in the future. We encouraged her to remain in contact with cancer genetics on an annual basis so we can update her personal and family histories and let her know of advances in cancer genetics that may benefit this family.   Our contact number was provided. Kayla [name] questions were answered to her satisfaction, and she knows she is welcome to call us at anytime with additional questions or concerns.   Kayla Kayser, MS, [facility] Certified Genetic Counselor [name]

## 2015-04-01 NOTE — Telephone Encounter (Signed)
Revealed negative genetic testing.  Did discuss the VUS finding and reminded her that most VUS are reclassified as benign.  She voiced her understanding.

## 2015-04-09 ENCOUNTER — Other Ambulatory Visit: Payer: Self-pay | Admitting: General Surgery

## 2015-04-09 ENCOUNTER — Encounter (HOSPITAL_BASED_OUTPATIENT_CLINIC_OR_DEPARTMENT_OTHER): Payer: Self-pay | Admitting: *Deleted

## 2015-04-09 ENCOUNTER — Ambulatory Visit
Admission: RE | Admit: 2015-04-09 | Discharge: 2015-04-09 | Disposition: A | Discharge status: Home / Self Care | Payer: Medicare HMO | Source: Ambulatory Visit | Attending: General Surgery | Admitting: General Surgery

## 2015-04-09 ENCOUNTER — Encounter (HOSPITAL_BASED_OUTPATIENT_CLINIC_OR_DEPARTMENT_OTHER)
Admission: RE | Admit: 2015-04-09 | Discharge: 2015-04-09 | Disposition: A | Discharge status: Home / Self Care | Payer: Medicare HMO | Source: Ambulatory Visit | Attending: General Surgery | Admitting: General Surgery

## 2015-04-09 ENCOUNTER — Encounter: Payer: Self-pay | Admitting: General Surgery

## 2015-04-09 DIAGNOSIS — C50911 Malignant neoplasm of unspecified site of right female breast: Secondary | ICD-10-CM

## 2015-04-09 DIAGNOSIS — Z01818 Encounter for other preprocedural examination: Secondary | ICD-10-CM | POA: Insufficient documentation

## 2015-04-09 DIAGNOSIS — C50211 Malignant neoplasm of upper-inner quadrant of right female breast: Secondary | ICD-10-CM | POA: Diagnosis not present

## 2015-04-09 LAB — BASIC METABOLIC PANEL
Anion gap: 8 (ref 5–15)
BUN: 12 mg/dL (ref 6–20)
CO2: 29 mmol/L (ref 22–32)
Calcium: 9.6 mg/dL (ref 8.9–10.3)
Chloride: 105 mmol/L (ref 101–111)
Creatinine, Ser: 0.87 mg/dL (ref 0.44–1.00)
Glucose, Bld: 96 mg/dL (ref 65–99)
POTASSIUM: 4.2 mmol/L (ref 3.5–5.1)
SODIUM: 142 mmol/L (ref 135–145)

## 2015-04-14 ENCOUNTER — Ambulatory Visit (HOSPITAL_COMMUNITY): Payer: Medicare HMO

## 2015-04-14 ENCOUNTER — Ambulatory Visit
Admission: RE | Admit: 2015-04-14 | Discharge: 2015-04-14 | Disposition: A | Discharge status: Home / Self Care | Payer: Medicare HMO | Source: Ambulatory Visit | Attending: General Surgery | Admitting: General Surgery

## 2015-04-14 ENCOUNTER — Encounter (HOSPITAL_BASED_OUTPATIENT_CLINIC_OR_DEPARTMENT_OTHER): Payer: Self-pay

## 2015-04-14 ENCOUNTER — Ambulatory Visit (HOSPITAL_BASED_OUTPATIENT_CLINIC_OR_DEPARTMENT_OTHER): Payer: Medicare HMO | Admitting: Anesthesiology

## 2015-04-14 ENCOUNTER — Ambulatory Visit (HOSPITAL_COMMUNITY)
Admission: RE | Admit: 2015-04-14 | Discharge: 2015-04-14 | Disposition: A | Discharge status: Home / Self Care | Payer: Medicare HMO | Source: Ambulatory Visit | Attending: General Surgery | Admitting: General Surgery

## 2015-04-14 ENCOUNTER — Ambulatory Visit (HOSPITAL_BASED_OUTPATIENT_CLINIC_OR_DEPARTMENT_OTHER)
Admission: RE | Admit: 2015-04-14 | Discharge: 2015-04-14 | Disposition: A | Discharge status: Home / Self Care | Payer: Medicare HMO | Source: Ambulatory Visit | Attending: General Surgery | Admitting: General Surgery

## 2015-04-14 ENCOUNTER — Encounter (HOSPITAL_BASED_OUTPATIENT_CLINIC_OR_DEPARTMENT_OTHER)
Admission: RE | Disposition: A | Discharge status: Home / Self Care | Payer: Self-pay | Source: Ambulatory Visit | Attending: General Surgery

## 2015-04-14 DIAGNOSIS — Z6831 Body mass index (BMI) 31.0-31.9, adult: Secondary | ICD-10-CM | POA: Diagnosis not present

## 2015-04-14 DIAGNOSIS — J449 Chronic obstructive pulmonary disease, unspecified: Secondary | ICD-10-CM | POA: Diagnosis not present

## 2015-04-14 DIAGNOSIS — Z800 Family history of malignant neoplasm of digestive organs: Secondary | ICD-10-CM | POA: Diagnosis not present

## 2015-04-14 DIAGNOSIS — C50911 Malignant neoplasm of unspecified site of right female breast: Secondary | ICD-10-CM

## 2015-04-14 DIAGNOSIS — D0511 Intraductal carcinoma in situ of right breast: Secondary | ICD-10-CM | POA: Diagnosis not present

## 2015-04-14 DIAGNOSIS — I10 Essential (primary) hypertension: Secondary | ICD-10-CM | POA: Diagnosis not present

## 2015-04-14 DIAGNOSIS — Z87891 Personal history of nicotine dependence: Secondary | ICD-10-CM | POA: Diagnosis not present

## 2015-04-14 DIAGNOSIS — Z803 Family history of malignant neoplasm of breast: Secondary | ICD-10-CM | POA: Insufficient documentation

## 2015-04-14 DIAGNOSIS — Z85038 Personal history of other malignant neoplasm of large intestine: Secondary | ICD-10-CM | POA: Insufficient documentation

## 2015-04-14 HISTORY — PX: BREAST LUMPECTOMY WITH RADIOACTIVE SEED AND SENTINEL LYMPH NODE BIOPSY: SHX6550

## 2015-04-14 HISTORY — PX: BREAST LUMPECTOMY: SHX2

## 2015-04-14 LAB — POCT HEMOGLOBIN-HEMACUE: HEMOGLOBIN: 12.7 g/dL (ref 12.0–15.0)

## 2015-04-14 SURGERY — BREAST LUMPECTOMY WITH RADIOACTIVE SEED AND SENTINEL LYMPH NODE BIOPSY
Anesthesia: General | Laterality: Right

## 2015-04-14 MED ORDER — GLYCOPYRROLATE 0.2 MG/ML IJ SOLN
INTRAMUSCULAR | Status: AC
  Filled 2015-04-14: qty 1

## 2015-04-14 MED ORDER — OXYCODONE HCL 5 MG PO TABS: 5.0000 mg | ORAL_TABLET | ORAL | Status: DC | PRN

## 2015-04-14 MED ORDER — CEFAZOLIN SODIUM-DEXTROSE 2-3 GM-% IV SOLR: 2.0000 g | INTRAVENOUS | Status: DC

## 2015-04-14 MED ORDER — SUCCINYLCHOLINE CHLORIDE 20 MG/ML IJ SOLN
INTRAMUSCULAR | Status: AC
  Filled 2015-04-14: qty 1

## 2015-04-14 MED ORDER — BUPIVACAINE HCL (PF) 0.5 % IJ SOLN
INTRAMUSCULAR | Status: AC
  Filled 2015-04-14: qty 30

## 2015-04-14 MED ORDER — FENTANYL CITRATE (PF) 100 MCG/2ML IJ SOLN
INTRAMUSCULAR | Status: AC
  Filled 2015-04-14: qty 2

## 2015-04-14 MED ORDER — LACTATED RINGERS IV SOLN
INTRAVENOUS | Status: DC
  Administered 2015-04-14 (×2): via INTRAVENOUS

## 2015-04-14 MED ORDER — SCOPOLAMINE 1 MG/3DAYS TD PT72: 1.0000 | MEDICATED_PATCH | Freq: Once | TRANSDERMAL | Status: DC | PRN

## 2015-04-14 MED ORDER — TECHNETIUM TC 99M SULFUR COLLOID FILTERED
1.0000 | Freq: Once | INTRAVENOUS | Status: AC | PRN
Start: 2015-04-14 — End: 2015-04-14
  Administered 2015-04-14: 1 via INTRADERMAL

## 2015-04-14 MED ORDER — DEXTROSE 5 % IV SOLN
3.0000 g | INTRAVENOUS | Status: AC
  Administered 2015-04-14: 2 g via INTRAVENOUS

## 2015-04-14 MED ORDER — CEFAZOLIN SODIUM-DEXTROSE 2-3 GM-% IV SOLR
INTRAVENOUS | Status: AC
  Filled 2015-04-14: qty 50

## 2015-04-14 MED ORDER — MIDAZOLAM HCL 2 MG/2ML IJ SOLN
INTRAMUSCULAR | Status: AC
  Filled 2015-04-14: qty 4

## 2015-04-14 MED ORDER — MIDAZOLAM HCL 2 MG/2ML IJ SOLN: 0.5000 mg | Freq: Once | INTRAMUSCULAR | Status: DC | PRN

## 2015-04-14 MED ORDER — BUPIVACAINE HCL (PF) 0.5 % IJ SOLN
INTRAMUSCULAR | Status: DC | PRN
  Administered 2015-04-14: 10 mL

## 2015-04-14 MED ORDER — HYDROMORPHONE HCL 1 MG/ML IJ SOLN: 0.2500 mg | INTRAMUSCULAR | Status: DC | PRN

## 2015-04-14 MED ORDER — EPHEDRINE SULFATE 50 MG/ML IJ SOLN
INTRAMUSCULAR | Status: AC
  Filled 2015-04-14: qty 1

## 2015-04-14 MED ORDER — FENTANYL CITRATE (PF) 100 MCG/2ML IJ SOLN
50.0000 ug | INTRAMUSCULAR | Status: DC | PRN
  Administered 2015-04-14 (×2): 100 ug via INTRAVENOUS

## 2015-04-14 MED ORDER — MIDAZOLAM HCL 2 MG/2ML IJ SOLN
INTRAMUSCULAR | Status: AC
  Filled 2015-04-14: qty 2

## 2015-04-14 MED ORDER — PROPOFOL 10 MG/ML IV BOLUS
INTRAVENOUS | Status: DC | PRN
  Administered 2015-04-14: 150 mg via INTRAVENOUS

## 2015-04-14 MED ORDER — MEPERIDINE HCL 25 MG/ML IJ SOLN: 6.2500 mg | INTRAMUSCULAR | Status: DC | PRN

## 2015-04-14 MED ORDER — EPHEDRINE SULFATE 50 MG/ML IJ SOLN
INTRAMUSCULAR | Status: DC | PRN
  Administered 2015-04-14: 10 mg via INTRAVENOUS

## 2015-04-14 MED ORDER — ONDANSETRON HCL 4 MG/2ML IJ SOLN
INTRAMUSCULAR | Status: AC
  Filled 2015-04-14: qty 2

## 2015-04-14 MED ORDER — PROMETHAZINE HCL 25 MG/ML IJ SOLN: 6.2500 mg | INTRAMUSCULAR | Status: DC | PRN

## 2015-04-14 MED ORDER — GLYCOPYRROLATE 0.2 MG/ML IJ SOLN: 0.2000 mg | Freq: Once | INTRAMUSCULAR | Status: DC | PRN

## 2015-04-14 MED ORDER — MIDAZOLAM HCL 2 MG/2ML IJ SOLN
1.0000 mg | INTRAMUSCULAR | Status: DC | PRN
  Administered 2015-04-14: 2 mg via INTRAVENOUS
  Administered 2015-04-14: 1 mg via INTRAVENOUS

## 2015-04-14 MED ORDER — DEXAMETHASONE SODIUM PHOSPHATE 10 MG/ML IJ SOLN
INTRAMUSCULAR | Status: AC
  Filled 2015-04-14: qty 1

## 2015-04-14 MED ORDER — PROPOFOL 500 MG/50ML IV EMUL
INTRAVENOUS | Status: AC
  Filled 2015-04-14: qty 50

## 2015-04-14 MED ORDER — FENTANYL CITRATE (PF) 100 MCG/2ML IJ SOLN
INTRAMUSCULAR | Status: AC
  Filled 2015-04-14: qty 4

## 2015-04-14 MED ORDER — DEXAMETHASONE SODIUM PHOSPHATE 4 MG/ML IJ SOLN
INTRAMUSCULAR | Status: DC | PRN
  Administered 2015-04-14: 10 mg via INTRAVENOUS

## 2015-04-14 MED ORDER — LIDOCAINE HCL (CARDIAC) 20 MG/ML IV SOLN
INTRAVENOUS | Status: DC | PRN
  Administered 2015-04-14: 30 mg via INTRAVENOUS

## 2015-04-14 SURGICAL SUPPLY — 59 items
APL SKNCLS STERI-STRIP NONHPOA (GAUZE/BANDAGES/DRESSINGS) ×1
APPLIER CLIP 11 MED OPEN (CLIP)
APR CLP MED 11 20 MLT OPN (CLIP)
BANDAGE ELASTIC 6 VELCRO ST LF (GAUZE/BANDAGES/DRESSINGS) IMPLANT
BENZOIN TINCTURE PRP APPL 2/3 (GAUZE/BANDAGES/DRESSINGS) ×2 IMPLANT
BINDER BREAST XXLRG (GAUZE/BANDAGES/DRESSINGS) ×1 IMPLANT
BLADE SURG 15 STRL LF DISP TIS (BLADE) ×1 IMPLANT
BLADE SURG 15 STRL SS (BLADE) ×2
CANISTER SUCT 1200ML W/VALVE (MISCELLANEOUS) ×2 IMPLANT
CHLORAPREP W/TINT 26ML (MISCELLANEOUS) ×2 IMPLANT
CLIP APPLIE 11 MED OPEN (CLIP) IMPLANT
COVER BACK TABLE 60X90IN (DRAPES) ×2 IMPLANT
COVER MAYO STAND STRL (DRAPES) ×2 IMPLANT
COVER PROBE W GEL 5X96 (DRAPES) ×2 IMPLANT
DECANTER SPIKE VIAL GLASS SM (MISCELLANEOUS) IMPLANT
DEVICE DUBIN W/COMP PLATE 8390 (MISCELLANEOUS) ×1 IMPLANT
DRAIN CHANNEL 19F RND (DRAIN) IMPLANT
DRAPE LAPAROSCOPIC ABDOMINAL (DRAPES) ×2 IMPLANT
DRAPE UTILITY XL STRL (DRAPES) ×2 IMPLANT
ELECT COATED BLADE 2.86 ST (ELECTRODE) ×2 IMPLANT
ELECT REM PT RETURN 9FT ADLT (ELECTROSURGICAL) ×2
ELECTRODE REM PT RTRN 9FT ADLT (ELECTROSURGICAL) ×1 IMPLANT
EVACUATOR SILICONE 100CC (DRAIN) IMPLANT
GAUZE SPONGE 4X4 12PLY STRL (GAUZE/BANDAGES/DRESSINGS) ×2 IMPLANT
GLOVE BIO SURGEON STRL SZ 6.5 (GLOVE) ×2 IMPLANT
GLOVE BIOGEL PI IND STRL 7.0 (GLOVE) IMPLANT
GLOVE BIOGEL PI IND STRL 8.5 (GLOVE) ×1 IMPLANT
GLOVE BIOGEL PI INDICATOR 7.0 (GLOVE) ×1
GLOVE BIOGEL PI INDICATOR 8.5 (GLOVE) ×1
GLOVE ECLIPSE 8.0 STRL XLNG CF (GLOVE) ×2 IMPLANT
GOWN STRL REUS W/ TWL LRG LVL3 (GOWN DISPOSABLE) ×1 IMPLANT
GOWN STRL REUS W/TWL LRG LVL3 (GOWN DISPOSABLE) ×2
KIT MARKER MARGIN INK (KITS) ×2 IMPLANT
NDL HYPO 25X1 1.5 SAFETY (NEEDLE) ×1 IMPLANT
NDL SAFETY ECLIPSE 18X1.5 (NEEDLE) ×1 IMPLANT
NEEDLE HYPO 18GX1.5 SHARP (NEEDLE) ×2
NEEDLE HYPO 25X1 1.5 SAFETY (NEEDLE) ×2 IMPLANT
NS IRRIG 1000ML POUR BTL (IV SOLUTION) ×2 IMPLANT
PACK BASIN DAY SURGERY FS (CUSTOM PROCEDURE TRAY) ×2 IMPLANT
PENCIL BUTTON HOLSTER BLD 10FT (ELECTRODE) ×2 IMPLANT
PIN SAFETY STERILE (MISCELLANEOUS) IMPLANT
SLEEVE SCD COMPRESS KNEE MED (MISCELLANEOUS) ×2 IMPLANT
SPONGE LAP 18X18 X RAY DECT (DISPOSABLE) ×1 IMPLANT
SPONGE LAP 4X18 X RAY DECT (DISPOSABLE) ×4 IMPLANT
STAPLER VISISTAT 35W (STAPLE) IMPLANT
STRIP CLOSURE SKIN 1/2X4 (GAUZE/BANDAGES/DRESSINGS) ×2 IMPLANT
SUT ETHILON 3 0 FSL (SUTURE) IMPLANT
SUT MNCRL AB 4-0 PS2 18 (SUTURE) ×4 IMPLANT
SUT SILK 2 0 SH (SUTURE) IMPLANT
SUT VIC AB 2-0 SH 27 (SUTURE) ×2
SUT VIC AB 2-0 SH 27XBRD (SUTURE) IMPLANT
SUT VIC AB 3-0 SH 27 (SUTURE)
SUT VIC AB 3-0 SH 27X BRD (SUTURE) IMPLANT
SUT VICRYL 3-0 CR8 SH (SUTURE) ×2 IMPLANT
SYR CONTROL 10ML LL (SYRINGE) ×2 IMPLANT
TOWEL OR 17X24 6PK STRL BLUE (TOWEL DISPOSABLE) ×4 IMPLANT
TOWEL OR NON WOVEN STRL DISP B (DISPOSABLE) ×2 IMPLANT
TUBE CONNECTING 20X1/4 (TUBING) ×2 IMPLANT
YANKAUER SUCT BULB TIP NO VENT (SUCTIONS) ×2 IMPLANT

## 2015-04-14 NOTE — Anesthesia Postprocedure Evaluation (Signed)
  Anesthesia Post-op Note  Patient: Kayla [name]  Procedure(s) Performed: Procedure(s): RIGHT BREAST LUMPECTOMY WITH RADIOACTIVE SEED ANDAXILLARY SENTINEL LYMPH NODE BIOPSY (Right)  Patient Location: PACU  Anesthesia Type:GA combined with regional for post-op pain  Level of Consciousness: awake, alert , oriented and patient cooperative  Airway and Oxygen Therapy: Patient Spontanous Breathing  Post-op Pain: none  Post-op Assessment: Post-op Vital signs reviewed, Patient's Cardiovascular Status Stable, Respiratory Function Stable, Patent Airway, No signs of Nausea or vomiting and Pain level controlled              Post-op Vital Signs: Reviewed and stable  Last Vitals:  Filed Vitals:   04/14/15 0945  BP: 132/70  Pulse: 82  Temp: 36.7 C  Resp: 16    Complications: No apparent anesthesia complications

## 2015-04-14 NOTE — Transfer of Care (Signed)
Immediate Anesthesia Transfer of Care Note  Patient: Kayla [name]  Procedure(s) Performed: Procedure(s): RIGHT BREAST LUMPECTOMY WITH RADIOACTIVE SEED ANDAXILLARY SENTINEL LYMPH NODE BIOPSY (Right)  Patient Location: PACU  Anesthesia Type:GA combined with regional for post-op pain  Level of Consciousness: awake, sedated and patient cooperative  Airway & Oxygen Therapy: Patient Spontanous Breathing and Patient connected to face mask oxygen  Post-op Assessment: Report given to RN and Post -op Vital signs reviewed and stable  Post vital signs: Reviewed and stable  Last Vitals:  Filed Vitals:   04/14/15 0723  BP:   Pulse: 91  Temp:   Resp: 16    Complications: No apparent anesthesia complications

## 2015-04-14 NOTE — Discharge Instructions (Addendum)
[place] Office Phone Number [phone]  BREAST BIOPSY/ PARTIAL MASTECTOMY: POST OP INSTRUCTIONS  Always review your discharge instruction sheet given to you by the facility where your surgery was performed.  IF YOU HAVE DISABILITY OR FAMILY LEAVE FORMS, YOU MUST BRING THEM TO THE OFFICE FOR PROCESSING.  DO NOT GIVE THEM TO YOUR DOCTOR.  1. A prescription for pain medication may be given to you upon discharge.  Take your pain medication as prescribed, if needed.  If narcotic pain medicine is not needed, then you may take acetaminophen (Tylenol) or ibuprofen (Advil) as needed. 2. Take your usually prescribed medications unless otherwise directed 3. If you need a refill on your pain medication, please contact your pharmacy.  They will contact our office to request authorization.  Prescriptions will not be filled after 5pm or on week-ends. 4. You should eat very light the first 24 hours after surgery, such as soup, crackers, pudding, etc.  Resume your normal diet the day after surgery. 5. Most patients will experience some swelling and bruising in the breast.  Ice packs and a good support bra will help.  Swelling and bruising can take several days to resolve.  6. It is common to experience some constipation if taking pain medication after surgery.  Increasing fluid intake and taking a stool softener will usually help or prevent this problem from occurring.  A mild laxative (Milk of Magnesia or Miralax) should be taken according to package directions if there are no bowel movements after 48 hours. 7. Unless discharge instructions indicate otherwise, you may remove your bandages 48hours after surgery, and you may shower at that time.  You may have steri-strips (small skin tapes) in place directly over the incision.  These strips should be left on the skin.  If your surgeon used skin glue on the incision, you may shower in 24 hours.  The glue will flake off over the next 2-3 weeks.  Any  sutures or staples will be removed at the office during your follow-up visit. 8. ACTIVITIES:  You may resume light daily activities beginning the next day and resume normal activities one week after surgery if you are pain-free.  Wearing a good support bra or sports bra minimizes pain and swelling.  You may have sexual intercourse when it is comfortable. a. You may drive when you no longer are taking prescription pain medication, you can comfortably wear a seatbelt, and you can safely maneuver your car and apply brakes. b. RETURN TO WORK:  ______________________________________________________________________________________ 9. You should see your doctor in the office for a follow-up appointment approximately two weeks after your surgery.  Your doctors nurse will typically make your follow-up appointment when she calls you with your pathology report.  Expect your pathology report 3 business days after your surgery.  You may call to check if you do not hear from [place] after three days. 10. OTHER INSTRUCTIONS: _______________________________________________________________________________________________ _____________________________________________________________________________________________________________________________________ _____________________________________________________________________________________________________________________________________ _____________________________________________________________________________________________________________________________________  WHEN TO CALL YOUR DOCTOR: 1. Fever over 101.0 2. Nausea and/or vomiting. 3. Extreme swelling or bruising. 4. Continued bleeding from incision. 5. Increased pain, redness, or drainage from the incision.  The clinic staff is available to answer your questions during regular business hours.  Please dont hesitate to call and ask to speak to one of the nurses for clinical concerns.  If you have a medical  emergency, go to the nearest emergency room or call 911.  A surgeon from [facility] is always on call at the hospital.  For  further questions, please visit [email or web address]    Post Anesthesia Home Care Instructions  Activity: Get plenty of rest for the remainder of the day. A responsible adult should stay with you for 24 hours following the procedure.  For the next 24 hours, DO NOT: -Drive a car -Paediatric nurse -Drink alcoholic beverages -Take any medication unless instructed by your physician -Make any legal decisions or sign important papers.  Meals: Start with liquid foods such as gelatin or soup. Progress to regular foods as tolerated. Avoid greasy, spicy, heavy foods. If nausea and/or vomiting occur, drink only clear liquids until the nausea and/or vomiting subsides. Call your physician if vomiting continues.  Special Instructions/Symptoms: Your throat may feel dry or sore from the anesthesia or the breathing tube placed in your throat during surgery. If this causes discomfort, gargle with warm salt water. The discomfort should disappear within 24 hours.  If you had a scopolamine patch placed behind your ear for the management of post- operative nausea and/or vomiting:  1. The medication in the patch is effective for 72 hours, after which it should be removed.  Wrap patch in a tissue and discard in the trash. Wash hands thoroughly with soap and water. 2. You may remove the patch earlier than 72 hours if you experience unpleasant side effects which may include dry mouth, dizziness or visual disturbances. 3. Avoid touching the patch. Wash your hands with soap and water after contact with the patch.

## 2015-04-14 NOTE — Op Note (Signed)
Operative Note  Kayla [name] female 64 y.o. 04/14/2015  PREOPERATIVE DX:  Invasive right breast cancer  POSTOPERATIVE DX:  Same  PROCEDURE:   Right axillary lymphatic mapping. Right breast partial mastectomy after radioactive seed localization. Right axillary sentinel lymph node biopsy.         Surgeon: Odis Hollingshead   Assistants: none  Anesthesia: General LMA anesthesia  Indications:   This is a 64 year old female with metastatic colon cancer. She was undergoing follow-up imaging in the chest CT demonstrated a lesion in the upper inner quadrant of the right breast. Mammogram also demonstrated this lesion. Image guided biopsy was consistent with invasive right breast cancer. Radioactive seed localization has been performed and she presents for the above procedure.    Procedure Detail:  She was seen in the holding area in the right breast marked with my initials. Periareolar injection was performed with radioactive material. In the holding area, the seed in the upper inner quadrant was identified with the neoprobe. She subsequently was taken to the operating room and transferred to the operating table and a general anesthetic was given. The right breast and axillary areas were widely sterilely prepped and draped. A timeout was performed.  Using neoprobe identified the area of the most counts in the upper inner quadrant of the right breast. Transverse incision was made through the skin and subcutaneous tissue. Subcutaneous flaps were raised. The superior flap went to the inferior aspect of the indwelling Port-A-Cath. Using a neoprobe to identify the hottest point of the specimen I performed a partial mastectomy on the way down to the chest wall. I could palpate a small mass and had grossly negative margins. Once the specimen was removed, the margins were marked with ink. Specimen mammogram was performed and the area of concern as well as a localization clip and radioactive seed were  contained in the specimen. There is no radioactivity in the partial mastectomy bed. I placed clips at 4 quadrants of the partial mastectomy site deep in the subcutaneous tissue.  The wound was irrigated and hemostasis was adequate. I anchored the Port-A-Cath better to the chest wall using a 2-0 Vicryl suture. Then, the subcutaneous tissues were closed with interrupted 3-0 Vicryl sutures. The skin was closed with 4-0 Monocryl subcuticular stitch.  Next the right axilla was approached. Using the neoprobe an area of increased counts was noted in the inferior axilla. A transverse incision was made in the inferior right axilla through the skin and subcutaneous tissue. Using a neoprobe an area of increased counts was identified and I could palpate a lymph node. This was excised with electrocautery. This was sent to pathology as a sentinel lymph node. No other increased counts were noted but I palpated an enlarged node in the right axilla which was removed and sent separately to pathology.  The right axillary wound was inspected and hemostasis was adequate. Subcutaneous tissue was closed with interrupted 3-0 Vicryl sutures. The skin was closed with 4-0 Monocryl subcuticular stitch. Steri-Strips, sterile dressings, and a breast binder were then applied.  She tolerated the procedure well without any apparent complications and was sent to the recovery room in satisfactory condition.  Estimated Blood Loss:  300 mL                Specimens: right breast tissue. Right axillary sentinel lymph node. Separate right axillary lymph node.        Complications:  * No complications entered in OR log *  Disposition: PACU - hemodynamically stable.         Condition: stable

## 2015-04-14 NOTE — Anesthesia Procedure Notes (Addendum)
Anesthesia Regional Block:  Pectoralis block  Pre-Anesthetic Checklist: ,, timeout performed, Correct Patient, Correct Site, Correct Laterality, Correct Procedure, Correct Position, site marked, Risks and benefits discussed,  Surgical consent,  Pre-op evaluation,  At surgeon's request and post-op pain management  Laterality: Right  Prep: chloraprep       Needles:  Injection technique: Single-shot  Needle Type: Echogenic Stimulator Needle     Needle Length: 9cm 9 cm Needle Gauge: 22 and 22 G    Additional Needles:  Procedures: ultrasound guided (picture in chart) Pectoralis block Narrative:  Start time: 04/14/2015 7:12 AM End time: 04/14/2015 7:23 AM Injection made incrementally with aspirations every 5 mL.  Performed by: Personally  Anesthesiologist: Glennon Mac, [place]  Additional Notes: Pt identified in Holding room.  Monitors applied. Working IV access confirmed. Sterile prep and drape R pec.  #22ga ECHOgenic needle sub-pec minor between ribs 3,4 with US guidance.  25cc 0.5% Bupivacaine with 1:200k epi injected incrementally after negative test dose, good spread of local anesthetic.  Additional 5cc injected between pec major and pec minor with US guidance. Patient asymptomatic, VSS, no heme aspirated, tolerated well.  Jenita Seashore, MD   Procedure Name: LMA Insertion Date/Time: 04/14/2015 7:41 AM Performed by: Lyndee Leo Pre-anesthesia Checklist: Patient identified, Emergency Drugs available, Suction available and Patient being monitored Patient Re-evaluated:Patient Re-evaluated prior to inductionOxygen Delivery Method: Circle System Utilized Preoxygenation: Pre-oxygenation with 100% oxygen Intubation Type: IV induction Ventilation: Mask ventilation without difficulty LMA: LMA inserted LMA Size: 4.0 Number of attempts: 1 Airway Equipment and Method: Bite block Placement Confirmation: positive ETCO2 Tube secured with: Tape Dental Injury: Teeth and Oropharynx as per  pre-operative assessment

## 2015-04-14 NOTE — H&P (Signed)
Kayla [name]  DOB: 08-May-1951 Divorced / Language: English / Race: Black or African American Female  History of Present IllnessPatient words: new breast cancer.  The patient is a 64 year old female  Note:She was referred by Dr. Glennon Mac because of invasive right breast cancer involving the 1:30 position of the right breast. 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 On CT scan to follow up with her colon cancer, a mass in the upper-inner quadrant of the right breast was noted. Diagnostic mammogram demonstrated this mass as well. The mass is about 2 cm in size on ultrasound. Biopsy demonstrated invasive breast cancer that is ER and PR positive. She saw Dr. Burr Medico who thought she was a good candidate for lumpectomy, antiestrogen treatment, and possible radiation. She has a family history of breast cancer and her mother was in her 62s at that time. The age of her menarche was 65, first child born at age 28, late menopause at age 88.   Other Problems  Arthritis Cancer Colon Cancer High blood pressure Lump In Breast  Diagnostic Studies History Colonoscopy within last year Mammogram within last year Pap Smear 1-5 years ago  Allergies  Sulfabenzamide *CHEMICALS*  Prior to Admission medications   Medication Sig Start Date End Date Taking? Authorizing Provider  acetaminophen (TYLENOL) 500 MG tablet Take 1,000 mg by mouth every 6 (six) hours as needed.   Yes Historical Provider, MD  fluticasone (FLONASE) 50 MCG/ACT nasal spray Place 1 spray into both nostrils daily. 10/09/14  Yes Historical Provider, MD  gabapentin (NEURONTIN) 300 MG capsule TAKE ONE CAPSULE BY MOUTH AT BEDTIME 01/25/14  Yes Concha Norway, MD  ibuprofen (ADVIL,MOTRIN) 200 MG tablet Take 200 mg by mouth every 8 (eight) hours as needed. For pain   Yes Historical Provider, MD  lisinopril (PRINIVIL,ZESTRIL) 40 MG tablet Take 1 tablet (40 mg total) by mouth daily. 02/17/14   Yes Concha Norway, MD     Social History  Alcohol use Occasional alcohol use. Caffeine use Carbonated beverages. No drug use Tobacco use Former smoker.  Family History  Breast Cancer Family Members In General, Mother. Colon Cancer Family Members In General. Diabetes Mellitus Father. Heart Disease Father. Heart disease in female family member before age 5 Hypertension Family Members In General.  Pregnancy / Birth History  Age at menarche 89 years. Age of menopause 49-55 Gravida 2 Irregular periods Maternal age 67-20 Para 2  Review of Systems General Not Present- Appetite Loss, Chills, Fatigue, Fever, Night Sweats, Weight Gain and Weight Loss. Skin Not Present- Change in Wart/Mole, Dryness, Hives, Jaundice, New Lesions, Non-Healing Wounds, Rash and Ulcer. HEENT Present- Wears glasses/contact lenses. Not Present- Earache, Hearing Loss, Hoarseness, Nose Bleed, Oral Ulcers, Ringing in the Ears, Seasonal Allergies, Sinus Pain, Sore Throat, Visual Disturbances and Yellow Eyes. Breast Present- Breast Mass. Not Present- Breast Pain, Nipple Discharge and Skin Changes. Cardiovascular Not Present- Chest Pain, Difficulty Breathing Lying Down, Leg Cramps, Palpitations, Rapid Heart Rate, Shortness of Breath and Swelling of Extremities. Gastrointestinal Not Present- Abdominal Pain, Bloating, Bloody Stool, Change in Bowel Habits, Chronic diarrhea, Constipation, Difficulty Swallowing, Excessive gas, Gets full quickly at meals, Hemorrhoids, Indigestion, Nausea, Rectal Pain and Vomiting. Female Genitourinary Not Present- Frequency, Nocturia, Painful Urination, Pelvic Pain and Urgency. Musculoskeletal Present- Joint Pain. Not Present- Back Pain, Joint Stiffness, Muscle Pain, Muscle Weakness and Swelling of Extremities. Neurological Present- Numbness and Tingling. Not Present- Decreased Memory, Fainting, Headaches, Seizures, Tremor, Trouble walking and Weakness. Psychiatric  Not Present-  Anxiety, Bipolar, Change in Sleep Pattern, Depression, Fearful and Frequent crying. Endocrine Not Present- Cold Intolerance, Excessive Hunger, Hair Changes, Heat Intolerance, Hot flashes and New Diabetes. Hematology Not Present- Easy Bruising, Excessive bleeding, Gland problems, HIV and Persistent Infections.   Physical Exam  The physical exam findings are as follows: Note:General: Overweight female in NAD. Pleasant and cooperative.  HEENT: /AT, no facial masses  EYES: no icterus  NECK: Supple, no obvious mass  CV: RRR  CHEST: Breath sounds equal and clear. Respirations nonlabored.  BREASTS: Symmetrical in size. There is ecchymosis and swelling in the upper inner quadrant of the right breast. Port-A-Cath is present lateral to this area.  ABDOMEN: Soft, nontender, nondistended, multiple scars are present.  MUSCULOSKELETAL: FROM, good muscle tone, no edema, no venous stasis changes  LYMPHATIC: No palpable cervical, supraclavicular, axillary adenopathy.  NEUROLOGIC: Alert and oriented, answers questions appropriately.  PSYCHIATRIC: Normal mood, affect , and behavior.    Assessment & PlanMALIGNANT NEOPLASM OF UPPER-INNER QUADRANT OF RIGHT FEMALE BREAST (174.2  C50.211) Impression: Also has a history of metastatic colon cancer which is relative controlled at this time. We discussed breast conservation therapy which would be a right breast lumpectomy after radioactive seed localization and right axillary sentinel lymph node biopsy.  Plan: Right breast lumpectomy after radioactive seed localization as well as right axillary sentinel lymph node biopsy  I have explained the procedure, risks, and aftercare. The risks include but are not limited to bleeding, infection, wound problems, seroma formation, cosmetic deformity, anesthesia, nerve injury, lymphedema, need for reexcision or removal of more lymph nodes at a later time. She seems to understand and agrees with the plan.

## 2015-04-14 NOTE — Anesthesia Preprocedure Evaluation (Addendum)
Anesthesia Evaluation  Patient identified by MRN, date of birth, ID band Patient awake    Reviewed: Allergy & Precautions, NPO status , Patient's Chart, lab work & pertinent test results  History of Anesthesia Complications Negative for: history of anesthetic complications  Airway Mallampati: I  TM Distance: >3 FB Neck ROM: Full    Dental  (+) Dental Advisory Given   Pulmonary COPD, former smoker (quit 2010),    breath sounds clear to auscultation       Cardiovascular hypertension, Pt. on medications  Rhythm:Regular Rate:Normal  '13 ECHO: EF 60-65%, valves OK, mild-mod pericardial effusion    Neuro/Psych  Headaches,    GI/Hepatic Neg liver ROS, GERD  Controlled,  Endo/Other  Morbid obesity  Renal/GU negative Renal ROS     Musculoskeletal   Abdominal (+) + obese,   Peds  Hematology   Anesthesia Other Findings   Reproductive/Obstetrics                            Anesthesia Physical Anesthesia Plan  ASA: III  Anesthesia Plan: General   Post-op Pain Management: GA combined w/ Regional for post-op pain   Induction: Intravenous  Airway Management Planned: LMA  Additional Equipment:   Intra-op Plan:   Post-operative Plan:   Informed Consent: I have reviewed the patients History and Physical, chart, labs and discussed the procedure including the risks, benefits and alternatives for the proposed anesthesia with the patient or authorized representative who has indicated his/her understanding and acceptance.   Dental advisory given  Plan Discussed with: CRNA and Surgeon  Anesthesia Plan Comments: (Plan routine monitors, GA- LMA OK, pec block for post op analgesia)        Anesthesia Quick Evaluation

## 2015-04-14 NOTE — Progress Notes (Signed)
Assisted Dr. Glennon Mac with right, ultrasound guided, pectoralis block. Side rails up, monitors on throughout procedure. See vital signs in flow sheet. Tolerated Procedure well.

## 2015-04-15 ENCOUNTER — Encounter (HOSPITAL_BASED_OUTPATIENT_CLINIC_OR_DEPARTMENT_OTHER): Payer: Self-pay | Admitting: General Surgery

## 2015-04-15 NOTE — Addendum Note (Signed)
Addendum  created 04/15/15 1000 by Tawni Millers, CRNA   Modules edited: Charges VN

## 2015-04-17 ENCOUNTER — Encounter: Payer: Self-pay | Admitting: *Deleted

## 2015-04-17 NOTE — Progress Notes (Signed)
Received order for oncotype testing. Requisition sent to pathology.

## 2015-04-20 ENCOUNTER — Telehealth: Payer: Self-pay | Admitting: Hematology

## 2015-04-20 NOTE — Telephone Encounter (Signed)
sw,. pt and advised on 10.10 appt cx and moved to 10.18 per pof....pt ok and aware

## 2015-04-23 ENCOUNTER — Encounter (HOSPITAL_COMMUNITY): Payer: Self-pay

## 2015-04-23 DIAGNOSIS — C50211 Malignant neoplasm of upper-inner quadrant of right female breast: Secondary | ICD-10-CM | POA: Diagnosis not present

## 2015-04-27 ENCOUNTER — Ambulatory Visit: Payer: Medicare HMO | Admitting: Hematology

## 2015-05-05 ENCOUNTER — Ambulatory Visit (HOSPITAL_BASED_OUTPATIENT_CLINIC_OR_DEPARTMENT_OTHER): Payer: Medicare HMO | Admitting: Hematology

## 2015-05-05 ENCOUNTER — Ambulatory Visit (HOSPITAL_BASED_OUTPATIENT_CLINIC_OR_DEPARTMENT_OTHER): Payer: Medicare HMO

## 2015-05-05 ENCOUNTER — Encounter: Payer: Self-pay | Admitting: Hematology

## 2015-05-05 ENCOUNTER — Telehealth: Payer: Self-pay | Admitting: Hematology

## 2015-05-05 ENCOUNTER — Telehealth: Payer: Self-pay | Admitting: *Deleted

## 2015-05-05 VITALS — BP 130/72 | HR 80 | Temp 98.3°F | Resp 18 | Ht 64.0 in | Wt 188.4 lb

## 2015-05-05 DIAGNOSIS — Z170 Estrogen receptor positive status [ER+]: Secondary | ICD-10-CM

## 2015-05-05 DIAGNOSIS — C50211 Malignant neoplasm of upper-inner quadrant of right female breast: Secondary | ICD-10-CM

## 2015-05-05 DIAGNOSIS — C186 Malignant neoplasm of descending colon: Secondary | ICD-10-CM

## 2015-05-05 DIAGNOSIS — Z95828 Presence of other vascular implants and grafts: Secondary | ICD-10-CM

## 2015-05-05 DIAGNOSIS — G620 Drug-induced polyneuropathy: Secondary | ICD-10-CM | POA: Diagnosis not present

## 2015-05-05 DIAGNOSIS — Z85038 Personal history of other malignant neoplasm of large intestine: Secondary | ICD-10-CM | POA: Diagnosis not present

## 2015-05-05 DIAGNOSIS — Z452 Encounter for adjustment and management of vascular access device: Secondary | ICD-10-CM | POA: Diagnosis not present

## 2015-05-05 MED ORDER — HEPARIN SOD (PORK) LOCK FLUSH 100 UNIT/ML IV SOLN
500.0000 [IU] | Freq: Once | INTRAVENOUS | Status: AC
  Administered 2015-05-05: 500 [IU] via INTRAVENOUS
  Filled 2015-05-05: qty 5

## 2015-05-05 MED ORDER — SODIUM CHLORIDE 0.9 % IJ SOLN
10.0000 mL | INTRAMUSCULAR | Status: DC | PRN
  Administered 2015-05-05: 10 mL via INTRAVENOUS
  Filled 2015-05-05: qty 10

## 2015-05-05 MED ORDER — DEXAMETHASONE 4 MG PO TABS: 8.0000 mg | ORAL_TABLET | Freq: Two times a day (BID) | ORAL | Status: DC

## 2015-05-05 MED ORDER — ONDANSETRON HCL 8 MG PO TABS: 8.0000 mg | ORAL_TABLET | Freq: Two times a day (BID) | ORAL | Status: DC

## 2015-05-05 NOTE — Progress Notes (Signed)
Hematology and Oncology Follow Up Visit Date of Visit: 05/05/2015   Kayla [name] [id number] May 01, 1951 64 y.o. 05/05/2015 PCP: [place] [phone])  Principle Diagnosis:  1. Colon cancer, T4N1M1, Stage IV, diagnosed on 10/27/2008, brain recurrence in 03/2011  2. Stage IIA right breast cancer, diagnosed in 02/2015  Oncology History   Breast cancer of upper-inner quadrant of right female breast   Staging form: Breast, AJCC 7th Edition     Clinical: Stage IIA (T2, N0, M0) - Unsigned       Breast cancer of upper-inner quadrant of right female breast ([place])   02/27/2015 Mammogram Diagnostic mammogram Showed a 2.5 cm irregular mass within the far posterior upper inner right breast, ultrasound confirmed a 1.9 x 1.0 x 0.8 cm mass at 1:30 o'clock 15 submitted from nipple. No axillary adenopathy   03/03/2015 Initial Diagnosis Breast cancer of upper-inner quadrant of right female breast   03/03/2015 Initial Biopsy Right breast needle biopsy showed invasive ductal carcinoma, grade 1-2.   03/03/2015 Receptors her2 ER 100%+, PR 80%+, ki67 15%   04/14/2015 Surgery right breast lumpectomy and sentinel lymph node biopsy. Surgical margins were negative.   04/14/2015 Pathology Results right breast invasive ductal carcinoma, grade 2, 2.7 cm,(+) DCIS, margins were negative, 4 sentinel lymph nodes and one axillary lymph nodes were negative, (+)lymphovascular invasion.   04/14/2015 Oncotype testing RS 30, which predicts 10-year risk of distance recurrence with tamoxifen alone 20%, intermediate risk    Prior Therapy:  Kayla [name] underwent surgical resection of her tumor with a colostomy at the time of her surgery on 10/27/2008. The colostomy has been subsequently reversed on 09/20/2010. Kayla [name] received chemotherapy consisting of FOLFOX for 10 treatments from 12/10/2008 through 06/02/2009. She achieved a partial remission from these treatments. She then received additional chemotherapy with  5-FU, leucovorin, and 5-FU by continuous infusion along with Avastin from 06/23/2009 through 11/17/2009. 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   Current therapy:  Observation  Interim History:  Kayla [name] returns for follow-up. She underwent right breast lumpectomy and sentinel lymph node biopsy on    Past Medical History  Diagnosis Date  . Pericardial effusion     echo 08/13/09  . LVH (left ventricular hypertrophy)     mod/severe. echo 1/11. EF 65-70%   . Hypertension   . Thrombocytopenia ([place])   . 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 Allergy     sulfa  . Colon cancer ([place])     colon/ 2010/surg/chemo  . History of radiation therapy 04/15/11    17 Gy single fraction  l parietal brain metastais  . Heart murmur   . Peripheral vascular disease ([place])   . Shortness of breath   . Blood transfusion   . GERD (gastroesophageal reflux disease)   . Headache(784.0)   . Neuromuscular disorder ([place])     peripheral neuropathy feet  . Arthritis   . Diverticulosis 07/22/2010    sigmoid colon  . Family history of breast cancer   . Family history of colon cancer    GYN HISTORY  Menarchal: 14 LMP: 89 Contraceptive: a few years  HRT: no  G2P2:    Medications: I have reviewed the patient's current medications.  Current Outpatient Prescriptions  Medication Sig Dispense Refill  . acetaminophen (TYLENOL) 500 MG tablet Take 1,000 mg by mouth every 6 (six) hours as needed.    . fluticasone (FLONASE) 50 MCG/ACT nasal spray Place 1 spray  into both nostrils daily as needed.     . gabapentin (NEURONTIN) 300 MG capsule TAKE ONE CAPSULE BY MOUTH AT BEDTIME 90 capsule 0  . lisinopril (PRINIVIL,ZESTRIL) 40 MG tablet Take 1 tablet (40 mg total) by mouth daily. 90 tablet 0  . oxyCODONE (OXY IR/ROXICODONE) 5 MG immediate release tablet Take 1-2 tablets (5-10 mg total) by mouth every 4 (four) hours as needed for moderate pain, severe pain or breakthrough  pain. 40 tablet 0   No current facility-administered medications for this visit.   Allergies:  Allergies  Allergen Reactions  . Sulfonamide Derivatives Rash    Oncology History: Diagnosis of colon cancer dates back to 10/27/2008 when Kayla [name] presented with what turned out to be a bowel perforation through tumor. Stage at that time was T4 N1 M1 with pulmonary metastatic disease. The K-ras mutation was detected. Kayla [name] underwent surgical resection of her tumor with a colostomy at the time of her surgery on 10/27/2008. The colostomy has been subsequently reversed on 09/20/2010. Kayla [name] received chemotherapy consisting of FOLFOX for 10 treatments from 12/10/2008 through 06/02/2009. She achieved a partial remission from these treatments. She then received additional chemotherapy with 5-FU, leucovorin, and 5-FU by continuous infusion along with Avastin from 06/23/2009 through 11/17/2009. 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 She was doing well without evidence of disease until she developed a right hemiparesthesia in September 2012 and was found to have a 3 x 3 x 2.4 cm, lobulated, enhancing mass in the left parietal lobe with marked surrounding edema. A PET scan on 03/25/2011 showed resolution of the previously identified pulmonary nodules with no residual hypermetabolic activity. The pericardial effusion, which has been present since diagnosis, was unchanged. There were also uterine fibroids and a low-density lesion in the anterior spleen that was stable and not associated with any hypermetabolic activity. Of note, Kayla [name] had a markedly elevated CEA up to 24.7 on 03/23/2011. On 06/23/2011, the CEA was less than 0.5. Dr. Erline Levine resected the recurrent metastatic colon cancer on 03/31/2011. This was followed by stereotactic radiation on 04/15/2011. The patient underwent a left-sided craniotomy and excision of the mass involving her left parietal lobe on 11/10/2011 by  Dr. Erline Levine. The pathology report was negative for any malignancy. Pathology report indicated benign brain with fibrosis, hemorrhage, hemosiderin deposition, abundant dystrophic calcifications, and mixed acute and chronic inflammation including foreign body multinucleated giant cells.    Past Medical History, Surgical history, Social history, and Family History were reviewed and updated.  SMOKING HISTORY:  The patient has smoked intermittently, about 2 packs of cigarettes per week, since she was a teenager, but stopped smoking in 2010.  Review of Systems: Constitutional:  Negative for fever, chills, night sweats, anorexia, weight loss, pain. Cardiovascular: no chest pain or dyspnea on exertion Respiratory: no cough, shortness of breath, or wheezing Neurological: no TIA or stroke symptoms Dermatological: negative for rash ENT: negative for - epistaxis, headaches, tinnitus, vertigo or visual changes Skin: Negative. Gastrointestinal: no abdominal pain, change in bowel habits, or black or bloody stools positive for - heartburn Genito-Urinary: no dysuria, trouble voiding, or hematuria Hematological and Lymphatic: negative for - bleeding problems, fatigue, jaundice or pallor Breast: negative for breast lumps Musculoskeletal: positive for - muscular weakness and pins and needles with some numbness (R>L) in lower extremities negative for - gait disturbance Remaining ROS negative.  Physical Exam: Blood pressure 130/72, pulse 80, temperature 98.3 F (36.8 C), temperature source Oral, resp. rate 18, height _0  (  1.626 m), weight 188 lb 6.4 oz (85.458 kg), SpO2 99 %. ECOG: 0 General appearance: alert, cooperative, appears stated age, no distress and mildly obese Head: Normocephalic, without obvious abnormality, atraumatic Neck: no adenopathy, supple, symmetrical, trachea midline and thyroid not enlarged, symmetric, no tenderness/mass/nodules HEENT: PERRLA; EOMi; No sclerae icterus. OP clear  of masses.  Lymph nodes: Cervical, supraclavicular, and axillary nodes normal. Heart:regular rate and rhythm, S1, S2 normal, no murmur, click, rub or gallop Lung:chest clear, no wheezing, rales, normal symmetric air entry,  + R sided port-a-cath. Abdomin: soft, non-tender, without masses or organomegaly, normal bowel sounds and surgical scars well healed. EXT:No peripheral edema.  R>L decreased sensation and mildly decreased strength (noted in prior exams) Neuro: R lower extremity weakness and decreased sensation.  Normal gait.  Good finger to nose bilaterally. Otherwise no focal deficits. Breasts: Breast inspection showed them to be symmetrical with no nipple discharge. The surgical incision in right breast and axilla are well-healed, mild tenderness. Palpation of the breasts and axilla revealed no obvious mass that I could appreciate.    Lab Results: CBC Latest Ref Rng 04/14/2015 02/02/2015 11/03/2014  WBC 3.9 - 10.3 10e3/uL - 9.5 7.8  Hemoglobin 12.0 - 15.0 g/dL 12.7 13.9 14.1  Hematocrit 34.8 - 46.6 % - 42.6 44.1  Platelets 145 - 400 10e3/uL - 198 212    CMP Latest Ref Rng 04/09/2015 03/12/2015 02/02/2015  Glucose 65 - 99 mg/dL 96 - 89  BUN 6 - 20 mg/dL 12 15.1 12.1  Creatinine 0.44 - 1.00 mg/dL 0.87 0.9 0.8  Sodium 135 - 145 mmol/L 142 - 141  Potassium 3.5 - 5.1 mmol/L 4.2 - 4.4  Chloride 101 - 111 mmol/L 105 - -  CO2 22 - 32 mmol/L 29 - 25  Calcium 8.9 - 10.3 mg/dL 9.6 - 9.6  Total Protein 6.4 - 8.3 g/dL - - 7.2  Total Bilirubin 0.20 - 1.20 mg/dL - - 0.93  Alkaline Phos 40 - 150 U/L - - 118  AST 5 - 34 U/L - - 22  ALT 0 - 55 U/L - - 30   CEA  Status: Finalresult Visible to patient:  Not Released Nextappt: 08/11/2014 at 09:40 AM in Radiology ([facility]-MM 1) Dx:  Metastatic cancer to brain           Ref Range 15mo ago  58mo ago  19mo ago  10yr ago     CEA 0.0 - 5.0 ng/mL 0.9 1.3 1.4 1.1       PATHOLOGY REPORT Diagnosis 04/14/2015 1. Breast, lumpectomy,  right - INVASIVE GRADE II DUCTAL CARCINOMA, SPANNING 2.7 CM IN GREATEST DIMENSION. - ASSOCIATED INTERMEDIATE GRADE DUCTAL CARCINOMA IN SITU. - LYMPH/VASCULAR INVASION IS IDENTIFIED. - MARGINS ARE NEGATIVE. - SEE ONCOLOGY TEMPLATE. 2. Lymph node, sentinel, biopsy, right axillary - ONE BENIGN LYMPH NODE WITH NO TUMOR SEEN (0/1). 3. Lymph node, sentinel, biopsy, right axillary - ONE BENIGN LYMPH NODE WITH NO TUMOR SEEN (0/1). 4. Lymph node, sentinel, biopsy, right axillary - ONE BENIGN LYMPH NODE WITH NO TUMOR SEEN (0/1). 5. Lymph node, biopsy, right axillary - ONE BENIGN LYMPH NODE WITH NO TUMOR SEEN (0/1). Microscopic Comment 1. BREAST, INVASIVE TUMOR, WITH LYMPH NODES PRESENT Specimen, including laterality and lymph node sampling (sentinel, non-sentinel): Right partial breast with right sentinel lymph node sampling. Procedure: Right breast lumpectomy with right sentinel lymph node biopsies. Histologic type: Invasive ductal carcinoma. Grade: 2. Tubule formation: 3. Nuclear pleomorphism: 2. Mitotic: 1. Tumor size (gross measurement): 2.7 cm. Margins: Invasive, distance  to closest margin: 0.2 cm (anterior margin). In-situ, distance to closest margin: At least 0.4 cm (all margins). Lymphovascular invasion: Yes, lymph/vascular invasion is identified. Ductal carcinoma in situ: Yes. Grade: Intermediate grade. Extensive intraductal component: No. Lobular neoplasia: Not identified. Tumor focality: Unifocal. Treatment effect: Not applicable. Extent of tumor: Tumor confined to breast parenchyma. Skin: Not received. Nipple: Not received. Skeletal muscle: Not received. Lymph nodes Examined: 3 Sentinel. 1 Non-sentinel. 4 Total. Lymph nodes with metastasis: 0. Isolated tumor cells (< 0.2 mm): 0. Micrometastasis: (> 0.2 mm and < 2.0 mm): 0. Macrometastasis: (> 2.0 mm): 0. Extracapsular extension: Not applicable. Breast prognostic profile: Performed on previous case  [phone]: Estrogen receptor: 100%, positive. Progesterone receptor: 80%, positive. Her-2 neu: 1.30 ratio, negative. Ki-67: 15%. Non-neoplastic breast: Unremarkable. TNM: pT2, pN0. Comments: As Her-2 neu was previously negative, this will be repeated on representative tumor from the current specimen, and will be reported in an addendum to follow. (RH:ds 04/15/15)  Results: HER2 - NEGATIVE RATIO OF HER2/CEP17 SIGNALS 1.17 AVERAGE HER2 COPY NUMBER PER CELL 2.05   Radiological Studies: 1. MRI of the head with and without IV contrast on 03/23/2011 showed a solitary enhancing mass in the left parietal lobe with surrounding white matter edema, most compatible with solitary metastatic deposit. The mass lesion measured 2.7 x 2.5 cm.  2. PET scan from 03/25/2011 showed resolution of the previously- identified pulmonary nodules with no residual hypermetabolic activity noted in the neck, chest, abdomen or pelvis to suggest active malignancy. There was a pericardial effusion and some subsegmental atelectasis in the lower lobes. There were also uterine fibroids and a low-density lesion in the anterior spleen that was stable and not associated with hypermetabolic activity.  3. MRI of the head with and without IV contrast showed Stealth protocol utilized to evaluate left parietal mass. The mass measured 3 x 3 x 2.4 cm with marked surrounding vasogenic edema.  4. MRI of the head with and without IV contrast on 04/08/2011 showed postsurgical changes of tumor removal in the left parietal lobe and a postsurgical hematoma with enhancement. There was 3 mm of midline shift. No other metastatic deposits.  5. MRI of the head with and without IV contrast on 06/28/2011 showed a 3 cm area of restricted diffusion lying within the previous area of  left parietal surgical resection for colon cancer. There was enhancement of the peripheral aspect of the cavity, marked restricted diffusion and increasing vasogenic edema.  There was concern about the development of a brain abscess.  6. MRI of the head with and without IV contrast on 07/29/2011 showed left parietal postoperative hematoma was smaller compared with the  prior study. There was no nodular enhancement seen to indicate tumor. No new enhancing lesions were seen. There was significant  improvement in the left parietal edema.  7. Digital screening mammogram was negative on 08/02/2011.  8. An MRI of the head with and without IV contrast on 10/21/2011 showed development of gyriform enhancement circumferentially along the margins of the left parietal postoperative space/hematoma. Marked increase in vasogenic edema was seen. 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 There was further contraction of the hematoma/postoperative space in the left parietal cortical region, now measuring 17 x 19 mm in transverse diameter as opposed to 22 x 21 mm previously.  Left-to-right shift was 2 mm.  9. Nuclear medicine brain PET-CT scan carried out on 10/27/2011 showed hypermetabolic activity in the high left parietal lobe which  corresponds to gyriform enhancement on comparison MRI from 10/21/2011. This was felt to be most consistent with recurrent  tumor.  10. Chest x-ray, 2 view from 11/04/2011, showed chronic cardiomegaly. Port-A-Cath was in place. No acute disease was present.  11. MRI of the head with and without IV contrast on 01/27/2012 showed findings that were felt to be consistent with progression of disease. This exam was compared with the MRI of 10/21/2011. It was recognized that the patient underwent surgical resection of the mass on 11/10/2011.  There was felt to be on the present exam residual peripheral enhancement of approximately 35 x 40 x 21 mm with infiltration of the cortex and deep white matter. It was felt that the enhancement had progressed, despite removal of the central focus. There was moderate vasogenic   edema throughout the left hemisphere but slightly decreased from the MRI from 10/21/2011. No new lesions were seen. There was mild atrophy with chronic microvascular ischemic change, but no midline shift. Major intracranial vascular structures were patent.  12. MRI of the brain with and without IV contrast on 05/04/2012 showed previous left parietal lobe surgery for resection of tumor and radiation necrosis.  No new abnormalities were noted. 13. Chest x-ray, 2 view, on 07/03/2012 showed enlargement of the cardiac silhouette with some lingular scarring, as compared with the chest x-ray from 11/04/2011. 14. 2-D echocardiogram from 07/12/2012 showed left ventricular ejection fraction of 55-60%.  There was a small to moderate circumferential pericardial effusion with no signs of tamponade.  There was felt to be no significant change from the 2-D echocardiogram dated 08/13/2009. 15. Digital bilateral screening mammogram on 08/06/2012 was negative. 16. 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  There was no progression or new brain metastasis identified. 17. PET scan from 10/16/2012 showed no specific features to suggest metastatic disease.  There was diffuse uptake throughout the thyroid gland, possibly due to hyperthyroidism. 18. MRI of the brain with and without IV contrast on 03/12/2013 showed residual gyriform enhancement surrounding the area of previous surgical resection of a left parietal metastasis with subsequent resection of radionecrosis. The findings likely represent some residual brain injury, but there is no significant progression of radionecrosis and no new lesions seen. 19. MRI of the brain with and without IV contrast on 11/01/2013 showed Stable posttherapy appearance of the brain since May of 2014. No progression or new brain metastasis  identified. 20. CT chest,abomen and pelvis with contrast. 1. No evidence of thoracic metastasis.  2. Small prevascular lymph node is similar prior. 3. Stable pericardial effusion. 4. Interval increase in the volume presacral lymph node in the upper pelvis. This has progressed slowly from 2011. Recommend attention on follow-up versus restaging FDG PET scan. 5. Stable anastomosis in the descending colon. 21. CT chest, abdomen and pelvis with contrast 07/126/2015. 1. No evidence of local colon cancer recurrence or metastasis within the abdomen or pelvis. 2. Retention of stool at the colocolonic anastomosis in the left colon is unchanged comparison exam. No obstructing lesion identified.  22. Mesenteric lymph node is again demonstrated with no increased size. 4. Stable splenic lesion likely representing a complex cysts or hemangioma. 5. Stable small pericardial effusion 23. Brain MRI 11/11/2014: stable and satisfactory posttherapy appearance of the brain since 2014 and 2015. No new brain metastasis identified. 24. CT chest, abdomen and pelvis w contrast 02/02/2015: 1. No new  or progressive findings to suggest recurrent colorectal, cancer in the chest, abdomen, or pelvis. 2. 16 mm soft tissue nodule in the medial right breast. Correlation with mammographic history recommended. 3. Fibroid change in the uterus. 4. Stable hypodense lesion in the anterior spleen, compatible with complex cyst or hemangioma. 5. Stable appearance of the trace pericardial effusion.   Impression and Plan: 64 year old African-American female, postmenopausal  1. Right breat invasive ductal carcinoma, pT2N0M0, stage IIA, ER+/PR+, HER2-, Oncotype RS 30 -I reviewed her surgical pathology findings with her in great details. -I reviewed her Oncotype DX test results. The recurrence score is 30, which predicts 10 year risk of distant recurrence 20% with tamoxifen alone. This is considered intermediate risk, but close to high risk group. -.  Her recurrence score is very close to high risk group, and she is in good health overall, I recommend her to have adjuvant chemotherapy, with docetaxel and Cytoxan, intravenously every 3 weeks, for total of 4 cycles.  --Chemotherapy consent: Side effects including but does not not limited to, fatigue, nausea, vomiting, diarrhea, hair loss (including a small risk of permanent alopecia), neuropathy, fluid retention, renal and kidney dysfunction, neutropenic fever, needed for blood transfusion, bleeding, were discussed with patient in great detail. She agrees to proceed. -The goal of treatment is curative. -Given this strong ER/PR positivity, I recommend adjuvant endocrine therapy with aromatase inhibitor, after she completes breast radiation. -She would benefit from adjuvant breast radiation also. This will be followed up by her radiation oncologist Dr. Tammi Klippel -She has a restaging brain MRI scheduled next week, and a follow-up with Dr. Tammi Klippel after the scan. Of course if the brain MRI shows disease recurrence, we may postpone or change her breast cancer treatment.  2. Stage IV Colon Cancer mets to lung and brain, NED --Kayla [name] continues to do well with no evidence for disease recurrence.  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   -She is clinically doing very well. Her CEA level has been normal.  - her last brain MRI in August 2016  Was negative for recurrenc -her recent restaging CT scans in 01/2015 showed no evidence of recurrence - she is clinically doing well, physical exam is unremarkable, we'll continue observation. -follow up with lab CBC, CMP and CEA every 3 months.  3. Port -She will continue fresh her port every 6 weeks  4. Peripheral neuropathy, grade 1, secondary to chemotherapy  -stable  5. Genetics -Given her positive family and personal history of breast (Mother at age of 46 and cousin) and  colon cancer (cousin), she was referred to genetic counselor -Her genetic test was negative  She will continue follow-up with her primary care physician for other medical problems  Plan -I'll tentatively schedule her first hemotherapy docetaxel and Cytoxan for November 4. She will take dexa the day before chemo -I will see her on 11/4  ll questions were answered. The patient knows to call the clinic with any problems, questions or concerns. We can certainly see the patient much sooner if necessary. I plan to see her back in 2 weeks after her breast surgery.  I spent 30 minutes counseling the patient face to face. The total time spent in the appointment was 40 minutes.   Truitt Merle  05/05/2015

## 2015-05-05 NOTE — Telephone Encounter (Signed)
per pof to sch pt appt-gave pt copy of sch-sent MW email to sch trmt-pt aware

## 2015-05-05 NOTE — Telephone Encounter (Signed)
Per staff message and POF I have scheduled appts. Advised scheduler of appts. JMW  

## 2015-05-05 NOTE — Patient Instructions (Signed)

## 2015-05-21 ENCOUNTER — Other Ambulatory Visit (HOSPITAL_BASED_OUTPATIENT_CLINIC_OR_DEPARTMENT_OTHER): Payer: Medicare HMO

## 2015-05-21 ENCOUNTER — Ambulatory Visit (HOSPITAL_BASED_OUTPATIENT_CLINIC_OR_DEPARTMENT_OTHER): Payer: Medicare HMO | Admitting: Hematology

## 2015-05-21 ENCOUNTER — Inpatient Hospital Stay: Payer: Medicare HMO

## 2015-05-21 ENCOUNTER — Ambulatory Visit: Payer: Medicare HMO

## 2015-05-21 ENCOUNTER — Ambulatory Visit (HOSPITAL_BASED_OUTPATIENT_CLINIC_OR_DEPARTMENT_OTHER): Payer: Medicare HMO

## 2015-05-21 ENCOUNTER — Telehealth: Payer: Self-pay | Admitting: Hematology

## 2015-05-21 ENCOUNTER — Encounter: Payer: Self-pay | Admitting: Hematology

## 2015-05-21 ENCOUNTER — Encounter: Payer: Self-pay | Admitting: *Deleted

## 2015-05-21 VITALS — BP 138/68 | HR 94 | Temp 97.9°F | Resp 20 | Ht 64.0 in | Wt 186.9 lb

## 2015-05-21 VITALS — BP 128/73 | HR 92 | Temp 97.3°F | Resp 20

## 2015-05-21 DIAGNOSIS — Z5111 Encounter for antineoplastic chemotherapy: Secondary | ICD-10-CM | POA: Diagnosis not present

## 2015-05-21 DIAGNOSIS — C50211 Malignant neoplasm of upper-inner quadrant of right female breast: Secondary | ICD-10-CM

## 2015-05-21 DIAGNOSIS — Z95828 Presence of other vascular implants and grafts: Secondary | ICD-10-CM

## 2015-05-21 DIAGNOSIS — G620 Drug-induced polyneuropathy: Secondary | ICD-10-CM

## 2015-05-21 DIAGNOSIS — C186 Malignant neoplasm of descending colon: Secondary | ICD-10-CM

## 2015-05-21 DIAGNOSIS — Z85038 Personal history of other malignant neoplasm of large intestine: Secondary | ICD-10-CM

## 2015-05-21 DIAGNOSIS — Z170 Estrogen receptor positive status [ER+]: Secondary | ICD-10-CM | POA: Diagnosis not present

## 2015-05-21 DIAGNOSIS — C7931 Secondary malignant neoplasm of brain: Secondary | ICD-10-CM

## 2015-05-21 DIAGNOSIS — C7800 Secondary malignant neoplasm of unspecified lung: Secondary | ICD-10-CM | POA: Diagnosis not present

## 2015-05-21 LAB — COMPREHENSIVE METABOLIC PANEL (CC13)
ALT: 39 U/L (ref 0–55)
ANION GAP: 11 meq/L (ref 3–11)
AST: 37 U/L — AB (ref 5–34)
Albumin: 3.9 g/dL (ref 3.5–5.0)
Alkaline Phosphatase: 120 U/L (ref 40–150)
BUN: 17.5 mg/dL (ref 7.0–26.0)
CHLORIDE: 108 meq/L (ref 98–109)
CO2: 20 meq/L — AB (ref 22–29)
CREATININE: 0.8 mg/dL (ref 0.6–1.1)
Calcium: 10.2 mg/dL (ref 8.4–10.4)
EGFR: 86 mL/min/{1.73_m2} — ABNORMAL LOW (ref >90)
Glucose: 167 mg/dl — ABNORMAL HIGH (ref 70–140)
Potassium: 4.1 mEq/L (ref 3.5–5.1)
Sodium: 139 mEq/L (ref 136–145)
Total Bilirubin: 0.58 mg/dL (ref 0.20–1.20)
Total Protein: 7.7 g/dL (ref 6.4–8.3)

## 2015-05-21 LAB — CBC WITH DIFFERENTIAL/PLATELET
BASO%: 0.1 % (ref 0.0–2.0)
Basophils Absolute: 0 10*3/uL (ref 0.0–0.1)
EOS%: 0 % (ref 0.0–7.0)
Eosinophils Absolute: 0 10*3/uL (ref 0.0–0.5)
HCT: 43 % (ref 34.8–46.6)
HGB: 14.2 g/dL (ref 11.6–15.9)
LYMPH%: 8.2 % — AB (ref 14.0–49.7)
MCH: 28.2 pg (ref 25.1–34.0)
MCHC: 33 g/dL (ref 31.5–36.0)
MCV: 85.5 fL (ref 79.5–101.0)
MONO#: 0.5 10*3/uL (ref 0.1–0.9)
MONO%: 2.6 % (ref 0.0–14.0)
NEUT#: 17.2 10*3/uL — ABNORMAL HIGH (ref 1.5–6.5)
NEUT%: 89.1 % — AB (ref 38.4–76.8)
Platelets: 243 10*3/uL (ref 145–400)
RBC: 5.03 10*6/uL (ref 3.70–5.45)
RDW: 14.7 % — ABNORMAL HIGH (ref 11.2–14.5)
WBC: 19.3 10*3/uL — ABNORMAL HIGH (ref 3.9–10.3)
lymph#: 1.6 10*3/uL (ref 0.9–3.3)

## 2015-05-21 LAB — CEA: CEA: 0.9 ng/mL (ref 0.0–5.0)

## 2015-05-21 MED ORDER — DEXAMETHASONE SODIUM PHOSPHATE 100 MG/10ML IJ SOLN
Freq: Once | INTRAMUSCULAR | Status: AC
  Administered 2015-05-21: 11:00:00 via INTRAVENOUS
  Filled 2015-05-21: qty 8

## 2015-05-21 MED ORDER — HEPARIN SOD (PORK) LOCK FLUSH 100 UNIT/ML IV SOLN
500.0000 [IU] | Freq: Once | INTRAVENOUS | Status: AC | PRN
  Administered 2015-05-21: 500 [IU]
  Filled 2015-05-21: qty 5

## 2015-05-21 MED ORDER — DOCETAXEL CHEMO INJECTION 160 MG/16ML
75.0000 mg/m2 | Freq: Once | INTRAVENOUS | Status: AC
  Administered 2015-05-21: 150 mg via INTRAVENOUS
  Filled 2015-05-21: qty 15

## 2015-05-21 MED ORDER — SODIUM CHLORIDE 0.9 % IV SOLN
600.0000 mg/m2 | Freq: Once | INTRAVENOUS | Status: AC
  Administered 2015-05-21: 1180 mg via INTRAVENOUS
  Filled 2015-05-21: qty 59

## 2015-05-21 MED ORDER — SODIUM CHLORIDE 0.9 % IV SOLN
Freq: Once | INTRAVENOUS | Status: AC
  Administered 2015-05-21: 11:00:00 via INTRAVENOUS

## 2015-05-21 MED ORDER — SODIUM CHLORIDE 0.9 % IJ SOLN
10.0000 mL | INTRAMUSCULAR | Status: DC | PRN
  Administered 2015-05-21: 10 mL via INTRAVENOUS
  Filled 2015-05-21: qty 10

## 2015-05-21 MED ORDER — SODIUM CHLORIDE 0.9 % IJ SOLN
10.0000 mL | INTRAMUSCULAR | Status: DC | PRN
  Administered 2015-05-21: 10 mL
  Filled 2015-05-21: qty 10

## 2015-05-21 NOTE — Patient Instructions (Signed)

## 2015-05-21 NOTE — Patient Instructions (Signed)
[facility] Discharge Instructions for Patients Receiving Chemotherapy  Today you received the following chemotherapy agents taxotere, cytoxan  To help prevent nausea and vomiting after your treatment, we encourage you to take your nausea medication   If you develop nausea and vomiting that is not controlled by your nausea medication, call the clinic.   BELOW ARE SYMPTOMS THAT SHOULD BE REPORTED IMMEDIATELY:  *FEVER GREATER THAN 100.5 F  *CHILLS WITH OR WITHOUT FEVER  NAUSEA AND VOMITING THAT IS NOT CONTROLLED WITH YOUR NAUSEA MEDICATION  *UNUSUAL SHORTNESS OF BREATH  *UNUSUAL BRUISING OR BLEEDING  TENDERNESS IN MOUTH AND THROAT WITH OR WITHOUT PRESENCE OF ULCERS  *URINARY PROBLEMS  *BOWEL PROBLEMS  UNUSUAL RASH Items with * indicate a potential emergency and should be followed up as soon as possible.  Feel free to call the clinic you have any questions or concerns. The clinic phone number is [phone].  Please show the [place] at check-in to the Emergency Department and triage nurse.  Docetaxel injection What is this medicine? DOCETAXEL (doe se TAX el) is a chemotherapy drug. It targets fast dividing cells, like cancer cells, and causes these cells to die. This medicine is used to treat many types of cancers like breast cancer, certain stomach cancers, head and neck cancer, lung cancer, and prostate cancer. This medicine may be used for other purposes; ask your health care provider or pharmacist if you have questions. What should I tell my health care provider before I take this medicine? They need to know if you have any of these conditions: -infection (especially a virus infection such as chickenpox, cold sores, or herpes) -liver disease -low blood counts, like low white cell, platelet, or red cell counts -an unusual or allergic reaction to docetaxel, polysorbate 80, other chemotherapy agents, other medicines, foods, dyes, or  preservatives -pregnant or trying to get pregnant -breast-feeding How should I use this medicine? This drug is given as an infusion into a vein. It is administered in a hospital or clinic by a specially trained health care professional. Talk to your pediatrician regarding the use of this medicine in children. Special care may be needed. Overdosage: If you think you have taken too much of this medicine contact a poison control center or emergency room at once. NOTE: This medicine is only for you. Do not share this medicine with others. What if I miss a dose? It is important not to miss your dose. Call your doctor or health care professional if you are unable to keep an appointment. What may interact with this medicine? -cyclosporine -erythromycin -ketoconazole -medicines to increase blood counts like filgrastim, pegfilgrastim, sargramostim -vaccines Talk to your doctor or health care professional before taking any of these medicines: -acetaminophen -aspirin -ibuprofen -ketoprofen -naproxen This list may not describe all possible interactions. Give your health care provider a list of all the medicines, herbs, non-prescription drugs, or dietary supplements you use. Also tell them if you smoke, drink alcohol, or use illegal drugs. Some items may interact with your medicine. What should I watch for while using this medicine? Your condition will be monitored carefully while you are receiving this medicine. You will need important blood work done while you are taking this medicine. This drug may make you feel generally unwell. This is not uncommon, as chemotherapy can affect healthy cells as well as cancer cells. Report any side effects. Continue your course of treatment even though you feel ill unless your doctor tells you to stop. In  some cases, you may be given additional medicines to help with side effects. Follow all directions for their use. Call your doctor or health care professional for  advice if you get a fever, chills or sore throat, or other symptoms of a cold or flu. Do not treat yourself. This drug decreases your body's ability to fight infections. Try to avoid being around people who are sick. This medicine may increase your risk to bruise or bleed. Call your doctor or health care professional if you notice any unusual bleeding. This medicine may contain alcohol in the product. You may get drowsy or dizzy. Do not drive, use machinery, or do anything that needs mental alertness until you know how this medicine affects you. Do not stand or sit up quickly, especially if you are an older patient. This reduces the risk of dizzy or fainting spells. Avoid alcoholic drinks. Do not become pregnant while taking this medicine. Women should inform their doctor if they wish to become pregnant or think they might be pregnant. There is a potential for serious side effects to an unborn child. Talk to your health care professional or pharmacist for more information. Do not breast-feed an infant while taking this medicine. What side effects may I notice from receiving this medicine? Side effects that you should report to your doctor or health care professional as soon as possible: -allergic reactions like skin rash, itching or hives, swelling of the face, lips, or tongue -low blood counts - This drug may decrease the number of white blood cells, red blood cells and platelets. You may be at increased risk for infections and bleeding. -signs of infection - fever or chills, cough, sore throat, pain or difficulty passing urine -signs of decreased platelets or bleeding - bruising, pinpoint red spots on the skin, black, tarry stools, nosebleeds -signs of decreased red blood cells - unusually weak or tired, fainting spells, lightheadedness -breathing problems -fast or irregular heartbeat -low blood pressure -mouth sores -nausea and vomiting -pain, swelling, redness or irritation at the injection  site -pain, tingling, numbness in the hands or feet -swelling of the ankle, feet, hands -weight gain Side effects that usually do not require medical attention (report to your prescriber or health care professional if they continue or are bothersome): -bone pain -complete hair loss including hair on your head, underarms, pubic hair, eyebrows, and eyelashes -diarrhea -excessive tearing -changes in the color of fingernails -loosening of the fingernails -nausea -muscle pain -red flush to skin -sweating -weak or tired This list may not describe all possible side effects. Call your doctor for medical advice about side effects. You may report side effects to FDA at 1-800-FDA-1088. Where should I keep my medicine? This drug is given in a hospital or clinic and will not be stored at home. NOTE: This sheet is a summary. It may not cover all possible information. If you have questions about this medicine, talk to your doctor, pharmacist, or health care provider.    2016, [employer]. (2014-07-21 16:04:57)  Cyclophosphamide injection What is this medicine? CYCLOPHOSPHAMIDE (sye kloe FOSS fa mide) is a chemotherapy drug. It slows the growth of cancer cells. This medicine is used to treat many types of cancer like lymphoma, myeloma, leukemia, breast cancer, and ovarian cancer, to name a few. This medicine may be used for other purposes; ask your health care provider or pharmacist if you have questions. What should I tell my health care provider before I take this medicine? They need to know if you  have any of these conditions: -blood disorders -history of other chemotherapy -infection -kidney disease -liver disease -recent or ongoing radiation therapy -tumors in the bone marrow -an unusual or allergic reaction to cyclophosphamide, other chemotherapy, other medicines, foods, dyes, or preservatives -pregnant or trying to get pregnant -breast-feeding How should I use this  medicine? This drug is usually given as an injection into a vein or muscle or by infusion into a vein. It is administered in a hospital or clinic by a specially trained health care professional. Talk to your pediatrician regarding the use of this medicine in children. Special care may be needed. Overdosage: If you think you have taken too much of this medicine contact a poison control center or emergency room at once. NOTE: This medicine is only for you. Do not share this medicine with others. What if I miss a dose? It is important not to miss your dose. Call your doctor or health care professional if you are unable to keep an appointment. What may interact with this medicine? This medicine may interact with the following medications: -amiodarone -amphotericin B -azathioprine -certain antiviral medicines for HIV or AIDS such as protease inhibitors (e.g., indinavir, ritonavir) and zidovudine -certain blood pressure medications such as benazepril, captopril, enalapril, fosinopril, lisinopril, moexipril, monopril, perindopril, quinapril, ramipril, trandolapril -certain cancer medications such as anthracyclines (e.g., daunorubicin, doxorubicin), busulfan, cytarabine, paclitaxel, pentostatin, tamoxifen, trastuzumab -certain diuretics such as chlorothiazide, chlorthalidone, hydrochlorothiazide, indapamide, metolazone -certain medicines that treat or prevent blood clots like warfarin -certain muscle relaxants such as succinylcholine -cyclosporine -etanercept -indomethacin -medicines to increase blood counts like filgrastim, pegfilgrastim, sargramostim -medicines used as general anesthesia -metronidazole -natalizumab This list may not describe all possible interactions. Give your health care provider a list of all the medicines, herbs, non-prescription drugs, or dietary supplements you use. Also tell them if you smoke, drink alcohol, or use illegal drugs. Some items may interact with your  medicine. What should I watch for while using this medicine? Visit your doctor for checks on your progress. This drug may make you feel generally unwell. This is not uncommon, as chemotherapy can affect healthy cells as well as cancer cells. Report any side effects. Continue your course of treatment even though you feel ill unless your doctor tells you to stop. Drink water or other fluids as directed. Urinate often, even at night. In some cases, you may be given additional medicines to help with side effects. Follow all directions for their use. Call your doctor or health care professional for advice if you get a fever, chills or sore throat, or other symptoms of a cold or flu. Do not treat yourself. This drug decreases your body's ability to fight infections. Try to avoid being around people who are sick. This medicine may increase your risk to bruise or bleed. Call your doctor or health care professional if you notice any unusual bleeding. Be careful brushing and flossing your teeth or using a toothpick because you may get an infection or bleed more easily. If you have any dental work done, tell your dentist you are receiving this medicine. You may get drowsy or dizzy. Do not drive, use machinery, or do anything that needs mental alertness until you know how this medicine affects you. Do not become pregnant while taking this medicine or for 1 year after stopping it. Women should inform their doctor if they wish to become pregnant or think they might be pregnant. Men should not father a child while taking this medicine and for 4 months  after stopping it. There is a potential for serious side effects to an unborn child. Talk to your health care professional or pharmacist for more information. Do not breast-feed an infant while taking this medicine. This medicine may interfere with the ability to have a child. This medicine has caused ovarian failure in some women. This medicine has caused reduced sperm  counts in some men. You should talk with your doctor or health care professional if you are concerned about your fertility. If you are going to have surgery, tell your doctor or health care professional that you have taken this medicine. What side effects may I notice from receiving this medicine? Side effects that you should report to your doctor or health care professional as soon as possible: -allergic reactions like skin rash, itching or hives, swelling of the face, lips, or tongue -low blood counts - this medicine may decrease the number of white blood cells, red blood cells and platelets. You may be at increased risk for infections and bleeding. -signs of infection - fever or chills, cough, sore throat, pain or difficulty passing urine -signs of decreased platelets or bleeding - bruising, pinpoint red spots on the skin, black, tarry stools, blood in the urine -signs of decreased red blood cells - unusually weak or tired, fainting spells, lightheadedness -breathing problems -dark urine -dizziness -palpitations -swelling of the ankles, feet, hands -trouble passing urine or change in the amount of urine -weight gain -yellowing of the eyes or skin Side effects that usually do not require medical attention (report to your doctor or health care professional if they continue or are bothersome): -changes in nail or skin color -hair loss -missed menstrual periods -mouth sores -nausea, vomiting This list may not describe all possible side effects. Call your doctor for medical advice about side effects. You may report side effects to FDA at 1-800-FDA-1088. Where should I keep my medicine? This drug is given in a hospital or clinic and will not be stored at home. NOTE: This sheet is a summary. It may not cover all possible information. If you have questions about this medicine, talk to your doctor, pharmacist, or health care provider.    2016, [employer]. (2012-05-18 16:22:58)

## 2015-05-21 NOTE — Telephone Encounter (Signed)
per pof to sch pt appt-gave pt copy of avs °

## 2015-05-21 NOTE — Progress Notes (Signed)
Hematology and Oncology Follow Up Visit Date of Visit: 05/21/2015   Kayla [name] [id number] 06/01/1951 64 y.o. 05/21/2015 PCP: [place] [phone])  Principle Diagnosis:  1. Colon cancer, T4N1M1, Stage IV, diagnosed on 10/27/2008, brain recurrence in 03/2011  2. Stage IIA right breast cancer, diagnosed in 02/2015  Oncology History   Breast cancer of upper-inner quadrant of right female breast [facility])   Staging form: Breast, AJCC 7th Edition     Clinical: Stage IIA (T2, N0, M0) - Unsigned     Pathologic stage from 04/14/2015: Stage IIA (T2, N0, cM0) - Signed by Truitt Merle, MD on 05/05/2015 Cancer of left colon [facility])   Staging form: Colon and Rectum, AJCC 7th Edition     Pathologic: T4a, N1a, M1 - Unsigned        Breast cancer of upper-inner quadrant of right female breast ([place])   02/27/2015 Mammogram Diagnostic mammogram Showed a 2.5 cm irregular mass within the far posterior upper inner right breast, ultrasound confirmed a 1.9 x 1.0 x 0.8 cm mass at 1:30 o'clock 15 submitted from nipple. No axillary adenopathy   03/03/2015 Initial Diagnosis Breast cancer of upper-inner quadrant of right female breast   03/03/2015 Initial Biopsy Right breast needle biopsy showed invasive ductal carcinoma, grade 1-2.   03/03/2015 Receptors her2 ER 100%+, PR 80%+, ki67 15%   04/14/2015 Surgery right breast lumpectomy and sentinel lymph node biopsy. Surgical margins were negative.   04/14/2015 Pathology Results right breast invasive ductal carcinoma, grade 2, 2.7 cm,(+) DCIS, margins were negative, 4 sentinel lymph nodes and one axillary lymph nodes were negative, (+)lymphovascular invasion.   04/14/2015 Oncotype testing RS 30, which predicts 10-year risk of distance recurrence with tamoxifen alone 20%, intermediate risk   05/21/2015 -  Adjuvant Chemotherapy  Adjuvant chemotherapy with docetaxel 75 mg/m, and Cytoxan 600 mg/m, every 3 weeks, starting 05/21/2015, planning for total 4 cycles      Prior Therapy:  Kayla [name] underwent surgical resection of her tumor with a colostomy at the time of her surgery on 10/27/2008. The colostomy has been subsequently reversed on 09/20/2010. Kayla [name] received chemotherapy consisting of FOLFOX for 10 treatments from 12/10/2008 through 06/02/2009. She achieved a partial remission from these treatments. She then received additional chemotherapy with 5-FU, leucovorin, and 5-FU by continuous infusion along with Avastin from 06/23/2009 through 11/17/2009. 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   Current therapy:  Adjuvant chemotherapy with docetaxel 75 mg/m, and Cytoxan 600 mg/m, every 3 weeks, starting 05/21/2015, planning for total 4 cycles  Interim History:  Kayla [name] returns for follow-up and initiating adjuvant chemotherapy for her breast cancer. She is doing well overall, the pain and to the breast incision site has nearly resolved. She has recovered well from the surgery. She has good appetite and eating well, no other complaints.   Past Medical History  Diagnosis Date  . Pericardial effusion     echo 08/13/09  . LVH (left ventricular hypertrophy)     mod/severe. echo 1/11. EF 65-70%   . Hypertension   . Thrombocytopenia ([place])   . 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 Allergy     sulfa  . Colon cancer ([place])     colon/ 2010/surg/chemo  . History of radiation therapy 04/15/11    17 Gy single fraction  l parietal brain metastais  . Heart murmur   . Peripheral vascular disease ([place])   . Shortness of breath   .  Blood transfusion   . GERD (gastroesophageal reflux disease)   . Headache(784.0)   . Neuromuscular disorder ([place])     peripheral neuropathy feet  . Arthritis   . Diverticulosis 07/22/2010    sigmoid colon  . Family history of breast cancer   . Family history of colon cancer    GYN HISTORY  Menarchal: 14 LMP: 34 Contraceptive: a few years  HRT: no  G2P2:    Medications: I  have reviewed the patient's current medications.  Current Outpatient Prescriptions  Medication Sig Dispense Refill  . acetaminophen (TYLENOL) 500 MG tablet Take 1,000 mg by mouth every 6 (six) hours as needed.    [name] dexamethasone (DECADRON) 4 MG tablet Take 2 tablets (8 mg total) by mouth 2 (two) times daily. Start the day before Taxotere. Then again the day after chemo for 3 days. 30 tablet 1  . fluticasone (FLONASE) 50 MCG/ACT nasal spray Place 1 spray into both nostrils daily as needed.     . gabapentin (NEURONTIN) 300 MG capsule TAKE ONE CAPSULE BY MOUTH AT BEDTIME 90 capsule 0  . lisinopril (PRINIVIL,ZESTRIL) 40 MG tablet Take 1 tablet (40 mg total) by mouth daily. 90 tablet 0  . ondansetron (ZOFRAN) 8 MG tablet Take 1 tablet (8 mg total) by mouth 2 (two) times daily. Start the day after chemo for 3 days. Then take as needed for nausea or vomiting. 30 tablet 1  . oxyCODONE (OXY IR/ROXICODONE) 5 MG immediate release tablet Take 1-2 tablets (5-10 mg total) by mouth every 4 (four) hours as needed for moderate pain, severe pain or breakthrough pain. 40 tablet 0   No current facility-administered medications for this visit.   Facility-Administered Medications Ordered in Other Visits  Medication Dose Route Frequency Provider Last Rate Last Dose  . cyclophosphamide (CYTOXAN) 1,180 mg in sodium chloride 0.9 % 250 mL chemo infusion  600 mg/m2 (Treatment Plan Actual) Intravenous Once Truitt Merle, MD      . DOCEtaxel (TAXOTERE) 150 mg in dextrose 5 % 250 mL chemo infusion  75 mg/m2 (Treatment Plan Actual) Intravenous Once Truitt Merle, MD      . ondansetron (ZOFRAN) 16 mg, dexamethasone (DECADRON) 20 mg in sodium chloride 0.9 % 50 mL IVPB   Intravenous Once Truitt Merle, MD       Allergies:  Allergies  Allergen Reactions  . Sulfonamide Derivatives Rash    Oncology History: Diagnosis of colon cancer dates back to 10/27/2008 when Kayla [name] presented with what turned out to be a bowel perforation through tumor.  Stage at that time was T4 N1 M1 with pulmonary metastatic disease. The K-ras mutation was detected. Kayla [name] underwent surgical resection of her tumor with a colostomy at the time of her surgery on 10/27/2008. The colostomy has been subsequently reversed on 09/20/2010. Etrulia received chemotherapy consisting of FOLFOX for 10 treatments from 12/10/2008 through 06/02/2009. She achieved a partial remission from these treatments. She then received additional chemotherapy with 5-FU, leucovorin, and 5-FU by continuous infusion along with Avastin from 06/23/2009 through 11/17/2009. Syerra had some peripheral neuropathy in her feet from the oxaliplatin. She was doing well without evidence of disease until she developed a right hemiparesthesia in September 2012 and was found to have a 3 x 3 x 2.4 cm, lobulated, enhancing mass in the left parietal lobe with marked surrounding edema. A PET scan on 03/25/2011 showed resolution of the previously identified pulmonary nodules with no residual hypermetabolic activity. The pericardial effusion, which has been present since diagnosis, was unchanged.  There were also uterine fibroids and a low-density lesion in the anterior spleen that was stable and not associated with any hypermetabolic activity. Of note, [place] had a markedly elevated CEA up to 24.7 on 03/23/2011. On 06/23/2011, the CEA was less than 0.5. Dr. Erline Levine resected the recurrent metastatic colon cancer on 03/31/2011. This was followed by stereotactic radiation on 04/15/2011. The patient underwent a left-sided craniotomy and excision of the mass involving her left parietal lobe on 11/10/2011 by Dr. Erline Levine. The pathology report was negative for any malignancy. Pathology report indicated benign brain with fibrosis, hemorrhage, hemosiderin deposition, abundant dystrophic calcifications, and mixed acute and chronic inflammation including foreign body multinucleated giant cells.    Past Medical History, Surgical  history, Social history, and Family History were reviewed and updated.  SMOKING HISTORY:  The patient has smoked intermittently, about 2 packs of cigarettes per week, since she was a teenager, but stopped smoking in 2010.  Review of Systems: Constitutional:  Negative for fever, chills, night sweats, anorexia, weight loss, pain. Cardiovascular: no chest pain or dyspnea on exertion Respiratory: no cough, shortness of breath, or wheezing Neurological: no TIA or stroke symptoms Dermatological: negative for rash ENT: negative for - epistaxis, headaches, tinnitus, vertigo or visual changes Skin: Negative. Gastrointestinal: no abdominal pain, change in bowel habits, or black or bloody stools positive for - heartburn Genito-Urinary: no dysuria, trouble voiding, or hematuria Hematological and Lymphatic: negative for - bleeding problems, fatigue, jaundice or pallor Breast: negative for breast lumps Musculoskeletal: positive for - muscular weakness and pins and needles with some numbness (R>L) in lower extremities negative for - gait disturbance Remaining ROS negative.  Physical Exam: Blood pressure 138/68, pulse 94, temperature 97.9 F (36.6 C), temperature source Oral, resp. rate 20, height 5' 4" (1.626 m), weight 186 lb 14.4 oz (84.777 kg), SpO2 100 %. ECOG: 0 General appearance: alert, cooperative, appears stated age, no distress and mildly obese Head: Normocephalic, without obvious abnormality, atraumatic Neck: no adenopathy, supple, symmetrical, trachea midline and thyroid not enlarged, symmetric, no tenderness/mass/nodules HEENT: PERRLA; EOMi; No sclerae icterus. OP clear of masses.  Lymph nodes: Cervical, supraclavicular, and axillary nodes normal. Heart:regular rate and rhythm, S1, S2 normal, no murmur, click, rub or gallop Lung:chest clear, no wheezing, rales, normal symmetric air entry,  + R sided port-a-cath. Abdomin: soft, non-tender, without masses or organomegaly, normal bowel  sounds and surgical scars well healed. EXT:No peripheral edema.  R>L decreased sensation and mildly decreased strength (noted in prior exams) Neuro: R lower extremity weakness and decreased sensation.  Normal gait.  Good finger to nose bilaterally. Otherwise no focal deficits. Breasts: Breast inspection showed them to be symmetrical with no nipple discharge. The surgical incision in right breast and axilla are well-healed, mild tenderness. Palpation of the breasts and axilla revealed no obvious mass that I could appreciate.    Lab Results: CBC Latest Ref Rng 05/21/2015 04/14/2015 02/02/2015  WBC 3.9 - 10.3 10e3/uL 19.3(H) - 9.5  Hemoglobin 11.6 - 15.9 g/dL 14.2 12.7 13.9  Hematocrit 34.8 - 46.6 % 43.0 - 42.6  Platelets 145 - 400 10e3/uL 243 - 198    CMP Latest Ref Rng 05/21/2015 04/09/2015 03/12/2015  Glucose 70 - 140 mg/dl 167(H) 96 -  BUN 7.0 - 26.0 mg/dL 17.5 12 15.1  Creatinine 0.6 - 1.1 mg/dL 0.8 0.87 0.9  Sodium 136 - 145 mEq/L 139 142 -  Potassium 3.5 - 5.1 mEq/L 4.1 4.2 -  Chloride 101 - 111 mmol/L - 105 -  CO2 22 - 29 mEq/L 20(L) 29 -  Calcium 8.4 - 10.4 mg/dL 10.2 9.6 -  Total Protein 6.4 - 8.3 g/dL 7.7 - -  Total Bilirubin 0.20 - 1.20 mg/dL 0.58 - -  Alkaline Phos 40 - 150 U/L 120 - -  AST 5 - 34 U/L 37(H) - -  ALT 0 - 55 U/L 39 - -   CEA  Status: Finalresult Visible to patient:  Not Released Nextappt: 08/11/2014 at 09:40 AM in Radiology (AP-MM 1) Dx:  Metastatic cancer to brain           Ref Range 38moago  627mogo  [id number]  [id number]     CEA 0.0 - 5.0 ng/mL 0.9 1.3 1.4 1.1       PATHOLOGY REPORT Diagnosis 04/14/2015 1. Breast, lumpectomy, right - INVASIVE GRADE II DUCTAL CARCINOMA, SPANNING 2.7 CM IN GREATEST DIMENSION. - ASSOCIATED INTERMEDIATE GRADE DUCTAL CARCINOMA IN SITU. - LYMPH/VASCULAR INVASION IS IDENTIFIED. - MARGINS ARE NEGATIVE. - SEE ONCOLOGY TEMPLATE. 2. Lymph node, sentinel, biopsy, right axillary - ONE BENIGN LYMPH NODE WITH  NO TUMOR SEEN (0/1). 3. Lymph node, sentinel, biopsy, right axillary - ONE BENIGN LYMPH NODE WITH NO TUMOR SEEN (0/1). 4. Lymph node, sentinel, biopsy, right axillary - ONE BENIGN LYMPH NODE WITH NO TUMOR SEEN (0/1). 5. Lymph node, biopsy, right axillary - ONE BENIGN LYMPH NODE WITH NO TUMOR SEEN (0/1). Microscopic Comment 1. BREAST, INVASIVE TUMOR, WITH LYMPH NODES PRESENT Specimen, including laterality and lymph node sampling (sentinel, non-sentinel): Right partial breast with right sentinel lymph node sampling. Procedure: Right breast lumpectomy with right sentinel lymph node biopsies. Histologic type: Invasive ductal carcinoma. Grade: 2. Tubule formation: 3. Nuclear pleomorphism: 2. Mitotic: 1. Tumor size (gross measurement): 2.7 cm. Margins: Invasive, distance to closest margin: 0.2 cm (anterior margin). In-situ, distance to closest margin: At least 0.4 cm (all margins). Lymphovascular invasion: Yes, lymph/vascular invasion is identified. Ductal carcinoma in situ: Yes. Grade: Intermediate grade. Extensive intraductal component: No. Lobular neoplasia: Not identified. Tumor focality: Unifocal. Treatment effect: Not applicable. Extent of tumor: Tumor confined to breast parenchyma. Skin: Not received. Nipple: Not received. Skeletal muscle: Not received. Lymph nodes Examined: 3 Sentinel. 1 Non-sentinel. 4 Total. Lymph nodes with metastasis: 0. Isolated tumor cells (< 0.2 mm): 0. Micrometastasis: (> 0.2 mm and < 2.0 mm): 0. Macrometastasis: (> 2.0 mm): 0. Extracapsular extension: Not applicable. Breast prognostic profile: Performed on previous case [phone] receptor: 100%, positive. Progesterone receptor: 80%, positive. Her-2 neu: 1.30 ratio, negative. Ki-67: 15%. Non-neoplastic breast: Unremarkable. TNM: pT2, pN0. Comments: As Her-2 neu was previously negative, this will be repeated on representative tumor from the current specimen, and will be  reported in an addendum to follow. (RH:ds 04/15/15)  Results: HER2 - NEGATIVE RATIO OF HER2/CEP17 SIGNALS 1.17 AVERAGE HER2 COPY NUMBER PER CELL 2.05   Radiological Studies: 1. MRI of the head with and without IV contrast on 03/23/2011 showed a solitary enhancing mass in the left parietal lobe with surrounding white matter edema, most compatible with solitary metastatic deposit. The mass lesion measured 2.7 x 2.5 cm.  2. PET scan from 03/25/2011 showed resolution of the previously- identified pulmonary nodules with no residual hypermetabolic activity noted in the neck, chest, abdomen or pelvis to suggest active malignancy. There was a pericardial effusion and some subsegmental atelectasis in the lower lobes. There were also uterine fibroids and a low-density lesion in the anterior spleen that was stable and not associated with hypermetabolic activity.  3. MRI  of the head with and without IV contrast showed Stealth protocol utilized to evaluate left parietal mass. The mass measured 3 x 3 x 2.4 cm with marked surrounding vasogenic edema.  4. MRI of the head with and without IV contrast on 04/08/2011 showed postsurgical changes of tumor removal in the left parietal lobe and a postsurgical hematoma with enhancement. There was 3 mm of midline shift. No other metastatic deposits.  5. MRI of the head with and without IV contrast on 06/28/2011 showed a 3 cm area of restricted diffusion lying within the previous area of  left parietal surgical resection for colon cancer. There was enhancement of the peripheral aspect of the cavity, marked restricted diffusion and increasing vasogenic edema. There was concern about the development of a brain abscess.  6. MRI of the head with and without IV contrast on 07/29/2011 showed left parietal postoperative hematoma was smaller compared with the  prior study. There was no nodular enhancement seen to indicate tumor. No new enhancing lesions were seen. There was significant   improvement in the left parietal edema.  7. Digital screening mammogram was negative on 08/02/2011.  8. An MRI of the head with and without IV contrast on 10/21/2011 showed development of gyriform enhancement circumferentially along the margins of the left parietal postoperative space/hematoma. Marked increase in vasogenic edema was seen. 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 There was further contraction of the hematoma/postoperative space in the left parietal cortical region, now measuring 17 x 19 mm in transverse diameter as opposed to 22 x 21 mm previously.  Left-to-right shift was 2 mm.  9. Nuclear medicine brain PET-CT scan carried out on 10/27/2011 showed hypermetabolic activity in the high left parietal lobe which corresponds to gyriform enhancement on comparison MRI from 10/21/2011. This was felt to be most consistent with recurrent  tumor.  10. Chest x-ray, 2 view from 11/04/2011, showed chronic cardiomegaly. Port-A-Cath was in place. No acute disease was present.  11. MRI of the head with and without IV contrast on 01/27/2012 showed findings that were felt to be consistent with progression of disease. This exam was compared with the MRI of 10/21/2011. It was recognized that the patient underwent surgical resection of the mass on 11/10/2011.  There was felt to be on the present exam residual peripheral enhancement of approximately 35 x 40 x 21 mm with infiltration of the cortex and deep white matter. It was felt that the enhancement had progressed, despite removal of the central focus. There was moderate vasogenic  edema throughout the left hemisphere but slightly decreased from the MRI from 10/21/2011. No new lesions were seen. There was mild atrophy with chronic microvascular ischemic change, but no midline shift. Major intracranial vascular structures were patent.  12. MRI of the brain with and without IV contrast on 05/04/2012  showed previous left parietal lobe surgery for resection of tumor and radiation necrosis.  No new abnormalities were noted. 13. Chest x-ray, 2 view, on 07/03/2012 showed enlargement of the cardiac silhouette with some lingular scarring, as compared with the chest x-ray from 11/04/2011. 14. 2-D echocardiogram from 07/12/2012 showed left ventricular ejection fraction of 55-60%.  There was a small to moderate circumferential pericardial effusion with no signs of tamponade.  There was felt to be no significant change from the 2-D echocardiogram dated 08/13/2009. 15. Digital bilateral screening mammogram on 08/06/2012 was negative. 16. MRI of the head with and without IV contrast on 08/10/2012  showed stable to mildly regressed findings at the left parietal surgical site since 01/27/2012 following resection of radionecrosis.  There was no progression or new brain metastasis identified. 17. PET scan from 10/16/2012 showed no specific features to suggest metastatic disease.  There was diffuse uptake throughout the thyroid gland, possibly due to hyperthyroidism. 18. MRI of the brain with and without IV contrast on 03/12/2013 showed residual gyriform enhancement surrounding the area of previous surgical resection of a left parietal metastasis with subsequent resection of radionecrosis. The findings likely represent some residual brain injury, but there is no significant progression of radionecrosis and no new lesions seen. 19. MRI of the brain with and without IV contrast on 11/01/2013 showed Stable posttherapy appearance of the brain since May of 2014. No progression or new brain metastasis identified. 20. CT chest,abomen and pelvis with contrast. 1. No evidence of thoracic metastasis.  2. Small prevascular lymph node is similar prior. 3. Stable pericardial effusion. 4. Interval increase in the volume presacral lymph node in the upper pelvis. This has progressed slowly from 2011. Recommend attention on follow-up  versus restaging FDG PET scan. 5. Stable anastomosis in the descending colon. 21. CT chest, abdomen and pelvis with contrast 07/126/2015. 1. No evidence of local colon cancer recurrence or metastasis within the abdomen or pelvis. 2. Retention of stool at the colocolonic anastomosis in the left colon is unchanged comparison exam. No obstructing lesion identified.  22. Mesenteric lymph node is again demonstrated with no increased size. 4. Stable splenic lesion likely representing a complex cysts or hemangioma. 5. Stable small pericardial effusion 23. Brain MRI 11/11/2014: stable and satisfactory posttherapy appearance of the brain since 2014 and 2015. No new brain metastasis identified. 24. CT chest, abdomen and pelvis w contrast 02/02/2015: 1. No new or progressive findings to suggest recurrent colorectal, cancer in the chest, abdomen, or pelvis. 2. 16 mm soft tissue nodule in the medial right breast. Correlation with mammographic history recommended. 3. Fibroid change in the uterus. 4. Stable hypodense lesion in the anterior spleen, compatible with complex cyst or hemangioma. 5. Stable appearance of the trace pericardial effusion.   Impression and Plan: 64 year old African-American female, postmenopausal  1. Right breat invasive ductal carcinoma, pT2N0M0, stage IIA, ER+/PR+, HER2-, Oncotype RS 30 -I reviewed her surgical pathology findings with her in great details. -I reviewed her Oncotype DX test results. The recurrence score is 30, which predicts 10 year risk of distant recurrence 20% with tamoxifen alone. This is considered intermediate risk, but close to high risk group. -. Her recurrence score is very close to high risk group, and she is in good health overall, I recommend her to have adjuvant chemotherapy, with docetaxel and Cytoxan, intravenously every 3 weeks, for total of 4 cycles.  -The goal of treatment is curative. -Given this strong ER/PR positivity, I recommend adjuvant endocrine therapy  with aromatase inhibitor, after she completes breast radiation. -She would benefit from adjuvant breast radiation also. This will be followed up by her radiation oncologist Dr. Tammi Klippel -lab reviewed, adequate for treatment, will start cycle 1 chemotherapy today. Side effects especially neutropenia fever, reviewed with her again.  2. Stage IV Colon Cancer mets to lung and brain, NED --Kayla [name] continues to do well with no evidence for disease recurrence.  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   -She is clinically doing very well. Her CEA level has been normal.  -  her last brain MRI in August 2016  Was negative for recurrenc -her recent restaging CT scans in 01/2015 showed no evidence of recurrence - she is clinically doing well, physical exam is unremarkable, we'll continue observation. -follow up with lab CBC, CMP and CEA every 3 months.  3. Peripheral neuropathy, grade 1, secondary to chemotherapy  -stable  4 Genetics -Given her positive family and personal history of breast (Mother at age of 27 and cousin) and colon cancer (cousin), she was referred to genetic counselor -Her genetic test was negative  5. Leukocytosis -Likely secondary to steroids. No signs of infection.  She will continue follow-up with her primary care physician for other medical problems  Plan -Cycle 1 TC today, no neulasta  -I'll see her back in 2 weeks for toxicity check up  I spent 20 minutes counseling the patient face to face. The total time spent in the appointment was 25 minutes.   Truitt Merle  05/21/2015

## 2015-05-21 NOTE — Progress Notes (Signed)
Pt with slight nasal burning during cytoxan infusion, resolved by decreasing the rate.

## 2015-05-26 ENCOUNTER — Inpatient Hospital Stay (HOSPITAL_COMMUNITY)
Admission: EM | Admit: 2015-05-26 | Discharge: 2015-05-29 | DRG: 690 | Disposition: A | Discharge status: Home / Self Care | Payer: Medicare HMO | Attending: Internal Medicine | Admitting: Internal Medicine

## 2015-05-26 ENCOUNTER — Inpatient Hospital Stay (HOSPITAL_COMMUNITY): Payer: Medicare HMO

## 2015-05-26 ENCOUNTER — Other Ambulatory Visit: Payer: Self-pay

## 2015-05-26 ENCOUNTER — Emergency Department (HOSPITAL_COMMUNITY): Payer: Medicare HMO

## 2015-05-26 ENCOUNTER — Encounter (HOSPITAL_COMMUNITY): Payer: Self-pay | Admitting: Emergency Medicine

## 2015-05-26 DIAGNOSIS — C50211 Malignant neoplasm of upper-inner quadrant of right female breast: Secondary | ICD-10-CM | POA: Diagnosis present

## 2015-05-26 DIAGNOSIS — Z85038 Personal history of other malignant neoplasm of large intestine: Secondary | ICD-10-CM

## 2015-05-26 DIAGNOSIS — I319 Disease of pericardium, unspecified: Secondary | ICD-10-CM | POA: Diagnosis not present

## 2015-05-26 DIAGNOSIS — R109 Unspecified abdominal pain: Secondary | ICD-10-CM | POA: Diagnosis not present

## 2015-05-26 DIAGNOSIS — Z79899 Other long term (current) drug therapy: Secondary | ICD-10-CM | POA: Diagnosis not present

## 2015-05-26 DIAGNOSIS — E872 Acidosis: Secondary | ICD-10-CM | POA: Diagnosis present

## 2015-05-26 DIAGNOSIS — R55 Syncope and collapse: Secondary | ICD-10-CM | POA: Diagnosis present

## 2015-05-26 DIAGNOSIS — I119 Hypertensive heart disease without heart failure: Secondary | ICD-10-CM | POA: Diagnosis present

## 2015-05-26 DIAGNOSIS — R42 Dizziness and giddiness: Secondary | ICD-10-CM | POA: Diagnosis not present

## 2015-05-26 DIAGNOSIS — N2889 Other specified disorders of kidney and ureter: Secondary | ICD-10-CM | POA: Diagnosis present

## 2015-05-26 DIAGNOSIS — E860 Dehydration: Secondary | ICD-10-CM | POA: Diagnosis present

## 2015-05-26 DIAGNOSIS — Z87891 Personal history of nicotine dependence: Secondary | ICD-10-CM | POA: Diagnosis not present

## 2015-05-26 DIAGNOSIS — Z9221 Personal history of antineoplastic chemotherapy: Secondary | ICD-10-CM

## 2015-05-26 DIAGNOSIS — Z833 Family history of diabetes mellitus: Secondary | ICD-10-CM | POA: Diagnosis not present

## 2015-05-26 DIAGNOSIS — N179 Acute kidney failure, unspecified: Secondary | ICD-10-CM | POA: Diagnosis not present

## 2015-05-26 DIAGNOSIS — D696 Thrombocytopenia, unspecified: Secondary | ICD-10-CM | POA: Diagnosis present

## 2015-05-26 DIAGNOSIS — I313 Pericardial effusion (noninflammatory): Secondary | ICD-10-CM | POA: Diagnosis present

## 2015-05-26 DIAGNOSIS — N390 Urinary tract infection, site not specified: Principal | ICD-10-CM | POA: Diagnosis present

## 2015-05-26 DIAGNOSIS — Z7952 Long term (current) use of systemic steroids: Secondary | ICD-10-CM

## 2015-05-26 DIAGNOSIS — Z882 Allergy status to sulfonamides status: Secondary | ICD-10-CM | POA: Diagnosis not present

## 2015-05-26 DIAGNOSIS — Z79891 Long term (current) use of opiate analgesic: Secondary | ICD-10-CM

## 2015-05-26 DIAGNOSIS — C50219 Malignant neoplasm of upper-inner quadrant of unspecified female breast: Secondary | ICD-10-CM | POA: Diagnosis present

## 2015-05-26 DIAGNOSIS — E876 Hypokalemia: Secondary | ICD-10-CM | POA: Diagnosis present

## 2015-05-26 DIAGNOSIS — B9620 Unspecified Escherichia coli [E. coli] as the cause of diseases classified elsewhere: Secondary | ICD-10-CM | POA: Diagnosis present

## 2015-05-26 DIAGNOSIS — C7931 Secondary malignant neoplasm of brain: Secondary | ICD-10-CM | POA: Diagnosis not present

## 2015-05-26 DIAGNOSIS — C50911 Malignant neoplasm of unspecified site of right female breast: Secondary | ICD-10-CM

## 2015-05-26 DIAGNOSIS — C7949 Secondary malignant neoplasm of other parts of nervous system: Secondary | ICD-10-CM | POA: Diagnosis present

## 2015-05-26 DIAGNOSIS — Z8249 Family history of ischemic heart disease and other diseases of the circulatory system: Secondary | ICD-10-CM | POA: Diagnosis not present

## 2015-05-26 DIAGNOSIS — Z800 Family history of malignant neoplasm of digestive organs: Secondary | ICD-10-CM

## 2015-05-26 DIAGNOSIS — Z803 Family history of malignant neoplasm of breast: Secondary | ICD-10-CM | POA: Diagnosis not present

## 2015-05-26 DIAGNOSIS — M545 Low back pain: Secondary | ICD-10-CM | POA: Diagnosis not present

## 2015-05-26 DIAGNOSIS — N201 Calculus of ureter: Secondary | ICD-10-CM | POA: Diagnosis not present

## 2015-05-26 DIAGNOSIS — R0981 Nasal congestion: Secondary | ICD-10-CM | POA: Diagnosis not present

## 2015-05-26 DIAGNOSIS — I3139 Other pericardial effusion (noninflammatory): Secondary | ICD-10-CM | POA: Diagnosis present

## 2015-05-26 LAB — PROCALCITONIN: Procalcitonin: <0.1 ng/mL

## 2015-05-26 LAB — CBC
HCT: 42.6 % (ref 36.0–46.0)
Hemoglobin: 13.8 g/dL (ref 12.0–15.0)
MCH: 27.7 pg (ref 26.0–34.0)
MCHC: 32.4 g/dL (ref 30.0–36.0)
MCV: 85.4 fL (ref 78.0–100.0)
PLATELETS: 60 10*3/uL — AB (ref 150–400)
RBC: 4.99 MIL/uL (ref 3.87–5.11)
RDW: 14.2 % (ref 11.5–15.5)
WBC: 8.1 10*3/uL (ref 4.0–10.5)

## 2015-05-26 LAB — BASIC METABOLIC PANEL
Anion gap: 7 (ref 5–15)
BUN: 27 mg/dL — ABNORMAL HIGH (ref 6–20)
CALCIUM: 8.6 mg/dL — AB (ref 8.9–10.3)
CHLORIDE: 100 mmol/L — AB (ref 101–111)
CO2: 30 mmol/L (ref 22–32)
CREATININE: 1.14 mg/dL — AB (ref 0.44–1.00)
GFR calc non Af Amer: 50 mL/min — ABNORMAL LOW (ref >60)
GFR, EST AFRICAN AMERICAN: 58 mL/min — AB (ref >60)
Glucose, Bld: 117 mg/dL — ABNORMAL HIGH (ref 65–99)
Potassium: 3 mmol/L — ABNORMAL LOW (ref 3.5–5.1)
Sodium: 137 mmol/L (ref 135–145)

## 2015-05-26 LAB — LACTIC ACID, PLASMA
LACTIC ACID, VENOUS: 1.7 mmol/L (ref 0.5–2.0)
Lactic Acid, Venous: 1.2 mmol/L (ref 0.5–2.0)
Lactic Acid, Venous: 1.2 mmol/L (ref 0.5–2.0)

## 2015-05-26 LAB — TROPONIN I: Troponin I: <0.03 ng/mL (ref <0.031)

## 2015-05-26 LAB — I-STAT CG4 LACTIC ACID, ED
LACTIC ACID, VENOUS: 2.45 mmol/L — AB (ref 0.5–2.0)
Lactic Acid, Venous: 2.9 mmol/L (ref 0.5–2.0)

## 2015-05-26 LAB — URINALYSIS, ROUTINE W REFLEX MICROSCOPIC
Bilirubin Urine: NEGATIVE
Glucose, UA: NEGATIVE mg/dL
Hgb urine dipstick: NEGATIVE
KETONES UR: NEGATIVE mg/dL
NITRITE: POSITIVE — AB
PROTEIN: NEGATIVE mg/dL
Specific Gravity, Urine: 1.042 — ABNORMAL HIGH (ref 1.005–1.030)
UROBILINOGEN UA: 0.2 mg/dL (ref 0.0–1.0)
pH: 7 (ref 5.0–8.0)

## 2015-05-26 LAB — URINE MICROSCOPIC-ADD ON

## 2015-05-26 LAB — LIPASE, BLOOD: LIPASE: 42 U/L (ref 11–51)

## 2015-05-26 LAB — I-STAT TROPONIN, ED: Troponin i, poc: 0.01 ng/mL (ref 0.00–0.08)

## 2015-05-26 LAB — MRSA PCR SCREENING: MRSA BY PCR: NEGATIVE

## 2015-05-26 LAB — MAGNESIUM: Magnesium: 2.2 mg/dL (ref 1.7–2.4)

## 2015-05-26 MED ORDER — OXYCODONE HCL 5 MG PO TABS
5.0000 mg | ORAL_TABLET | ORAL | Status: DC | PRN
  Administered 2015-05-26 – 2015-05-27 (×2): 5 mg via ORAL
  Filled 2015-05-26 (×3): qty 1

## 2015-05-26 MED ORDER — DEXTROSE 5 % IV SOLN
2.0000 g | INTRAVENOUS | Status: DC
  Filled 2015-05-26 (×2): qty 2

## 2015-05-26 MED ORDER — SODIUM CHLORIDE 0.9 % IJ SOLN
3.0000 mL | Freq: Two times a day (BID) | INTRAMUSCULAR | Status: DC
  Administered 2015-05-26 – 2015-05-29 (×2): 3 mL via INTRAVENOUS

## 2015-05-26 MED ORDER — MORPHINE SULFATE (PF) 4 MG/ML IV SOLN
4.0000 mg | Freq: Once | INTRAVENOUS | Status: AC
  Administered 2015-05-26: 4 mg via INTRAVENOUS
  Filled 2015-05-26: qty 1

## 2015-05-26 MED ORDER — SODIUM CHLORIDE 0.9 % IV BOLUS (SEPSIS)
1000.0000 mL | Freq: Once | INTRAVENOUS | Status: AC
  Administered 2015-05-26: 1000 mL via INTRAVENOUS

## 2015-05-26 MED ORDER — POTASSIUM CHLORIDE IN NACL 20-0.9 MEQ/L-% IV SOLN
INTRAVENOUS | Status: DC
  Administered 2015-05-26 – 2015-05-27 (×3): via INTRAVENOUS
  Filled 2015-05-26 (×4): qty 1000

## 2015-05-26 MED ORDER — IOHEXOL 350 MG/ML SOLN
100.0000 mL | Freq: Once | INTRAVENOUS | Status: AC | PRN
  Administered 2015-05-26: 180 mL via INTRAVENOUS

## 2015-05-26 MED ORDER — DEXTROSE 5 % IV SOLN
1.0000 g | Freq: Once | INTRAVENOUS | Status: AC
  Administered 2015-05-26: 1 g via INTRAVENOUS
  Filled 2015-05-26: qty 10

## 2015-05-26 MED ORDER — FLUTICASONE PROPIONATE 50 MCG/ACT NA SUSP: 1.0000 | Freq: Every day | NASAL | Status: DC | PRN

## 2015-05-26 MED ORDER — DEXTROSE 5 % IV SOLN: 1.0000 g | INTRAVENOUS | Status: DC

## 2015-05-26 MED ORDER — GABAPENTIN 300 MG PO CAPS
300.0000 mg | ORAL_CAPSULE | Freq: Every day | ORAL | Status: DC
  Administered 2015-05-26 – 2015-05-28 (×3): 300 mg via ORAL
  Filled 2015-05-26 (×3): qty 1

## 2015-05-26 MED ORDER — DEXTROSE 5 % IV SOLN
2.0000 g | Freq: Once | INTRAVENOUS | Status: DC
  Filled 2015-05-26: qty 2

## 2015-05-26 MED ORDER — POTASSIUM CHLORIDE CRYS ER 20 MEQ PO TBCR
40.0000 meq | EXTENDED_RELEASE_TABLET | Freq: Once | ORAL | Status: AC
Start: 2015-05-26 — End: 2015-05-26
  Administered 2015-05-26: 40 meq via ORAL
  Filled 2015-05-26: qty 2

## 2015-05-26 MED ORDER — ONDANSETRON HCL 4 MG/2ML IJ SOLN
4.0000 mg | Freq: Once | INTRAMUSCULAR | Status: AC
  Administered 2015-05-26: 4 mg via INTRAVENOUS
  Filled 2015-05-26: qty 2

## 2015-05-26 NOTE — ED Notes (Signed)
Pt states that her back hurts and is lightheaded. Family states pt had syncopal episode about 1.5 hours ago. Pt last chemo tx on last Thursday Nov. 3

## 2015-05-26 NOTE — Progress Notes (Addendum)
ANTIBIOTIC CONSULT NOTE - INITIAL  Pharmacy Consult for Ceftriaxone Indication: UTI  Allergies  Allergen Reactions  . Sulfonamide Derivatives Rash    Patient Measurements: Height: '5\' 4"'$  (162.6 cm) Weight: 186 lb (84.369 kg) IBW/kg (Calculated) : 54.7  Vital Signs: Temp: 98 F (36.7 C) (11/08 1735) Temp Source: Oral (11/08 1735) BP: 112/92 mmHg (11/08 1735) Pulse Rate: 83 (11/08 1735) Intake/Output from previous day:    Labs:  Recent Labs  05/26/15 1220  WBC 8.1  HGB 13.8  PLT 60*  CREATININE 1.14*   Estimated Creatinine Clearance: 53.8 mL/min (by C-G formula based on Cr of 1.14). No results for input(s): VANCOTROUGH, VANCOPEAK, VANCORANDOM, GENTTROUGH, GENTPEAK, GENTRANDOM, TOBRATROUGH, TOBRAPEAK, TOBRARND, AMIKACINPEAK, AMIKACINTROU, AMIKACIN in the last 72 hours.   Microbiology: No results found for this or any previous visit (from the past 720 hour(s)).  Medical History: Past Medical History  Diagnosis Date  . Pericardial effusion     echo 08/13/09  . LVH (left ventricular hypertrophy)     mod/severe. echo 1/11. EF 65-70%   . Hypertension   . Thrombocytopenia ([place])   . 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 Allergy     sulfa  . Colon cancer ([place])     colon/ 2010/surg/chemo  . History of radiation therapy 04/15/11    17 Gy single fraction  l parietal brain metastais  . Heart murmur   . Peripheral vascular disease ([place])   . Shortness of breath   . Blood transfusion   . GERD (gastroesophageal reflux disease)   . Headache(784.0)   . Neuromuscular disorder ([place])     peripheral neuropathy feet  . Arthritis   . Diverticulosis 07/22/2010    sigmoid colon  . Family history of breast cancer   . Family history of colon cancer     Medications:  Anti-infectives    Start     Dose/Rate Route Frequency Ordered Stop   05/26/15 1515  cefTRIAXone (ROCEPHIN) 1 g in dextrose 5 % 50 mL IVPB     1 g 100 mL/hr over 30 Minutes Intravenous   Once 05/26/15 1509 05/26/15 1617     Assessment: 21 yoF [facility] ED with back pain, syncope, and dizziness.  PMH significant for breast cancer, currently receiving chemotherapy with most recent infusion on 11/3.  UA shows many bacteria, 7-10 WBC, nitrite positive.  Lactic acid is elevated. She had a first dose of ceftriaxone in the ED and Pharmacy is consulted to continue dosing ceftriaxone for UTI, possible pyelonephritis.  Today, 05/26/2015:  Tmax: afebrile  WBC 8.1  SCr 1.14 with CrCl ~ 54 ml/min  Urine and blood cultures pending  Goal of Therapy:  Appropriate abx dosing, eradication of infection.   Plan:   Ceftriaxone 2g IV q24h  Dosage remains stable and need for further dosage adjustment appears unlikely at present.    Pharmacy will sign off at this time.  Please reconsult if a change in clinical status warrants re-evaluation of dosage.   Gretta Arab PharmD, BCPS Pager [phone] 05/26/2015 6:06 PM

## 2015-05-26 NOTE — ED Notes (Signed)
Dr.Floyd and RN notified of pt's Lactic Acid of 2.45

## 2015-05-26 NOTE — H&P (Signed)
History and Physical  Kayla [name]:[id number] DOB: [birth year] DOA: 05/26/2015  Referring physician: EDP PCP: No PCP Per Patient   Chief Complaint: syncope  HPI: Kayla [name] is a 64 y.o. female   With h/o stage iv colon cancer in remission, newly diagnosed stage IIa breast CA (right), s/p chemo on 11/3, she developed low back pain last night, the pain lasted whole night, she did not sleep, she also noticed urine color has become darker in the last few days, today , she was walking from the kitchen to the bathroom, started feeling dizzy and passed out, with LOC for a few seconds, family heard her fall and helped her, she returned back to baseline shortly, no confusion, no chest pain, no palpitation, no seizure activity. She presented to [facility] ED, vital stable, labs showed UTI, lactic acidosis, thrombocytopenia, elevated cr, CTA no PE, she was given ivf and rocephin, hospitalist called to admit the patient. Patient reported feeling better after , denies pain when I examined her.  Review of Systems:  Detail per HPI, Review of systems are otherwise negative  Past Medical History  Diagnosis Date  . Pericardial effusion     echo 08/13/09  . LVH (left ventricular hypertrophy)     mod/severe. echo 1/11. EF 65-70%   . Hypertension   . Thrombocytopenia ([facility])   . 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 Allergy     sulfa  . Colon cancer ([facility])     colon/ 2010/surg/chemo  . History of radiation therapy 04/15/11    17 Gy single fraction  l parietal brain metastais  . Heart murmur   . Peripheral vascular disease ([facility])   . Shortness of breath   . Blood transfusion   . GERD (gastroesophageal reflux disease)   . Headache(784.0)   . Neuromuscular disorder ([facility])     peripheral neuropathy feet  . Arthritis   . Diverticulosis 07/22/2010    sigmoid colon  . Family history of breast cancer   . Family history of colon cancer    Past Surgical History  Procedure Laterality  Date  . Rotator cuff repair      rt  . Tonsillectomy    . Tubal ligation    . Craniotomy  03/31/11    left parietal mass resection  . Colostomy closure    . Tonsillectomy    . Colon surgery    . Portacath placement      10  . Craniotomy  11/10/2011    Procedure: CRANIOTOMY TUMOR EXCISION;  Surgeon: [name], MD;  Location: [facility];  Service: Neurosurgery;  Laterality: N/A;  Craniotomy for Biopsy of Tumor  . Colonoscopy  07/22/2010  . Breast lumpectomy with radioactive seed and sentinel lymph node biopsy Right 04/14/2015    Procedure: RIGHT BREAST LUMPECTOMY WITH RADIOACTIVE SEED ANDAXILLARY SENTINEL LYMPH NODE BIOPSY;  Surgeon: Avel Peace, MD;  Location: [facility];  Service: General;  Laterality: Right;   Social History:  reports that she quit smoking about 6 years ago. Her smoking use included Cigarettes. She has a 9.25 pack-year smoking history. She has never used smokeless tobacco. She reports that she does not drink alcohol or use illicit drugs. Patient lives at home & is able to participate in activities of daily living independently   Allergies  Allergen Reactions  . Sulfonamide Derivatives Rash    Family History  Problem Relation Age of Onset  . Breast cancer Mother 70  .  Diabetes Father   . Coronary artery disease Father   . Gout Brother   . Breast cancer Cousin     dx in her 52s  . Colon cancer Cousin     mother's maternal first cousin  . Diabetes Maternal Aunt   . Cirrhosis Brother       Prior to Admission medications   Medication Sig Start Date End Date Taking? Authorizing Provider  acetaminophen (TYLENOL) 500 MG tablet Take 1,000 mg by mouth every 6 (six) hours as needed for mild pain, moderate pain or headache.    Yes Historical Provider, MD  dexamethasone (DECADRON) 4 MG tablet Take 2 tablets (8 mg total) by mouth 2 (two) times daily. Start the day before Taxotere. Then again the day after chemo for 3 days. 05/05/15  Yes Malachy Mood,  MD  fluticasone (FLONASE) 50 MCG/ACT nasal spray Place 1 spray into both nostrils daily as needed for allergies.  10/09/14  Yes Historical Provider, MD  gabapentin (NEURONTIN) 300 MG capsule TAKE ONE CAPSULE BY MOUTH AT BEDTIME Patient taking differently: TAKE 300 MG BY MOUTH AT BEDTIME 01/25/14  Yes Myra Rude, MD  lisinopril (PRINIVIL,ZESTRIL) 40 MG tablet Take 1 tablet (40 mg total) by mouth daily. 02/17/14  Yes Myra Rude, MD  ondansetron (ZOFRAN) 8 MG tablet Take 1 tablet (8 mg total) by mouth 2 (two) times daily. Start the day after chemo for 3 days. Then take as needed for nausea or vomiting. 05/05/15  Yes Malachy Mood, MD  oxyCODONE (OXY IR/ROXICODONE) 5 MG immediate release tablet Take 1-2 tablets (5-10 mg total) by mouth every 4 (four) hours as needed for moderate pain, severe pain or breakthrough pain. 04/14/15  Yes Avel Peace, MD    Physical Exam: BP 105/59 mmHg  Pulse 82  Temp(Src) 98 F (36.7 C) (Oral)  Resp 18  SpO2 96%  General:  NAD Eyes: PERRL ENT: unremarkable Neck: supple, no JVD Cardiovascular: RRR Respiratory: CTABL, right port-a-cath, breast exam deferred Abdomen: soft/ND/ND, positive bowel sounds Skin: no rash Musculoskeletal:  No edema, no pain on palpation of lumber spine. Psychiatric: calm/cooperative Neurologic: no focal findings            Labs on Admission:  Basic Metabolic Panel:  Recent Labs Lab 05/21/15 0838 05/26/15 1220  NA 139 137  K 4.1 3.0*  CL  --  100*  CO2 20* 30  GLUCOSE 167* 117*  BUN 17.5 27*  CREATININE 0.8 1.14*  CALCIUM 10.2 8.6*   Liver Function Tests:  Recent Labs Lab 05/21/15 0838  AST 37*  ALT 39  ALKPHOS 120  BILITOT 0.58  PROT 7.7  ALBUMIN 3.9   No results for input(s): LIPASE, AMYLASE in the last 168 hours. No results for input(s): AMMONIA in the last 168 hours. CBC:  Recent Labs Lab 05/21/15 0838 05/26/15 1220  WBC 19.3* 8.1  NEUTROABS 17.2*  --   HGB 14.2 13.8  HCT 43.0 42.6  MCV 85.5 85.4    PLT 243 60*   Cardiac Enzymes: No results for input(s): CKTOTAL, CKMB, CKMBINDEX, TROPONINI in the last 168 hours.  BNP (last 3 results) No results for input(s): BNP in the last 8760 hours.  ProBNP (last 3 results) No results for input(s): PROBNP in the last 8760 hours.  CBG: No results for input(s): GLUCAP in the last 168 hours.  Radiological Exams on Admission: Ct Angio Chest Pe W/cm &/or Wo Cm  05/26/2015  CLINICAL DATA:  Lightheadedness and back pain with syncopal episode 1.5 hours  ago. Chemotherapy treatment 5 days ago for breast cancer. History of colon cancer. EXAM: CT ANGIOGRAPHY CHEST WITH CONTRAST TECHNIQUE: Multidetector CT imaging of the chest was performed using the standard protocol during bolus administration of intravenous contrast. Multiplanar CT image reconstructions and MIPs were obtained to evaluate the vascular anatomy. CONTRAST:  OMNIPAQUE IOHEXOL 350 MG/ML SOLN. Scan had to be repeated due to scanner error during first injection. COMPARISON:  02/02/2015 FINDINGS: Right IJ Port-A-Cath with tip over the SVC just above the cavoatrial junction. Lungs are adequately inflated with minimal bibasilar dependent atelectasis. No evidence of effusion. Airways are normal. Again noted is a mild to moderate pericardial effusion slightly worse compared to the prior exam. There is no evidence of pulmonary emboli. Minimal calcified plaque over the thoracic aorta. No evidence of mediastinal, hilar or axillary adenopathy. Postsurgical change over the medial upper right breast compatible prior lumpectomy. Seroma over the lumpectomy site measuring 1.8 x 4.1 cm. Images through the upper abdomen are within normal. There are degenerative changes of the spine. Review of the MIP images confirms the above findings. IMPRESSION: No evidence pulmonary embolism.  No acute pulmonary disease. Slight interval progression of small to moderate pericardial effusion. Postsurgical change compatible previous  lumpectomy of the upper inner right breast. Right IJ Port-A-Cath unchanged. Electronically Signed   By: Elberta Fortis M.D.   On: 05/26/2015 14:37   Ct Lumbar Spine Wo Contrast  05/26/2015  CLINICAL DATA:  Back pain.  Lightheadedness. EXAM: CT LUMBAR SPINE WITHOUT CONTRAST TECHNIQUE: Multidetector CT imaging of the lumbar spine was performed without intravenous contrast administration. Multiplanar CT image reconstructions were also generated. COMPARISON:  CT abdomen and pelvis 02/02/2015. CT chest performed concurrently, reported separately. FINDINGS: Anatomic alignment. Transitional anatomy with partial sacralization L5 on the RIGHT. Intervertebral disc spaces are preserved in height. Dilated collecting systems and ureters appear increased from prior CT abdomen and pelvis from July 2016. It is possibly secondary to known large fibroid uterus with mild degree of obstruction, but recurrent colon cancer not excluded. Consider CT abdomen and pelvis with contrast for further surveillance. No definite compressive lesion at L1-2, L2-3, or L3-4. At L4-5 there is a central and leftward disc extrusion with posterior element hypertrophy. Moderate stenosis is suspected. BILATERAL L4 and L5 nerve root impingement likely. At L5-S1 there is a moderate-sized calcified disc protrusion/disc osteophyte complex centrally. No definite subarticular zone or foraminal zone narrowing. IMPRESSION: Transitional anatomy.  L5 is partially sacralized on the RIGHT. Altered biomechanics secondary to transitional anatomy, leads to significant the disc pathology and posterior element disease at L4-5 where there is a central and leftward extrusion, with advanced posterior element hypertrophy and moderate stenosis. BILATERAL L4 and L5 nerve root impingement are likely. Prominent caliectasis and ureterectasis, increased from priors, greater on the RIGHT. Significance uncertain. Consider CT abdomen pelvis for further evaluation. Electronically Signed    By: Elsie Stain M.D.   On: 05/26/2015 14:56    EKG: Independently reviewed. NSR, no acute st/t changes  Assessment/Plan Present on Admission:  . UTI (lower urinary tract infection) . Syncope  Syncope: likely from uti/sepsis/dehydration, CTA no PE, +pericardioeffusion, echo ordered, check cardiac enzymes ( denies chest pain or sob), EKG NSR, no acute ST/T changes, does c/o some headache, will check ct head, admit to med tele, continue cycle troponin/lactic acid.  Pericardial effusion: hemodynamically stable, denies chest pain, no sob, echo pending.  UTI with lactic acidosis, no leukocytosis (though recent chemo on 11/3), no fever, no hypothermia, does has elevated  cr, s/p 1liter ns in the ED, will give another liter of NS, then ivf ns with 20k at 75cc/hr. Continue rocephin which was started in the ED, blood/urine culture pending.  Acute renal injury, cr 1.14 (baseline 0.8), from dehydration/uti. Renal US pending, ivf/abx, hold acei.  Hypokalemia: replace k, check mag  Lower back pain: musculoskelatal? No cva tenderness,  Ct  Renal stone study with contrast obtained from the ED, no report of renal or ureter abnormalities. CT lumbar spine obtained in the ED showed L4-5 disc extrusion and possible bilateral l4-l5 nerve root impingement. Currently pain free after pain meds in the ED, Bed rest, prn pain meds for now.   Prominent caliectasis and ureterectasis on CT chest :  ct renal stone study as mentioned above, no renal or ureter abnormality reported, urine no significant rbc. Renal US pending.  Stage IIA Breast CA (right upper inner quadrant): newly diagnosed (02/2015), s/p docetaxel and cyclophosphamide on 11/3. Defer to oncology.  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  DVT prophylaxis: scd (thrombocytopenia)  Consultants: none  Code Status: full   Family Communication:  Patient and family members in room  Disposition Plan: admit to med tele  Time spent:   Inola Lisle MD, PhD [facility] Pager [phone] If 7PM-7AM, please contact night-coverage at [email or web address], password [facility]

## 2015-05-26 NOTE — ED Notes (Addendum)
1635-

## 2015-05-26 NOTE — ED Provider Notes (Signed)
CSN: [id number]     Arrival date & time 05/26/15  1156 History   First MD Initiated Contact with Patient 05/26/15 1217     Chief Complaint  Patient presents with  . chemo card   . Back Pain  . Loss of Consciousness  . Dizziness     (Consider location/radiation/quality/duration/timing/severity/associated sxs/prior Treatment) Patient is a 64 y.o. female presenting with syncope. The history is provided by the patient.  Loss of Consciousness Episode history:  Single Most recent episode:  Today Duration:  2 seconds Timing:  Constant Progression:  Resolved Chronicity:  New Context: exertion   Witnessed: no   Relieved by:  Lying down Worsened by:  Nothing tried Ineffective treatments:  None tried Associated symptoms: dizziness   Associated symptoms: no chest pain, no fever, no headaches, no nausea, no palpitations, no shortness of breath and no vomiting     64 yo F with a chief complaint of syncope. This started about 2 hours ago. Patient states she was walking from the kitchen to the bathroom midway started feeling lightheaded and dropped to the ground. Thinks it lasted just a few seconds. Was quickly back and oriented. Denies loss of bowel or bladder. Had been feeling a bit lightheaded and having some lower back pain for the past couple days. Denies prior head injury. Denies head injury with this incident. Patient denies fevers or chills. Patient has had some mild abdominal pain that is not present currently. Denies any dysuria flank pain. Has a history of breast cancer is currently on chemotherapy.  Past Medical History  Diagnosis Date  . Pericardial effusion     echo 08/13/09  . LVH (left ventricular hypertrophy)     mod/severe. echo 1/11. EF 65-70%   . Hypertension   . Thrombocytopenia ([place])   . 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 Allergy     sulfa  . Colon cancer ([place])     colon/ 2010/surg/chemo  . History of radiation therapy 04/15/11    17 Gy  single fraction  l parietal brain metastais  . Heart murmur   . Peripheral vascular disease ([place])   . Shortness of breath   . Blood transfusion   . GERD (gastroesophageal reflux disease)   . Headache(784.0)   . Neuromuscular disorder ([place])     peripheral neuropathy feet  . Arthritis   . Diverticulosis 07/22/2010    sigmoid colon  . Family history of breast cancer   . Family history of colon cancer    Past Surgical History  Procedure Laterality Date  . Rotator cuff repair      rt  . Tonsillectomy    . Tubal ligation    . Craniotomy  03/31/11    left parietal mass resection  . Colostomy closure    . Tonsillectomy    . Colon surgery    . Portacath placement      10  . Craniotomy  11/10/2011    Procedure: CRANIOTOMY TUMOR EXCISION;  Surgeon: Erline Levine, MD;  Location: [facility];  Service: Neurosurgery;  Laterality: N/A;  Craniotomy for Biopsy of Tumor  . Colonoscopy  07/22/2010  . Breast lumpectomy with radioactive seed and sentinel lymph node biopsy Right 04/14/2015    Procedure: RIGHT BREAST LUMPECTOMY WITH RADIOACTIVE SEED ANDAXILLARY SENTINEL LYMPH NODE BIOPSY;  Surgeon: Jackolyn Confer, MD;  Location: [facility];  Service: General;  Laterality: Right;   Family History  Problem Relation Age of Onset  .  Breast cancer Mother 65  . Diabetes Father   . Coronary artery disease Father   . Gout Brother   . Breast cancer Cousin     dx in her 50s  . Colon cancer Cousin     mother's maternal first cousin  . Diabetes Maternal Aunt   . Cirrhosis Brother    Social History  Substance Use Topics  . Smoking status: Former Smoker -- 0.25 packs/day for 37 years    Types: Cigarettes    Quit date: 11/03/2008  . Smokeless tobacco: Never Used     Comment: 30 pack year hx   . Alcohol Use: No     Comment: rarely   OB History    No data available     Review of Systems  Constitutional: Negative for fever and chills.  HENT: Positive for congestion. Negative for  rhinorrhea.   Eyes: Negative for redness and visual disturbance.  Respiratory: Negative for shortness of breath and wheezing.   Cardiovascular: Positive for syncope. Negative for chest pain and palpitations.  Gastrointestinal: Negative for nausea, vomiting and constipation.  Genitourinary: Negative for dysuria and urgency.  Musculoskeletal: Positive for back pain. Negative for myalgias and arthralgias.  Skin: Negative for pallor and wound.  Neurological: Positive for dizziness and syncope. Negative for headaches.      Allergies  Sulfonamide derivatives  Home Medications   Prior to Admission medications   Medication Sig Start Date End Date Taking? Authorizing Provider  acetaminophen (TYLENOL) 500 MG tablet Take 1,000 mg by mouth every 6 (six) hours as needed for mild pain, moderate pain or headache.    Yes Historical Provider, MD  dexamethasone (DECADRON) 4 MG tablet Take 2 tablets (8 mg total) by mouth 2 (two) times daily. Start the day before Taxotere. Then again the day after chemo for 3 days. 05/05/15  Yes Truitt Merle, MD  fluticasone (FLONASE) 50 MCG/ACT nasal spray Place 1 spray into both nostrils daily as needed for allergies.  10/09/14  Yes Historical Provider, MD  gabapentin (NEURONTIN) 300 MG capsule TAKE ONE CAPSULE BY MOUTH AT BEDTIME Patient taking differently: TAKE 300 MG BY MOUTH AT BEDTIME 01/25/14  Yes Concha Norway, MD  lisinopril (PRINIVIL,ZESTRIL) 40 MG tablet Take 1 tablet (40 mg total) by mouth daily. 02/17/14  Yes Concha Norway, MD  ondansetron (ZOFRAN) 8 MG tablet Take 1 tablet (8 mg total) by mouth 2 (two) times daily. Start the day after chemo for 3 days. Then take as needed for nausea or vomiting. 05/05/15  Yes Truitt Merle, MD  oxyCODONE (OXY IR/ROXICODONE) 5 MG immediate release tablet Take 1-2 tablets (5-10 mg total) by mouth every 4 (four) hours as needed for moderate pain, severe pain or breakthrough pain. 04/14/15  Yes Jackolyn Confer, MD   BP 105/59 mmHg  Pulse 82   Temp(Src) 98 F (36.7 C) (Oral)  Resp 18  SpO2 96% Physical Exam  Constitutional: She is oriented to person, place, and time. She appears well-developed and well-nourished. No distress.  HENT:  Head: Normocephalic and atraumatic.  Eyes: EOM are normal. Pupils are equal, round, and reactive to light.  Neck: Normal range of motion. Neck supple.  Cardiovascular: Normal rate and regular rhythm.  Exam reveals no gallop and no friction rub.   No murmur heard. Pulmonary/Chest: Effort normal. She has no wheezes. She has no rales.  Abdominal: Soft. She exhibits no distension. There is no tenderness. There is no rebound and no guarding.  Musculoskeletal: She exhibits no edema or tenderness.  Neurological: She is alert and oriented to person, place, and time.  Skin: Skin is warm and dry. She is not diaphoretic.  Psychiatric: She has a normal mood and affect. Her behavior is normal.  Nursing note and vitals reviewed.   ED Course  Procedures (including critical care time) Labs Review Labs Reviewed  BASIC METABOLIC PANEL - Abnormal; Notable for the following:    Potassium 3.0 (*)    Chloride 100 (*)    Glucose, Bld 117 (*)    BUN 27 (*)    Creatinine, Ser 1.14 (*)    Calcium 8.6 (*)    GFR calc non Af Amer 50 (*)    GFR calc Af Amer 58 (*)    All other components within normal limits  CBC - Abnormal; Notable for the following:    Platelets 60 (*)    All other components within normal limits  URINALYSIS, ROUTINE W REFLEX MICROSCOPIC (NOT AT [facility]) - Abnormal; Notable for the following:    Specific Gravity, Urine 1.042 (*)    Nitrite POSITIVE (*)    Leukocytes, UA SMALL (*)    All other components within normal limits  URINE MICROSCOPIC-ADD ON - Abnormal; Notable for the following:    Squamous Epithelial / LPF FEW (*)    Bacteria, UA MANY (*)    All other components within normal limits  I-STAT CG4 LACTIC ACID, ED - Abnormal; Notable for the following:    Lactic Acid, Venous 2.45 (*)     All other components within normal limits  I-STAT CG4 LACTIC ACID, ED - Abnormal; Notable for the following:    Lactic Acid, Venous 2.90 (*)    All other components within normal limits  CULTURE, BLOOD (ROUTINE X 2)  CULTURE, BLOOD (ROUTINE X 2)  MRSA PCR SCREENING  URINE CULTURE  MAGNESIUM  MAGNESIUM  I-STAT TROPOININ, ED    Imaging Review Ct Head Wo Contrast  05/26/2015  CLINICAL DATA:  Back pain and lightheaded. Syncopal episode 1-2 hours ago. Chemotherapy 5 days ago for breast cancer. Also history of colon cancer. Brain cancer. IV contrast 3 hours ago for CTA chest. EXAM: CT HEAD WITHOUT CONTRAST TECHNIQUE: Contiguous axial images were obtained from the base of the skull through the vertex without intravenous contrast. COMPARISON:  MRI brain 03/13/2015 and CT 03/22/2011 FINDINGS: Ventricles, cisterns and other CSF spaces are within normal. Previous left parietal craniotomy. Postsurgical change/ encephalomalacia over the high left parietal region. Subtle chronic ischemic microvascular disease. No evidence of focal mass, mass effect, shift of midline structures or acute hemorrhage. No evidence of acute infarction. Remainder the exam is within normal. IMPRESSION: No acute intracranial findings. Previous left parietal craniotomy and stable post treatment changes of the left parietal region. Minimal chronic ischemic microvascular disease. Electronically Signed   By: Marin Olp M.D.   On: 05/26/2015 17:11   Ct Angio Chest Pe W/cm &/or Wo Cm  05/26/2015  CLINICAL DATA:  Lightheadedness and back pain with syncopal episode 1.5 hours ago. Chemotherapy treatment 5 days ago for breast cancer. History of colon cancer. EXAM: CT ANGIOGRAPHY CHEST WITH CONTRAST TECHNIQUE: Multidetector CT imaging of the chest was performed using the standard protocol during bolus administration of intravenous contrast. Multiplanar CT image reconstructions and MIPs were obtained to evaluate the vascular anatomy. CONTRAST:   122m OMNIPAQUE IOHEXOL 350 MG/ML SOLN. Scan had to be repeated due to scanner error during first injection. COMPARISON:  02/02/2015 FINDINGS: Right IJ Port-A-Cath with tip over the SVC just above the  cavoatrial junction. Lungs are adequately inflated with minimal bibasilar dependent atelectasis. No evidence of effusion. Airways are normal. Again noted is a mild to moderate pericardial effusion slightly worse compared to the prior exam. There is no evidence of pulmonary emboli. Minimal calcified plaque over the thoracic aorta. No evidence of mediastinal, hilar or axillary adenopathy. Postsurgical change over the medial upper right breast compatible prior lumpectomy. Seroma over the lumpectomy site measuring 1.8 x 4.1 cm. Images through the upper abdomen are within normal. There are degenerative changes of the spine. Review of the MIP images confirms the above findings. IMPRESSION: No evidence pulmonary embolism.  No acute pulmonary disease. Slight interval progression of small to moderate pericardial effusion. Postsurgical change compatible previous lumpectomy of the upper inner right breast. Right IJ Port-A-Cath unchanged. Electronically Signed   By: Marin Olp M.D.   On: 05/26/2015 14:37   Ct Lumbar Spine Wo Contrast  05/26/2015  CLINICAL DATA:  Back pain.  Lightheadedness. EXAM: CT LUMBAR SPINE WITHOUT CONTRAST TECHNIQUE: Multidetector CT imaging of the lumbar spine was performed without intravenous contrast administration. Multiplanar CT image reconstructions were also generated. COMPARISON:  CT abdomen and pelvis 02/02/2015. CT chest performed concurrently, reported separately. FINDINGS: Anatomic alignment. Transitional anatomy with partial sacralization L5 on the RIGHT. Intervertebral disc spaces are preserved in height. Dilated collecting systems and ureters appear increased from prior CT abdomen and pelvis from July 2016. It is possibly secondary to known large fibroid uterus with mild degree of  obstruction, but recurrent colon cancer not excluded. Consider CT abdomen and pelvis with contrast for further surveillance. No definite compressive lesion at L1-2, L2-3, or L3-4. At L4-5 there is a central and leftward disc extrusion with posterior element hypertrophy. Moderate stenosis is suspected. BILATERAL L4 and L5 nerve root impingement likely. At L5-S1 there is a moderate-sized calcified disc protrusion/disc osteophyte complex centrally. No definite subarticular zone or foraminal zone narrowing. IMPRESSION: Transitional anatomy.  L5 is partially sacralized on the RIGHT. Altered biomechanics secondary to transitional anatomy, leads to significant the disc pathology and posterior element disease at L4-5 where there is a central and leftward extrusion, with advanced posterior element hypertrophy and moderate stenosis. BILATERAL L4 and L5 nerve root impingement are likely. Prominent caliectasis and ureterectasis, increased from priors, greater on the RIGHT. Significance uncertain. Consider CT abdomen pelvis for further evaluation. Electronically Signed   By: Staci Righter M.D.   On: 05/26/2015 14:56   Ct Renal Stone Study  05/26/2015  CLINICAL DATA:  Pt states that her back hurts and is lightheaded. Family states pt had syncopal episode about 1.5 hours ago. Pt last chemo tx on last Thursday Nov. 3, h/o colon ca, brain ca, left flank >right flank pain, h/o diverticulitis. EXAM: CT ABDOMEN AND PELVIS WITHOUT CONTRAST TECHNIQUE: Multidetector CT imaging of the abdomen and pelvis was performed following the standard protocol without IV contrast. COMPARISON:  02/02/2015 FINDINGS: Lower chest: Pericardial effusion is present measuring 1.5 cm maximum depth on the images provided. No pulmonary nodules, pleural effusions, or infiltrates. Upper abdomen: There is residual contrast in the collecting systems following CT of the chest earlier today. There is residual contrast in the renal collecting systems. Kidneys  otherwise have a normal appearance. No focal abnormality identified within the liver, spleen, or adrenal glands. There is stranding in the peripancreatic region. No focal lesions are identified within the pancreas. No pseudocyst or abscess identified. The gallbladder is present and contains layering debris, stones, or contrast. Gastrointestinal tract: The stomach is normal in  appearance. Anastomosis of small bowel is identified in the right upper quadrant, unchanged in appearance. Patient has had previous partial colonic resection with anastomosis identified in the left mid abdomen at the level of the descending colon. Within this region there is focal distension with stool but no evidence for obstruction at this level. The appendix is well seen and has a normal appearance. Pelvis: The uterus is present appears somewhat nodular, containing numerous fibroids and calcifications. No adnexal mass. No free pelvic fluid. Retroperitoneum: There is mild atherosclerotic calcification of the abdominal aorta. No aneurysm. Stable appearance of right common iliac node measuring 8 mm. No new retroperitoneal adenopathy. Abdominal wall: Unremarkable. Osseous structures: Unremarkable. IMPRESSION: 1. 1.5 cm pericardial effusion. 2. Vicarious excretion within the gallbladder versus layering stones or sludge. 3. Stranding within the retroperitoneum and missed tarry raising the question of acute pancreatitis. No abscess or pseudocyst identified. 4. Bowel anastomoses unchanged in appearance. 5. No evidence for bowel obstruction. 6. Multiple uterine fibroids. 7. Atherosclerosis of the abdominal aorta. Electronically Signed   By: Nolon Nations M.D.   On: 05/26/2015 17:26   I have personally reviewed and evaluated these images and lab results as part of my medical decision-making.   EKG Interpretation   Date/Time:  Tuesday May 26 2015 12:17:58 EST Ventricular Rate:  81 PR Interval:  160 QRS Duration: 111 QT Interval:   409 QTC Calculation: 475 R Axis:   -51 Text Interpretation:  Sinus rhythm Ventricular premature complex Inferior  infarct, old Abnormal lateral Q waves Anterior infarct, old Baseline  wander Confirmed by Sydni Elizarraraz MD, DANIEL ([id number]) on 05/26/2015 3:18:59 PM      MDM   Final diagnoses:  Flank pain  ARF (acute renal failure) (HCC)  Syncope    64 yo F with a chief complaint of syncope. This happened on exertion. Patient endorsing some decreased fluid intake, due to nausea. We'll give fluid bolus. Concern for cardiac arrhythmia with exertional syncope. Patient also with increased risk for PE, with history of breast cancer. We'll obtain a CT scan of the chest, CT of the L-spine. Laboratory evaluation with mild AKI, mild hypo K.   Possible UTI on UA, will treat with rocephin.  CT chest negative for PE.  CT L spine with possible ureteral ectasis.  Patient symptoms with possible pyelo.  Recommended CT abd with contrast, however patient with AKI and contrast dye load already this visit.  CT stone study ordered. Discussed syncope with Dr. Ellyn Hack, cardiology, ok with admit here will see in AM.   The patients results and plan were reviewed and discussed.   Any x-rays performed were independently reviewed by myself.   Differential diagnosis were considered with the presenting HPI.  Medications  fluticasone (FLONASE) 50 MCG/ACT nasal spray 1 spray (not administered)  gabapentin (NEURONTIN) capsule 300 mg (not administered)  oxyCODONE (Oxy IR/ROXICODONE) immediate release tablet 5-10 mg (not administered)  potassium chloride SA (K-DUR,KLOR-CON) CR tablet 40 mEq (not administered)  sodium chloride 0.9 % bolus 1,000 mL (not administered)  sodium chloride 0.9 % bolus 1,000 mL (1,000 mLs Intravenous Transfusing/Transfer 05/26/15 1635)  iohexol (OMNIPAQUE) 350 MG/ML injection 100 mL (180 mLs Intravenous Contrast Given 05/26/15 1423)  cefTRIAXone (ROCEPHIN) 1 g in dextrose 5 % 50 mL IVPB (1 g Intravenous  Transfusing/Transfer 05/26/15 1635)  morphine 4 MG/ML injection 4 mg (4 mg Intravenous Given 05/26/15 1547)  ondansetron (ZOFRAN) injection 4 mg (4 mg Intravenous Given 05/26/15 1547)    Filed Vitals:   05/26/15 1201 05/26/15 1424  BP: 100/56 105/59  Pulse: 88 82  Temp: 97.4 F (36.3 C) 98 F (36.7 C)  TempSrc: Oral Oral  Resp: 20 18  SpO2: 95% 96%    Final diagnoses:  Flank pain  ARF (acute renal failure) (HCC)  Syncope    Admission/ observation were discussed with the admitting physician, patient and/or family and they are comfortable with the plan.    Deno Etienne, DO 05/26/15 1736

## 2015-05-27 ENCOUNTER — Other Ambulatory Visit: Payer: Self-pay

## 2015-05-27 ENCOUNTER — Inpatient Hospital Stay (HOSPITAL_COMMUNITY): Payer: Medicare HMO

## 2015-05-27 DIAGNOSIS — R55 Syncope and collapse: Secondary | ICD-10-CM

## 2015-05-27 DIAGNOSIS — N179 Acute kidney failure, unspecified: Secondary | ICD-10-CM | POA: Diagnosis present

## 2015-05-27 DIAGNOSIS — C7931 Secondary malignant neoplasm of brain: Secondary | ICD-10-CM

## 2015-05-27 DIAGNOSIS — E876 Hypokalemia: Secondary | ICD-10-CM | POA: Diagnosis present

## 2015-05-27 DIAGNOSIS — C50211 Malignant neoplasm of upper-inner quadrant of right female breast: Secondary | ICD-10-CM

## 2015-05-27 DIAGNOSIS — N390 Urinary tract infection, site not specified: Principal | ICD-10-CM

## 2015-05-27 DIAGNOSIS — I319 Disease of pericardium, unspecified: Secondary | ICD-10-CM

## 2015-05-27 DIAGNOSIS — C7949 Secondary malignant neoplasm of other parts of nervous system: Secondary | ICD-10-CM

## 2015-05-27 LAB — CBC
HEMATOCRIT: 35.7 % — AB (ref 36.0–46.0)
HEMOGLOBIN: 11.6 g/dL — AB (ref 12.0–15.0)
MCH: 27.7 pg (ref 26.0–34.0)
MCHC: 32.5 g/dL (ref 30.0–36.0)
MCV: 85.2 fL (ref 78.0–100.0)
Platelets: 48 10*3/uL — ABNORMAL LOW (ref 150–400)
RBC: 4.19 MIL/uL (ref 3.87–5.11)
RDW: 14.4 % (ref 11.5–15.5)
WBC: 3.4 10*3/uL — AB (ref 4.0–10.5)

## 2015-05-27 LAB — PROTIME-INR
INR: 1.07 (ref 0.00–1.49)
Prothrombin Time: 14.1 seconds (ref 11.6–15.2)

## 2015-05-27 LAB — COMPREHENSIVE METABOLIC PANEL
ALT: 22 U/L (ref 14–54)
ANION GAP: 4 — AB (ref 5–15)
AST: 17 U/L (ref 15–41)
Albumin: 2.8 g/dL — ABNORMAL LOW (ref 3.5–5.0)
Alkaline Phosphatase: 62 U/L (ref 38–126)
BILIRUBIN TOTAL: 1.4 mg/dL — AB (ref 0.3–1.2)
BUN: 17 mg/dL (ref 6–20)
CO2: 26 mmol/L (ref 22–32)
Calcium: 8.1 mg/dL — ABNORMAL LOW (ref 8.9–10.3)
Chloride: 109 mmol/L (ref 101–111)
Creatinine, Ser: 0.8 mg/dL (ref 0.44–1.00)
Glucose, Bld: 97 mg/dL (ref 65–99)
POTASSIUM: 4.2 mmol/L (ref 3.5–5.1)
Sodium: 139 mmol/L (ref 135–145)
TOTAL PROTEIN: 5.4 g/dL — AB (ref 6.5–8.1)

## 2015-05-27 LAB — LIPASE, BLOOD: LIPASE: 33 U/L (ref 11–51)

## 2015-05-27 LAB — MAGNESIUM: Magnesium: 2.1 mg/dL (ref 1.7–2.4)

## 2015-05-27 NOTE — Progress Notes (Signed)
This RN notified by [employer] that patient showed a 5 beat run of V-Tach at 1427. Patient asymptomatic. VS stable- 98.8, 88, 16, 143/73, 99% RA. MD made aware- ordered 12-lead EKG. Will continue to monitor.

## 2015-05-27 NOTE — Care Management Note (Signed)
Case Management Note  Patient Details  Name: Kayla [name] MRN: [id number] Date of Birth: [birth year]  Subjective/Objective:  64 y/o f admitted w/syncope,uti. From home.QP:RFFMB ca. New WG:YKZLDJ Ca.                  Action/Plan:d/c plan home   Expected Discharge Date:   (UNKNOWN)               Expected Discharge Plan:  Home/Self Care  In-House Referral:     Discharge planning Services  CM Consult  Post Acute Care Choice:    Choice offered to:     DME Arranged:    DME Agency:     HH Arranged:    HH Agency:     Status of Service:  In process, will continue to follow  Medicare Important Message Given:    Date Medicare IM Given:    Medicare IM give by:    Date Additional Medicare IM Given:    Additional Medicare Important Message give by:     If discussed at [place] of Stay Meetings, dates discussed:    Additional Comments:  Dessa Phi, RN 05/27/2015, 3:41 PM

## 2015-05-27 NOTE — Progress Notes (Signed)
[facility] PROGRESS NOTE   Kayla [name] QMG:[id number] DOB: 30-Jan-1951 DOA: 05/26/2015 PCP: No PCP Per Patient  HPI/Subjective: Feels much better, denies any dizziness or lightheadedness.  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 Blood and urine cultures obtained, with his antibiotics according to culture results. On IV fluids continue. Has mild acute renal failure.  Syncope:  Likely from uti/sepsis/dehydration, CTA no PE,  positive for pericardial effusion. Echo done, results are pending. CT scan of the head showed no acute events, patient is on telemetry.   Pericardial effusion Hemodynamically stable, echo pending, no SOB or hypotension.   Acute renal failure, mild. Creatinine1.14 (baseline 0.8), from dehydration/uti. Renal US pending, ivf/abx, hold acei.  Hypokalemia: replace k, check mag  Lower back pain: musculoskelatal? No cva tenderness, Ct Renal stone study with contrast obtained from the ED, no report of renal or ureter abnormalities. CT lumbar spine obtained in the ED showed L4-5 disc extrusion and possible bilateral l4-l5 nerve root impingement. Currently pain free after pain meds in the ED, Bed rest, prn pain meds for now.   Prominent caliectasis and ureterectasis on CT chest : ct renal stone study as mentioned above, no renal or ureter abnormality reported, urine no significant rbc. Renal US pending.  Stage IIA Breast CA (right upper inner quadrant): newly diagnosed (02/2015), s/p docetaxel and cyclophosphamide on 11/3. Defer to oncology.  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  Code Status: Full Code Family Communication: Plan discussed with the patient. Disposition Plan: Remains inpatient Diet: Diet Heart Room service appropriate?: Yes; Fluid consistency:: Thin  Consultants:  None  Procedures:  Nopne  Antibiotics:  Rocephin   Objective: Filed Vitals:   05/27/15 1435  BP: 143/73  Pulse: 88  Temp: 98.8 F (37.1 C)  Resp: 16    Intake/Output Summary (Last 24 hours) at 05/27/15 1542 Last data filed at 05/27/15 1418  Gross per 24 hour  Intake 1881.25 ml  Output   2050 ml  Net -168.75 ml   Filed Weights   05/26/15 1735  Weight: 84.369 kg (186 lb)    Exam: General: Alert and awake, oriented x3, not in any acute distress. HEENT: anicteric sclera, pupils reactive to light and accommodation, EOMI CVS: S1-S2 clear, no murmur rubs or gallops Chest: clear to auscultation bilaterally, no wheezing, rales or rhonchi Abdomen: soft nontender, nondistended, normal bowel sounds, no organomegaly Extremities: no cyanosis, clubbing or edema noted bilaterally Neuro: Cranial nerves II-XII intact, no focal neurological deficits  Data Reviewed: Basic Metabolic Panel:  Recent Labs Lab 05/21/15 0838 05/26/15 1220 05/26/15 1820 05/27/15 0453  NA 139 137  --  139  K 4.1 3.0*  --  4.2  CL  --  100*  --  109  CO2 20* 30  --  26  GLUCOSE 167* 117*  --  97  BUN 17.5 27*  --  17  CREATININE 0.8 1.14*  --  0.80  CALCIUM 10.2 8.6*  --  8.1*  MG  --   --  2.2 2.1   Liver Function Tests:  Recent Labs Lab 05/21/15 0838 05/27/15 0453  AST  37* 17  ALT 39 22  ALKPHOS 120 62  BILITOT 0.58 1.4*  PROT 7.7 5.4*  ALBUMIN 3.9 2.8*    Recent Labs Lab 05/26/15 1820 05/27/15 0453  LIPASE 42 33   No results for input(s): AMMONIA in the last 168 hours. CBC:  Recent Labs Lab 05/21/15 0838 05/26/15 1220 05/27/15 0453  WBC 19.3* 8.1 3.4*  NEUTROABS 17.2*  --   --   HGB 14.2 13.8 11.6*  HCT 43.0 42.6 35.7*  MCV  85.5 85.4 85.2  PLT 243 60* 48*   Cardiac Enzymes:  Recent Labs Lab 05/26/15 1820  TROPONINI <0.03   BNP (last 3 results) No results for input(s): BNP in the last 8760 hours.  ProBNP (last 3 results) No results for input(s): PROBNP in the last 8760 hours.  CBG: No results for input(s): GLUCAP in the last 168 hours.  Micro Recent Results (from the past 240 hour(s))  Culture, Urine     Status: None (Preliminary result)   Collection Time: 05/26/15  2:18 PM  Result Value Ref Range Status   Specimen Description URINE, CLEAN CATCH  Final   Special Requests NONE  Final   Culture   Final    TOO YOUNG TO READ Performed at [facility]    Report Status PENDING  Incomplete  Blood culture (routine x 2)     Status: None (Preliminary result)   Collection Time: 05/26/15  4:14 PM  Result Value Ref Range Status   Specimen Description BLOOD LEFT WRIST  Final   Special Requests BOTTLES DRAWN AEROBIC ONLY [place]  Final   Culture   Final    NO GROWTH < 24 HOURS Performed at [facility]    Report Status PENDING  Incomplete  MRSA PCR Screening     Status: None   Collection Time: 05/26/15  5:55 PM  Result Value Ref Range Status   MRSA by PCR NEGATIVE NEGATIVE Final    Comment:        The GeneXpert MRSA Assay (FDA approved for NASAL specimens only), is one component of a comprehensive MRSA colonization surveillance program. It is not intended to diagnose MRSA infection nor to guide or monitor treatment for MRSA infections.   Blood culture (routine x 2)     Status: None (Preliminary result)   Collection Time: 05/26/15  6:20 PM  Result Value Ref Range Status   Specimen Description BLOOD LEFT ARM  Final   Special Requests BOTTLES DRAWN AEROBIC AND ANAEROBIC 5CC  Final   Culture   Final    NO GROWTH < 24 HOURS Performed at [facility]    Report Status PENDING  Incomplete     Studies: Ct Head Wo Contrast  05/26/2015  CLINICAL DATA:  Back pain and  lightheaded. Syncopal episode 1-2 hours ago. Chemotherapy 5 days ago for breast cancer. Also history of colon cancer. Brain cancer. IV contrast 3 hours ago for CTA chest. EXAM: CT HEAD WITHOUT CONTRAST TECHNIQUE: Contiguous axial images were obtained from the base of the skull through the vertex without intravenous contrast. COMPARISON:  MRI brain 03/13/2015 and CT 03/22/2011 FINDINGS: Ventricles, cisterns and other CSF spaces are within normal. Previous left parietal craniotomy. Postsurgical change/ encephalomalacia over the high left parietal region. Subtle chronic ischemic microvascular disease. No evidence of focal mass, mass effect, shift of midline structures or acute hemorrhage. No evidence of acute infarction. Remainder the exam is within normal. IMPRESSION: No acute intracranial findings. Previous left parietal craniotomy and stable  post treatment changes of the left parietal region. Minimal chronic ischemic microvascular disease. Electronically Signed   By: Marin Olp M.D.   On: 05/26/2015 17:11   Ct Angio Chest Pe W/cm &/or Wo Cm  05/26/2015  CLINICAL DATA:  Lightheadedness and back pain with syncopal episode 1.5 hours ago. Chemotherapy treatment 5 days ago for breast cancer. History of colon cancer. EXAM: CT ANGIOGRAPHY CHEST WITH CONTRAST TECHNIQUE: Multidetector CT imaging of the chest was performed using the standard protocol during bolus administration of intravenous contrast. Multiplanar CT image reconstructions and MIPs were obtained to evaluate the vascular anatomy. CONTRAST:  165m OMNIPAQUE IOHEXOL 350 MG/ML SOLN. Scan had to be repeated due to scanner error during first injection. COMPARISON:  02/02/2015 FINDINGS: Right IJ Port-A-Cath with tip over the SVC just above the cavoatrial junction. Lungs are adequately inflated with minimal bibasilar dependent atelectasis. No evidence of effusion. Airways are normal. Again noted is a mild to moderate pericardial effusion slightly worse compared  to the prior exam. There is no evidence of pulmonary emboli. Minimal calcified plaque over the thoracic aorta. No evidence of mediastinal, hilar or axillary adenopathy. Postsurgical change over the medial upper right breast compatible prior lumpectomy. Seroma over the lumpectomy site measuring 1.8 x 4.1 cm. Images through the upper abdomen are within normal. There are degenerative changes of the spine. Review of the MIP images confirms the above findings. IMPRESSION: No evidence pulmonary embolism.  No acute pulmonary disease. Slight interval progression of small to moderate pericardial effusion. Postsurgical change compatible previous lumpectomy of the upper inner right breast. Right IJ Port-A-Cath unchanged. Electronically Signed   By: DMarin OlpM.D.   On: 05/26/2015 14:37   Ct Lumbar Spine Wo Contrast  05/26/2015  CLINICAL DATA:  Back pain.  Lightheadedness. EXAM: CT LUMBAR SPINE WITHOUT CONTRAST TECHNIQUE: Multidetector CT imaging of the lumbar spine was performed without intravenous contrast administration. Multiplanar CT image reconstructions were also generated. COMPARISON:  CT abdomen and pelvis 02/02/2015. CT chest performed concurrently, reported separately. FINDINGS: Anatomic alignment. Transitional anatomy with partial sacralization L5 on the RIGHT. Intervertebral disc spaces are preserved in height. Dilated collecting systems and ureters appear increased from prior CT abdomen and pelvis from July 2016. It is possibly secondary to known large fibroid uterus with mild degree of obstruction, but recurrent colon cancer not excluded. Consider CT abdomen and pelvis with contrast for further surveillance. No definite compressive lesion at L1-2, L2-3, or L3-4. At L4-5 there is a central and leftward disc extrusion with posterior element hypertrophy. Moderate stenosis is suspected. BILATERAL L4 and L5 nerve root impingement likely. At L5-S1 there is a moderate-sized calcified disc protrusion/disc  osteophyte complex centrally. No definite subarticular zone or foraminal zone narrowing. IMPRESSION: Transitional anatomy.  L5 is partially sacralized on the RIGHT. Altered biomechanics secondary to transitional anatomy, leads to significant the disc pathology and posterior element disease at L4-5 where there is a central and leftward extrusion, with advanced posterior element hypertrophy and moderate stenosis. BILATERAL L4 and L5 nerve root impingement are likely. Prominent caliectasis and ureterectasis, increased from priors, greater on the RIGHT. Significance uncertain. Consider CT abdomen pelvis for further evaluation. Electronically Signed   By: JStaci RighterM.D.   On: 05/26/2015 14:56   [place]  05/26/2015  CLINICAL DATA:  Acute renal failure. EXAM: RENAL / URINARY TRACT ULTRASOUND COMPLETE COMPARISON:  CT earlier this day FINDINGS: Right Kidney: Length: 11.6 cm. Echogenicity within normal limits. No mass or hydronephrosis visualized. Left Kidney: Length: 11.0 cm.  Echogenicity within normal limits. No mass or hydronephrosis visualized. Bladder: Appears normal for degree of bladder distention. Both ureteral jets are seen. IMPRESSION: Normal renal ultrasound.  No obstructive uropathy. Electronically Signed   By: Jeb Levering M.D.   On: 05/26/2015 22:56   Ct Renal Stone Study  05/26/2015  CLINICAL DATA:  Pt states that her back hurts and is lightheaded. Family states pt had syncopal episode about 1.5 hours ago. Pt last chemo tx on last Thursday Nov. 3, h/o colon ca, brain ca, left flank >right flank pain, h/o diverticulitis. EXAM: CT ABDOMEN AND PELVIS WITHOUT CONTRAST TECHNIQUE: Multidetector CT imaging of the abdomen and pelvis was performed following the standard protocol without IV contrast. COMPARISON:  02/02/2015 FINDINGS: Lower chest: Pericardial effusion is present measuring 1.5 cm maximum depth on the images provided. No pulmonary nodules, pleural effusions, or infiltrates. Upper abdomen:  There is residual contrast in the collecting systems following CT of the chest earlier today. There is residual contrast in the renal collecting systems. Kidneys otherwise have a normal appearance. No focal abnormality identified within the liver, spleen, or adrenal glands. There is stranding in the peripancreatic region. No focal lesions are identified within the pancreas. No pseudocyst or abscess identified. The gallbladder is present and contains layering debris, stones, or contrast. Gastrointestinal tract: The stomach is normal in appearance. Anastomosis of small bowel is identified in the right upper quadrant, unchanged in appearance. Patient has had previous partial colonic resection with anastomosis identified in the left mid abdomen at the level of the descending colon. Within this region there is focal distension with stool but no evidence for obstruction at this level. The appendix is well seen and has a normal appearance. Pelvis: The uterus is present appears somewhat nodular, containing numerous fibroids and calcifications. No adnexal mass. No free pelvic fluid. Retroperitoneum: There is mild atherosclerotic calcification of the abdominal aorta. No aneurysm. Stable appearance of right common iliac node measuring 8 mm. No new retroperitoneal adenopathy. Abdominal wall: Unremarkable. Osseous structures: Unremarkable. IMPRESSION: 1. 1.5 cm pericardial effusion. 2. Vicarious excretion within the gallbladder versus layering stones or sludge. 3. Stranding within the retroperitoneum and missed tarry raising the question of acute pancreatitis. No abscess or pseudocyst identified. 4. Bowel anastomoses unchanged in appearance. 5. No evidence for bowel obstruction. 6. Multiple uterine fibroids. 7. Atherosclerosis of the abdominal aorta. Electronically Signed   By: Nolon Nations M.D.   On: 05/26/2015 17:26    Scheduled Meds: . cefTRIAXone (ROCEPHIN)  IV  2 g Intravenous Q24H  . gabapentin  300 mg Oral QHS    . sodium chloride  3 mL Intravenous Q12H   Continuous Infusions: . 0.9 % NaCl with KCl 20 mEq / L 75 mL/hr at 05/27/15 1023       Time spent: 35 minutes    [facility]  [facility] Pager [phone] If 7PM-7AM, please contact night-coverage at [email or web address], password [facility] 05/27/2015, 3:42 PM  LOS: 1 day

## 2015-05-27 NOTE — Progress Notes (Signed)
Echocardiogram 2D Echocardiogram has been performed.  Kayla [name] 05/27/2015, 9:49 AM

## 2015-05-27 NOTE — Discharge Summary (Deleted)
[facility] PROGRESS NOTE   ONDA KATTNER MVH:[id number] DOB: 04/23/1951 DOA: 05/26/2015 PCP: No PCP Per Patient  HPI/Subjective: Feels much better, denies any dizziness or lightheadedness.  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 Blood and urine cultures obtained, with his antibiotics according to culture results. On IV fluids continue. Has mild acute renal failure.  Syncope:  Likely from uti/sepsis/dehydration, CTA no PE,  positive for pericardial effusion. Echo done, results are pending. CT scan of the head showed no acute events, patient is on telemetry.   Pericardial effusion Hemodynamically stable, echo pending, no SOB or hypotension.   Acute renal failure, mild. Creatinine1.14 (baseline 0.8), from dehydration/uti. Renal US pending, ivf/abx, hold acei.  Hypokalemia: replace k, check mag  Lower back pain: musculoskelatal? No cva tenderness, Ct Renal stone study with contrast obtained from the ED, no report of renal or ureter abnormalities. CT lumbar spine obtained in the ED showed L4-5 disc extrusion and possible bilateral l4-l5 nerve root impingement. Currently pain free after pain meds in the ED, Bed rest, prn pain meds for now.   Prominent caliectasis and ureterectasis on CT chest : ct renal stone study as mentioned above, no renal or ureter abnormality reported, urine no significant rbc. Renal US pending.  Stage IIA Breast CA (right upper inner quadrant): newly diagnosed (02/2015), s/p docetaxel and cyclophosphamide on 11/3. Defer to oncology.  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  Code Status: Full Code Family Communication: Plan discussed with the patient. Disposition Plan: Remains inpatient Diet: Diet Heart Room service appropriate?: Yes; Fluid consistency:: Thin  Consultants:  None  Procedures:  Nopne  Antibiotics:  Rocephin   Objective: Filed Vitals:   05/27/15 0455  BP: 115/64  Pulse: 74  Temp: 98.7 F (37.1 C)  Resp: 18    Intake/Output Summary (Last 24 hours) at 05/27/15 1253 Last data filed at 05/27/15 0810  Gross per 24 hour  Intake 853.75 ml  Output   2050 ml  Net -1196.25 ml   Filed Weights   05/26/15 1735  Weight: 84.369 kg (186 lb)    Exam: General: Alert and awake, oriented x3, not in any acute distress. HEENT: anicteric sclera, pupils reactive to light and accommodation, EOMI CVS: S1-S2 clear, no murmur rubs or gallops Chest: clear to auscultation bilaterally, no wheezing, rales or rhonchi Abdomen: soft nontender, nondistended, normal bowel sounds, no organomegaly Extremities: no cyanosis, clubbing or edema noted bilaterally Neuro: Cranial nerves II-XII intact, no focal neurological deficits  Data Reviewed: Basic Metabolic Panel:  Recent Labs Lab 05/21/15 0838 05/26/15 1220 05/26/15 1820 05/27/15 0453  NA 139 137  --  139  K 4.1 3.0*  --  4.2  CL  --  100*  --  109  CO2 20* 30  --  26  GLUCOSE 167* 117*  --  97  BUN 17.5 27*  --  17  CREATININE 0.8 1.14*  --  0.80  CALCIUM 10.2 8.6*  --  8.1*  MG  --   --  2.2 2.1   Liver Function Tests:  Recent Labs Lab 05/21/15 0838 05/27/15 0453  AST  37* 17  ALT 39 22  ALKPHOS 120 62  BILITOT 0.58 1.4*  PROT 7.7 5.4*  ALBUMIN 3.9 2.8*    Recent Labs Lab 05/26/15 1820 05/27/15 0453  LIPASE 42 33   No results for input(s): AMMONIA in the last 168 hours. CBC:  Recent Labs Lab 05/21/15 0838 05/26/15 1220 05/27/15 0453  WBC 19.3* 8.1 3.4*  NEUTROABS 17.2*  --   --   HGB 14.2 13.8 11.6*  HCT 43.0 42.6 35.7*  MCV  85.5 85.4 85.2  PLT 243 60* 48*   Cardiac Enzymes:  Recent Labs Lab 05/26/15 1820  TROPONINI <0.03   BNP (last 3 results) No results for input(s): BNP in the last 8760 hours.  ProBNP (last 3 results) No results for input(s): PROBNP in the last 8760 hours.  CBG: No results for input(s): GLUCAP in the last 168 hours.  Micro Recent Results (from the past 240 hour(s))  Culture, Urine     Status: None (Preliminary result)   Collection Time: 05/26/15  2:18 PM  Result Value Ref Range Status   Specimen Description URINE, CLEAN CATCH  Final   Special Requests NONE  Final   Culture   Final    TOO YOUNG TO READ Performed at [facility]    Report Status PENDING  Incomplete  Blood culture (routine x 2)     Status: None (Preliminary result)   Collection Time: 05/26/15  4:14 PM  Result Value Ref Range Status   Specimen Description BLOOD LEFT WRIST  Final   Special Requests BOTTLES DRAWN AEROBIC ONLY [place]  Final   Culture   Final    NO GROWTH < 24 HOURS Performed at [facility]    Report Status PENDING  Incomplete  MRSA PCR Screening     Status: None   Collection Time: 05/26/15  5:55 PM  Result Value Ref Range Status   MRSA by PCR NEGATIVE NEGATIVE Final    Comment:        The GeneXpert MRSA Assay (FDA approved for NASAL specimens only), is one component of a comprehensive MRSA colonization surveillance program. It is not intended to diagnose MRSA infection nor to guide or monitor treatment for MRSA infections.   Blood culture (routine x 2)     Status: None (Preliminary result)   Collection Time: 05/26/15  6:20 PM  Result Value Ref Range Status   Specimen Description BLOOD LEFT ARM  Final   Special Requests BOTTLES DRAWN AEROBIC AND ANAEROBIC 5CC  Final   Culture   Final    NO GROWTH < 24 HOURS Performed at [facility]    Report Status PENDING  Incomplete     Studies: Ct Head Wo Contrast  05/26/2015  CLINICAL DATA:  Back pain and  lightheaded. Syncopal episode 1-2 hours ago. Chemotherapy 5 days ago for breast cancer. Also history of colon cancer. Brain cancer. IV contrast 3 hours ago for CTA chest. EXAM: CT HEAD WITHOUT CONTRAST TECHNIQUE: Contiguous axial images were obtained from the base of the skull through the vertex without intravenous contrast. COMPARISON:  MRI brain 03/13/2015 and CT 03/22/2011 FINDINGS: Ventricles, cisterns and other CSF spaces are within normal. Previous left parietal craniotomy. Postsurgical change/ encephalomalacia over the high left parietal region. Subtle chronic ischemic microvascular disease. No evidence of focal mass, mass effect, shift of midline structures or acute hemorrhage. No evidence of acute infarction. Remainder the exam is within normal. IMPRESSION: No acute intracranial findings. Previous left parietal craniotomy and stable  post treatment changes of the left parietal region. Minimal chronic ischemic microvascular disease. Electronically Signed   By: Marin Olp M.D.   On: 05/26/2015 17:11   Ct Angio Chest Pe W/cm &/or Wo Cm  05/26/2015  CLINICAL DATA:  Lightheadedness and back pain with syncopal episode 1.5 hours ago. Chemotherapy treatment 5 days ago for breast cancer. History of colon cancer. EXAM: CT ANGIOGRAPHY CHEST WITH CONTRAST TECHNIQUE: Multidetector CT imaging of the chest was performed using the standard protocol during bolus administration of intravenous contrast. Multiplanar CT image reconstructions and MIPs were obtained to evaluate the vascular anatomy. CONTRAST:  158m OMNIPAQUE IOHEXOL 350 MG/ML SOLN. Scan had to be repeated due to scanner error during first injection. COMPARISON:  02/02/2015 FINDINGS: Right IJ Port-A-Cath with tip over the SVC just above the cavoatrial junction. Lungs are adequately inflated with minimal bibasilar dependent atelectasis. No evidence of effusion. Airways are normal. Again noted is a mild to moderate pericardial effusion slightly worse compared  to the prior exam. There is no evidence of pulmonary emboli. Minimal calcified plaque over the thoracic aorta. No evidence of mediastinal, hilar or axillary adenopathy. Postsurgical change over the medial upper right breast compatible prior lumpectomy. Seroma over the lumpectomy site measuring 1.8 x 4.1 cm. Images through the upper abdomen are within normal. There are degenerative changes of the spine. Review of the MIP images confirms the above findings. IMPRESSION: No evidence pulmonary embolism.  No acute pulmonary disease. Slight interval progression of small to moderate pericardial effusion. Postsurgical change compatible previous lumpectomy of the upper inner right breast. Right IJ Port-A-Cath unchanged. Electronically Signed   By: DMarin OlpM.D.   On: 05/26/2015 14:37   Ct Lumbar Spine Wo Contrast  05/26/2015  CLINICAL DATA:  Back pain.  Lightheadedness. EXAM: CT LUMBAR SPINE WITHOUT CONTRAST TECHNIQUE: Multidetector CT imaging of the lumbar spine was performed without intravenous contrast administration. Multiplanar CT image reconstructions were also generated. COMPARISON:  CT abdomen and pelvis 02/02/2015. CT chest performed concurrently, reported separately. FINDINGS: Anatomic alignment. Transitional anatomy with partial sacralization L5 on the RIGHT. Intervertebral disc spaces are preserved in height. Dilated collecting systems and ureters appear increased from prior CT abdomen and pelvis from July 2016. It is possibly secondary to known large fibroid uterus with mild degree of obstruction, but recurrent colon cancer not excluded. Consider CT abdomen and pelvis with contrast for further surveillance. No definite compressive lesion at L1-2, L2-3, or L3-4. At L4-5 there is a central and leftward disc extrusion with posterior element hypertrophy. Moderate stenosis is suspected. BILATERAL L4 and L5 nerve root impingement likely. At L5-S1 there is a moderate-sized calcified disc protrusion/disc  osteophyte complex centrally. No definite subarticular zone or foraminal zone narrowing. IMPRESSION: Transitional anatomy.  L5 is partially sacralized on the RIGHT. Altered biomechanics secondary to transitional anatomy, leads to significant the disc pathology and posterior element disease at L4-5 where there is a central and leftward extrusion, with advanced posterior element hypertrophy and moderate stenosis. BILATERAL L4 and L5 nerve root impingement are likely. Prominent caliectasis and ureterectasis, increased from priors, greater on the RIGHT. Significance uncertain. Consider CT abdomen pelvis for further evaluation. Electronically Signed   By: JStaci RighterM.D.   On: 05/26/2015 14:56   [place]  05/26/2015  CLINICAL DATA:  Acute renal failure. EXAM: RENAL / URINARY TRACT ULTRASOUND COMPLETE COMPARISON:  CT earlier this day FINDINGS: Right Kidney: Length: 11.6 cm. Echogenicity within normal limits. No mass or hydronephrosis visualized. Left Kidney: Length: 11.0 cm.  Echogenicity within normal limits. No mass or hydronephrosis visualized. Bladder: Appears normal for degree of bladder distention. Both ureteral jets are seen. IMPRESSION: Normal renal ultrasound.  No obstructive uropathy. Electronically Signed   By: Jeb Levering M.D.   On: 05/26/2015 22:56   Ct Renal Stone Study  05/26/2015  CLINICAL DATA:  Pt states that her back hurts and is lightheaded. Family states pt had syncopal episode about 1.5 hours ago. Pt last chemo tx on last Thursday Nov. 3, h/o colon ca, brain ca, left flank >right flank pain, h/o diverticulitis. EXAM: CT ABDOMEN AND PELVIS WITHOUT CONTRAST TECHNIQUE: Multidetector CT imaging of the abdomen and pelvis was performed following the standard protocol without IV contrast. COMPARISON:  02/02/2015 FINDINGS: Lower chest: Pericardial effusion is present measuring 1.5 cm maximum depth on the images provided. No pulmonary nodules, pleural effusions, or infiltrates. Upper abdomen:  There is residual contrast in the collecting systems following CT of the chest earlier today. There is residual contrast in the renal collecting systems. Kidneys otherwise have a normal appearance. No focal abnormality identified within the liver, spleen, or adrenal glands. There is stranding in the peripancreatic region. No focal lesions are identified within the pancreas. No pseudocyst or abscess identified. The gallbladder is present and contains layering debris, stones, or contrast. Gastrointestinal tract: The stomach is normal in appearance. Anastomosis of small bowel is identified in the right upper quadrant, unchanged in appearance. Patient has had previous partial colonic resection with anastomosis identified in the left mid abdomen at the level of the descending colon. Within this region there is focal distension with stool but no evidence for obstruction at this level. The appendix is well seen and has a normal appearance. Pelvis: The uterus is present appears somewhat nodular, containing numerous fibroids and calcifications. No adnexal mass. No free pelvic fluid. Retroperitoneum: There is mild atherosclerotic calcification of the abdominal aorta. No aneurysm. Stable appearance of right common iliac node measuring 8 mm. No new retroperitoneal adenopathy. Abdominal wall: Unremarkable. Osseous structures: Unremarkable. IMPRESSION: 1. 1.5 cm pericardial effusion. 2. Vicarious excretion within the gallbladder versus layering stones or sludge. 3. Stranding within the retroperitoneum and missed tarry raising the question of acute pancreatitis. No abscess or pseudocyst identified. 4. Bowel anastomoses unchanged in appearance. 5. No evidence for bowel obstruction. 6. Multiple uterine fibroids. 7. Atherosclerosis of the abdominal aorta. Electronically Signed   By: Nolon Nations M.D.   On: 05/26/2015 17:26    Scheduled Meds: . cefTRIAXone (ROCEPHIN)  IV  2 g Intravenous Q24H  . gabapentin  300 mg Oral QHS    . sodium chloride  3 mL Intravenous Q12H   Continuous Infusions: . 0.9 % NaCl with KCl 20 mEq / L 75 mL/hr at 05/27/15 1023       Time spent: 35 minutes    [facility]  [facility] Pager [phone] If 7PM-7AM, please contact night-coverage at [email or web address], password [facility] 05/27/2015, 12:53 PM  LOS: 1 day

## 2015-05-28 MED ORDER — DEXTROSE 5 % IV SOLN
2.0000 g | INTRAVENOUS | Status: DC
  Administered 2015-05-28 – 2015-05-29 (×2): 2 g via INTRAVENOUS
  Filled 2015-05-28 (×2): qty 2

## 2015-05-28 NOTE — Progress Notes (Signed)
[facility] PROGRESS NOTE   Kayla [name] QJJ:[id number] DOB: Apr 02, 1951 DOA: 05/26/2015 PCP: No PCP Per Patient  HPI/Subjective: Continue to improve, no fever or chills. Urine showed GNR but no identification, continue current antibiotics.  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 Blood and urine cultures obtained, with his antibiotics according to culture results. Urine culture showed GNR but now identification and susceptibility continue IV Rocephin.  Syncope:  Likely from uti/sepsis/dehydration, CTA no PE,  positive for pericardial effusion. Echo done, results are pending. CT scan of the head showed no acute events, patient is on telemetry.   Pericardial effusion Hemodynamically stable, echo pending, no SOB or hypotension.   Acute renal failure, mild. Creatinine1.14 (baseline 0.8), from dehydration/uti.  This is resolved with IV fluids creatinine today 0.8.   Hypokalemia: replace k, check mag  Lower back pain: musculoskelatal? No cva tenderness, Ct Renal stone study with contrast obtained from the ED, no report of renal or ureter abnormalities. CT lumbar spine obtained in the ED showed L4-5 disc extrusion and possible bilateral l4-l5 nerve root impingement. Currently pain free after pain meds in the ED, Bed rest, prn pain meds for now.   Prominent caliectasis and ureterectasis on CT chest : ct renal stone study as mentioned above, no renal or ureter abnormality reported, urine no significant rbc. Renal US pending.  Stage IIA Breast CA (right upper inner quadrant): newly  diagnosed (02/2015), s/p docetaxel and cyclophosphamide on 11/3. Defer to oncology.  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  Code Status: Full Code Family Communication: Plan discussed with the patient. Disposition Plan: Remains inpatient Diet: Diet Heart Room service appropriate?: Yes; Fluid consistency:: Thin  Consultants:  None  Procedures:  None  Antibiotics:  Rocephin   Objective: Filed Vitals:   05/28/15 0410  BP: 139/69  Pulse: 79  Temp: 98.4 F (36.9 C)  Resp: 16    Intake/Output Summary (Last 24 hours) at 05/28/15 1121 Last data filed at 05/28/15 0700  Gross per 24 hour  Intake   2090 ml  Output      0 ml  Net   2090 ml   Filed Weights   05/26/15 1735  Weight: 84.369 kg (186 lb)    Exam: General: Alert and awake, oriented x3, not in any acute distress. HEENT: anicteric sclera, pupils reactive to light and accommodation, EOMI CVS: S1-S2 clear, no murmur rubs or gallops Chest: clear to auscultation bilaterally, no wheezing, rales or rhonchi Abdomen: soft nontender, nondistended, normal bowel sounds, no organomegaly Extremities: no cyanosis, clubbing or edema noted bilaterally Neuro: Cranial nerves II-XII intact, no focal neurological deficits  Data Reviewed: Basic Metabolic Panel:  Recent Labs Lab 05/26/15 1220 05/26/15 1820 05/27/15 0453  NA 137  --  139  K 3.0*  --  4.2  CL 100*  --  109  CO2 30  --  26  GLUCOSE 117*  --  97  BUN 27*  --  17  CREATININE 1.14*  --  0.80  CALCIUM 8.6*  --  8.1*  MG  --  2.2 2.1   Liver Function Tests:  Recent  Labs Lab 05/27/15 0453  AST 17  ALT 22  ALKPHOS 62  BILITOT 1.4*  PROT 5.4*  ALBUMIN 2.8*    Recent Labs Lab 05/26/15 1820 05/27/15 0453  LIPASE 42 33   No results for input(s): AMMONIA in the last 168 hours. CBC:  Recent Labs Lab 05/26/15 1220 05/27/15 0453  WBC 8.1 3.4*  HGB 13.8 11.6*  HCT 42.6 35.7*  MCV 85.4 85.2  PLT 60* 48*   Cardiac  Enzymes:  Recent Labs Lab 05/26/15 1820  TROPONINI <0.03   BNP (last 3 results) No results for input(s): BNP in the last 8760 hours.  ProBNP (last 3 results) No results for input(s): PROBNP in the last 8760 hours.  CBG: No results for input(s): GLUCAP in the last 168 hours.  Micro Recent Results (from the past 240 hour(s))  Culture, Urine     Status: None (Preliminary result)   Collection Time: 05/26/15  2:18 PM  Result Value Ref Range Status   Specimen Description URINE, CLEAN CATCH  Final   Special Requests NONE  Final   Culture   Final    >=100,000 COLONIES/mL GRAM NEGATIVE RODS CULTURE REINCUBATED FOR BETTER GROWTH Performed at [facility]    Report Status PENDING  Incomplete  Blood culture (routine x 2)     Status: None (Preliminary result)   Collection Time: 05/26/15  4:14 PM  Result Value Ref Range Status   Specimen Description BLOOD LEFT WRIST  Final   Special Requests BOTTLES DRAWN AEROBIC ONLY [place]  Final   Culture   Final    NO GROWTH < 24 HOURS Performed at [facility]    Report Status PENDING  Incomplete  MRSA PCR Screening     Status: None   Collection Time: 05/26/15  5:55 PM  Result Value Ref Range Status   MRSA by PCR NEGATIVE NEGATIVE Final    Comment:        The GeneXpert MRSA Assay (FDA approved for NASAL specimens only), is one component of a comprehensive MRSA colonization surveillance program. It is not intended to diagnose MRSA infection nor to guide or monitor treatment for MRSA infections.   Blood culture (routine x 2)     Status: None (Preliminary result)   Collection Time: 05/26/15  6:20 PM  Result Value Ref Range Status   Specimen Description BLOOD LEFT ARM  Final   Special Requests BOTTLES DRAWN AEROBIC AND ANAEROBIC 5CC  Final   Culture   Final    NO GROWTH < 24 HOURS Performed at [facility]    Report Status PENDING  Incomplete     Studies: Ct Head Wo Contrast  05/26/2015  CLINICAL DATA:  Back  pain and lightheaded. Syncopal episode 1-2 hours ago. Chemotherapy 5 days ago for breast cancer. Also history of colon cancer. Brain cancer. IV contrast 3 hours ago for CTA chest. EXAM: CT HEAD WITHOUT CONTRAST TECHNIQUE: Contiguous axial images were obtained from the base of the skull through the vertex without intravenous contrast. COMPARISON:  MRI brain 03/13/2015 and CT 03/22/2011 FINDINGS: Ventricles, cisterns and other CSF spaces are within normal. Previous left parietal craniotomy. Postsurgical change/ encephalomalacia over the high left parietal region. Subtle chronic ischemic microvascular disease. No evidence of focal mass, mass effect, shift of midline structures or acute hemorrhage. No evidence of acute infarction. Remainder the exam is within normal. IMPRESSION: No acute intracranial findings. Previous left parietal craniotomy and stable post treatment changes of the left parietal region. Minimal chronic  ischemic microvascular disease. Electronically Signed   By: Marin Olp M.D.   On: 05/26/2015 17:11   Ct Angio Chest Pe W/cm &/or Wo Cm  05/26/2015  CLINICAL DATA:  Lightheadedness and back pain with syncopal episode 1.5 hours ago. Chemotherapy treatment 5 days ago for breast cancer. History of colon cancer. EXAM: CT ANGIOGRAPHY CHEST WITH CONTRAST TECHNIQUE: Multidetector CT imaging of the chest was performed using the standard protocol during bolus administration of intravenous contrast. Multiplanar CT image reconstructions and MIPs were obtained to evaluate the vascular anatomy. CONTRAST:  180m OMNIPAQUE IOHEXOL 350 MG/ML SOLN. Scan had to be repeated due to scanner error during first injection. COMPARISON:  02/02/2015 FINDINGS: Right IJ Port-A-Cath with tip over the SVC just above the cavoatrial junction. Lungs are adequately inflated with minimal bibasilar dependent atelectasis. No evidence of effusion. Airways are normal. Again noted is a mild to moderate pericardial effusion slightly worse  compared to the prior exam. There is no evidence of pulmonary emboli. Minimal calcified plaque over the thoracic aorta. No evidence of mediastinal, hilar or axillary adenopathy. Postsurgical change over the medial upper right breast compatible prior lumpectomy. Seroma over the lumpectomy site measuring 1.8 x 4.1 cm. Images through the upper abdomen are within normal. There are degenerative changes of the spine. Review of the MIP images confirms the above findings. IMPRESSION: No evidence pulmonary embolism.  No acute pulmonary disease. Slight interval progression of small to moderate pericardial effusion. Postsurgical change compatible previous lumpectomy of the upper inner right breast. Right IJ Port-A-Cath unchanged. Electronically Signed   By: DMarin OlpM.D.   On: 05/26/2015 14:37   Ct Lumbar Spine Wo Contrast  05/26/2015  CLINICAL DATA:  Back pain.  Lightheadedness. EXAM: CT LUMBAR SPINE WITHOUT CONTRAST TECHNIQUE: Multidetector CT imaging of the lumbar spine was performed without intravenous contrast administration. Multiplanar CT image reconstructions were also generated. COMPARISON:  CT abdomen and pelvis 02/02/2015. CT chest performed concurrently, reported separately. FINDINGS: Anatomic alignment. Transitional anatomy with partial sacralization L5 on the RIGHT. Intervertebral disc spaces are preserved in height. Dilated collecting systems and ureters appear increased from prior CT abdomen and pelvis from July 2016. It is possibly secondary to known large fibroid uterus with mild degree of obstruction, but recurrent colon cancer not excluded. Consider CT abdomen and pelvis with contrast for further surveillance. No definite compressive lesion at L1-2, L2-3, or L3-4. At L4-5 there is a central and leftward disc extrusion with posterior element hypertrophy. Moderate stenosis is suspected. BILATERAL L4 and L5 nerve root impingement likely. At L5-S1 there is a moderate-sized calcified disc protrusion/disc  osteophyte complex centrally. No definite subarticular zone or foraminal zone narrowing. IMPRESSION: Transitional anatomy.  L5 is partially sacralized on the RIGHT. Altered biomechanics secondary to transitional anatomy, leads to significant the disc pathology and posterior element disease at L4-5 where there is a central and leftward extrusion, with advanced posterior element hypertrophy and moderate stenosis. BILATERAL L4 and L5 nerve root impingement are likely. Prominent caliectasis and ureterectasis, increased from priors, greater on the RIGHT. Significance uncertain. Consider CT abdomen pelvis for further evaluation. Electronically Signed   By: JStaci RighterM.D.   On: 05/26/2015 14:56   [place]  05/26/2015  CLINICAL DATA:  Acute renal failure. EXAM: RENAL / URINARY TRACT ULTRASOUND COMPLETE COMPARISON:  CT earlier this day FINDINGS: Right Kidney: Length: 11.6 cm. Echogenicity within normal limits. No mass or hydronephrosis visualized. Left Kidney: Length: 11.0 cm. Echogenicity within normal limits. No mass or hydronephrosis visualized. Bladder:  Appears normal for degree of bladder distention. Both ureteral jets are seen. IMPRESSION: Normal renal ultrasound.  No obstructive uropathy. Electronically Signed   By: Jeb Levering M.D.   On: 05/26/2015 22:56   Ct Renal Stone Study  05/26/2015  CLINICAL DATA:  Pt states that her back hurts and is lightheaded. Family states pt had syncopal episode about 1.5 hours ago. Pt last chemo tx on last Thursday Nov. 3, h/o colon ca, brain ca, left flank >right flank pain, h/o diverticulitis. EXAM: CT ABDOMEN AND PELVIS WITHOUT CONTRAST TECHNIQUE: Multidetector CT imaging of the abdomen and pelvis was performed following the standard protocol without IV contrast. COMPARISON:  02/02/2015 FINDINGS: Lower chest: Pericardial effusion is present measuring 1.5 cm maximum depth on the images provided. No pulmonary nodules, pleural effusions, or infiltrates. Upper abdomen:  There is residual contrast in the collecting systems following CT of the chest earlier today. There is residual contrast in the renal collecting systems. Kidneys otherwise have a normal appearance. No focal abnormality identified within the liver, spleen, or adrenal glands. There is stranding in the peripancreatic region. No focal lesions are identified within the pancreas. No pseudocyst or abscess identified. The gallbladder is present and contains layering debris, stones, or contrast. Gastrointestinal tract: The stomach is normal in appearance. Anastomosis of small bowel is identified in the right upper quadrant, unchanged in appearance. Patient has had previous partial colonic resection with anastomosis identified in the left mid abdomen at the level of the descending colon. Within this region there is focal distension with stool but no evidence for obstruction at this level. The appendix is well seen and has a normal appearance. Pelvis: The uterus is present appears somewhat nodular, containing numerous fibroids and calcifications. No adnexal mass. No free pelvic fluid. Retroperitoneum: There is mild atherosclerotic calcification of the abdominal aorta. No aneurysm. Stable appearance of right common iliac node measuring 8 mm. No new retroperitoneal adenopathy. Abdominal wall: Unremarkable. Osseous structures: Unremarkable. IMPRESSION: 1. 1.5 cm pericardial effusion. 2. Vicarious excretion within the gallbladder versus layering stones or sludge. 3. Stranding within the retroperitoneum and missed tarry raising the question of acute pancreatitis. No abscess or pseudocyst identified. 4. Bowel anastomoses unchanged in appearance. 5. No evidence for bowel obstruction. 6. Multiple uterine fibroids. 7. Atherosclerosis of the abdominal aorta. Electronically Signed   By: Nolon Nations M.D.   On: 05/26/2015 17:26    Scheduled Meds: . cefTRIAXone (ROCEPHIN)  IV  2 g Intravenous Q24H  . gabapentin  300 mg Oral QHS    . sodium chloride  3 mL Intravenous Q12H   Continuous Infusions: . 0.9 % NaCl with KCl 20 mEq / L 75 mL/hr at 05/27/15 2053       Time spent: 35 minutes    [facility]  [facility] Pager [phone] If 7PM-7AM, please contact night-coverage at [email or web address], password [facility] 05/28/2015, 11:21 AM  LOS: 2 days

## 2015-05-29 DIAGNOSIS — D696 Thrombocytopenia, unspecified: Secondary | ICD-10-CM

## 2015-05-29 LAB — BASIC METABOLIC PANEL
Anion gap: 5 (ref 5–15)
BUN: 11 mg/dL (ref 6–20)
CO2: 25 mmol/L (ref 22–32)
Calcium: 8.8 mg/dL — ABNORMAL LOW (ref 8.9–10.3)
Chloride: 108 mmol/L (ref 101–111)
Creatinine, Ser: 0.81 mg/dL (ref 0.44–1.00)
GFR calc Af Amer: >60 mL/min (ref >60)
GFR calc non Af Amer: >60 mL/min (ref >60)
Glucose, Bld: 95 mg/dL (ref 65–99)
Potassium: 4.1 mmol/L (ref 3.5–5.1)
Sodium: 138 mmol/L (ref 135–145)

## 2015-05-29 LAB — URINE CULTURE: Culture: >=100000

## 2015-05-29 MED ORDER — CIPROFLOXACIN HCL 500 MG PO TABS: 500.0000 mg | ORAL_TABLET | Freq: Two times a day (BID) | ORAL | Status: DC

## 2015-05-29 NOTE — Discharge Summary (Addendum)
Physician Discharge Summary  Kayla [name] GUR:[id number] DOB: [birth year] DOA: 05/26/2015  PCP: No PCP Per Patient  Admit date: 05/26/2015 Discharge date: 05/29/2015  Time spent: 40 minutes  Recommendations for Outpatient Follow-up:  1. 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 female   With h/o stage iv colon cancer in remission, newly diagnosed stage IIa breast CA (right), s/p chemo on 11/3, she developed low back pain last night, the pain lasted whole night, she did not sleep, she also noticed urine color has become darker in the last few days, today , she was walking from the kitchen to the bathroom, started feeling dizzy and passed out, with LOC for a few seconds, family heard her fall and helped her, she returned back to baseline shortly, no confusion, no chest pain, no palpitation, no seizure activity. She presented to [facility] ED, vital stable, labs showed UTI, lactic acidosis, thrombocytopenia, elevated cr, CTA no PE, she was given ivf and rocephin, hospitalist called to admit the patient. Patient reported feeling better after , denies pain when I examined her.  Hospital Course:   E. Coli UTI Presented with urinalysis consistent with UTI, started on Rocephin the emergency department. Blood cultures NGTD, urine culture showed Escherichia coli, patient did not meet sepsis criteria on admission Urine culture showed Escherichia coli, patient initially on  Rocephin. Discharged on 3 more days of ciprofloxacin  Syncope:  Likely from UTI/dehydration, CTA no PE, positive for pericardial effusion. Echo done, results are pending. CT scan of the head showed no acute events, patient is on telemetry.   Thrombocytopenia This is new, could be likely secondary to infection versus chemotherapy. Follow-up as outpatient with oncology, platelets 48,000 on discharge.  Pericardial effusion Hemodynamically stable, echo pending, no SOB or hypotension.   Acute renal failure, mild. Creatinine1.14 (baseline 0.8), from dehydration/uti.  This is resolved with IV fluids creatinine today 0.8.   Hypokalemia: replace k, check mag  Lower back pain: musculoskelatal? No cva tenderness, Ct Renal stone study with contrast obtained from the ED, no report of renal or ureter abnormalities. CT lumbar spine obtained in the ED showed L4-5 disc extrusion and possible bilateral l4-l5 nerve root impingement. Currently pain free after pain meds in the ED, Bed rest, prn pain meds for now.   Prominent caliectasis and ureterectasis on CT chest : ct renal stone study as mentioned above, no renal or ureter abnormality reported, urine no significant rbc. Renal US is normal.  Stage IIA Breast CA (right upper inner quadrant): newly diagnosed (02/2015), s/p docetaxel and cyclophosphamide on 11/3. Defer to oncology.  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   Procedures:  None (i.e. Studies not automatically included, echos, thoracentesis, etc; not x-rays)  Consultations:  None  Discharge Exam: Filed Vitals:   05/29/15 0543  BP: 131/60  Pulse: 86  Temp: 99.4 F (37.4 C)  Resp: 20   General: Alert and awake, oriented x3, not in  any acute distress. HEENT: anicteric sclera, pupils reactive to light and accommodation, EOMI CVS: S1-S2 clear, no murmur rubs or gallops Chest: clear to auscultation bilaterally, no wheezing, rales or rhonchi Abdomen: soft  nontender, nondistended, normal bowel sounds, no organomegaly Extremities: no cyanosis, clubbing or edema noted bilaterally Neuro: Cranial nerves II-XII intact, no focal neurological deficits   Discharge Instructions   Discharge Instructions    Diet - low sodium heart healthy    Complete by:  As directed      Increase activity slowly    Complete by:  As directed           Current Discharge Medication List    START taking these medications   Details  ciprofloxacin (CIPRO) 500 MG tablet Take 1 tablet (500 mg total) by mouth 2 (two) times daily. Qty: 6 tablet, Refills: 0      CONTINUE these medications which have NOT CHANGED   Details  acetaminophen (TYLENOL) 500 MG tablet Take 1,000 mg by mouth every 6 (six) hours as needed for mild pain, moderate pain or headache.     dexamethasone (DECADRON) 4 MG tablet Take 2 tablets (8 mg total) by mouth 2 (two) times daily. Start the day before Taxotere. Then again the day after chemo for 3 days. Qty: 30 tablet, Refills: 1   Associated Diagnoses: Breast cancer of upper-inner quadrant of right female breast (HCC)    fluticasone (FLONASE) 50 MCG/ACT nasal spray Place 1 spray into both nostrils daily as needed for allergies.     gabapentin (NEURONTIN) 300 MG capsule TAKE ONE CAPSULE BY MOUTH AT BEDTIME Qty: 90 capsule, Refills: 0    lisinopril (PRINIVIL,ZESTRIL) 40 MG tablet Take 1 tablet (40 mg total) by mouth daily. Qty: 90 tablet, Refills: 0    ondansetron (ZOFRAN) 8 MG tablet Take 1 tablet (8 mg total) by mouth 2 (two) times daily. Start the day after chemo for 3 days. Then take as needed for nausea or vomiting. Qty: 30 tablet, Refills: 1   Associated Diagnoses: Breast cancer of upper-inner quadrant of right female breast (HCC)    oxyCODONE (OXY IR/ROXICODONE) 5 MG immediate release tablet Take 1-2 tablets (5-10 mg total) by mouth every 4 (four) hours as needed for moderate pain, severe pain or breakthrough pain. Qty: 40 tablet,  Refills: 0       Allergies  Allergen Reactions  . Sulfonamide Derivatives Rash      The results of significant diagnostics from this hospitalization (including imaging, microbiology, ancillary and laboratory) are listed below for reference.    Significant Diagnostic Studies: Ct Head Wo Contrast  05/26/2015  CLINICAL DATA:  Back pain and lightheaded. Syncopal episode 1-2 hours ago. Chemotherapy 5 days ago for breast cancer. Also history of colon cancer. Brain cancer. IV contrast 3 hours ago for CTA chest. EXAM: CT HEAD WITHOUT CONTRAST TECHNIQUE: Contiguous axial images were obtained from the base of the skull through the vertex without intravenous contrast. COMPARISON:  MRI brain 03/13/2015 and CT 03/22/2011 FINDINGS: Ventricles, cisterns and other CSF spaces are within normal. Previous left parietal craniotomy. Postsurgical change/ encephalomalacia over the high left parietal region. Subtle chronic ischemic microvascular disease. No evidence of focal mass, mass effect, shift of midline structures or acute hemorrhage. No evidence of acute infarction. Remainder the exam is within normal. IMPRESSION: No acute intracranial findings. Previous left parietal craniotomy and stable post treatment changes of the left parietal region. Minimal chronic ischemic microvascular disease. Electronically Signed   By: Elberta Fortis M.D.  On: 05/26/2015 17:11   Ct Angio Chest Pe W/cm &/or Wo Cm  05/26/2015  CLINICAL DATA:  Lightheadedness and back pain with syncopal episode 1.5 hours ago. Chemotherapy treatment 5 days ago for breast cancer. History of colon cancer. EXAM: CT ANGIOGRAPHY CHEST WITH CONTRAST TECHNIQUE: Multidetector CT imaging of the chest was performed using the standard protocol during bolus administration of intravenous contrast. Multiplanar CT image reconstructions and MIPs were obtained to evaluate the vascular anatomy. CONTRAST:  OMNIPAQUE IOHEXOL 350 MG/ML SOLN. Scan had to be repeated due  to scanner error during first injection. COMPARISON:  02/02/2015 FINDINGS: Right IJ Port-A-Cath with tip over the SVC just above the cavoatrial junction. Lungs are adequately inflated with minimal bibasilar dependent atelectasis. No evidence of effusion. Airways are normal. Again noted is a mild to moderate pericardial effusion slightly worse compared to the prior exam. There is no evidence of pulmonary emboli. Minimal calcified plaque over the thoracic aorta. No evidence of mediastinal, hilar or axillary adenopathy. Postsurgical change over the medial upper right breast compatible prior lumpectomy. Seroma over the lumpectomy site measuring 1.8 x 4.1 cm. Images through the upper abdomen are within normal. There are degenerative changes of the spine. Review of the MIP images confirms the above findings. IMPRESSION: No evidence pulmonary embolism.  No acute pulmonary disease. Slight interval progression of small to moderate pericardial effusion. Postsurgical change compatible previous lumpectomy of the upper inner right breast. Right IJ Port-A-Cath unchanged. Electronically Signed   By: Elberta Fortis M.D.   On: 05/26/2015 14:37   Ct Lumbar Spine Wo Contrast  05/26/2015  CLINICAL DATA:  Back pain.  Lightheadedness. EXAM: CT LUMBAR SPINE WITHOUT CONTRAST TECHNIQUE: Multidetector CT imaging of the lumbar spine was performed without intravenous contrast administration. Multiplanar CT image reconstructions were also generated. COMPARISON:  CT abdomen and pelvis 02/02/2015. CT chest performed concurrently, reported separately. FINDINGS: Anatomic alignment. Transitional anatomy with partial sacralization L5 on the RIGHT. Intervertebral disc spaces are preserved in height. Dilated collecting systems and ureters appear increased from prior CT abdomen and pelvis from July 2016. It is possibly secondary to known large fibroid uterus with mild degree of obstruction, but recurrent colon cancer not excluded. Consider CT abdomen  and pelvis with contrast for further surveillance. No definite compressive lesion at L1-2, L2-3, or L3-4. At L4-5 there is a central and leftward disc extrusion with posterior element hypertrophy. Moderate stenosis is suspected. BILATERAL L4 and L5 nerve root impingement likely. At L5-S1 there is a moderate-sized calcified disc protrusion/disc osteophyte complex centrally. No definite subarticular zone or foraminal zone narrowing. IMPRESSION: Transitional anatomy.  L5 is partially sacralized on the RIGHT. Altered biomechanics secondary to transitional anatomy, leads to significant the disc pathology and posterior element disease at L4-5 where there is a central and leftward extrusion, with advanced posterior element hypertrophy and moderate stenosis. BILATERAL L4 and L5 nerve root impingement are likely. Prominent caliectasis and ureterectasis, increased from priors, greater on the RIGHT. Significance uncertain. Consider CT abdomen pelvis for further evaluation. Electronically Signed   By: Elsie Stain M.D.   On: 05/26/2015 14:56   US Renal  05/26/2015  CLINICAL DATA:  Acute renal failure. EXAM: RENAL / URINARY TRACT ULTRASOUND COMPLETE COMPARISON:  CT earlier this day FINDINGS: Right Kidney: Length: 11.6 cm. Echogenicity within normal limits. No mass or hydronephrosis visualized. Left Kidney: Length: 11.0 cm. Echogenicity within normal limits. No mass or hydronephrosis visualized. Bladder: Appears normal for degree of bladder distention. Both ureteral jets are seen. IMPRESSION: Normal  renal ultrasound.  No obstructive uropathy. Electronically Signed   By: Rubye Oaks M.D.   On: 05/26/2015 22:56   Ct Renal Stone Study  05/26/2015  CLINICAL DATA:  Pt states that her back hurts and is lightheaded. Family states pt had syncopal episode about 1.5 hours ago. Pt last chemo tx on last Thursday Nov. 3, h/o colon ca, brain ca, left flank >right flank pain, h/o diverticulitis. EXAM: CT ABDOMEN AND PELVIS WITHOUT  CONTRAST TECHNIQUE: Multidetector CT imaging of the abdomen and pelvis was performed following the standard protocol without IV contrast. COMPARISON:  02/02/2015 FINDINGS: Lower chest: Pericardial effusion is present measuring 1.5 cm maximum depth on the images provided. No pulmonary nodules, pleural effusions, or infiltrates. Upper abdomen: There is residual contrast in the collecting systems following CT of the chest earlier today. There is residual contrast in the renal collecting systems. Kidneys otherwise have a normal appearance. No focal abnormality identified within the liver, spleen, or adrenal glands. There is stranding in the peripancreatic region. No focal lesions are identified within the pancreas. No pseudocyst or abscess identified. The gallbladder is present and contains layering debris, stones, or contrast. Gastrointestinal tract: The stomach is normal in appearance. Anastomosis of small bowel is identified in the right upper quadrant, unchanged in appearance. Patient has had previous partial colonic resection with anastomosis identified in the left mid abdomen at the level of the descending colon. Within this region there is focal distension with stool but no evidence for obstruction at this level. The appendix is well seen and has a normal appearance. Pelvis: The uterus is present appears somewhat nodular, containing numerous fibroids and calcifications. No adnexal mass. No free pelvic fluid. Retroperitoneum: There is mild atherosclerotic calcification of the abdominal aorta. No aneurysm. Stable appearance of right common iliac node measuring 8 mm. No new retroperitoneal adenopathy. Abdominal wall: Unremarkable. Osseous structures: Unremarkable. IMPRESSION: 1. 1.5 cm pericardial effusion. 2. Vicarious excretion within the gallbladder versus layering stones or sludge. 3. Stranding within the retroperitoneum and missed tarry raising the question of acute pancreatitis. No abscess or pseudocyst  identified. 4. Bowel anastomoses unchanged in appearance. 5. No evidence for bowel obstruction. 6. Multiple uterine fibroids. 7. Atherosclerosis of the abdominal aorta. Electronically Signed   By: Norva Pavlov M.D.   On: 05/26/2015 17:26    Microbiology: Recent Results (from the past 240 hour(s))  Culture, Urine     Status: None   Collection Time: 05/26/15  2:18 PM  Result Value Ref Range Status   Specimen Description URINE, CLEAN CATCH  Final   Special Requests NONE  Final   Culture   Final    >=100,000 COLONIES/mL ESCHERICHIA COLI Performed at [facility]    Report Status 05/29/2015 FINAL  Final   Organism ID, Bacteria ESCHERICHIA COLI  Final      Susceptibility   Escherichia coli - MIC*    AMPICILLIN <=2 SENSITIVE Sensitive     CEFAZOLIN <=4 SENSITIVE Sensitive     CEFTRIAXONE <=1 SENSITIVE Sensitive     CIPROFLOXACIN <=0.25 SENSITIVE Sensitive     GENTAMICIN 4 SENSITIVE Sensitive     IMIPENEM 0.5 SENSITIVE Sensitive     NITROFURANTOIN <=16 SENSITIVE Sensitive     TRIMETH/SULFA <=20 SENSITIVE Sensitive     AMPICILLIN/SULBACTAM <=2 SENSITIVE Sensitive     PIP/TAZO <=4 SENSITIVE Sensitive     * >=100,000 COLONIES/mL ESCHERICHIA COLI  Blood culture (routine x 2)     Status: None (Preliminary result)   Collection Time: 05/26/15  4:14 PM  Result Value Ref Range Status   Specimen Description BLOOD LEFT WRIST  Final   Special Requests BOTTLES DRAWN AEROBIC ONLY 6CC  Final   Culture   Final    NO GROWTH 2 DAYS Performed at [facility]    Report Status PENDING  Incomplete  MRSA PCR Screening     Status: None   Collection Time: 05/26/15  5:55 PM  Result Value Ref Range Status   MRSA by PCR NEGATIVE NEGATIVE Final    Comment:        The GeneXpert MRSA Assay (FDA approved for NASAL specimens only), is one component of a comprehensive MRSA colonization surveillance program. It is not intended to diagnose MRSA infection nor to guide or monitor treatment  for MRSA infections.   Blood culture (routine x 2)     Status: None (Preliminary result)   Collection Time: 05/26/15  6:20 PM  Result Value Ref Range Status   Specimen Description BLOOD LEFT ARM  Final   Special Requests BOTTLES DRAWN AEROBIC AND ANAEROBIC 5CC  Final   Culture   Final    NO GROWTH 2 DAYS Performed at [facility]    Report Status PENDING  Incomplete     Labs: Basic Metabolic Panel:  Recent Labs Lab 05/26/15 1220 05/26/15 1820 05/27/15 0453 05/29/15 0544  NA 137  --  139 138  K 3.0*  --  4.2 4.1  CL 100*  --  109 108  CO2 30  --  26 25  GLUCOSE 117*  --  97 95  BUN 27*  --  17 11  CREATININE 1.14*  --  0.80 0.81  CALCIUM 8.6*  --  8.1* 8.8*  MG  --  2.2 2.1  --    Liver Function Tests:  Recent Labs Lab 05/27/15 0453  AST 17  ALT 22  ALKPHOS 62  BILITOT 1.4*  PROT 5.4*  ALBUMIN 2.8*    Recent Labs Lab 05/26/15 1820 05/27/15 0453  LIPASE 42 33   No results for input(s): AMMONIA in the last 168 hours. CBC:  Recent Labs Lab 05/26/15 1220 05/27/15 0453  WBC 8.1 3.4*  HGB 13.8 11.6*  HCT 42.6 35.7*  MCV 85.4 85.2  PLT 60* 48*   Cardiac Enzymes:  Recent Labs Lab 05/26/15 1820  TROPONINI <0.03   BNP: BNP (last 3 results) No results for input(s): BNP in the last 8760 hours.  ProBNP (last 3 results) No results for input(s): PROBNP in the last 8760 hours.  CBG: No results for input(s): GLUCAP in the last 168 hours.     Signed:  Tanush Drees A  [facility] 05/29/2015, 10:56 AM

## 2015-05-29 NOTE — Care Management Important Message (Signed)
Important Message  Patient Details  Name: DURA MCCORMACK MRN: [id number] Date of Birth: [birth year]   Medicare Important Message Given:  Yes    Camillo Flaming 05/29/2015, 12:37 [place] Message  Patient Details  Name: PHYNIX HORTON MRN: [id number] Date of Birth: Mar 09, 1951   Medicare Important Message Given:  Yes    Camillo Flaming 05/29/2015, 12:37 PM

## 2015-05-31 LAB — CULTURE, BLOOD (ROUTINE X 2)
CULTURE: NO GROWTH
Culture: NO GROWTH

## 2015-06-02 ENCOUNTER — Other Ambulatory Visit (HOSPITAL_BASED_OUTPATIENT_CLINIC_OR_DEPARTMENT_OTHER): Payer: Medicare HMO

## 2015-06-02 ENCOUNTER — Ambulatory Visit (HOSPITAL_BASED_OUTPATIENT_CLINIC_OR_DEPARTMENT_OTHER): Payer: Medicare HMO | Admitting: Hematology

## 2015-06-02 ENCOUNTER — Encounter: Payer: Self-pay | Admitting: Hematology

## 2015-06-02 ENCOUNTER — Telehealth: Payer: Self-pay | Admitting: Hematology

## 2015-06-02 VITALS — BP 141/68 | HR 97 | Temp 98.3°F | Resp 20 | Ht 64.0 in | Wt 187.5 lb

## 2015-06-02 DIAGNOSIS — Z85038 Personal history of other malignant neoplasm of large intestine: Secondary | ICD-10-CM

## 2015-06-02 DIAGNOSIS — G620 Drug-induced polyneuropathy: Secondary | ICD-10-CM | POA: Diagnosis not present

## 2015-06-02 DIAGNOSIS — C7931 Secondary malignant neoplasm of brain: Secondary | ICD-10-CM

## 2015-06-02 DIAGNOSIS — C50211 Malignant neoplasm of upper-inner quadrant of right female breast: Secondary | ICD-10-CM | POA: Diagnosis not present

## 2015-06-02 DIAGNOSIS — Z170 Estrogen receptor positive status [ER+]: Secondary | ICD-10-CM

## 2015-06-02 DIAGNOSIS — C186 Malignant neoplasm of descending colon: Secondary | ICD-10-CM

## 2015-06-02 DIAGNOSIS — I10 Essential (primary) hypertension: Secondary | ICD-10-CM

## 2015-06-02 LAB — COMPREHENSIVE METABOLIC PANEL (CC13)
ALT: 41 U/L (ref 0–55)
AST: 23 U/L (ref 5–34)
Albumin: 3.4 g/dL — ABNORMAL LOW (ref 3.5–5.0)
Alkaline Phosphatase: 117 U/L (ref 40–150)
Anion Gap: 9 mEq/L (ref 3–11)
BILIRUBIN TOTAL: 0.52 mg/dL (ref 0.20–1.20)
BUN: 10.1 mg/dL (ref 7.0–26.0)
CO2: 25 meq/L (ref 22–29)
Calcium: 9.3 mg/dL (ref 8.4–10.4)
Chloride: 108 mEq/L (ref 98–109)
Creatinine: 0.9 mg/dL (ref 0.6–1.1)
EGFR: 85 mL/min/{1.73_m2} — AB (ref >90)
GLUCOSE: 135 mg/dL (ref 70–140)
Potassium: 3.8 mEq/L (ref 3.5–5.1)
SODIUM: 142 meq/L (ref 136–145)
TOTAL PROTEIN: 6.7 g/dL (ref 6.4–8.3)

## 2015-06-02 LAB — CBC WITH DIFFERENTIAL/PLATELET
BASO%: 1.3 % (ref 0.0–2.0)
Basophils Absolute: 0.1 10*3/uL (ref 0.0–0.1)
EOS ABS: 0 10*3/uL (ref 0.0–0.5)
EOS%: 0.1 % (ref 0.0–7.0)
HCT: 40.3 % (ref 34.8–46.6)
HGB: 13.2 g/dL (ref 11.6–15.9)
LYMPH%: 24.9 % (ref 14.0–49.7)
MCH: 27.6 pg (ref 25.1–34.0)
MCHC: 32.8 g/dL (ref 31.5–36.0)
MCV: 84.3 fL (ref 79.5–101.0)
MONO#: 0.7 10*3/uL (ref 0.1–0.9)
MONO%: 11.9 % (ref 0.0–14.0)
NEUT%: 61.8 % (ref 38.4–76.8)
NEUTROS ABS: 3.7 10*3/uL (ref 1.5–6.5)
Platelets: 272 10*3/uL (ref 145–400)
RBC: 4.78 10*6/uL (ref 3.70–5.45)
RDW: 14.5 % (ref 11.2–14.5)
WBC: 6 10*3/uL (ref 3.9–10.3)
lymph#: 1.5 10*3/uL (ref 0.9–3.3)

## 2015-06-02 LAB — TECHNOLOGIST REVIEW

## 2015-06-02 NOTE — Progress Notes (Signed)
Hematology and Oncology Follow Up Visit Date of Visit: 06/02/2015   Kayla [name] [id number] August 24, 1950 64 y.o. 06/02/2015 PCP: [facility] [phone])  Principle Diagnosis:  1. Colon cancer, T4N1M1, Stage IV, diagnosed on 10/27/2008, brain recurrence in 03/2011  2. Stage IIA right breast cancer, diagnosed in 02/2015  Oncology History   Breast cancer of upper-inner quadrant of right female breast [facility])   Staging form: Breast, AJCC 7th Edition     Clinical: Stage IIA (T2, N0, M0) - Unsigned     Pathologic stage from 04/14/2015: Stage IIA (T2, N0, cM0) - Signed by Truitt Merle, MD on 05/05/2015 Cancer of left colon [facility])   Staging form: Colon and Rectum, AJCC 7th Edition     Pathologic: T4a, N1a, M1 - Unsigned        Breast cancer of upper-inner quadrant of right female breast ([place])   02/27/2015 Mammogram Diagnostic mammogram Showed a 2.5 cm irregular mass within the far posterior upper inner right breast, ultrasound confirmed a 1.9 x 1.0 x 0.8 cm mass at 1:30 o'clock 15 submitted from nipple. No axillary adenopathy   03/03/2015 Initial Diagnosis Breast cancer of upper-inner quadrant of right female breast   03/03/2015 Initial Biopsy Right breast needle biopsy showed invasive ductal carcinoma, grade 1-2.   03/03/2015 Receptors her2 ER 100%+, PR 80%+, ki67 15%   04/14/2015 Surgery right breast lumpectomy and sentinel lymph node biopsy. Surgical margins were negative.   04/14/2015 Pathology Results right breast invasive ductal carcinoma, grade 2, 2.7 cm,(+) DCIS, margins were negative, 4 sentinel lymph nodes and one axillary lymph nodes were negative, (+)lymphovascular invasion.   04/14/2015 Oncotype testing RS 30, which predicts 10-year risk of distance recurrence with tamoxifen alone 20%, intermediate risk   05/21/2015 -  Adjuvant Chemotherapy  Adjuvant chemotherapy with docetaxel 75 mg/m, and Cytoxan 600 mg/m, every 3 weeks, starting 05/21/2015, planning for total 4  cycles   05/26/2015 - 05/29/2015 Hospital Admission Pt was admitted for UTI ans syncope, treated with antibiotics and IVF     Prior Therapy:  Kayla [name] underwent surgical resection of her tumor with a colostomy at the time of her surgery on 10/27/2008. The colostomy has been subsequently reversed on 09/20/2010. Kayla [name] received chemotherapy consisting of FOLFOX for 10 treatments from 12/10/2008 through 06/02/2009. She achieved a partial remission from these treatments. She then received additional chemotherapy with 5-FU, leucovorin, and 5-FU by continuous infusion along with Avastin from 06/23/2009 through 11/17/2009. 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   Current therapy:  Adjuvant chemotherapy with docetaxel 75 mg/m, and Cytoxan 600 mg/m, every 3 weeks, started on 05/21/2015, planning for total 4 cycles  Interim History:  Kayla [name] returns for follow-up  After her first cycle of chemotherapy. She tolerated well overall, had mild fatigue, no significant nausea, diarrhea, no other complaints. She had an episode of syncope at home on May 26 2015, 5 days after her chemotherapy, and was admitted to [facility]. She was treated for  asymptomatic UTI, and was discharged home on 05/29/2015. She has been doing well since then, she has good appetite and eating well. Denies any fever or chills or any bleedings.   Past Medical History  Diagnosis Date  . Pericardial effusion     echo 08/13/09  . LVH (left ventricular hypertrophy)     mod/severe. echo 1/11. EF 65-70%   . Hypertension   . Thrombocytopenia ([place])   . 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 Allergy     sulfa  . Colon cancer ([place])     colon/ 2010/surg/chemo  . History of radiation therapy 04/15/11    17 Gy single fraction  l parietal brain metastais  . Heart murmur   . Peripheral vascular disease ([place])   . Shortness of breath   . Blood transfusion   . GERD (gastroesophageal  reflux disease)   . Headache(784.0)   . Neuromuscular disorder ([place])     peripheral neuropathy feet  . Arthritis   . Diverticulosis 07/22/2010    sigmoid colon  . Family history of breast cancer   . Family history of colon cancer    GYN HISTORY  Menarchal: 14 LMP: 23 Contraceptive: a few years  HRT: no  G2P2:    Medications: I have reviewed the patient's current medications.  Current Outpatient Prescriptions  Medication Sig Dispense Refill  . acetaminophen (TYLENOL) 500 MG tablet Take 1,000 mg by mouth every 6 (six) hours as needed for mild pain, moderate pain or headache.     . dexamethasone (DECADRON) 4 MG tablet Take 2 tablets (8 mg total) by mouth 2 (two) times daily. Start the day before Taxotere. Then again the day after chemo for 3 days. 30 tablet 1  . fluticasone (FLONASE) 50 MCG/ACT nasal spray Place 1 spray into both nostrils daily as needed for allergies.     [name] gabapentin (NEURONTIN) 300 MG capsule TAKE ONE CAPSULE BY MOUTH AT BEDTIME (Patient taking differently: TAKE 300 MG BY MOUTH AT BEDTIME) 90 capsule 0  . lisinopril (PRINIVIL,ZESTRIL) 40 MG tablet Take 1 tablet (40 mg total) by mouth daily. 90 tablet 0  . ondansetron (ZOFRAN) 8 MG tablet Take 1 tablet (8 mg total) by mouth 2 (two) times daily. Start the day after chemo for 3 days. Then take as needed for nausea or vomiting. 30 tablet 1  . oxyCODONE (OXY IR/ROXICODONE) 5 MG immediate release tablet Take 1-2 tablets (5-10 mg total) by mouth every 4 (four) hours as needed for moderate pain, severe pain or breakthrough pain. 40 tablet 0   No current facility-administered medications for this visit.   Allergies:  Allergies  Allergen Reactions  . Sulfonamide Derivatives Rash    Oncology History: Diagnosis of colon cancer dates back to 10/27/2008 when Kayla [name] presented with what turned out to be a bowel perforation through tumor. Stage at that time was T4 N1 M1 with pulmonary metastatic disease. The K-ras mutation was  detected. Kayla [name] underwent surgical resection of her tumor with a colostomy at the time of her surgery on 10/27/2008. The colostomy has been subsequently reversed on 09/20/2010. Kayla [name] received chemotherapy consisting of FOLFOX for 10 treatments from 12/10/2008 through 06/02/2009. She achieved a partial remission from these treatments. She then received additional chemotherapy with 5-FU, leucovorin, and 5-FU by continuous infusion along with Avastin from 06/23/2009 through 11/17/2009. 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 She was doing well without evidence of disease until she developed a right hemiparesthesia in September 2012 and was found to have a 3 x 3 x 2.4 cm, lobulated, enhancing mass in the left parietal lobe with marked surrounding edema. A PET scan on 03/25/2011 showed resolution of the previously identified pulmonary nodules with no residual hypermetabolic activity. The pericardial effusion, which has been present since diagnosis, was unchanged. There were also uterine fibroids and a low-density lesion in the anterior spleen that was stable and not associated with any hypermetabolic activity. Of note, Kayla [name]  had a markedly elevated CEA up to 24.7 on 03/23/2011. On 06/23/2011, the CEA was less than 0.5. Dr. Erline Levine resected the recurrent metastatic colon cancer on 03/31/2011. This was followed by stereotactic radiation on 04/15/2011. The patient underwent a left-sided craniotomy and excision of the mass involving her left parietal lobe on 11/10/2011 by Dr. Erline Levine. The pathology report was negative for any malignancy. Pathology report indicated benign brain with fibrosis, hemorrhage, hemosiderin deposition, abundant dystrophic calcifications, and mixed acute and chronic inflammation including foreign body multinucleated giant cells.    Past Medical History, Surgical history, Social history, and Family History were reviewed and updated.  SMOKING HISTORY:   The patient has smoked intermittently, about 2 packs of cigarettes per week, since she was a teenager, but stopped smoking in 2010.  Review of Systems: Constitutional:  Negative for fever, chills, night sweats, anorexia, weight loss, pain. Cardiovascular: no chest pain or dyspnea on exertion Respiratory: no cough, shortness of breath, or wheezing Neurological: no TIA or stroke symptoms Dermatological: negative for rash ENT: negative for - epistaxis, headaches, tinnitus, vertigo or visual changes Skin: Negative. Gastrointestinal: no abdominal pain, change in bowel habits, or black or bloody stools positive for - heartburn Genito-Urinary: no dysuria, trouble voiding, or hematuria Hematological and Lymphatic: negative for - bleeding problems, fatigue, jaundice or pallor Breast: negative for breast lumps Musculoskeletal: positive for - muscular weakness and pins and needles with some numbness (R>L) in lower extremities negative for - gait disturbance Remaining ROS negative.  Physical Exam: Blood pressure 141/68, pulse 97, temperature 98.3 F (36.8 C), temperature source Oral, resp. rate 20, height 5' 4"  (1.626 m), weight 187 lb 8 oz (85.049 kg), SpO2 99 %. ECOG: 0 General appearance: alert, cooperative, appears stated age, no distress and mildly obese Head: Normocephalic, without obvious abnormality, atraumatic Neck: no adenopathy, supple, symmetrical, trachea midline and thyroid not enlarged, symmetric, no tenderness/mass/nodules HEENT: PERRLA; EOMi; No sclerae icterus. OP clear of masses.  Lymph nodes: Cervical, supraclavicular, and axillary nodes normal. Heart:regular rate and rhythm, S1, S2 normal, no murmur, click, rub or gallop Lung:chest clear, no wheezing, rales, normal symmetric air entry,  + R sided port-a-cath. Abdomin: soft, non-tender, without masses or organomegaly, normal bowel sounds and surgical scars well healed. EXT:No peripheral edema.  R>L decreased sensation and  mildly decreased strength (noted in prior exams) Neuro: R lower extremity weakness and decreased sensation.  Normal gait.  Good finger to nose bilaterally. Otherwise no focal deficits. Breasts: Breast inspection showed them to be symmetrical with no nipple discharge. The surgical incision in right breast and axilla are well-healed, mild tenderness. Palpation of the breasts and axilla revealed no obvious mass that I could appreciate.    Lab Results: CBC Latest Ref Rng 06/02/2015 05/27/2015 05/26/2015  WBC 3.9 - 10.3 10e3/uL 6.0 3.4(L) 8.1  Hemoglobin 11.6 - 15.9 g/dL 13.2 11.6(L) 13.8  Hematocrit 34.8 - 46.6 % 40.3 35.7(L) 42.6  Platelets 145 - 400 10e3/uL 272 48(L) 60(L)    CMP Latest Ref Rng 06/02/2015 05/29/2015 05/27/2015  Glucose 70 - 140 mg/dl 135 95 97  BUN 7.0 - 26.0 mg/dL 10.1 11 17   Creatinine 0.6 - 1.1 mg/dL 0.9 0.81 0.80  Sodium 136 - 145 mEq/L 142 138 139  Potassium 3.5 - 5.1 mEq/L 3.8 4.1 4.2  Chloride 101 - 111 mmol/L - 108 109  CO2 22 - 29 mEq/L 25 25 26   Calcium 8.4 - 10.4 mg/dL 9.3 8.8(L) 8.1(L)  Total Protein 6.4 - 8.3 g/dL 6.7 -  5.4(L)  Total Bilirubin 0.20 - 1.20 mg/dL 0.52 - 1.4(H)  Alkaline Phos 40 - 150 U/L 117 - 62  AST 5 - 34 U/L 23 - 17  ALT 0 - 55 U/L 41 - 22   CEA  Status: Finalresult Visible to patient:  Not Released Nextappt: 08/11/2014 at 09:40 AM in Radiology (AP-MM 1) Dx:  Metastatic cancer to brain           Ref Range 46moago  611mogo  [id number]  [id number]     CEA 0.0 - 5.0 ng/mL 0.9 1.3 1.4 1.1       PATHOLOGY REPORT Diagnosis 04/14/2015 1. Breast, lumpectomy, right - INVASIVE GRADE II DUCTAL CARCINOMA, SPANNING 2.7 CM IN GREATEST DIMENSION. - ASSOCIATED INTERMEDIATE GRADE DUCTAL CARCINOMA IN SITU. - LYMPH/VASCULAR INVASION IS IDENTIFIED. - MARGINS ARE NEGATIVE. - SEE ONCOLOGY TEMPLATE. 2. Lymph node, sentinel, biopsy, right axillary - ONE BENIGN LYMPH NODE WITH NO TUMOR SEEN (0/1). 3. Lymph node, sentinel, biopsy,  right axillary - ONE BENIGN LYMPH NODE WITH NO TUMOR SEEN (0/1). 4. Lymph node, sentinel, biopsy, right axillary - ONE BENIGN LYMPH NODE WITH NO TUMOR SEEN (0/1). 5. Lymph node, biopsy, right axillary - ONE BENIGN LYMPH NODE WITH NO TUMOR SEEN (0/1). Microscopic Comment 1. BREAST, INVASIVE TUMOR, WITH LYMPH NODES PRESENT Specimen, including laterality and lymph node sampling (sentinel, non-sentinel): Right partial breast with right sentinel lymph node sampling. Procedure: Right breast lumpectomy with right sentinel lymph node biopsies. Histologic type: Invasive ductal carcinoma. Grade: 2. Tubule formation: 3. Nuclear pleomorphism: 2. Mitotic: 1. Tumor size (gross measurement): 2.7 cm. Margins: Invasive, distance to closest margin: 0.2 cm (anterior margin). In-situ, distance to closest margin: At least 0.4 cm (all margins). Lymphovascular invasion: Yes, lymph/vascular invasion is identified. Ductal carcinoma in situ: Yes. Grade: Intermediate grade. Extensive intraductal component: No. Lobular neoplasia: Not identified. Tumor focality: Unifocal. Treatment effect: Not applicable. Extent of tumor: Tumor confined to breast parenchyma. Skin: Not received. Nipple: Not received. Skeletal muscle: Not received. Lymph nodes Examined: 3 Sentinel. 1 Non-sentinel. 4 Total. Lymph nodes with metastasis: 0. Isolated tumor cells (< 0.2 mm): 0. Micrometastasis: (> 0.2 mm and < 2.0 mm): 0. Macrometastasis: (> 2.0 mm): 0. Extracapsular extension: Not applicable. Breast prognostic profile: Performed on previous case [phone] receptor: 100%, positive. Progesterone receptor: 80%, positive. Her-2 neu: 1.30 ratio, negative. Ki-67: 15%. Non-neoplastic breast: Unremarkable. TNM: pT2, pN0. Comments: As Her-2 neu was previously negative, this will be repeated on representative tumor from the current specimen, and will be reported in an addendum to follow. (RH:ds  04/15/15)  Results: HER2 - NEGATIVE RATIO OF HER2/CEP17 SIGNALS 1.17 AVERAGE HER2 COPY NUMBER PER CELL 2.05   Radiological Studies: 1. MRI of the head with and without IV contrast on 03/23/2011 showed a solitary enhancing mass in the left parietal lobe with surrounding white matter edema, most compatible with solitary metastatic deposit. The mass lesion measured 2.7 x 2.5 cm.  2. PET scan from 03/25/2011 showed resolution of the previously- identified pulmonary nodules with no residual hypermetabolic activity noted in the neck, chest, abdomen or pelvis to suggest active malignancy. There was a pericardial effusion and some subsegmental atelectasis in the lower lobes. There were also uterine fibroids and a low-density lesion in the anterior spleen that was stable and not associated with hypermetabolic activity.  3. MRI of the head with and without IV contrast showed Stealth protocol utilized to evaluate left parietal mass. The mass measured 3 x 3 x 2.4 cm  with marked surrounding vasogenic edema.  4. MRI of the head with and without IV contrast on 04/08/2011 showed postsurgical changes of tumor removal in the left parietal lobe and a postsurgical hematoma with enhancement. There was 3 mm of midline shift. No other metastatic deposits.  5. MRI of the head with and without IV contrast on 06/28/2011 showed a 3 cm area of restricted diffusion lying within the previous area of  left parietal surgical resection for colon cancer. There was enhancement of the peripheral aspect of the cavity, marked restricted diffusion and increasing vasogenic edema. There was concern about the development of a brain abscess.  6. MRI of the head with and without IV contrast on 07/29/2011 showed left parietal postoperative hematoma was smaller compared with the  prior study. There was no nodular enhancement seen to indicate tumor. No new enhancing lesions were seen. There was significant  improvement in the left parietal edema.   7. Digital screening mammogram was negative on 08/02/2011.  8. An MRI of the head with and without IV contrast on 10/21/2011 showed development of gyriform enhancement circumferentially along the margins of the left parietal postoperative space/hematoma. Marked increase in vasogenic edema was seen. 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 There was further contraction of the hematoma/postoperative space in the left parietal cortical region, now measuring 17 x 19 mm in transverse diameter as opposed to 22 x 21 mm previously.  Left-to-right shift was 2 mm.  9. Nuclear medicine brain PET-CT scan carried out on 10/27/2011 showed hypermetabolic activity in the high left parietal lobe which corresponds to gyriform enhancement on comparison MRI from 10/21/2011. This was felt to be most consistent with recurrent  tumor.  10. Chest x-ray, 2 view from 11/04/2011, showed chronic cardiomegaly. Port-A-Cath was in place. No acute disease was present.  11. MRI of the head with and without IV contrast on 01/27/2012 showed findings that were felt to be consistent with progression of disease. This exam was compared with the MRI of 10/21/2011. It was recognized that the patient underwent surgical resection of the mass on 11/10/2011.  There was felt to be on the present exam residual peripheral enhancement of approximately 35 x 40 x 21 mm with infiltration of the cortex and deep white matter. It was felt that the enhancement had progressed, despite removal of the central focus. There was moderate vasogenic  edema throughout the left hemisphere but slightly decreased from the MRI from 10/21/2011. No new lesions were seen. There was mild atrophy with chronic microvascular ischemic change, but no midline shift. Major intracranial vascular structures were patent.  12. MRI of the brain with and without IV contrast on 05/04/2012 showed previous left parietal lobe surgery for  resection of tumor and radiation necrosis.  No new abnormalities were noted. 13. Chest x-ray, 2 view, on 07/03/2012 showed enlargement of the cardiac silhouette with some lingular scarring, as compared with the chest x-ray from 11/04/2011. 14. 2-D echocardiogram from 07/12/2012 showed left ventricular ejection fraction of 55-60%.  There was a small to moderate circumferential pericardial effusion with no signs of tamponade.  There was felt to be no significant change from the 2-D echocardiogram dated 08/13/2009. 15. Digital bilateral screening mammogram on 08/06/2012 was negative. 16. 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  There was no progression or new brain  metastasis identified. 17. PET scan from 10/16/2012 showed no specific features to suggest metastatic disease.  There was diffuse uptake throughout the thyroid gland, possibly due to hyperthyroidism. 18. MRI of the brain with and without IV contrast on 03/12/2013 showed residual gyriform enhancement surrounding the area of previous surgical resection of a left parietal metastasis with subsequent resection of radionecrosis. The findings likely represent some residual brain injury, but there is no significant progression of radionecrosis and no new lesions seen. 19. MRI of the brain with and without IV contrast on 11/01/2013 showed Stable posttherapy appearance of the brain since May of 2014. No progression or new brain metastasis identified. 20. CT chest,abomen and pelvis with contrast. 1. No evidence of thoracic metastasis.  2. Small prevascular lymph node is similar prior. 3. Stable pericardial effusion. 4. Interval increase in the volume presacral lymph node in the upper pelvis. This has progressed slowly from 2011. Recommend attention on follow-up versus restaging FDG PET scan. 5. Stable  anastomosis in the descending colon. 21. CT chest, abdomen and pelvis with contrast 07/126/2015. 1. No evidence of local colon cancer recurrence or metastasis within the abdomen or pelvis. 2. Retention of stool at the colocolonic anastomosis in the left colon is unchanged comparison exam. No obstructing lesion identified.  22. Mesenteric lymph node is again demonstrated with no increased size. 4. Stable splenic lesion likely representing a complex cysts or hemangioma. 5. Stable small pericardial effusion 23. Brain MRI 11/11/2014: stable and satisfactory posttherapy appearance of the brain since 2014 and 2015. No new brain metastasis identified. 24. CT chest, abdomen and pelvis w contrast 02/02/2015: 1. No new or progressive findings to suggest recurrent colorectal, cancer in the chest, abdomen, or pelvis. 2. 16 mm soft tissue nodule in the medial right breast. Correlation with mammographic history recommended. 3. Fibroid change in the uterus. 4. Stable hypodense lesion in the anterior spleen, compatible with complex cyst or hemangioma. 5. Stable appearance of the trace pericardial effusion.   Impression and Plan: 64 year old African-American female, postmenopausal  1. Right breat invasive ductal carcinoma, pT2N0M0, stage IIA, ER+/PR+, HER2-, Oncotype RS 30 -I reviewed her surgical pathology findings with her in great details. -I reviewed her Oncotype DX test results. The recurrence score is 30, which predicts 10 year risk of distant recurrence 20% with tamoxifen alone. This is considered intermediate risk, but close to high risk group. -. Her recurrence score is very close to high risk group, and she is in good health overall, I recommend her to have adjuvant chemotherapy, with docetaxel and Cytoxan, intravenously every 3 weeks, for total of 4 cycles.  -The goal of treatment is curative. -Given this strong ER/PR positivity, I recommend adjuvant endocrine therapy with aromatase inhibitor, after she  completes breast radiation. -She would benefit from adjuvant breast radiation also. This will be followed up by her radiation oncologist Dr. Tammi Klippel - she tolerated the first cycle chemotherapy TC  Well overall, she did have a syncope episode,  With asymmetric UTI and dehydration,  Required hospitalization. She has  Recovered very well. Lab reviewed,  Thrombocytopenia has resolved, acute renal fever has resolved also. - She'll return on 06/12/2015 for second cycle TC. We'll continue the same dose.  2.  Episode of syncope  On 05/26/2015 - likely secondary to dehydration, she did have mild acute renal failure on that time. - resolved now. I strongly encouraged her to drink more fluids after her next chemotherapy.   3. HTN - her lisinopril has been held since her recent  hospitalization for syncope - her problem is normal today. I'll continue hold her lisinopril,  And asked her to check her blood pressure frequently, and restarted  Lisinopril if her blood pressure  Persistently above 140/90.  4. Stage IV Colon Cancer mets to lung and brain, NED --Kayla [name] continues to do well with no evidence for disease recurrence.  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   -She is clinically doing very well. Her CEA level has been normal.  - her last brain MRI in August 2016  Was negative for recurrenc -her recent restaging CT scans in 01/2015 showed no evidence of recurrence - she is clinically doing well, physical exam is unremarkable, we'll continue observation. -follow up with lab CBC, CMP and CEA every 3 months.  5. Peripheral neuropathy, grade 1, secondary to chemotherapy  -stable  6 Genetics -Given her positive family and personal history of breast (Mother at age of 22 and cousin) and colon cancer (cousin), she was referred to genetic counselor -Her genetic test was negative  She will continue follow-up with her  primary care physician for other medical problems  Plan - return to clinic on 06/12/2015 for cycle 2 chemotherapy TC, no neulasta  -I'll see her back in before her third cycle chemo on 12/16   I spent 20 minutes counseling the patient face to face. The total time spent in the appointment was 25 minutes.   Truitt Merle  06/02/2015

## 2015-06-02 NOTE — Telephone Encounter (Signed)
Gave and printed appt sched and avs for pt for NOV thru Jan 2017

## 2015-06-10 ENCOUNTER — Other Ambulatory Visit: Payer: Self-pay | Admitting: Hematology

## 2015-06-12 ENCOUNTER — Other Ambulatory Visit (HOSPITAL_BASED_OUTPATIENT_CLINIC_OR_DEPARTMENT_OTHER): Payer: Medicare HMO

## 2015-06-12 ENCOUNTER — Ambulatory Visit (HOSPITAL_BASED_OUTPATIENT_CLINIC_OR_DEPARTMENT_OTHER): Payer: Medicare HMO

## 2015-06-12 ENCOUNTER — Ambulatory Visit: Payer: Medicare HMO

## 2015-06-12 VITALS — BP 126/72 | HR 98 | Temp 98.0°F | Resp 18

## 2015-06-12 VITALS — BP 141/70 | HR 95 | Temp 98.0°F | Resp 18

## 2015-06-12 DIAGNOSIS — Z5111 Encounter for antineoplastic chemotherapy: Secondary | ICD-10-CM

## 2015-06-12 DIAGNOSIS — C50211 Malignant neoplasm of upper-inner quadrant of right female breast: Secondary | ICD-10-CM

## 2015-06-12 DIAGNOSIS — C7931 Secondary malignant neoplasm of brain: Secondary | ICD-10-CM

## 2015-06-12 LAB — CBC WITH DIFFERENTIAL/PLATELET
BASO%: 0.1 % (ref 0.0–2.0)
BASOS ABS: 0 10*3/uL (ref 0.0–0.1)
EOS%: 0 % (ref 0.0–7.0)
Eosinophils Absolute: 0 10*3/uL (ref 0.0–0.5)
HCT: 41.5 % (ref 34.8–46.6)
HEMOGLOBIN: 13.6 g/dL (ref 11.6–15.9)
LYMPH%: 6.8 % — AB (ref 14.0–49.7)
MCH: 28 pg (ref 25.1–34.0)
MCHC: 32.8 g/dL (ref 31.5–36.0)
MCV: 85.4 fL (ref 79.5–101.0)
MONO#: 0.6 10*3/uL (ref 0.1–0.9)
MONO%: 3.5 % (ref 0.0–14.0)
NEUT#: 16.2 10*3/uL — ABNORMAL HIGH (ref 1.5–6.5)
NEUT%: 89.6 % — ABNORMAL HIGH (ref 38.4–76.8)
Platelets: 294 10*3/uL (ref 145–400)
RBC: 4.86 10*6/uL (ref 3.70–5.45)
RDW: 14.8 % — AB (ref 11.2–14.5)
WBC: 18.1 10*3/uL — AB (ref 3.9–10.3)
lymph#: 1.2 10*3/uL (ref 0.9–3.3)

## 2015-06-12 LAB — COMPREHENSIVE METABOLIC PANEL (CC13)
ALBUMIN: 3.6 g/dL (ref 3.5–5.0)
ALK PHOS: 108 U/L (ref 40–150)
ALT: 39 U/L (ref 0–55)
ANION GAP: 13 meq/L — AB (ref 3–11)
AST: 16 U/L (ref 5–34)
BUN: 13.9 mg/dL (ref 7.0–26.0)
CALCIUM: 10 mg/dL (ref 8.4–10.4)
CHLORIDE: 107 meq/L (ref 98–109)
CO2: 19 mEq/L — ABNORMAL LOW (ref 22–29)
Creatinine: 0.9 mg/dL (ref 0.6–1.1)
EGFR: 77 mL/min/{1.73_m2} — AB (ref >90)
Glucose: 194 mg/dl — ABNORMAL HIGH (ref 70–140)
POTASSIUM: 3.8 meq/L (ref 3.5–5.1)
SODIUM: 139 meq/L (ref 136–145)
Total Bilirubin: 0.48 mg/dL (ref 0.20–1.20)
Total Protein: 6.9 g/dL (ref 6.4–8.3)

## 2015-06-12 LAB — TECHNOLOGIST REVIEW

## 2015-06-12 MED ORDER — SODIUM CHLORIDE 0.9 % IV SOLN
Freq: Once | INTRAVENOUS | Status: AC
  Administered 2015-06-12: 11:00:00 via INTRAVENOUS
  Filled 2015-06-12: qty 8

## 2015-06-12 MED ORDER — HEPARIN SOD (PORK) LOCK FLUSH 100 UNIT/ML IV SOLN
500.0000 [IU] | Freq: Once | INTRAVENOUS | Status: AC | PRN
  Administered 2015-06-12: 500 [IU]
  Filled 2015-06-12: qty 5

## 2015-06-12 MED ORDER — DOCETAXEL CHEMO INJECTION 160 MG/16ML
75.0000 mg/m2 | Freq: Once | INTRAVENOUS | Status: AC
  Administered 2015-06-12: 150 mg via INTRAVENOUS
  Filled 2015-06-12: qty 15

## 2015-06-12 MED ORDER — SODIUM CHLORIDE 0.9 % IV SOLN
600.0000 mg/m2 | Freq: Once | INTRAVENOUS | Status: AC
  Administered 2015-06-12: 1180 mg via INTRAVENOUS
  Filled 2015-06-12: qty 59

## 2015-06-12 MED ORDER — SODIUM CHLORIDE 0.9 % IJ SOLN
10.0000 mL | INTRAMUSCULAR | Status: DC | PRN
  Administered 2015-06-12: 10 mL via INTRAVENOUS
  Filled 2015-06-12: qty 10

## 2015-06-12 MED ORDER — SODIUM CHLORIDE 0.9 % IJ SOLN
10.0000 mL | INTRAMUSCULAR | Status: DC | PRN
  Administered 2015-06-12: 10 mL
  Filled 2015-06-12: qty 10

## 2015-06-12 MED ORDER — HEPARIN SOD (PORK) LOCK FLUSH 100 UNIT/ML IV SOLN
500.0000 [IU] | Freq: Once | INTRAVENOUS | Status: AC
  Administered 2015-06-12: 500 [IU] via INTRAVENOUS
  Filled 2015-06-12: qty 5

## 2015-06-12 MED ORDER — SODIUM CHLORIDE 0.9 % IV SOLN
Freq: Once | INTRAVENOUS | Status: AC
  Administered 2015-06-12: 11:00:00 via INTRAVENOUS

## 2015-06-12 NOTE — Patient Instructions (Signed)
[place] Discharge Instructions for Patients Receiving Chemotherapy  Today you received the following chemotherapy agents taxotere, cytoxan  To help prevent nausea and vomiting after your treatment, we encourage you to take your nausea medication   If you develop nausea and vomiting that is not controlled by your nausea medication, call the clinic.   BELOW ARE SYMPTOMS THAT SHOULD BE REPORTED IMMEDIATELY:  *FEVER GREATER THAN 100.5 F  *CHILLS WITH OR WITHOUT FEVER  NAUSEA AND VOMITING THAT IS NOT CONTROLLED WITH YOUR NAUSEA MEDICATION  *UNUSUAL SHORTNESS OF BREATH  *UNUSUAL BRUISING OR BLEEDING  TENDERNESS IN MOUTH AND THROAT WITH OR WITHOUT PRESENCE OF ULCERS  *URINARY PROBLEMS  *BOWEL PROBLEMS  UNUSUAL RASH Items with * indicate a potential emergency and should be followed up as soon as possible.  Feel free to call the clinic you have any questions or concerns. The clinic phone number is [phone].  Please show the [facility] at check-in to the Emergency Department and triage nurse.  Docetaxel injection What is this medicine? DOCETAXEL (doe se TAX el) is a chemotherapy drug. It targets fast dividing cells, like cancer cells, and causes these cells to die. This medicine is used to treat many types of cancers like breast cancer, certain stomach cancers, head and neck cancer, lung cancer, and prostate cancer. This medicine may be used for other purposes; ask your health care provider or pharmacist if you have questions. What should I tell my health care provider before I take this medicine? They need to know if you have any of these conditions: -infection (especially a virus infection such as chickenpox, cold sores, or herpes) -liver disease -low blood counts, like low white cell, platelet, or red cell counts -an unusual or allergic reaction to docetaxel, polysorbate 80, other chemotherapy agents, other medicines, foods, dyes, or  preservatives -pregnant or trying to get pregnant -breast-feeding How should I use this medicine? This drug is given as an infusion into a vein. It is administered in a hospital or clinic by a specially trained health care professional. Talk to your pediatrician regarding the use of this medicine in children. Special care may be needed. Overdosage: If you think you have taken too much of this medicine contact a poison control center or emergency room at once. NOTE: This medicine is only for you. Do not share this medicine with others. What if I miss a dose? It is important not to miss your dose. Call your doctor or health care professional if you are unable to keep an appointment. What may interact with this medicine? -cyclosporine -erythromycin -ketoconazole -medicines to increase blood counts like filgrastim, pegfilgrastim, sargramostim -vaccines Talk to your doctor or health care professional before taking any of these medicines: -acetaminophen -aspirin -ibuprofen -ketoprofen -naproxen This list may not describe all possible interactions. Give your health care provider a list of all the medicines, herbs, non-prescription drugs, or dietary supplements you use. Also tell them if you smoke, drink alcohol, or use illegal drugs. Some items may interact with your medicine. What should I watch for while using this medicine? Your condition will be monitored carefully while you are receiving this medicine. You will need important blood work done while you are taking this medicine. This drug may make you feel generally unwell. This is not uncommon, as chemotherapy can affect healthy cells as well as cancer cells. Report any side effects. Continue your course of treatment even though you feel ill unless your doctor tells you to stop. In  some cases, you may be given additional medicines to help with side effects. Follow all directions for their use. Call your doctor or health care professional for  advice if you get a fever, chills or sore throat, or other symptoms of a cold or flu. Do not treat yourself. This drug decreases your body's ability to fight infections. Try to avoid being around people who are sick. This medicine may increase your risk to bruise or bleed. Call your doctor or health care professional if you notice any unusual bleeding. This medicine may contain alcohol in the product. You may get drowsy or dizzy. Do not drive, use machinery, or do anything that needs mental alertness until you know how this medicine affects you. Do not stand or sit up quickly, especially if you are an older patient. This reduces the risk of dizzy or fainting spells. Avoid alcoholic drinks. Do not become pregnant while taking this medicine. Women should inform their doctor if they wish to become pregnant or think they might be pregnant. There is a potential for serious side effects to an unborn child. Talk to your health care professional or pharmacist for more information. Do not breast-feed an infant while taking this medicine. What side effects may I notice from receiving this medicine? Side effects that you should report to your doctor or health care professional as soon as possible: -allergic reactions like skin rash, itching or hives, swelling of the face, lips, or tongue -low blood counts - This drug may decrease the number of white blood cells, red blood cells and platelets. You may be at increased risk for infections and bleeding. -signs of infection - fever or chills, cough, sore throat, pain or difficulty passing urine -signs of decreased platelets or bleeding - bruising, pinpoint red spots on the skin, black, tarry stools, nosebleeds -signs of decreased red blood cells - unusually weak or tired, fainting spells, lightheadedness -breathing problems -fast or irregular heartbeat -low blood pressure -mouth sores -nausea and vomiting -pain, swelling, redness or irritation at the injection  site -pain, tingling, numbness in the hands or feet -swelling of the ankle, feet, hands -weight gain Side effects that usually do not require medical attention (report to your prescriber or health care professional if they continue or are bothersome): -bone pain -complete hair loss including hair on your head, underarms, pubic hair, eyebrows, and eyelashes -diarrhea -excessive tearing -changes in the color of fingernails -loosening of the fingernails -nausea -muscle pain -red flush to skin -sweating -weak or tired This list may not describe all possible side effects. Call your doctor for medical advice about side effects. You may report side effects to FDA at 1-800-FDA-1088. Where should I keep my medicine? This drug is given in a hospital or clinic and will not be stored at home. NOTE: This sheet is a summary. It may not cover all possible information. If you have questions about this medicine, talk to your doctor, pharmacist, or health care provider.    2016, [employer]. (2014-07-21 16:04:57)  Cyclophosphamide injection What is this medicine? CYCLOPHOSPHAMIDE (sye kloe FOSS fa mide) is a chemotherapy drug. It slows the growth of cancer cells. This medicine is used to treat many types of cancer like lymphoma, myeloma, leukemia, breast cancer, and ovarian cancer, to name a few. This medicine may be used for other purposes; ask your health care provider or pharmacist if you have questions. What should I tell my health care provider before I take this medicine? They need to know if you  have any of these conditions: -blood disorders -history of other chemotherapy -infection -kidney disease -liver disease -recent or ongoing radiation therapy -tumors in the bone marrow -an unusual or allergic reaction to cyclophosphamide, other chemotherapy, other medicines, foods, dyes, or preservatives -pregnant or trying to get pregnant -breast-feeding How should I use this  medicine? This drug is usually given as an injection into a vein or muscle or by infusion into a vein. It is administered in a hospital or clinic by a specially trained health care professional. Talk to your pediatrician regarding the use of this medicine in children. Special care may be needed. Overdosage: If you think you have taken too much of this medicine contact a poison control center or emergency room at once. NOTE: This medicine is only for you. Do not share this medicine with others. What if I miss a dose? It is important not to miss your dose. Call your doctor or health care professional if you are unable to keep an appointment. What may interact with this medicine? This medicine may interact with the following medications: -amiodarone -amphotericin B -azathioprine -certain antiviral medicines for HIV or AIDS such as protease inhibitors (e.g., indinavir, ritonavir) and zidovudine -certain blood pressure medications such as benazepril, captopril, enalapril, fosinopril, lisinopril, moexipril, monopril, perindopril, quinapril, ramipril, trandolapril -certain cancer medications such as anthracyclines (e.g., daunorubicin, doxorubicin), busulfan, cytarabine, paclitaxel, pentostatin, tamoxifen, trastuzumab -certain diuretics such as chlorothiazide, chlorthalidone, hydrochlorothiazide, indapamide, metolazone -certain medicines that treat or prevent blood clots like warfarin -certain muscle relaxants such as succinylcholine -cyclosporine -etanercept -indomethacin -medicines to increase blood counts like filgrastim, pegfilgrastim, sargramostim -medicines used as general anesthesia -metronidazole -natalizumab This list may not describe all possible interactions. Give your health care provider a list of all the medicines, herbs, non-prescription drugs, or dietary supplements you use. Also tell them if you smoke, drink alcohol, or use illegal drugs. Some items may interact with your  medicine. What should I watch for while using this medicine? Visit your doctor for checks on your progress. This drug may make you feel generally unwell. This is not uncommon, as chemotherapy can affect healthy cells as well as cancer cells. Report any side effects. Continue your course of treatment even though you feel ill unless your doctor tells you to stop. Drink water or other fluids as directed. Urinate often, even at night. In some cases, you may be given additional medicines to help with side effects. Follow all directions for their use. Call your doctor or health care professional for advice if you get a fever, chills or sore throat, or other symptoms of a cold or flu. Do not treat yourself. This drug decreases your body's ability to fight infections. Try to avoid being around people who are sick. This medicine may increase your risk to bruise or bleed. Call your doctor or health care professional if you notice any unusual bleeding. Be careful brushing and flossing your teeth or using a toothpick because you may get an infection or bleed more easily. If you have any dental work done, tell your dentist you are receiving this medicine. You may get drowsy or dizzy. Do not drive, use machinery, or do anything that needs mental alertness until you know how this medicine affects you. Do not become pregnant while taking this medicine or for 1 year after stopping it. Women should inform their doctor if they wish to become pregnant or think they might be pregnant. Men should not father a child while taking this medicine and for 4 months  after stopping it. There is a potential for serious side effects to an unborn child. Talk to your health care professional or pharmacist for more information. Do not breast-feed an infant while taking this medicine. This medicine may interfere with the ability to have a child. This medicine has caused ovarian failure in some women. This medicine has caused reduced sperm  counts in some men. You should talk with your doctor or health care professional if you are concerned about your fertility. If you are going to have surgery, tell your doctor or health care professional that you have taken this medicine. What side effects may I notice from receiving this medicine? Side effects that you should report to your doctor or health care professional as soon as possible: -allergic reactions like skin rash, itching or hives, swelling of the face, lips, or tongue -low blood counts - this medicine may decrease the number of white blood cells, red blood cells and platelets. You may be at increased risk for infections and bleeding. -signs of infection - fever or chills, cough, sore throat, pain or difficulty passing urine -signs of decreased platelets or bleeding - bruising, pinpoint red spots on the skin, black, tarry stools, blood in the urine -signs of decreased red blood cells - unusually weak or tired, fainting spells, lightheadedness -breathing problems -dark urine -dizziness -palpitations -swelling of the ankles, feet, hands -trouble passing urine or change in the amount of urine -weight gain -yellowing of the eyes or skin Side effects that usually do not require medical attention (report to your doctor or health care professional if they continue or are bothersome): -changes in nail or skin color -hair loss -missed menstrual periods -mouth sores -nausea, vomiting This list may not describe all possible side effects. Call your doctor for medical advice about side effects. You may report side effects to FDA at 1-800-FDA-1088. Where should I keep my medicine? This drug is given in a hospital or clinic and will not be stored at home. NOTE: This sheet is a summary. It may not cover all possible information. If you have questions about this medicine, talk to your doctor, pharmacist, or health care provider.    2016, [employer]. (2012-05-18 16:22:58)

## 2015-07-03 ENCOUNTER — Other Ambulatory Visit (HOSPITAL_BASED_OUTPATIENT_CLINIC_OR_DEPARTMENT_OTHER): Payer: Medicare HMO

## 2015-07-03 ENCOUNTER — Ambulatory Visit: Payer: Medicare HMO

## 2015-07-03 ENCOUNTER — Ambulatory Visit (HOSPITAL_BASED_OUTPATIENT_CLINIC_OR_DEPARTMENT_OTHER): Payer: Medicare HMO | Admitting: Hematology

## 2015-07-03 ENCOUNTER — Ambulatory Visit (HOSPITAL_BASED_OUTPATIENT_CLINIC_OR_DEPARTMENT_OTHER): Payer: Medicare HMO

## 2015-07-03 ENCOUNTER — Encounter: Payer: Self-pay | Admitting: Hematology

## 2015-07-03 VITALS — BP 136/73 | HR 91 | Temp 98.0°F | Resp 18 | Ht 64.0 in | Wt 186.1 lb

## 2015-07-03 DIAGNOSIS — Z5111 Encounter for antineoplastic chemotherapy: Secondary | ICD-10-CM | POA: Diagnosis not present

## 2015-07-03 DIAGNOSIS — C50211 Malignant neoplasm of upper-inner quadrant of right female breast: Secondary | ICD-10-CM

## 2015-07-03 DIAGNOSIS — Z85038 Personal history of other malignant neoplasm of large intestine: Secondary | ICD-10-CM

## 2015-07-03 DIAGNOSIS — C7931 Secondary malignant neoplasm of brain: Secondary | ICD-10-CM

## 2015-07-03 DIAGNOSIS — C186 Malignant neoplasm of descending colon: Secondary | ICD-10-CM

## 2015-07-03 DIAGNOSIS — G620 Drug-induced polyneuropathy: Secondary | ICD-10-CM | POA: Diagnosis not present

## 2015-07-03 DIAGNOSIS — I10 Essential (primary) hypertension: Secondary | ICD-10-CM

## 2015-07-03 DIAGNOSIS — Z95828 Presence of other vascular implants and grafts: Secondary | ICD-10-CM

## 2015-07-03 DIAGNOSIS — Z170 Estrogen receptor positive status [ER+]: Secondary | ICD-10-CM | POA: Diagnosis not present

## 2015-07-03 LAB — CBC WITH DIFFERENTIAL/PLATELET
BASO%: 0.1 % (ref 0.0–2.0)
BASOS ABS: 0 10*3/uL (ref 0.0–0.1)
EOS ABS: 0 10*3/uL (ref 0.0–0.5)
EOS%: 0 % (ref 0.0–7.0)
HEMATOCRIT: 39.4 % (ref 34.8–46.6)
HEMOGLOBIN: 13 g/dL (ref 11.6–15.9)
LYMPH#: 1.5 10*3/uL (ref 0.9–3.3)
LYMPH%: 8.4 % — ABNORMAL LOW (ref 14.0–49.7)
MCH: 27.7 pg (ref 25.1–34.0)
MCHC: 33 g/dL (ref 31.5–36.0)
MCV: 83.8 fL (ref 79.5–101.0)
MONO#: 0.6 10*3/uL (ref 0.1–0.9)
MONO%: 3.5 % (ref 0.0–14.0)
NEUT#: 15.2 10*3/uL — ABNORMAL HIGH (ref 1.5–6.5)
NEUT%: 88 % — ABNORMAL HIGH (ref 38.4–76.8)
PLATELETS: 263 10*3/uL (ref 145–400)
RBC: 4.7 10*6/uL (ref 3.70–5.45)
RDW: 15.2 % — AB (ref 11.2–14.5)
WBC: 17.2 10*3/uL — ABNORMAL HIGH (ref 3.9–10.3)

## 2015-07-03 LAB — COMPREHENSIVE METABOLIC PANEL
ALT: 33 U/L (ref 0–55)
ANION GAP: 11 meq/L (ref 3–11)
AST: 19 U/L (ref 5–34)
Albumin: 3.6 g/dL (ref 3.5–5.0)
Alkaline Phosphatase: 102 U/L (ref 40–150)
BILIRUBIN TOTAL: 0.54 mg/dL (ref 0.20–1.20)
BUN: 15 mg/dL (ref 7.0–26.0)
CO2: 22 meq/L (ref 22–29)
CREATININE: 0.9 mg/dL (ref 0.6–1.1)
Calcium: 9.9 mg/dL (ref 8.4–10.4)
Chloride: 109 mEq/L (ref 98–109)
EGFR: 79 mL/min/{1.73_m2} — ABNORMAL LOW (ref >90)
GLUCOSE: 112 mg/dL (ref 70–140)
Potassium: 3.7 mEq/L (ref 3.5–5.1)
Sodium: 142 mEq/L (ref 136–145)
TOTAL PROTEIN: 7.1 g/dL (ref 6.4–8.3)

## 2015-07-03 MED ORDER — HEPARIN SOD (PORK) LOCK FLUSH 100 UNIT/ML IV SOLN
500.0000 [IU] | Freq: Once | INTRAVENOUS | Status: AC | PRN
  Administered 2015-07-03: 500 [IU]
  Filled 2015-07-03: qty 5

## 2015-07-03 MED ORDER — DOCETAXEL CHEMO INJECTION 160 MG/16ML
75.0000 mg/m2 | Freq: Once | INTRAVENOUS | Status: AC
  Administered 2015-07-03: 150 mg via INTRAVENOUS
  Filled 2015-07-03: qty 15

## 2015-07-03 MED ORDER — NYSTATIN 100000 UNIT/ML MT SUSP: 5.0000 mL | Freq: Four times a day (QID) | OROMUCOSAL | Status: DC

## 2015-07-03 MED ORDER — SODIUM CHLORIDE 0.9 % IV SOLN
Freq: Once | INTRAVENOUS | Status: AC
  Administered 2015-07-03: 12:00:00 via INTRAVENOUS
  Filled 2015-07-03: qty 8

## 2015-07-03 MED ORDER — SODIUM CHLORIDE 0.9 % IV SOLN
600.0000 mg/m2 | Freq: Once | INTRAVENOUS | Status: AC
  Administered 2015-07-03: 1180 mg via INTRAVENOUS
  Filled 2015-07-03: qty 59

## 2015-07-03 MED ORDER — SODIUM CHLORIDE 0.9 % IV SOLN
Freq: Once | INTRAVENOUS | Status: AC
  Administered 2015-07-03: 12:00:00 via INTRAVENOUS

## 2015-07-03 MED ORDER — SODIUM CHLORIDE 0.9 % IJ SOLN
10.0000 mL | INTRAMUSCULAR | Status: DC | PRN
  Administered 2015-07-03: 10 mL
  Filled 2015-07-03: qty 10

## 2015-07-03 MED ORDER — SODIUM CHLORIDE 0.9 % IJ SOLN
10.0000 mL | INTRAMUSCULAR | Status: DC | PRN
  Administered 2015-07-03: 10 mL via INTRAVENOUS
  Filled 2015-07-03: qty 10

## 2015-07-03 NOTE — Progress Notes (Signed)
Hematology and Oncology Follow Up Visit Date of Visit: 07/03/2015   Kayla [name] [id number] 02-Jan-1951 64 y.o. 07/03/2015 PCP: [place] [phone])  Principle Diagnosis:  1. Colon cancer, T4N1M1, Stage IV, diagnosed on 10/27/2008, brain recurrence in 03/2011  2. Stage IIA right breast cancer, diagnosed in 02/2015  Oncology History   Breast cancer of upper-inner quadrant of right female breast [facility])   Staging form: Breast, AJCC 7th Edition     Clinical: Stage IIA (T2, N0, M0) - Unsigned     Pathologic stage from 04/14/2015: Stage IIA (T2, N0, cM0) - Signed by Truitt Merle, MD on 05/05/2015 Cancer of left colon [facility])   Staging form: Colon and Rectum, AJCC 7th Edition     Pathologic: T4a, N1a, M1 - Unsigned        Breast cancer of upper-inner quadrant of right female breast ([place])   02/27/2015 Mammogram Diagnostic mammogram Showed a 2.5 cm irregular mass within the far posterior upper inner right breast, ultrasound confirmed a 1.9 x 1.0 x 0.8 cm mass at 1:30 o'clock 15 submitted from nipple. No axillary adenopathy   03/03/2015 Initial Diagnosis Breast cancer of upper-inner quadrant of right female breast   03/03/2015 Initial Biopsy Right breast needle biopsy showed invasive ductal carcinoma, grade 1-2.   03/03/2015 Receptors her2 ER 100%+, PR 80%+, ki67 15%   04/14/2015 Surgery right breast lumpectomy and sentinel lymph node biopsy. Surgical margins were negative.   04/14/2015 Pathology Results right breast invasive ductal carcinoma, grade 2, 2.7 cm,(+) DCIS, margins were negative, 4 sentinel lymph nodes and one axillary lymph nodes were negative, (+)lymphovascular invasion.   04/14/2015 Oncotype testing RS 30, which predicts 10-year risk of distance recurrence with tamoxifen alone 20%, intermediate risk   05/21/2015 -  Adjuvant Chemotherapy  Adjuvant chemotherapy with docetaxel 75 mg/m, and Cytoxan 600 mg/m, every 3 weeks, starting 05/21/2015, planning for total 4  cycles   05/26/2015 - 05/29/2015 Hospital Admission Pt was admitted for UTI ans syncope, treated with antibiotics and IVF    Oncology History: Diagnosis of colon cancer dates back to 10/27/2008 when Kayla [name] presented with what turned out to be a bowel perforation through tumor. Stage at that time was T4 N1 M1 with pulmonary metastatic disease. The K-ras mutation was detected. Kayla [name] underwent surgical resection of her tumor with a colostomy at the time of her surgery on 10/27/2008. The colostomy has been subsequently reversed on 09/20/2010. Kayla [name] received chemotherapy consisting of FOLFOX for 10 treatments from 12/10/2008 through 06/02/2009. She achieved a partial remission from these treatments. She then received additional chemotherapy with 5-FU, leucovorin, and 5-FU by continuous infusion along with Avastin from 06/23/2009 through 11/17/2009. 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 She was doing well without evidence of disease until she developed a right hemiparesthesia in September 2012 and was found to have a 3 x 3 x 2.4 cm, lobulated, enhancing mass in the left parietal lobe with marked surrounding edema. A PET scan on 03/25/2011 showed resolution of the previously identified pulmonary nodules with no residual hypermetabolic activity. The pericardial effusion, which has been present since diagnosis, was unchanged. There were also uterine fibroids and a low-density lesion in the anterior spleen that was stable and not associated with any hypermetabolic activity. Of note, Kayla [name] had a markedly elevated CEA up to 24.7 on 03/23/2011. On 06/23/2011, the CEA was less than 0.5. Dr. Erline Levine resected the recurrent metastatic colon cancer on 03/31/2011. This was followed by stereotactic radiation on  04/15/2011. The patient underwent a left-sided craniotomy and excision of the mass involving her left parietal lobe on 11/10/2011 by Dr. Erline Levine. The pathology report was  negative for any malignancy. Pathology report indicated benign brain with fibrosis, hemorrhage, hemosiderin deposition, abundant dystrophic calcifications, and mixed acute and chronic inflammation including foreign body multinucleated giant cells.    Prior Therapy:  Kayla [name] underwent surgical resection of her tumor with a colostomy at the time of her surgery on 10/27/2008. The colostomy has been subsequently reversed on 09/20/2010. Kayla [name] received chemotherapy consisting of FOLFOX for 10 treatments from 12/10/2008 through 06/02/2009. She achieved a partial remission from these treatments. She then received additional chemotherapy with 5-FU, leucovorin, and 5-FU by continuous infusion along with Avastin from 06/23/2009 through 11/17/2009. 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   Current therapy:  Adjuvant chemotherapy with docetaxel 75 mg/m, and Cytoxan 600 mg/m, every 3 weeks, started on 05/21/2015, planning for total 4 cycles  Interim History:  Kayla [name] returns for follow-up and third cycle of chemotherapy. She has been tolerating chemotherapy well, mild fatigue and low appetite, recovers quickly. No other side effects. She has good appetite and eats well, weight is stable, no fever or chills.   Past Medical History  Diagnosis Date  . Pericardial effusion     echo 08/13/09  . LVH (left ventricular hypertrophy)     mod/severe. echo 1/11. EF 65-70%   . Hypertension   . Thrombocytopenia ([place])   . 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 Allergy     sulfa  . Colon cancer ([place])     colon/ 2010/surg/chemo  . History of radiation therapy 04/15/11    17 Gy single fraction  l parietal brain metastais  . Heart murmur   . Peripheral vascular disease ([employer])   . Shortness of breath   . Blood transfusion   . GERD (gastroesophageal reflux disease)   . Headache(784.0)   . Neuromuscular disorder ([place])     peripheral neuropathy feet  . Arthritis   .  Diverticulosis 07/22/2010    sigmoid colon  . Family history of breast cancer   . Family history of colon cancer    GYN HISTORY  Menarchal: 14 LMP: 87 Contraceptive: a few years  HRT: no  G2P2:    Medications: I have reviewed the patient's current medications.  Current Outpatient Prescriptions  Medication Sig Dispense Refill  . acetaminophen (TYLENOL) 500 MG tablet Take 1,000 mg by mouth every 6 (six) hours as needed for mild pain, moderate pain or headache.     . dexamethasone (DECADRON) 4 MG tablet Take 2 tablets (8 mg total) by mouth 2 (two) times daily. Start the day before Taxotere. Then again the day after chemo for 3 days. 30 tablet 1  . fluticasone (FLONASE) 50 MCG/ACT nasal spray Place 1 spray into both nostrils daily as needed for allergies.     [name] gabapentin (NEURONTIN) 300 MG capsule TAKE ONE CAPSULE BY MOUTH AT BEDTIME (Patient taking differently: TAKE 300 MG BY MOUTH AT BEDTIME) 90 capsule 0  . lisinopril (PRINIVIL,ZESTRIL) 40 MG tablet Take 1 tablet (40 mg total) by mouth daily. 90 tablet 0  . ondansetron (ZOFRAN) 8 MG tablet Take 1 tablet (8 mg total) by mouth 2 (two) times daily. Start the day after chemo for 3 days. Then take as needed for nausea or vomiting. 30 tablet 1  . oxyCODONE (OXY IR/ROXICODONE) 5 MG immediate release tablet  Take 1-2 tablets (5-10 mg total) by mouth every 4 (four) hours as needed for moderate pain, severe pain or breakthrough pain. 40 tablet 0  . nystatin (MYCOSTATIN) 100000 UNIT/ML suspension Take 5 mLs (500,000 Units total) by mouth 4 (four) times daily. 180 mL 1   No current facility-administered medications for this visit.   Facility-Administered Medications Ordered in Other Visits  Medication Dose Route Frequency Provider Last Rate Last Dose  . cyclophosphamide (CYTOXAN) 1,180 mg in sodium chloride 0.9 % 250 mL chemo infusion  600 mg/m2 (Treatment Plan Actual) Intravenous Once Truitt Merle, MD      . DOCEtaxel (TAXOTERE) 150 mg in dextrose 5 %  250 mL chemo infusion  75 mg/m2 (Treatment Plan Actual) Intravenous Once Truitt Merle, MD 265 mL/hr at 07/03/15 1248 150 mg at 07/03/15 1248  . heparin lock flush 100 unit/mL  500 Units Intracatheter Once PRN Truitt Merle, MD      . sodium chloride 0.9 % injection 10 mL  10 mL Intracatheter PRN Truitt Merle, MD       Allergies:  Allergies  Allergen Reactions  . Sulfonamide Derivatives Rash    Past Medical History, Surgical history, Social history, and Family History were reviewed and updated.  SMOKING HISTORY:  The patient has smoked intermittently, about 2 packs of cigarettes per week, since she was a teenager, but stopped smoking in 2010.  Review of Systems: Constitutional:  Negative for fever, chills, night sweats, anorexia, weight loss, pain. Cardiovascular: no chest pain or dyspnea on exertion Respiratory: no cough, shortness of breath, or wheezing Neurological: no TIA or stroke symptoms Dermatological: negative for rash ENT: negative for - epistaxis, headaches, tinnitus, vertigo or visual changes Skin: Negative. Gastrointestinal: no abdominal pain, change in bowel habits, or black or bloody stools positive for - heartburn Genito-Urinary: no dysuria, trouble voiding, or hematuria Hematological and Lymphatic: negative for - bleeding problems, fatigue, jaundice or pallor Breast: negative for breast lumps Musculoskeletal: positive for - muscular weakness and pins and needles with some numbness (R>L) in lower extremities negative for - gait disturbance Remaining ROS negative.  Physical Exam: Blood pressure 136/73, pulse 91, temperature 98 F (36.7 C), temperature source Oral, resp. rate 18, height 5' 4"  (1.626 m), weight 186 lb 1.6 oz (84.414 kg), SpO2 98 %. ECOG: 0 General appearance: alert, cooperative, appears stated age, no distress and mildly obese Head: Normocephalic, without obvious abnormality, atraumatic Neck: no adenopathy, supple, symmetrical, trachea midline and thyroid not  enlarged, symmetric, no tenderness/mass/nodules HEENT: PERRLA; EOMi; No sclerae icterus. OP clear of masses.  Lymph nodes: Cervical, supraclavicular, and axillary nodes normal. Heart:regular rate and rhythm, S1, S2 normal, no murmur, click, rub or gallop Lung:chest clear, no wheezing, rales, normal symmetric air entry,  + R sided port-a-cath. Abdomin: soft, non-tender, without masses or organomegaly, normal bowel sounds and surgical scars well healed. EXT:No peripheral edema.  R>L decreased sensation and mildly decreased strength (noted in prior exams) Neuro: R lower extremity weakness and decreased sensation.  Normal gait.  Good finger to nose bilaterally. Otherwise no focal deficits. Breasts: Breast inspection showed them to be symmetrical with no nipple discharge. The surgical incision in right breast and axilla are well-healed, mild tenderness. Palpation of the breasts and axilla revealed no obvious mass that I could appreciate.    Lab Results: CBC Latest Ref Rng 07/03/2015 06/12/2015 06/02/2015  WBC 3.9 - 10.3 10e3/uL 17.2(H) 18.1(H) 6.0  Hemoglobin 11.6 - 15.9 g/dL 13.0 13.6 13.2  Hematocrit 34.8 - 46.6 % 39.4 41.5  40.3  Platelets 145 - 400 10e3/uL 263 294 272    CMP Latest Ref Rng 07/03/2015 06/12/2015 06/02/2015  Glucose 70 - 140 mg/dl 112 194(H) 135  BUN 7.0 - 26.0 mg/dL 15.0 13.9 10.1  Creatinine 0.6 - 1.1 mg/dL 0.9 0.9 0.9  Sodium 136 - 145 mEq/L 142 139 142  Potassium 3.5 - 5.1 mEq/L 3.7 3.8 3.8  Chloride 101 - 111 mmol/L - - -  CO2 22 - 29 mEq/L 22 19(L) 25  Calcium 8.4 - 10.4 mg/dL 9.9 10.0 9.3  Total Protein 6.4 - 8.3 g/dL 7.1 6.9 6.7  Total Bilirubin 0.20 - 1.20 mg/dL 0.54 0.48 0.52  Alkaline Phos 40 - 150 U/L 102 108 117  AST 5 - 34 U/L 19 16 23   ALT 0 - 55 U/L 33 39 41   CEA  Status: Finalresult Visible to patient:  Not Released Nextappt: 08/11/2014 at 09:40 AM in Radiology (AP-MM 1) Dx:  Metastatic cancer to brain           Ref Range 773mo ago  68mogo  73m173moo  58yr33yr     CEA 0.0 - 5.0 ng/mL 0.9 1.3 1.4 1.1       PATHOLOGY REPORT Diagnosis 04/14/2015 1. Breast, lumpectomy, right - INVASIVE GRADE II DUCTAL CARCINOMA, SPANNING 2.7 CM IN GREATEST DIMENSION. - ASSOCIATED INTERMEDIATE GRADE DUCTAL CARCINOMA IN SITU. - LYMPH/VASCULAR INVASION IS IDENTIFIED. - MARGINS ARE NEGATIVE. - SEE ONCOLOGY TEMPLATE. 2. Lymph node, sentinel, biopsy, right axillary - ONE BENIGN LYMPH NODE WITH NO TUMOR SEEN (0/1). 3. Lymph node, sentinel, biopsy, right axillary - ONE BENIGN LYMPH NODE WITH NO TUMOR SEEN (0/1). 4. Lymph node, sentinel, biopsy, right axillary - ONE BENIGN LYMPH NODE WITH NO TUMOR SEEN (0/1). 5. Lymph node, biopsy, right axillary - ONE BENIGN LYMPH NODE WITH NO TUMOR SEEN (0/1). Microscopic Comment 1. BREAST, INVASIVE TUMOR, WITH LYMPH NODES PRESENT Specimen, including laterality and lymph node sampling (sentinel, non-sentinel): Right partial breast with right sentinel lymph node sampling. Procedure: Right breast lumpectomy with right sentinel lymph node biopsies. Histologic type: Invasive ductal carcinoma. Grade: 2. Tubule formation: 3. Nuclear pleomorphism: 2. Mitotic: 1. Tumor size (gross measurement): 2.7 cm. Margins: Invasive, distance to closest margin: 0.2 cm (anterior margin). In-situ, distance to closest margin: At least 0.4 cm (all margins). Lymphovascular invasion: Yes, lymph/vascular invasion is identified. Ductal carcinoma in situ: Yes. Grade: Intermediate grade. Extensive intraductal component: No. Lobular neoplasia: Not identified. Tumor focality: Unifocal. Treatment effect: Not applicable. Extent of tumor: Tumor confined to breast parenchyma. Skin: Not received. Nipple: Not received. Skeletal muscle: Not received. Lymph nodes Examined: 3 Sentinel. 1 Non-sentinel. 4 Total. Lymph nodes with metastasis: 0. Isolated tumor cells (< 0.2 mm): 0. Micrometastasis: (> 0.2 mm and < 2.0 mm):  0. Macrometastasis: (> 2.0 mm): 0. Extracapsular extension: Not applicable. Breast prognostic profile: Performed on previous case [id number] receptor: 100%, positive. Progesterone receptor: 80%, positive. Her-2 neu: 1.30 ratio, negative. Ki-67: 15%. Non-neoplastic breast: Unremarkable. TNM: pT2, pN0. Comments: As Her-2 neu was previously negative, this will be repeated on representative tumor from the current specimen, and will be reported in an addendum to follow. (RH:ds 04/15/15)  Results: HER2 - NEGATIVE RATIO OF HER2/CEP17 SIGNALS 1.17 AVERAGE HER2 COPY NUMBER PER CELL 2.05   Radiological Studies: 1. MRI of the head with and without IV contrast on 03/23/2011 showed a solitary enhancing mass in the left parietal lobe with surrounding white matter edema, most compatible with solitary metastatic deposit. The mass  lesion measured 2.7 x 2.5 cm.  2. PET scan from 03/25/2011 showed resolution of the previously- identified pulmonary nodules with no residual hypermetabolic activity noted in the neck, chest, abdomen or pelvis to suggest active malignancy. There was a pericardial effusion and some subsegmental atelectasis in the lower lobes. There were also uterine fibroids and a low-density lesion in the anterior spleen that was stable and not associated with hypermetabolic activity.  3. MRI of the head with and without IV contrast showed Stealth protocol utilized to evaluate left parietal mass. The mass measured 3 x 3 x 2.4 cm with marked surrounding vasogenic edema.  4. MRI of the head with and without IV contrast on 04/08/2011 showed postsurgical changes of tumor removal in the left parietal lobe and a postsurgical hematoma with enhancement. There was 3 mm of midline shift. No other metastatic deposits.  5. MRI of the head with and without IV contrast on 06/28/2011 showed a 3 cm area of restricted diffusion lying within the previous area of  left parietal surgical resection for  colon cancer. There was enhancement of the peripheral aspect of the cavity, marked restricted diffusion and increasing vasogenic edema. There was concern about the development of a brain abscess.  6. MRI of the head with and without IV contrast on 07/29/2011 showed left parietal postoperative hematoma was smaller compared with the  prior study. There was no nodular enhancement seen to indicate tumor. No new enhancing lesions were seen. There was significant  improvement in the left parietal edema.  7. Digital screening mammogram was negative on 08/02/2011.  8. An MRI of the head with and without IV contrast on 10/21/2011 showed development of gyriform enhancement circumferentially along the margins of the left parietal postoperative space/hematoma. Marked increase in vasogenic edema was seen. 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 There was further contraction of the hematoma/postoperative space in the left parietal cortical region, now measuring 17 x 19 mm in transverse diameter as opposed to 22 x 21 mm previously.  Left-to-right shift was 2 mm.  9. Nuclear medicine brain PET-CT scan carried out on 10/27/2011 showed hypermetabolic activity in the high left parietal lobe which corresponds to gyriform enhancement on comparison MRI from 10/21/2011. This was felt to be most consistent with recurrent  tumor.  10. Chest x-ray, 2 view from 11/04/2011, showed chronic cardiomegaly. Port-A-Cath was in place. No acute disease was present.  11. MRI of the head with and without IV contrast on 01/27/2012 showed findings that were felt to be consistent with progression of disease. This exam was compared with the MRI of 10/21/2011. It was recognized that the patient underwent surgical resection of the mass on 11/10/2011.  There was felt to be on the present exam residual peripheral enhancement of approximately 35 x 40 x 21 mm with infiltration of the cortex and  deep white matter. It was felt that the enhancement had progressed, despite removal of the central focus. There was moderate vasogenic  edema throughout the left hemisphere but slightly decreased from the MRI from 10/21/2011. No new lesions were seen. There was mild atrophy with chronic microvascular ischemic change, but no midline shift. Major intracranial vascular structures were patent.  12. MRI of the brain with and without IV contrast on 05/04/2012 showed previous left parietal lobe surgery for resection of tumor and radiation necrosis.  No new abnormalities were noted. 13. Chest x-ray, 2 view, on 07/03/2012 showed enlargement of the cardiac silhouette  with some lingular scarring, as compared with the chest x-ray from 11/04/2011. 14. 2-D echocardiogram from 07/12/2012 showed left ventricular ejection fraction of 55-60%.  There was a small to moderate circumferential pericardial effusion with no signs of tamponade.  There was felt to be no significant change from the 2-D echocardiogram dated 08/13/2009. 15. Digital bilateral screening mammogram on 08/06/2012 was negative. 16. 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  There was no progression or new brain metastasis identified. 17. PET scan from 10/16/2012 showed no specific features to suggest metastatic disease.  There was diffuse uptake throughout the thyroid gland, possibly due to hyperthyroidism. 18. MRI of the brain with and without IV contrast on 03/12/2013 showed residual gyriform enhancement surrounding the area of previous surgical resection of a left parietal metastasis with subsequent resection of radionecrosis. The findings likely represent some residual brain injury, but there is no significant progression of radionecrosis and no new lesions seen. 19. MRI of the brain with and without IV  contrast on 11/01/2013 showed Stable posttherapy appearance of the brain since May of 2014. No progression or new brain metastasis identified. 20. CT chest,abomen and pelvis with contrast. 1. No evidence of thoracic metastasis.  2. Small prevascular lymph node is similar prior. 3. Stable pericardial effusion. 4. Interval increase in the volume presacral lymph node in the upper pelvis. This has progressed slowly from 2011. Recommend attention on follow-up versus restaging FDG PET scan. 5. Stable anastomosis in the descending colon. 21. CT chest, abdomen and pelvis with contrast 07/126/2015. 1. No evidence of local colon cancer recurrence or metastasis within the abdomen or pelvis. 2. Retention of stool at the colocolonic anastomosis in the left colon is unchanged comparison exam. No obstructing lesion identified.  22. Mesenteric lymph node is again demonstrated with no increased size. 4. Stable splenic lesion likely representing a complex cysts or hemangioma. 5. Stable small pericardial effusion 23. Brain MRI 11/11/2014: stable and satisfactory posttherapy appearance of the brain since 2014 and 2015. No new brain metastasis identified. 24. CT chest, abdomen and pelvis w contrast 02/02/2015: 1. No new or progressive findings to suggest recurrent colorectal, cancer in the chest, abdomen, or pelvis. 2. 16 mm soft tissue nodule in the medial right breast. Correlation with mammographic history recommended. 3. Fibroid change in the uterus. 4. Stable hypodense lesion in the anterior spleen, compatible with complex cyst or hemangioma. 5. Stable appearance of the trace pericardial effusion.   Impression and Plan: 64 year old African-American female, postmenopausal  1. Right breat invasive ductal carcinoma, pT2N0M0, stage IIA, ER+/PR+, HER2-, Oncotype RS 30 -I reviewed her surgical pathology findings with her in great details. -I reviewed her Oncotype DX test results. The recurrence score is 30, which predicts 10  year risk of distant recurrence 20% with tamoxifen alone. This is considered intermediate risk, but close to high risk group. -. Her recurrence score is very close to high risk group, and she is in good health overall, I recommend her to have adjuvant chemotherapy, with docetaxel and Cytoxan, intravenously every 3 weeks, for total of 4 cycles.  -The goal of treatment is curative. -Given this strong ER/PR positivity, I recommend adjuvant endocrine therapy with aromatase inhibitor, after she completes breast radiation. -She would benefit from adjuvant breast radiation also. This will be followed up by her radiation oncologist Dr. Tammi Klippel -She is tolerating chemotherapy well. Lab reviewed, adequate for  treatment, we'll proceed cycle 3 chemotherapy today.   2.  Episode of syncope  On 05/26/2015 - likely secondary to dehydration, she did have mild acute renal failure on that time. - resolved now. I strongly encouraged her to drink more fluids after her next chemotherapy.   3. HTN - her lisinopril has been held since her recent hospitalization for syncope - her problem is normal today. I'll continue hold her lisinopril,  And asked her to check her blood pressure frequently, and restarted  Lisinopril if her blood pressure  Persistently above 140/90.  4. Stage IV Colon Cancer mets to lung and brain, NED --Jirah continues to do well with no evidence for disease recurrence.  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   -She is clinically doing very well. Her CEA level has been normal.  - her last brain MRI in August 2016  Was negative for recurrenc -her recent restaging CT scans in 01/2015 showed no evidence of recurrence - she is clinically doing well, physical exam is unremarkable, we'll continue observation. -follow up with lab CBC, CMP and CEA every 3 months.  5. Peripheral neuropathy, grade 1, secondary to prior  chemotherapy  -stable  6 Genetics -Given her positive family and personal history of breast (Mother at age of 66 and cousin) and colon cancer (cousin), she was referred to genetic counselor -Her genetic test was negative  She will continue follow-up with her primary care physician for other medical problems  Plan -cycle 3 TC today -RTC in 3 weeks for cycle 4 chemo (last cycle)  I spent 15 minutes counseling the patient face to face. The total time spent in the appointment was 20 minutes.   Truitt Merle  07/03/2015

## 2015-07-03 NOTE — Patient Instructions (Signed)
[facility] Discharge Instructions for Patients Receiving Chemotherapy  Today you received the following chemotherapy agents :  Taxotere and Cytoxan.  To help prevent nausea and vomiting after your treatment, we encourage you to take your nausea medication: zofran 8 mg every 12 hours as needed.   If you develop nausea and vomiting that is not controlled by your nausea medication, call the clinic.   BELOW ARE SYMPTOMS THAT SHOULD BE REPORTED IMMEDIATELY:  *FEVER GREATER THAN 100.5 F  *CHILLS WITH OR WITHOUT FEVER  NAUSEA AND VOMITING THAT IS NOT CONTROLLED WITH YOUR NAUSEA MEDICATION  *UNUSUAL SHORTNESS OF BREATH  *UNUSUAL BRUISING OR BLEEDING  TENDERNESS IN MOUTH AND THROAT WITH OR WITHOUT PRESENCE OF ULCERS  *URINARY PROBLEMS  *BOWEL PROBLEMS  UNUSUAL RASH Items with * indicate a potential emergency and should be followed up as soon as possible.  Feel free to call the clinic you have any questions or concerns. The clinic phone number is [phone].  Please show the [place] at check-in to the Emergency Department and triage nurse.

## 2015-07-09 ENCOUNTER — Encounter: Payer: Self-pay | Admitting: Radiation Therapy

## 2015-07-09 ENCOUNTER — Other Ambulatory Visit: Payer: Self-pay | Admitting: Radiation Therapy

## 2015-07-09 DIAGNOSIS — C7949 Secondary malignant neoplasm of other parts of nervous system: Principal | ICD-10-CM

## 2015-07-09 DIAGNOSIS — C7931 Secondary malignant neoplasm of brain: Secondary | ICD-10-CM

## 2015-07-09 NOTE — Progress Notes (Signed)
1.  Do you need a wheel chair?    no  2. On oxygen? no  3. Have you ever had any surgery in the body part being scanned?  Yes, craniotomy 11/10/11  Here at [facility] by Dr. Vertell Limber   4. Have you ever had any surgery on your brain or heart? Brain, see above                        5. Have you ever had surgery on your eyes or ears?     no                                6. Do you have a pacemaker or defibrillator?   no  7. Do you have a Neurostimulator?   no  8. Claustrophobic?  no  9. Any risk for metal in eyes?  no  10. Injury by bullet, buckshot, or shrapnel?  no  11. Stent?   no                                                                                                                12. Hx of Cancer?   Yes, history of colon cancer met to brain and also a recent diagnosis of breast cancer                                                                                                             13. Kidney or Liver disease?   no  14. Hx of Lupus, Rheumatoid Arthritis or Scleroderma? no  15. IV Antibiotics or long term use of NSAIDS?  no  16. HX of Hypertension?  yes  17. Diabetes?  no  18. Allergy to contrast?  no  19. Recent labs.  Yes, Drawn 07/03/15 - In [facility]   Questions answered over the phone on 07/09/15  Mont Dutton  Special Procedures Navigator

## 2015-07-24 ENCOUNTER — Other Ambulatory Visit (HOSPITAL_BASED_OUTPATIENT_CLINIC_OR_DEPARTMENT_OTHER): Payer: Medicare HMO

## 2015-07-24 ENCOUNTER — Ambulatory Visit (HOSPITAL_BASED_OUTPATIENT_CLINIC_OR_DEPARTMENT_OTHER): Payer: Medicare HMO

## 2015-07-24 ENCOUNTER — Encounter: Payer: Self-pay | Admitting: Hematology

## 2015-07-24 ENCOUNTER — Other Ambulatory Visit: Payer: Self-pay | Admitting: *Deleted

## 2015-07-24 ENCOUNTER — Ambulatory Visit (HOSPITAL_BASED_OUTPATIENT_CLINIC_OR_DEPARTMENT_OTHER): Payer: Medicare HMO | Admitting: Hematology

## 2015-07-24 ENCOUNTER — Telehealth: Payer: Self-pay | Admitting: Hematology

## 2015-07-24 ENCOUNTER — Encounter: Payer: Self-pay | Admitting: *Deleted

## 2015-07-24 ENCOUNTER — Ambulatory Visit (HOSPITAL_COMMUNITY)
Admission: RE | Admit: 2015-07-24 | Discharge: 2015-07-24 | Disposition: A | Discharge status: Home / Self Care | Payer: Medicare HMO | Source: Ambulatory Visit | Attending: Hematology | Admitting: Hematology

## 2015-07-24 VITALS — BP 156/86 | HR 107 | Temp 98.0°F | Resp 16 | Ht 64.0 in | Wt 185.0 lb

## 2015-07-24 VITALS — BP 143/90 | HR 98 | Resp 18

## 2015-07-24 DIAGNOSIS — C7931 Secondary malignant neoplasm of brain: Secondary | ICD-10-CM

## 2015-07-24 DIAGNOSIS — C50211 Malignant neoplasm of upper-inner quadrant of right female breast: Secondary | ICD-10-CM

## 2015-07-24 DIAGNOSIS — Z5111 Encounter for antineoplastic chemotherapy: Secondary | ICD-10-CM | POA: Diagnosis not present

## 2015-07-24 DIAGNOSIS — Z452 Encounter for adjustment and management of vascular access device: Secondary | ICD-10-CM | POA: Diagnosis not present

## 2015-07-24 DIAGNOSIS — Z85038 Personal history of other malignant neoplasm of large intestine: Secondary | ICD-10-CM | POA: Diagnosis not present

## 2015-07-24 DIAGNOSIS — Z170 Estrogen receptor positive status [ER+]: Secondary | ICD-10-CM

## 2015-07-24 DIAGNOSIS — Z853 Personal history of malignant neoplasm of breast: Secondary | ICD-10-CM | POA: Insufficient documentation

## 2015-07-24 DIAGNOSIS — Z95828 Presence of other vascular implants and grafts: Secondary | ICD-10-CM

## 2015-07-24 DIAGNOSIS — I10 Essential (primary) hypertension: Secondary | ICD-10-CM

## 2015-07-24 DIAGNOSIS — G620 Drug-induced polyneuropathy: Secondary | ICD-10-CM | POA: Diagnosis not present

## 2015-07-24 DIAGNOSIS — C186 Malignant neoplasm of descending colon: Secondary | ICD-10-CM

## 2015-07-24 LAB — COMPREHENSIVE METABOLIC PANEL
ALBUMIN: 3.7 g/dL (ref 3.5–5.0)
ALK PHOS: 104 U/L (ref 40–150)
ALT: 32 U/L (ref 0–55)
ANION GAP: 11 meq/L (ref 3–11)
AST: 21 U/L (ref 5–34)
BUN: 15.1 mg/dL (ref 7.0–26.0)
CALCIUM: 10.2 mg/dL (ref 8.4–10.4)
CHLORIDE: 110 meq/L — AB (ref 98–109)
CO2: 22 mEq/L (ref 22–29)
CREATININE: 0.8 mg/dL (ref 0.6–1.1)
EGFR: 89 mL/min/{1.73_m2} — AB (ref >90)
Glucose: 130 mg/dl (ref 70–140)
Potassium: 3.9 mEq/L (ref 3.5–5.1)
SODIUM: 143 meq/L (ref 136–145)
Total Bilirubin: 0.52 mg/dL (ref 0.20–1.20)
Total Protein: 7.3 g/dL (ref 6.4–8.3)

## 2015-07-24 LAB — CBC WITH DIFFERENTIAL/PLATELET
BASO%: 0.1 % (ref 0.0–2.0)
BASOS ABS: 0 10*3/uL (ref 0.0–0.1)
EOS ABS: 0 10*3/uL (ref 0.0–0.5)
EOS%: 0 % (ref 0.0–7.0)
HCT: 40.3 % (ref 34.8–46.6)
HEMOGLOBIN: 13.3 g/dL (ref 11.6–15.9)
LYMPH%: 7.3 % — AB (ref 14.0–49.7)
MCH: 27.3 pg (ref 25.1–34.0)
MCHC: 33 g/dL (ref 31.5–36.0)
MCV: 82.6 fL (ref 79.5–101.0)
MONO#: 0.8 10*3/uL (ref 0.1–0.9)
MONO%: 4.1 % (ref 0.0–14.0)
NEUT#: 16.9 10*3/uL — ABNORMAL HIGH (ref 1.5–6.5)
NEUT%: 88.5 % — ABNORMAL HIGH (ref 38.4–76.8)
PLATELETS: 288 10*3/uL (ref 145–400)
RBC: 4.88 10*6/uL (ref 3.70–5.45)
RDW: 16 % — AB (ref 11.2–14.5)
WBC: 19.1 10*3/uL — ABNORMAL HIGH (ref 3.9–10.3)
lymph#: 1.4 10*3/uL (ref 0.9–3.3)

## 2015-07-24 LAB — TECHNOLOGIST REVIEW

## 2015-07-24 MED ORDER — DEXAMETHASONE SODIUM PHOSPHATE 100 MG/10ML IJ SOLN
Freq: Once | INTRAMUSCULAR | Status: AC
  Administered 2015-07-24: 12:00:00 via INTRAVENOUS
  Filled 2015-07-24: qty 8

## 2015-07-24 MED ORDER — ALTEPLASE 2 MG IJ SOLR
2.0000 mg | Freq: Once | INTRAMUSCULAR | Status: AC | PRN
  Administered 2015-07-24: 2 mg
  Filled 2015-07-24: qty 2

## 2015-07-24 MED ORDER — ALTEPLASE 2 MG IJ SOLR
2.0000 mg | Freq: Once | INTRAMUSCULAR | Status: AC
  Administered 2015-07-24: 2 mg
  Filled 2015-07-24: qty 2

## 2015-07-24 MED ORDER — HEPARIN SOD (PORK) LOCK FLUSH 100 UNIT/ML IV SOLN
INTRAVENOUS | Status: AC
  Filled 2015-07-24: qty 5

## 2015-07-24 MED ORDER — SODIUM CHLORIDE 0.9 % IV SOLN
Freq: Once | INTRAVENOUS | Status: AC
  Administered 2015-07-24: 12:00:00 via INTRAVENOUS

## 2015-07-24 MED ORDER — DOCETAXEL CHEMO INJECTION 160 MG/16ML
75.0000 mg/m2 | Freq: Once | INTRAVENOUS | Status: AC
  Administered 2015-07-24: 150 mg via INTRAVENOUS
  Filled 2015-07-24: qty 15

## 2015-07-24 MED ORDER — SODIUM CHLORIDE 0.9 % IV SOLN
600.0000 mg/m2 | Freq: Once | INTRAVENOUS | Status: AC
  Administered 2015-07-24: 1180 mg via INTRAVENOUS
  Filled 2015-07-24: qty 59

## 2015-07-24 MED ORDER — SODIUM CHLORIDE 0.9 % IJ SOLN
10.0000 mL | INTRAMUSCULAR | Status: DC | PRN
  Administered 2015-07-24: 10 mL via INTRAVENOUS
  Filled 2015-07-24: qty 10

## 2015-07-24 NOTE — Progress Notes (Signed)
Hematology and Oncology Follow Up Visit Date of Visit: 07/24/2015   Kayla [name] [id number] 1951/06/24 65 y.o. 07/24/2015 PCP: [facility] [phone])  Principle Diagnosis:  1. Colon cancer, T4N1M1, Stage IV, diagnosed on 10/27/2008, brain recurrence in 03/2011  2. Stage IIA right breast cancer, diagnosed in 02/2015  Oncology History   Breast cancer of upper-inner quadrant of right female breast [facility])   Staging form: Breast, AJCC 7th Edition     Clinical: Stage IIA (T2, N0, M0) - Unsigned     Pathologic stage from 04/14/2015: Stage IIA (T2, N0, cM0) - Signed by Truitt Merle, MD on 05/05/2015 Cancer of left colon [facility])   Staging form: Colon and Rectum, AJCC 7th Edition     Pathologic: T4a, N1a, M1 - Unsigned        Breast cancer of upper-inner quadrant of right female breast ([place])   02/27/2015 Mammogram Diagnostic mammogram Showed a 2.5 cm irregular mass within the far posterior upper inner right breast, ultrasound confirmed a 1.9 x 1.0 x 0.8 cm mass at 1:30 o'clock 15 submitted from nipple. No axillary adenopathy   03/03/2015 Initial Diagnosis Breast cancer of upper-inner quadrant of right female breast   03/03/2015 Initial Biopsy Right breast needle biopsy showed invasive ductal carcinoma, grade 1-2.   03/03/2015 Receptors her2 ER 100%+, PR 80%+, ki67 15%   04/14/2015 Surgery right breast lumpectomy and sentinel lymph node biopsy. Surgical margins were negative.   04/14/2015 Pathology Results right breast invasive ductal carcinoma, grade 2, 2.7 cm,(+) DCIS, margins were negative, 4 sentinel lymph nodes and one axillary lymph nodes were negative, (+)lymphovascular invasion.   04/14/2015 Oncotype testing RS 30, which predicts 10-year risk of distance recurrence with tamoxifen alone 20%, intermediate risk   05/21/2015 -  Adjuvant Chemotherapy  Adjuvant chemotherapy with docetaxel 75 mg/m, and Cytoxan 600 mg/m, every 3 weeks, starting 05/21/2015, planning for total 4 cycles   05/26/2015 - 05/29/2015 Hospital Admission Pt was admitted for UTI ans syncope, treated with antibiotics and IVF    Oncology History: Diagnosis of colon cancer dates back to 10/27/2008 when Kayla [name] presented with what turned out to be a bowel perforation through tumor. Stage at that time was T4 N1 M1 with pulmonary metastatic disease. The K-ras mutation was detected. Kayla [name] underwent surgical resection of her tumor with a colostomy at the time of her surgery on 10/27/2008. The colostomy has been subsequently reversed on 09/20/2010. Kayla [name] received chemotherapy consisting of FOLFOX for 10 treatments from 12/10/2008 through 06/02/2009. She achieved a partial remission from these treatments. She then received additional chemotherapy with 5-FU, leucovorin, and 5-FU by continuous infusion along with Avastin from 06/23/2009 through 11/17/2009. 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 She was doing well without evidence of disease until she developed a right hemiparesthesia in September 2012 and was found to have a 3 x 3 x 2.4 cm, lobulated, enhancing mass in the left parietal lobe with marked surrounding edema. A PET scan on 03/25/2011 showed resolution of the previously identified pulmonary nodules with no residual hypermetabolic activity. The pericardial effusion, which has been present since diagnosis, was unchanged. There were also uterine fibroids and a low-density lesion in the anterior spleen that was stable and not associated with any hypermetabolic activity. Of note, Kayla [name] had a markedly elevated CEA up to 24.7 on 03/23/2011. On 06/23/2011, the CEA was less than 0.5. Dr. Erline Levine resected the recurrent metastatic colon cancer on 03/31/2011. This was followed by stereotactic radiation on 04/15/2011.  The patient underwent a left-sided craniotomy and excision of the mass involving her left parietal lobe on 11/10/2011 by Dr. Erline Levine. The pathology report was negative for  any malignancy. Pathology report indicated benign brain with fibrosis, hemorrhage, hemosiderin deposition, abundant dystrophic calcifications, and mixed acute and chronic inflammation including foreign body multinucleated giant cells.    Prior Therapy:  Kayla [name] underwent surgical resection of her tumor with a colostomy at the time of her surgery on 10/27/2008. The colostomy has been subsequently reversed on 09/20/2010. Kayla [name] received chemotherapy consisting of FOLFOX for 10 treatments from 12/10/2008 through 06/02/2009. She achieved a partial remission from these treatments. She then received additional chemotherapy with 5-FU, leucovorin, and 5-FU by continuous infusion along with Avastin from 06/23/2009 through 11/17/2009. 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   Current therapy:  Adjuvant chemotherapy with docetaxel 75 mg/m, and Cytoxan 600 mg/m, every 3 weeks, started on 05/21/2015, planning for total 4 cycles  Interim History:  Kayla [name] returns for follow-up and last cycle of chemotherapy. She is doing very well. No significant side effects from chemotherapy except some fatigue. Her right She has mild peripheral neuropathy from previous chemotherapy, has been stable. No other new complaints. She has good appetite and energy level, functions very well at home.   Past Medical History  Diagnosis Date  . Pericardial effusion     echo 08/13/09  . LVH (left ventricular hypertrophy)     mod/severe. echo 1/11. EF 65-70%   . Hypertension   . Thrombocytopenia ([place])   . 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 Allergy     sulfa  . Colon cancer ([place])     colon/ 2010/surg/chemo  . History of radiation therapy 04/15/11    17 Gy single fraction  l parietal brain metastais  . Heart murmur   . Peripheral vascular disease ([place])   . Shortness of breath   . Blood transfusion   . GERD (gastroesophageal reflux disease)   . Headache(784.0)   .  Neuromuscular disorder ([place])     peripheral neuropathy feet  . Arthritis   . Diverticulosis 07/22/2010    sigmoid colon  . Family history of breast cancer   . Family history of colon cancer    GYN HISTORY  Menarchal: 14 LMP: 70 Contraceptive: a few years  HRT: no  G2P2:    Medications: I have reviewed the patient's current medications.  Current Outpatient Prescriptions  Medication Sig Dispense Refill  . acetaminophen (TYLENOL) 500 MG tablet Take 1,000 mg by mouth every 6 (six) hours as needed for mild pain, moderate pain or headache.     . dexamethasone (DECADRON) 4 MG tablet Take 2 tablets (8 mg total) by mouth 2 (two) times daily. Start the day before Taxotere. Then again the day after chemo for 3 days. 30 tablet 1  . fluticasone (FLONASE) 50 MCG/ACT nasal spray Place 1 spray into both nostrils daily as needed for allergies.     [name] gabapentin (NEURONTIN) 300 MG capsule TAKE ONE CAPSULE BY MOUTH AT BEDTIME (Patient taking differently: TAKE 300 MG BY MOUTH AT BEDTIME) 90 capsule 0  . lisinopril (PRINIVIL,ZESTRIL) 40 MG tablet Take 1 tablet (40 mg total) by mouth daily. 90 tablet 0  . nystatin (MYCOSTATIN) 100000 UNIT/ML suspension Take 5 mLs (500,000 Units total) by mouth 4 (four) times daily. 180 mL 1  . ondansetron (ZOFRAN) 8 MG tablet Take 1 tablet (8 mg total) by mouth  2 (two) times daily. Start the day after chemo for 3 days. Then take as needed for nausea or vomiting. 30 tablet 1  . oxyCODONE (OXY IR/ROXICODONE) 5 MG immediate release tablet Take 1-2 tablets (5-10 mg total) by mouth every 4 (four) hours as needed for moderate pain, severe pain or breakthrough pain. 40 tablet 0   No current facility-administered medications for this visit.   Allergies:  Allergies  Allergen Reactions  . Sulfonamide Derivatives Rash    Past Medical History, Surgical history, Social history, and Family History were reviewed and updated.  SMOKING HISTORY:  The patient has smoked intermittently,  about 2 packs of cigarettes per week, since she was a teenager, but stopped smoking in 2010.  Review of Systems: Constitutional:  Negative for fever, chills, night sweats, anorexia, weight loss, pain. Cardiovascular: no chest pain or dyspnea on exertion Respiratory: no cough, shortness of breath, or wheezing Neurological: no TIA or stroke symptoms Dermatological: negative for rash ENT: negative for - epistaxis, headaches, tinnitus, vertigo or visual changes Skin: Negative. Gastrointestinal: no abdominal pain, change in bowel habits, or black or bloody stools positive for - heartburn Genito-Urinary: no dysuria, trouble voiding, or hematuria Hematological and Lymphatic: negative for - bleeding problems, fatigue, jaundice or pallor Breast: negative for breast lumps Musculoskeletal: positive for - muscular weakness and pins and needles with some numbness (R>L) in lower extremities negative for - gait disturbance Remaining ROS negative.  Physical Exam: Blood pressure 156/86, pulse 107, temperature 98 F (36.7 C), temperature source Oral, resp. rate 16, height 5' 4"  (1.626 m), weight 185 lb (83.915 kg), SpO2 98 %. ECOG: 0 General appearance: alert, cooperative, appears stated age, no distress and mildly obese Head: Normocephalic, without obvious abnormality, atraumatic Neck: no adenopathy, supple, symmetrical, trachea midline and thyroid not enlarged, symmetric, no tenderness/mass/nodules HEENT: PERRLA; EOMi; No sclerae icterus. OP clear of masses.  Lymph nodes: Cervical, supraclavicular, and axillary nodes normal. Heart:regular rate and rhythm, S1, S2 normal, no murmur, click, rub or gallop Lung:chest clear, no wheezing, rales, normal symmetric air entry,  + R sided port-a-cath. Abdomin: soft, non-tender, without masses or organomegaly, normal bowel sounds and surgical scars well healed. EXT:No peripheral edema.  R>L decreased sensation and mildly decreased strength (noted in prior  exams) Neuro: R lower extremity weakness and decreased sensation.  Normal gait.  Good finger to nose bilaterally. Otherwise no focal deficits. Breasts: Breast inspection showed them to be symmetrical with no nipple discharge. The surgical incision in right breast and axilla are well-healed, no tenderness. Palpation of the breasts and axilla revealed no obvious mass that I could appreciate.    Lab Results: CBC Latest Ref Rng 07/24/2015 07/03/2015 06/12/2015  WBC 3.9 - 10.3 10e3/uL 19.1(H) 17.2(H) 18.1(H)  Hemoglobin 11.6 - 15.9 g/dL 13.3 13.0 13.6  Hematocrit 34.8 - 46.6 % 40.3 39.4 41.5  Platelets 145 - 400 10e3/uL 288 263 294    CMP Latest Ref Rng 07/03/2015 06/12/2015 06/02/2015  Glucose 70 - 140 mg/dl 112 194(H) 135  BUN 7.0 - 26.0 mg/dL 15.0 13.9 10.1  Creatinine 0.6 - 1.1 mg/dL 0.9 0.9 0.9  Sodium 136 - 145 mEq/L 142 139 142  Potassium 3.5 - 5.1 mEq/L 3.7 3.8 3.8  Chloride 101 - 111 mmol/L - - -  CO2 22 - 29 mEq/L 22 19(L) 25  Calcium 8.4 - 10.4 mg/dL 9.9 10.0 9.3  Total Protein 6.4 - 8.3 g/dL 7.1 6.9 6.7  Total Bilirubin 0.20 - 1.20 mg/dL 0.54 0.48 0.52  Alkaline Phos  40 - 150 U/L 102 108 117  AST 5 - 34 U/L 19 16 23   ALT 0 - 55 U/L 33 39 41   CEA  Status: Finalresult Visible to patient:  Not Released Nextappt: 08/11/2014 at 09:40 AM in Radiology (AP-MM 1) Dx:  Metastatic cancer to brain           Ref Range 3moago  651mogo  48m64moo  43yr72yr     CEA 0.0 - 5.0 ng/mL 0.9 1.3 1.4 1.1       PATHOLOGY REPORT Diagnosis 04/14/2015 1. Breast, lumpectomy, right - INVASIVE GRADE II DUCTAL CARCINOMA, SPANNING 2.7 CM IN GREATEST DIMENSION. - ASSOCIATED INTERMEDIATE GRADE DUCTAL CARCINOMA IN SITU. - LYMPH/VASCULAR INVASION IS IDENTIFIED. - MARGINS ARE NEGATIVE. - SEE ONCOLOGY TEMPLATE. 2. Lymph node, sentinel, biopsy, right axillary - ONE BENIGN LYMPH NODE WITH NO TUMOR SEEN (0/1). 3. Lymph node, sentinel, biopsy, right axillary - ONE BENIGN LYMPH NODE  WITH NO TUMOR SEEN (0/1). 4. Lymph node, sentinel, biopsy, right axillary - ONE BENIGN LYMPH NODE WITH NO TUMOR SEEN (0/1). 5. Lymph node, biopsy, right axillary - ONE BENIGN LYMPH NODE WITH NO TUMOR SEEN (0/1). Microscopic Comment 1. BREAST, INVASIVE TUMOR, WITH LYMPH NODES PRESENT Specimen, including laterality and lymph node sampling (sentinel, non-sentinel): Right partial breast with right sentinel lymph node sampling. Procedure: Right breast lumpectomy with right sentinel lymph node biopsies. Histologic type: Invasive ductal carcinoma. Grade: 2. Tubule formation: 3. Nuclear pleomorphism: 2. Mitotic: 1. Tumor size (gross measurement): 2.7 cm. Margins: Invasive, distance to closest margin: 0.2 cm (anterior margin). In-situ, distance to closest margin: At least 0.4 cm (all margins). Lymphovascular invasion: Yes, lymph/vascular invasion is identified. Ductal carcinoma in situ: Yes. Grade: Intermediate grade. Extensive intraductal component: No. Lobular neoplasia: Not identified. Tumor focality: Unifocal. Treatment effect: Not applicable. Extent of tumor: Tumor confined to breast parenchyma. Skin: Not received. Nipple: Not received. Skeletal muscle: Not received. Lymph nodes Examined: 3 Sentinel. 1 Non-sentinel. 4 Total. Lymph nodes with metastasis: 0. Isolated tumor cells (< 0.2 mm): 0. Micrometastasis: (> 0.2 mm and < 2.0 mm): 0. Macrometastasis: (> 2.0 mm): 0. Extracapsular extension: Not applicable. Breast prognostic profile: Performed on previous case [id number] receptor: 100%, positive. Progesterone receptor: 80%, positive. Her-2 neu: 1.30 ratio, negative. Ki-67: 15%. Non-neoplastic breast: Unremarkable. TNM: pT2, pN0. Comments: As Her-2 neu was previously negative, this will be repeated on representative tumor from the current specimen, and will be reported in an addendum to follow. (RH:ds 04/15/15)  Results: HER2 - NEGATIVE RATIO OF HER2/CEP17  SIGNALS 1.17 AVERAGE HER2 COPY NUMBER PER CELL 2.05   Radiological Studies: 1. MRI of the head with and without IV contrast on 03/23/2011 showed a solitary enhancing mass in the left parietal lobe with surrounding white matter edema, most compatible with solitary metastatic deposit. The mass lesion measured 2.7 x 2.5 cm.  2. PET scan from 03/25/2011 showed resolution of the previously- identified pulmonary nodules with no residual hypermetabolic activity noted in the neck, chest, abdomen or pelvis to suggest active malignancy. There was a pericardial effusion and some subsegmental atelectasis in the lower lobes. There were also uterine fibroids and a low-density lesion in the anterior spleen that was stable and not associated with hypermetabolic activity.  3. MRI of the head with and without IV contrast showed Stealth protocol utilized to evaluate left parietal mass. The mass measured 3 x 3 x 2.4 cm with marked surrounding vasogenic edema.  4. MRI of the head with and without  IV contrast on 04/08/2011 showed postsurgical changes of tumor removal in the left parietal lobe and a postsurgical hematoma with enhancement. There was 3 mm of midline shift. No other metastatic deposits.  5. MRI of the head with and without IV contrast on 06/28/2011 showed a 3 cm area of restricted diffusion lying within the previous area of  left parietal surgical resection for colon cancer. There was enhancement of the peripheral aspect of the cavity, marked restricted diffusion and increasing vasogenic edema. There was concern about the development of a brain abscess.  6. MRI of the head with and without IV contrast on 07/29/2011 showed left parietal postoperative hematoma was smaller compared with the  prior study. There was no nodular enhancement seen to indicate tumor. No new enhancing lesions were seen. There was significant  improvement in the left parietal edema.  7. Digital screening mammogram was negative on  08/02/2011.  8. An MRI of the head with and without IV contrast on 10/21/2011 showed development of gyriform enhancement circumferentially along the margins of the left parietal postoperative space/hematoma. Marked increase in vasogenic edema was seen. 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 There was further contraction of the hematoma/postoperative space in the left parietal cortical region, now measuring 17 x 19 mm in transverse diameter as opposed to 22 x 21 mm previously.  Left-to-right shift was 2 mm.  9. Nuclear medicine brain PET-CT scan carried out on 10/27/2011 showed hypermetabolic activity in the high left parietal lobe which corresponds to gyriform enhancement on comparison MRI from 10/21/2011. This was felt to be most consistent with recurrent  tumor.  10. Chest x-ray, 2 view from 11/04/2011, showed chronic cardiomegaly. Port-A-Cath was in place. No acute disease was present.  11. MRI of the head with and without IV contrast on 01/27/2012 showed findings that were felt to be consistent with progression of disease. This exam was compared with the MRI of 10/21/2011. It was recognized that the patient underwent surgical resection of the mass on 11/10/2011.  There was felt to be on the present exam residual peripheral enhancement of approximately 35 x 40 x 21 mm with infiltration of the cortex and deep white matter. It was felt that the enhancement had progressed, despite removal of the central focus. There was moderate vasogenic  edema throughout the left hemisphere but slightly decreased from the MRI from 10/21/2011. No new lesions were seen. There was mild atrophy with chronic microvascular ischemic change, but no midline shift. Major intracranial vascular structures were patent.  12. MRI of the brain with and without IV contrast on 05/04/2012 showed previous left parietal lobe surgery for resection of tumor and radiation necrosis.  No  new abnormalities were noted. 13. Chest x-ray, 2 view, on 07/03/2012 showed enlargement of the cardiac silhouette with some lingular scarring, as compared with the chest x-ray from 11/04/2011. 14. 2-D echocardiogram from 07/12/2012 showed left ventricular ejection fraction of 55-60%.  There was a small to moderate circumferential pericardial effusion with no signs of tamponade.  There was felt to be no significant change from the 2-D echocardiogram dated 08/13/2009. 15. Digital bilateral screening mammogram on 08/06/2012 was negative. 16. 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  There was no progression or new brain metastasis identified. 17. PET scan from 10/16/2012 showed no specific features to suggest metastatic  disease.  There was diffuse uptake throughout the thyroid gland, possibly due to hyperthyroidism. 18. MRI of the brain with and without IV contrast on 03/12/2013 showed residual gyriform enhancement surrounding the area of previous surgical resection of a left parietal metastasis with subsequent resection of radionecrosis. The findings likely represent some residual brain injury, but there is no significant progression of radionecrosis and no new lesions seen. 19. MRI of the brain with and without IV contrast on 11/01/2013 showed Stable posttherapy appearance of the brain since May of 2014. No progression or new brain metastasis identified. 20. CT chest,abomen and pelvis with contrast. 1. No evidence of thoracic metastasis.  2. Small prevascular lymph node is similar prior. 3. Stable pericardial effusion. 4. Interval increase in the volume presacral lymph node in the upper pelvis. This has progressed slowly from 2011. Recommend attention on follow-up versus restaging FDG PET scan. 5. Stable anastomosis in the descending colon. 21. CT chest,  abdomen and pelvis with contrast 07/126/2015. 1. No evidence of local colon cancer recurrence or metastasis within the abdomen or pelvis. 2. Retention of stool at the colocolonic anastomosis in the left colon is unchanged comparison exam. No obstructing lesion identified.  22. Mesenteric lymph node is again demonstrated with no increased size. 4. Stable splenic lesion likely representing a complex cysts or hemangioma. 5. Stable small pericardial effusion 23. Brain MRI 11/11/2014: stable and satisfactory posttherapy appearance of the brain since 2014 and 2015. No new brain metastasis identified. 24. CT chest, abdomen and pelvis w contrast 02/02/2015: 1. No new or progressive findings to suggest recurrent colorectal, cancer in the chest, abdomen, or pelvis. 2. 16 mm soft tissue nodule in the medial right breast. Correlation with mammographic history recommended. 3. Fibroid change in the uterus. 4. Stable hypodense lesion in the anterior spleen, compatible with complex cyst or hemangioma. 5. Stable appearance of the trace pericardial effusion.   Impression and Plan: 65 year old African-American female, postmenopausal  1. Right breat invasive ductal carcinoma, pT2N0M0, stage IIA, ER+/PR+, HER2-, Oncotype RS 30 -I reviewed her surgical pathology findings with her in great details. -I reviewed her Oncotype DX test results. The recurrence score is 30, which predicts 10 year risk of distant recurrence 20% with tamoxifen alone. This is considered intermediate risk, but close to high risk group. -. Her recurrence score is very close to high risk group, and she is in good health overall, I recommend her to have adjuvant chemotherapy, with docetaxel and Cytoxan, intravenously every 3 weeks, for total of 4 cycles.  -The goal of treatment is curative. -Given this strong ER/PR positivity, I recommend adjuvant endocrine therapy with aromatase inhibitor, after she completes breast radiation. -She would benefit from  adjuvant breast radiation also. This will be followed up by her radiation oncologist Dr. Tammi Klippel -She is tolerating chemotherapy well. Lab reviewed, adequate for treatment, we'll proceed cycle 4 (last cycle) chemotherapy today. -will start adjuvant AI when she completes breast radiation.   2.  HTN -she has restarted Lisinopril  -continue monitoring BP at home   3. Stage IV Colon Cancer mets to lung and brain, NED --Kayla [name] continues to do well with no evidence for disease recurrence.  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   -She is clinically doing very well. Her CEA level has been normal.  - her last brain MRI in August 2016  Was negative for recurrenc -her recent restaging  CT scans in 01/2015 showed no evidence of recurrence - she is clinically doing well, physical exam is unremarkable, we'll continue observation. -follow up with lab CBC, CMP and CEA every 3 months.  5. Peripheral neuropathy, grade 1, secondary to prior chemotherapy  -stable  6 Genetics -Given her positive family and personal history of breast (Mother at age of 59 and cousin) and colon cancer (cousin), she was referred to genetic counselor -Her genetic test was negative  She will continue follow-up with her primary care physician for other medical problems  Plan -cycle 4 TC today (last cycle)  -she will see Dr. Tammi Klippel soon to start breast radiation -I will see her back in 2 months with lab and port flush (pt declines more frequent port flush)   I spent 15 minutes counseling the patient face to face. The total time spent in the appointment was 20 minutes. She knows to call us if she has any concern or issues after the last cycle chemo.    Truitt Merle  07/24/2015

## 2015-07-24 NOTE — Progress Notes (Signed)
Pt will be going down to radiology for dye test to check PAC placement. Attempted to reinsert needle and was unsuccessful. Pt refused avs at this time.

## 2015-07-24 NOTE — Patient Instructions (Signed)

## 2015-07-24 NOTE — Telephone Encounter (Signed)
Gave patient avs report and appointments for March  °

## 2015-07-24 NOTE — Progress Notes (Signed)
Pt port had no blood return after 30 min.TPA still instilled in port at this time. Pt does not want to wait another 1-2 hrs. Opted to have treatment through peripheral line today. [place] with Dr. Burr Medico. Will recheck port in 1 hr for blood return.  1045- TPA 1115- no blood return  1145- no blood return 1215- no blood return 1245- no blood return. [place]-  No blood return 1405- no blood return 1435- no blood return.  Notified Dr. Burr Medico and would check on radiology for dye test for today. Awaiting schedule.

## 2015-07-24 NOTE — Progress Notes (Signed)
No blood return noted after 2 hours of instilled TPA. Amount aspirated from [facility]. Order received to repeat x1 per Dr. Burr Medico.  If no blood return at time of treatment completion, send to radiology for dye study.

## 2015-07-24 NOTE — Patient Instructions (Signed)
[place] Discharge Instructions for Patients Receiving Chemotherapy  Today you received the following chemotherapy agents :  Taxotere and Cytoxan.  To help prevent nausea and vomiting after your treatment, we encourage you to take your nausea medication: zofran 8 mg every 12 hours as needed.   If you develop nausea and vomiting that is not controlled by your nausea medication, call the clinic.   BELOW ARE SYMPTOMS THAT SHOULD BE REPORTED IMMEDIATELY:  *FEVER GREATER THAN 100.5 F  *CHILLS WITH OR WITHOUT FEVER  NAUSEA AND VOMITING THAT IS NOT CONTROLLED WITH YOUR NAUSEA MEDICATION  *UNUSUAL SHORTNESS OF BREATH  *UNUSUAL BRUISING OR BLEEDING  TENDERNESS IN MOUTH AND THROAT WITH OR WITHOUT PRESENCE OF ULCERS  *URINARY PROBLEMS  *BOWEL PROBLEMS  UNUSUAL RASH Items with * indicate a potential emergency and should be followed up as soon as possible.  Feel free to call the clinic you have any questions or concerns. The clinic phone number is [phone].  Please show the [place] at check-in to the Emergency Department and triage nurse.

## 2015-07-27 ENCOUNTER — Other Ambulatory Visit: Payer: Self-pay | Admitting: *Deleted

## 2015-07-27 DIAGNOSIS — C50211 Malignant neoplasm of upper-inner quadrant of right female breast: Secondary | ICD-10-CM

## 2015-07-27 NOTE — Progress Notes (Signed)
Spoke with pt today and informed pt of port situation.  Pt was aware that port is not usable.  Informed pt that per Dr. Burr Medico - since pt completed chemo treatment, md recommended port removal if pt agrees.   Pt agreed with port removal.   Informed pt that radiology scheduler will contact pt with date and time for procedure.  Pt voiced understanding.  Order placed in EPIC and spoke with [facility] in [place].  Tye [place] stated pt will be contacted.

## 2015-07-31 ENCOUNTER — Ambulatory Visit
Admission: RE | Admit: 2015-07-31 | Discharge: 2015-07-31 | Disposition: A | Discharge status: Home / Self Care | Payer: Medicare HMO | Source: Ambulatory Visit | Attending: Radiation Oncology | Admitting: Radiation Oncology

## 2015-07-31 ENCOUNTER — Other Ambulatory Visit: Payer: Self-pay | Admitting: Physician Assistant

## 2015-07-31 ENCOUNTER — Other Ambulatory Visit: Payer: Self-pay | Admitting: Radiology

## 2015-07-31 DIAGNOSIS — C7931 Secondary malignant neoplasm of brain: Secondary | ICD-10-CM

## 2015-07-31 DIAGNOSIS — C7949 Secondary malignant neoplasm of other parts of nervous system: Principal | ICD-10-CM

## 2015-07-31 DIAGNOSIS — C189 Malignant neoplasm of colon, unspecified: Secondary | ICD-10-CM | POA: Diagnosis not present

## 2015-07-31 MED ORDER — GADOBENATE DIMEGLUMINE 529 MG/ML IV SOLN
17.0000 mL | Freq: Once | INTRAVENOUS | Status: AC | PRN
  Administered 2015-07-31: 17 mL via INTRAVENOUS

## 2015-08-03 ENCOUNTER — Encounter: Payer: Self-pay | Admitting: Radiation Oncology

## 2015-08-03 ENCOUNTER — Ambulatory Visit
Admission: RE | Admit: 2015-08-03 | Discharge: 2015-08-03 | Disposition: A | Discharge status: Home / Self Care | Payer: Medicare HMO | Source: Ambulatory Visit | Attending: Radiation Oncology | Admitting: Radiation Oncology

## 2015-08-03 ENCOUNTER — Encounter (HOSPITAL_COMMUNITY): Payer: Self-pay

## 2015-08-03 ENCOUNTER — Ambulatory Visit (HOSPITAL_COMMUNITY)
Admission: RE | Admit: 2015-08-03 | Discharge: 2015-08-03 | Disposition: A | Discharge status: Home / Self Care | Payer: Medicare HMO | Source: Ambulatory Visit | Attending: Hematology | Admitting: Hematology

## 2015-08-03 VITALS — BP 120/74 | HR 101 | Resp 16 | Ht 64.0 in | Wt 183.1 lb

## 2015-08-03 DIAGNOSIS — I10 Essential (primary) hypertension: Secondary | ICD-10-CM | POA: Diagnosis not present

## 2015-08-03 DIAGNOSIS — Z853 Personal history of malignant neoplasm of breast: Secondary | ICD-10-CM | POA: Diagnosis not present

## 2015-08-03 DIAGNOSIS — Z803 Family history of malignant neoplasm of breast: Secondary | ICD-10-CM | POA: Diagnosis not present

## 2015-08-03 DIAGNOSIS — Z452 Encounter for adjustment and management of vascular access device: Secondary | ICD-10-CM | POA: Insufficient documentation

## 2015-08-03 DIAGNOSIS — Z87891 Personal history of nicotine dependence: Secondary | ICD-10-CM | POA: Insufficient documentation

## 2015-08-03 DIAGNOSIS — I501 Left ventricular failure: Secondary | ICD-10-CM | POA: Diagnosis not present

## 2015-08-03 DIAGNOSIS — Z170 Estrogen receptor positive status [ER+]: Secondary | ICD-10-CM | POA: Insufficient documentation

## 2015-08-03 DIAGNOSIS — Z9221 Personal history of antineoplastic chemotherapy: Secondary | ICD-10-CM | POA: Insufficient documentation

## 2015-08-03 DIAGNOSIS — Z800 Family history of malignant neoplasm of digestive organs: Secondary | ICD-10-CM | POA: Insufficient documentation

## 2015-08-03 DIAGNOSIS — C50211 Malignant neoplasm of upper-inner quadrant of right female breast: Secondary | ICD-10-CM

## 2015-08-03 DIAGNOSIS — Z8249 Family history of ischemic heart disease and other diseases of the circulatory system: Secondary | ICD-10-CM | POA: Diagnosis not present

## 2015-08-03 DIAGNOSIS — C7931 Secondary malignant neoplasm of brain: Secondary | ICD-10-CM | POA: Diagnosis not present

## 2015-08-03 DIAGNOSIS — D696 Thrombocytopenia, unspecified: Secondary | ICD-10-CM | POA: Insufficient documentation

## 2015-08-03 DIAGNOSIS — K219 Gastro-esophageal reflux disease without esophagitis: Secondary | ICD-10-CM | POA: Insufficient documentation

## 2015-08-03 DIAGNOSIS — Z85841 Personal history of malignant neoplasm of brain: Secondary | ICD-10-CM | POA: Diagnosis not present

## 2015-08-03 DIAGNOSIS — Z923 Personal history of irradiation: Secondary | ICD-10-CM | POA: Insufficient documentation

## 2015-08-03 DIAGNOSIS — I739 Peripheral vascular disease, unspecified: Secondary | ICD-10-CM | POA: Insufficient documentation

## 2015-08-03 DIAGNOSIS — Z510 Encounter for antineoplastic radiation therapy: Secondary | ICD-10-CM | POA: Insufficient documentation

## 2015-08-03 DIAGNOSIS — M1990 Unspecified osteoarthritis, unspecified site: Secondary | ICD-10-CM | POA: Diagnosis not present

## 2015-08-03 DIAGNOSIS — Z85038 Personal history of other malignant neoplasm of large intestine: Secondary | ICD-10-CM | POA: Insufficient documentation

## 2015-08-03 DIAGNOSIS — R011 Cardiac murmur, unspecified: Secondary | ICD-10-CM | POA: Insufficient documentation

## 2015-08-03 DIAGNOSIS — G709 Myoneural disorder, unspecified: Secondary | ICD-10-CM | POA: Diagnosis not present

## 2015-08-03 DIAGNOSIS — C7949 Secondary malignant neoplasm of other parts of nervous system: Secondary | ICD-10-CM | POA: Insufficient documentation

## 2015-08-03 DIAGNOSIS — Z7951 Long term (current) use of inhaled steroids: Secondary | ICD-10-CM | POA: Insufficient documentation

## 2015-08-03 DIAGNOSIS — G629 Polyneuropathy, unspecified: Secondary | ICD-10-CM | POA: Diagnosis not present

## 2015-08-03 LAB — PROTIME-INR
INR: 1 (ref 0.00–1.49)
PROTHROMBIN TIME: 13.4 s (ref 11.6–15.2)

## 2015-08-03 LAB — CBC
HCT: 38.9 % (ref 36.0–46.0)
Hemoglobin: 12.5 g/dL (ref 12.0–15.0)
MCH: 27.3 pg (ref 26.0–34.0)
MCHC: 32.1 g/dL (ref 30.0–36.0)
MCV: 84.9 fL (ref 78.0–100.0)
PLATELETS: 213 10*3/uL (ref 150–400)
RBC: 4.58 MIL/uL (ref 3.87–5.11)
RDW: 16 % — AB (ref 11.5–15.5)
WBC: 1.6 10*3/uL — ABNORMAL LOW (ref 4.0–10.5)

## 2015-08-03 LAB — APTT: aPTT: 30 seconds (ref 24–37)

## 2015-08-03 MED ORDER — MIDAZOLAM HCL 2 MG/2ML IJ SOLN
INTRAMUSCULAR | Status: AC | PRN
  Administered 2015-08-03 (×3): 1 mg via INTRAVENOUS

## 2015-08-03 MED ORDER — FENTANYL CITRATE (PF) 100 MCG/2ML IJ SOLN
INTRAMUSCULAR | Status: AC
  Filled 2015-08-03: qty 4

## 2015-08-03 MED ORDER — CEFAZOLIN SODIUM-DEXTROSE 2-3 GM-% IV SOLR
INTRAVENOUS | Status: AC
  Administered 2015-08-03: 2 g via INTRAVENOUS
  Filled 2015-08-03: qty 50

## 2015-08-03 MED ORDER — LIDOCAINE-EPINEPHRINE 2 %-1:100000 IJ SOLN
INTRAMUSCULAR | Status: AC
  Filled 2015-08-03: qty 1

## 2015-08-03 MED ORDER — CEFAZOLIN SODIUM-DEXTROSE 2-3 GM-% IV SOLR
2.0000 g | Freq: Once | INTRAVENOUS | Status: AC
  Administered 2015-08-03: 2 g via INTRAVENOUS

## 2015-08-03 MED ORDER — SODIUM CHLORIDE 0.9 % IV SOLN
INTRAVENOUS | Status: DC
  Administered 2015-08-03: 13:00:00 via INTRAVENOUS

## 2015-08-03 MED ORDER — FENTANYL CITRATE (PF) 100 MCG/2ML IJ SOLN
INTRAMUSCULAR | Status: AC | PRN
  Administered 2015-08-03: 50 ug via INTRAVENOUS
  Administered 2015-08-03: 25 ug via INTRAVENOUS

## 2015-08-03 MED ORDER — LIDOCAINE HCL 1 % IJ SOLN
INTRAMUSCULAR | Status: AC
  Filled 2015-08-03: qty 20

## 2015-08-03 MED ORDER — MIDAZOLAM HCL 2 MG/2ML IJ SOLN
INTRAMUSCULAR | Status: AC
  Filled 2015-08-03: qty 6

## 2015-08-03 NOTE — Discharge Instructions (Signed)
Incision Care An incision is when a surgeon cuts into your body. After surgery, the incision needs to be cared for properly to prevent infection.  HOW TO CARE FOR YOUR INCISION  Take medicines only as directed by your health care provider.  There are many different ways to close and cover an incision, including stitches, skin glue, and adhesive strips. Follow your health care provider's instructions on:  Incision care.  Bandage (dressing) changes and removal.  Incision closure removal.  Do not take baths, swim, or use a hot tub until your health care provider approves. You may shower as directed by your health care provider.  Resume your normal diet and activities as directed.  Use anti-itch medicine (such as an antihistamine) as directed by your health care provider. The incision may itch while it is healing. Do not pick or scratch at the incision.  Drink enough fluid to keep your urine clear or pale yellow. SEEK MEDICAL CARE IF:   You have drainage, redness, swelling, or pain at your incision site.  You have muscle aches, chills, or a general ill feeling.  You notice a bad smell coming from the incision or dressing.  Your incision edges separate after the sutures, staples, or skin adhesive strips have been removed.  You have persistent nausea or vomiting.  You have a fever.  You are dizzy. SEEK IMMEDIATE MEDICAL CARE IF:   You have a rash.  You faint.  You have difficulty breathing. MAKE SURE YOU:   Understand these instructions.  Will watch your condition.  Will get help right away if you are not doing well or get worse.   This information is not intended to replace advice given to you by your health care provider. Make sure you discuss any questions you have with your health care provider.   Document Released: 01/21/2005 Document Revised: 07/25/2014 Document Reviewed: 08/28/2013 Elsevier Interactive Patient Education 2016 Elsevier Inc. Moderate Conscious  Sedation, Adult Sedation is the use of medicines to promote relaxation and relieve discomfort and anxiety. Moderate conscious sedation is a type of sedation. Under moderate conscious sedation you are less alert than normal but are still able to respond to instructions or stimulation. Moderate conscious sedation is used during short medical and dental procedures. It is milder than deep sedation or general anesthesia and allows you to return to your regular activities sooner. LET [facility] CARE PROVIDER KNOW ABOUT:   Any allergies you have.  All medicines you are taking, including vitamins, herbs, eye drops, creams, and over-the-counter medicines.  Use of steroids (by mouth or creams).  Previous problems you or members of your family have had with the use of anesthetics.  Any blood disorders you have.  Previous surgeries you have had.  Medical conditions you have.  Possibility of pregnancy, if this applies.  Use of cigarettes, alcohol, or illegal drugs. RISKS AND COMPLICATIONS Generally, this is a safe procedure. However, as with any procedure, problems can occur. Possible problems include:  Oversedation.  Trouble breathing on your own. You may need to have a breathing tube until you are awake and breathing on your own.  Allergic reaction to any of the medicines used for the procedure. BEFORE THE PROCEDURE  You may have blood tests done. These tests can help show how well your kidneys and liver are working. They can also show how well your blood clots.  A physical exam will be done.  Only take medicines as directed by your health care provider. You may need  to stop taking medicines (such as blood thinners, aspirin, or nonsteroidal anti-inflammatory drugs) before the procedure.   Do not eat or drink at least 6 hours before the procedure or as directed by your health care provider.  Arrange for a responsible adult, family member, or friend to take you home after the procedure.  He or she should stay with you for at least 24 hours after the procedure, until the medicine has worn off. PROCEDURE   An intravenous (IV) catheter will be inserted into one of your veins. Medicine will be able to flow directly into your body through this catheter. You may be given medicine through this tube to help prevent pain and help you relax.  The medical or dental procedure will be done. AFTER THE PROCEDURE  You will stay in a recovery area until the medicine has worn off. Your blood pressure and pulse will be checked.   Depending on the procedure you had, you may be allowed to go home when you can tolerate liquids and your pain is under control.   This information is not intended to replace advice given to you by your health care provider. Make sure you discuss any questions you have with your health care provider.   Document Released: 03/29/2001 Document Revised: 07/25/2014 Document Reviewed: 03/11/2013 Elsevier Interactive Patient Education [employer].

## 2015-08-03 NOTE — Progress Notes (Signed)
See progress note under physician encounter. 

## 2015-08-03 NOTE — H&P (Signed)
Chief Complaint: Patient was seen in consultation today for Port-A-Cath removal  Referring Physician(s): Feng,Yan  History of Present Illness: Kayla [name] is a 65 y.o. female with prior history of colon cancer in 2010 and most recently right breast cancer in 2016. She has completed chemotherapy and presents today for Port-A-Cath removal.  Past Medical History  Diagnosis Date  . Pericardial effusion     echo 08/13/09  . LVH (left ventricular hypertrophy)     mod/severe. echo 1/11. EF 65-70%   . Hypertension   . Thrombocytopenia ([place])   . 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 Allergy     sulfa  . Colon cancer ([place])     colon/ 2010/surg/chemo  . History of radiation therapy 04/15/11    17 Gy single fraction  l parietal brain metastais  . Heart murmur   . Peripheral vascular disease ([place])   . Shortness of breath   . Blood transfusion   . GERD (gastroesophageal reflux disease)   . Headache(784.0)   . Neuromuscular disorder ([place])     peripheral neuropathy feet  . Arthritis   . Diverticulosis 07/22/2010    sigmoid colon  . Family history of breast cancer   . Family history of colon cancer     Past Surgical History  Procedure Laterality Date  . Rotator cuff repair      rt  . Tonsillectomy    . Tubal ligation    . Craniotomy  03/31/11    left parietal mass resection  . Colostomy closure    . Tonsillectomy    . Colon surgery    . Portacath placement      10  . Craniotomy  11/10/2011    Procedure: CRANIOTOMY TUMOR EXCISION;  Surgeon: Erline Levine, MD;  Location: [facility];  Service: Neurosurgery;  Laterality: N/A;  Craniotomy for Biopsy of Tumor  . Colonoscopy  07/22/2010  . Breast lumpectomy with radioactive seed and sentinel lymph node biopsy Right 04/14/2015    Procedure: RIGHT BREAST LUMPECTOMY WITH RADIOACTIVE SEED ANDAXILLARY SENTINEL LYMPH NODE BIOPSY;  Surgeon: Jackolyn Confer, MD;  Location: [facility];  Service:  General;  Laterality: Right;    Allergies: Sulfonamide derivatives  Medications: Prior to Admission medications   Medication Sig Start Date End Date Taking? Authorizing Provider  acetaminophen (TYLENOL) 500 MG tablet Take 1,000 mg by mouth every 6 (six) hours as needed for mild pain, moderate pain or headache.    Yes Historical Provider, MD  fluticasone (FLONASE) 50 MCG/ACT nasal spray Place 1 spray into both nostrils daily as needed for allergies.  10/09/14  Yes Historical Provider, MD  gabapentin (NEURONTIN) 300 MG capsule TAKE ONE CAPSULE BY MOUTH AT BEDTIME Patient taking differently: TAKE 300 MG BY MOUTH AT BEDTIME 01/25/14  Yes Concha Norway, MD  lisinopril (PRINIVIL,ZESTRIL) 40 MG tablet Take 1 tablet (40 mg total) by mouth daily. 02/17/14  Yes Concha Norway, MD  oxyCODONE (OXY IR/ROXICODONE) 5 MG immediate release tablet Take 1-2 tablets (5-10 mg total) by mouth every 4 (four) hours as needed for moderate pain, severe pain or breakthrough pain. 04/14/15  Yes Jackolyn Confer, MD     Family History  Problem Relation Age of Onset  . Breast cancer Mother 39  . Diabetes Father   . Coronary artery disease Father   . Gout Brother   . Breast cancer Cousin     dx in her 61s  . Colon  cancer Cousin     mother's maternal first cousin  . Diabetes Maternal Aunt   . Cirrhosis Brother     Social History   Social History  . Marital Status: Divorced    Spouse Name: N/A  . Number of Children: N/A  . Years of Education: N/A   Social History Main Topics  . Smoking status: Former Smoker -- 0.25 packs/day for 37 years    Types: Cigarettes    Quit date: 11/03/2008  . Smokeless tobacco: Never Used     Comment: 30 pack year hx   . Alcohol Use: No     Comment: rarely  . Drug Use: No  . Sexual Activity: Yes    Birth Control/ Protection: Post-menopausal   Other Topics Concern  . None   Social History Narrative   Full time- loader on truck    Single, 2 sons- 2 and 64.       Review of  Systems  Constitutional: Negative for fever and chills.  Respiratory: Negative for cough and shortness of breath.   Cardiovascular: Negative for chest pain.  Gastrointestinal: Negative for nausea, vomiting and abdominal pain.  Genitourinary: Negative for dysuria and hematuria.  Musculoskeletal: Positive for back pain.  Neurological: Negative for headaches.    Vital Signs: BP 134/80 mmHg  Pulse 99  Temp(Src) 98.9 F (37.2 C) (Oral)  Resp 18  Ht '5\' 4"'$  (1.626 m)  Wt 181 lb 3.2 oz (82.192 kg)  BMI 31.09 kg/m2  SpO2 98%  Physical Exam  Constitutional: She is oriented to person, place, and time. She appears well-developed and well-nourished.  Cardiovascular: Normal rate and regular rhythm.   Clean intact right chest wall Port-A-Cath  Pulmonary/Chest: Effort normal and breath sounds normal.  Abdominal: Soft. Bowel sounds are normal.  Musculoskeletal: Normal range of motion. She exhibits no edema.  Neurological: She is alert and oriented to person, place, and time.    Mallampati Score:     Imaging: Mr [name] Contrast  07/31/2015  CLINICAL DATA:  Stage IV colon cancer. Left parietal brain metastasis status post resection and SRS in 2012. Restaging. EXAM: MRI HEAD WITHOUT AND WITH CONTRAST TECHNIQUE: Multiplanar, multiecho pulse sequences of the brain and surrounding structures were obtained without and with intravenous contrast. CONTRAST:  34m MULTIHANCE GADOBENATE DIMEGLUMINE 529 MG/ML IV SOLN COMPARISON:  03/13/2015 FINDINGS: There is no evidence of acute infarct, mass, midline shift, or extra-axial fluid collection. T2 hyperintensity scattered throughout the white matter of both cerebral hemispheres are unchanged and nonspecific but may reflect a combination of chronic small vessel ischemia and post treatment changes. Sequelae of prior left parietal craniotomy are again identified with stable underlying encephalomalacia and chronic hemosiderin deposition. Dural thickening and  enhancement at the resection site is unchanged and likely postoperative in nature. No abnormal enhancement is seen remote from the craniotomy. No suspicious skull lesions are identified. The orbits are unremarkable. A trace left mastoid effusion is noted. The paranasal sinuses are clear. Major intracranial vascular flow voids are preserved. IMPRESSION: Stable post treatment appearance of the brain. No evidence of recurrent intracranial metastatic disease. Electronically Signed   By: ALogan BoresM.D.   On: 07/31/2015 13:53   Ir Chest Fluoro  07/24/2015  CLINICAL DATA:  Indwelling surgically placed right-sided Port-A-Cath with difficulty in accessing the port. History of breast carcinoma. EXAM: CHEST FLUOROSCOPY TECHNIQUE: Real-time fluoroscopic evaluation of the chest was performed. Skin overlying the port reservoir was prepped with chlorhexidine. Multiple attempts were made to access the  port. RADIOPHARMACEUTICALS:  None FLUOROSCOPY TIME:  48 seconds COMPARISON:  None. FINDINGS: Fluoroscopy demonstrates a right-sided Port-A-Cath with the catheter tip entering the jugular vein and terminating near the expected confluence of bilateral brachiocephalic veins. Port tubing appears grossly intact by fluoroscopy. Despite multiple attempts by different operators, the port could not be successfully accessed. It may be either severely angulated in the pocket or completely flipped. Contrast injection was unable to be performed. IMPRESSION: Inability to successfully access the right chest Port-A-Cath for contrast injection. By tactile feel, the port is either severely angulated in the pocket or completely flipped over. The catheter tip lies in the expected position of the confluence of bilateral brachiocephalic veins. Electronically Signed   By: Aletta Edouard M.D.   On: 07/24/2015 17:32    Labs:  CBC:  Recent Labs  06/02/15 0953 06/12/15 1018 07/03/15 1048 07/24/15 1006  WBC 6.0 18.1* 17.2* 19.1*  HGB 13.2  13.6 13.0 13.3  HCT 40.3 41.5 39.4 40.3  PLT 272 294 263 288    COAGS:  Recent Labs  05/27/15 0453  INR 1.07    BMP:  Recent Labs  04/09/15 1400  05/26/15 1220 05/27/15 0453 05/29/15 0544 06/02/15 0953 06/12/15 1019 07/03/15 1048 07/24/15 1006  NA 142  < > 137 139 138 142 139 142 143  K 4.2  < > 3.0* 4.2 4.1 3.8 3.8 3.7 3.9  CL 105  --  100* 109 108  --   --   --   --   CO2 29  < > '30 26 25 25 '$ 19* 22 22  GLUCOSE 96  < > 117* 97 95 135 194* 112 130  BUN 12  < > 27* 17 11 10.1 13.9 15.0 15.1  CALCIUM 9.6  < > 8.6* 8.1* 8.8* 9.3 10.0 9.9 10.2  CREATININE 0.87  < > 1.14* 0.80 0.81 0.9 0.9 0.9 0.8  GFRNONAA >60  --  50* >60 >60  --   --   --   --   GFRAA >60  --  58* >60 >60  --   --   --   --   < > = values in this interval not displayed.  LIVER FUNCTION TESTS:  Recent Labs  06/02/15 0953 06/12/15 1019 07/03/15 1048 07/24/15 1006  BILITOT 0.52 0.48 0.54 0.52  AST '23 16 19 21  '$ ALT 41 39 33 32  ALKPHOS 117 108 102 104  PROT 6.7 6.9 7.1 7.3  ALBUMIN 3.4* 3.6 3.6 3.7    TUMOR MARKERS:  Recent Labs  08/04/14 1248 05/21/15 0838  CEA 1.1 0.9    Assessment and Plan: 65 y.o. female with prior history of colon cancer in 2010 and most recently right breast cancer in 2016. She has completed chemotherapy and presents today for Port-A-Cath removal. Details/risks of procedure, including but not limited to, internal bleeding, infection discussed with patient and sister with their understanding and consent.   Thank you for this interesting consult.  I greatly enjoyed meeting SILVER ACHEY and look forward to participating in their care.  A copy of this report was sent to the requesting provider on this date.  Electronically Signed: D. Rowe Robert 08/03/2015, 1:24 PM   I spent a total of 15 minutes in face to face in clinical consultation, greater than 50% of which was counseling/coordinating care for Port-A-Cath removal

## 2015-08-03 NOTE — Progress Notes (Signed)
Radiation Oncology         [phone]) [phone] ________________________________  Name: Kayla [name] MRN: [id number]  Date: 08/03/2015  DOB: 1951-06-03  CC:No PCP Per Patient  [name], DO     REFERRING PHYSICIAN: [name], DO   DIAGNOSIS: The encounter diagnosis was Breast cancer of upper-inner quadrant of right female breast ([place]).   HISTORY OF PRESENT ILLNESS:Kayla [name] is a 65 y.o. female who is seen for an initial consultation visit regarding the patient's new diagnosis of right breast cancer. 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 Rolena Infante She then went on to receive Avastin, 5-FU, leucovorin which she completed in May 2011. Incidentally 2012 she was found to have a lobulated enhancing mass in the left parietal lobe with surrounding edema and underwent RS radiation under the care of Dr. Tammi Klippel. She has since been without evidence of recurrence in comes today for follow-up of an MRI was performed on 07/31/2015 which did not reveal any new evidence of intracranial disease and revealed posttreatment appearance to the brain.   Apparently the patient had a CT abdomen pelvis in the summer 2016 which revealed a right breast mass. Diagnostic mammography on 02/27/2015 revealed a 2.5 cm irregular mass within the far posterior upper inner right breast. A biopsy on 03/03/2015 revealed invasive mammary carcinoma grade 1-2 ER 100%, PR 80% Ki-67 15%. She then underwent lumpectomy with sentinel node mapping. 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 She has  undergone Oncotype testing with a recurrent score result of 30. She has been under the care of Dr. Burr Medico and just completed 4 cycles of Cytoxan, Adriamycin. She is ready to proceed with adjuvant radiation therapy to the breast and comes to meet with Dr. Lisbeth Renshaw for consideration of this.  PREVIOUS RADIATION THERAPY: Yes see above   PAST MEDICAL HISTORY:  has a past medical history of Pericardial effusion; LVH (left ventricular hypertrophy); Hypertension; Thrombocytopenia ([place]); Brain cancer ([place]); Allergy; Colon cancer ([place]); History of radiation therapy (04/15/11); Heart murmur; Peripheral vascular disease ([place]); Shortness of breath; Blood transfusion; GERD (gastroesophageal reflux disease); Headache(784.0); Neuromuscular disorder ([place]); Arthritis; Diverticulosis (07/22/2010); Family history of breast cancer; and Family history of colon cancer.     PAST SURGICAL HISTORY: Past Surgical History  Procedure Laterality Date  . Rotator cuff repair      rt  . Tonsillectomy    . Tubal ligation    . Craniotomy  03/31/11    left parietal mass resection  . Colostomy closure    . Tonsillectomy    . Colon surgery    . Portacath placement      10  . Craniotomy  11/10/2011    Procedure: CRANIOTOMY TUMOR EXCISION;  Surgeon: Erline Levine, MD;  Location: [facility];  Service: Neurosurgery;  Laterality: N/A;  Craniotomy for Biopsy of Tumor  . Colonoscopy  07/22/2010  . Breast lumpectomy with radioactive seed and sentinel lymph node biopsy Right 04/14/2015    Procedure: RIGHT BREAST LUMPECTOMY WITH RADIOACTIVE SEED ANDAXILLARY SENTINEL LYMPH NODE BIOPSY;  Surgeon: Jackolyn Confer, MD;  Location: [facility];  Service: General;  Laterality: Right;     FAMILY HISTORY: family history includes Breast cancer in her cousin; Breast cancer (age of onset: 59) in her mother; Cirrhosis in her brother; Colon cancer in her cousin; Coronary artery disease in her father; Diabetes in her father and maternal  aunt; Gout in her brother.   SOCIAL HISTORY:  reports that she quit smoking about 6 years ago. Her smoking use included Cigarettes. She has a 9.25 pack-year smoking history. She has never used smokeless tobacco. She reports that she does not drink alcohol or use illicit drugs.   ALLERGIES: Sulfonamide derivatives   MEDICATIONS:  Current Outpatient Prescriptions  Medication Sig Dispense Refill  . acetaminophen (TYLENOL) 500 MG tablet Take 1,000 mg by mouth every 6 (six) hours as needed for mild pain, moderate pain or headache.     . fluticasone (FLONASE) 50 MCG/ACT nasal spray Place 1 spray into both nostrils daily as needed for allergies.     [name] gabapentin (NEURONTIN) 300 MG capsule TAKE ONE CAPSULE BY MOUTH AT BEDTIME (Patient taking differently: TAKE 300 MG BY MOUTH AT BEDTIME) 90 capsule 0  . lisinopril (PRINIVIL,ZESTRIL) 40 MG tablet Take 1 tablet (40 mg total) by mouth daily. 90 tablet 0  . oxyCODONE (OXY IR/ROXICODONE) 5 MG immediate release tablet Take 1-2 tablets (5-10 mg total) by mouth every 4 (four) hours as needed for moderate pain, severe pain or breakthrough pain. (Patient not taking: Reported on 08/03/2015) 40 tablet 0   No current facility-administered medications for this encounter.     REVIEW OF SYSTEMS: Review systems the patient reports that she is doing very well. She is anxious for her Herta cutback. She denies any bowel or bladder dysfunction, chest pain, shortness of breath fevers or chills. She denies any nausea or vomiting and states that she is planning to undergo her port removal today. He does have chronic low back pain which is ongoing and states that her pain is a 1 out of 10 today. Complete review of systems is obtained and is otherwise negative.    PHYSICAL EXAM:   height is 5' 4"  (1.626 m) and weight is 183 lb 1.6 oz (83.054 kg). Her blood pressure is 120/74 and her pulse is 101. Her respiration is 16 and oxygen saturation is 100%.   Pain scale  1/10  Cardiovascular exam reveals a regular rate and rhythm no clicks rubs or murmurs auscultated. Chest is clear to auscultation bilaterally. Lymphatic assessment is performed and no palpable adenopathy is noted of the deep cervical, supraclavicular, or axillary chains bilaterally. Postoperative changes are noted of the right breast with her lumpectomy scar in the upper outer quadrant, her Port-A-Cath incision is over the upper inner quadrant. No palpable masses are otherwise identified and postsurgical changes are noted inferior to the incision lines. The left breast does not have any palpable abnormalities. No skin changes, bleeding or nipple discharge is identified.   ECOG = 0  0 - Asymptomatic (Fully active, able to carry on all predisease activities without restriction)  1 - Symptomatic but completely ambulatory (Restricted in physically strenuous activity but ambulatory and able to carry out work of a light or sedentary nature. For example, light housework, office work)  2 - Symptomatic, <50% in bed during the day (Ambulatory and capable of all self care but unable to carry out any work activities. Up and about more than 50% of waking hours)  3 - Symptomatic, >50% in bed, but not bedbound (Capable of  only limited self-care, confined to bed or chair 50% or more of waking hours)  4 - Bedbound (Completely disabled. Cannot carry on any self-care. Totally confined to bed or chair)  5 - Death   Eustace Pen MM, Creech RH, Tormey DC, et al. [phone]). "Toxicity and response criteria of the [facility]". [place] Oncol. 5 (6): 649-55   LABORATORY DATA:  Lab Results  Component Value Date   WBC 19.1* 07/24/2015   HGB 13.3 07/24/2015   HCT 40.3 07/24/2015   MCV 82.6 07/24/2015   PLT 288 07/24/2015   Lab Results  Component Value Date   NA 143 07/24/2015   K 3.9 07/24/2015   CL 108 05/29/2015   CO2 22 07/24/2015   Lab Results  Component Value Date   ALT 32 07/24/2015    AST 21 07/24/2015   ALKPHOS 104 07/24/2015   BILITOT 0.52 07/24/2015      RADIOGRAPHY: Mr [name] Wo Contrast  07/31/2015  CLINICAL DATA:  Stage IV colon cancer. Left parietal brain metastasis status post resection and SRS in 2012. Restaging. EXAM: MRI HEAD WITHOUT AND WITH CONTRAST TECHNIQUE: Multiplanar, multiecho pulse sequences of the brain and surrounding structures were obtained without and with intravenous contrast. CONTRAST:  60m MULTIHANCE GADOBENATE DIMEGLUMINE 529 MG/ML IV SOLN COMPARISON:  03/13/2015 FINDINGS: There is no evidence of acute infarct, mass, midline shift, or extra-axial fluid collection. T2 hyperintensity scattered throughout the white matter of both cerebral hemispheres are unchanged and nonspecific but may reflect a combination of chronic small vessel ischemia and post treatment changes. Sequelae of prior left parietal craniotomy are again identified with stable underlying encephalomalacia and chronic hemosiderin deposition. Dural thickening and enhancement at the resection site is unchanged and likely postoperative in nature. No abnormal enhancement is seen remote from the craniotomy. No suspicious skull lesions are identified. The orbits are unremarkable. A trace left mastoid effusion is noted. The paranasal sinuses are clear. Major intracranial vascular flow voids are preserved. IMPRESSION: Stable post treatment appearance of the brain. No evidence of recurrent intracranial metastatic disease. Electronically Signed   By: ALogan BoresM.D.   On: 07/31/2015 13:53   Ir Chest Fluoro  07/24/2015  CLINICAL DATA:  Indwelling surgically placed right-sided Port-A-Cath with difficulty in accessing the port. History of breast carcinoma. EXAM: CHEST FLUOROSCOPY TECHNIQUE: Real-time fluoroscopic evaluation of the chest was performed. Skin overlying the port reservoir was prepped with chlorhexidine. Multiple attempts were made to access the port. RADIOPHARMACEUTICALS:  None FLUOROSCOPY  TIME:  48 seconds COMPARISON:  None. FINDINGS: Fluoroscopy demonstrates a right-sided Port-A-Cath with the catheter tip entering the jugular vein and terminating near the expected confluence of bilateral brachiocephalic veins. Port tubing appears grossly intact by fluoroscopy. Despite multiple attempts by different operators, the port could not be successfully accessed. It may be either severely angulated in the pocket or completely flipped. Contrast injection was unable to be performed. IMPRESSION: Inability to successfully access the right chest Port-A-Cath for contrast injection. By tactile feel, the port is either severely angulated in the pocket or completely flipped over. The catheter tip lies in the expected position of the confluence of bilateral brachiocephalic veins. Electronically Signed   By: GAletta EdouardM.D.   On: 07/24/2015 17:32       IMPRESSION:  Oncology History   Breast cancer of upper-inner quadrant of right female breast ([place]   Staging form: Breast, AJCC 7th Edition     Clinical: Stage IIA (T2, N0, M0) - Unsigned  Pathologic stage from 04/14/2015: Stage IIA (T2, N0, cM0) - Signed by Truitt Merle, MD on 05/05/2015 Cancer of left colon [facility])   Staging form: Colon and Rectum, AJCC 7th Edition     Pathologic: T4a, N1a, M1 - Unsigned        Breast cancer of upper-inner quadrant of right female breast ([place])   02/27/2015 Mammogram Diagnostic mammogram Showed a 2.5 cm irregular mass within the far posterior upper inner right breast, ultrasound confirmed a 1.9 x 1.0 x 0.8 cm mass at 1:30 o'clock 15 submitted from nipple. No axillary adenopathy   03/03/2015 Initial Diagnosis Breast cancer of upper-inner quadrant of right female breast   03/03/2015 Initial Biopsy Right breast needle biopsy showed invasive ductal carcinoma, grade 1-2.   03/03/2015 Receptors her2 ER 100%+, PR 80%+, ki67 15%   04/14/2015 Surgery right breast lumpectomy and sentinel lymph node biopsy. Surgical margins were  negative.   04/14/2015 Pathology Results right breast invasive ductal carcinoma, grade 2, 2.7 cm,(+) DCIS, margins were negative, 4 sentinel lymph nodes and one axillary lymph nodes were negative, (+)lymphovascular invasion.   04/14/2015 Oncotype testing RS 30, which predicts 10-year risk of distance recurrence with tamoxifen alone 20%, intermediate risk   05/21/2015 -  Adjuvant Chemotherapy  Adjuvant chemotherapy with docetaxel 75 mg/m, and Cytoxan 600 mg/m, every 3 weeks, starting 05/21/2015, planning for total 4 cycles   05/26/2015 - 05/29/2015 Hospital Admission Pt was admitted for UTI ans syncope, treated with antibiotics and IVF     Stage IIa base of ductal carcinoma of the right breast.  PLAN: Dr. Lisbeth Renshaw discusses with the patient and her family member, the rationale for proceeding with adjuvant radiation therapy to the breast. He discusses with her the options for hyperfractionated radiation of the course of 4 weeks' time. We discussed the planning session, and expectations around her treatment cycle and schedule. She would like to move forward with this in the near future and we will set her up for simulation next week with the anticipation of starting radiation the following week. Consent is signed during today's evaluation. We will see her back once she is ready to begin simulation and following undertreat visits weekly.  The above documentation reflects my direct findings during this shared patient visit. Please see the separate note by Dr. Lisbeth Renshaw on this date for the remainder of the patient's plan of care.  Carola Rhine, PAC

## 2015-08-03 NOTE — Procedures (Signed)
Interventional Radiology Procedure Note  Procedure:  Port removal   Complications:  None  Estimated Blood Loss: < 10 mL  Right chest port removed in entirety.    Venetia Night. Kathlene Cote, M.D Pager:  [phone]

## 2015-08-03 NOTE — Progress Notes (Signed)
Location of Breast Cancer:invasive right breast cancer  Histology per Pathology Report:  Results: HER2 - NEGATIVE RATIO OF HER2/CEP17 SIGNALS 1.17 AVERAGE HER2 COPY NUMBER PER CELL 2.05 Reference Range: NEGATIVE HER2/CEP17 Ratio <2.0 and average HER2 copy number <4.0 EQUIVOCAL HER2/CEP17 Ratio <2.0 and average HER2 copy number >=4.0 and <6.0 POSITIVE HER2/CEP17 Ratio >=2.0 or <2.0 and average HER2 copy number >=6.0 Diagnosis 1. Breast, lumpectomy, right - INVASIVE GRADE II DUCTAL CARCINOMA, SPANNING 2.7 CM IN GREATEST DIMENSION. - ASSOCIATED INTERMEDIATE GRADE DUCTAL CARCINOMA IN SITU. - LYMPH/VASCULAR INVASION IS IDENTIFIED. - MARGINS ARE NEGATIVE. - SEE ONCOLOGY TEMPLATE. 2. Lymph node, sentinel, biopsy, right axillary - ONE BENIGN LYMPH NODE WITH NO TUMOR SEEN (0/1). 3. Lymph node, sentinel, biopsy, right axillary - ONE BENIGN LYMPH NODE WITH NO TUMOR SEEN (0/1). 4. Lymph node, sentinel, biopsy, right axillary 1 of 4 FINAL for MYIESHA, EDGAR A [phone]) Diagnosis(continued) - ONE BENIGN LYMPH NODE WITH NO TUMOR SEEN (0/1). 5. Lymph node, biopsy, right axillary - ONE BENIGN LYMPH NODE WITH NO TUMOR SEEN (0/1).  Receptor Status: ER(+), PR (+), Her2-neu (-)  Did patient present with symptoms (if so, please note symptoms) or was this found on screening mammography?: hx of metastatic colon cancer undergoing follow up imaging and the chest CT demonstrated a lesion in the upper inner quadrant of the right breast.  Past/Anticipated interventions by surgeon, if HWE:XHBZJ axillar lymphatic mapping. Right breast partial mastectomy after radioactive seed localization. Right axillar sentinel lymph node biopsy.   Past/Anticipated interventions by medical oncology, if any: Dr. Burr Medico recommends adjuvant chemotherapy with docetaxel and cytoxan, every three weeks for 4 cycles. Given ER/PR positivity she also, recommends adjuvant endocrine therapy with aromatase inhibitor after she completes  breast radiation.   Lymphedema issues, if any: No   Pain issues, if any:  Denies breast pain. Does reports chronic low back pain.  SAFETY ISSUES:  Prior radiation? yes  Pacemaker/ICD? no  Possible current pregnancy?no  Is the patient on methotrexate? no  Current Complaints / other details:  65 year old female.    Levy Pupa, RN 08/03/2015,7:23 AM

## 2015-08-03 NOTE — Sedation Documentation (Signed)
Resting with eyes closed.  Do not appear to be in distress.

## 2015-08-03 NOTE — Progress Notes (Signed)
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    ICD-9-CM ICD-10-CM   1. Breast cancer of upper-inner quadrant of right female breast ([place]) 174.2 C50.211   2. 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 She is without complaints. Notably, the patient recently had lumpectomy and lymph node dissection followed by 4 cycles of chemotherapy for Stage 1 breast cancer. Denies breast pain. Does report chronic low back pain.                          ALLERGIES:  is allergic to sulfonamide derivatives.  Meds: Current Outpatient Prescriptions  Medication Sig Dispense Refill  . acetaminophen (TYLENOL) 500 MG tablet Take 1,000 mg by mouth every 6 (six) hours as needed for mild pain, moderate pain or headache.     . fluticasone (FLONASE) 50 MCG/ACT nasal spray Place 1 spray into both nostrils daily as needed for allergies.     [name] gabapentin (NEURONTIN) 300 MG capsule TAKE ONE CAPSULE BY MOUTH AT BEDTIME (Patient taking differently: TAKE 300 MG BY MOUTH AT BEDTIME) 90 capsule 0  . lisinopril (PRINIVIL,ZESTRIL) 40 MG tablet Take 1 tablet (40 mg total) by mouth daily. 90 tablet 0  . oxyCODONE (OXY IR/ROXICODONE) 5 MG immediate release tablet Take 1-2 tablets (5-10 mg total) by mouth every 4 (four) hours as needed for moderate pain, severe pain or breakthrough pain. 40 tablet 0   No current facility-administered medications for this encounter.    Physical Findings: The patient is in  no acute distress. Patient is alert and oriented.  height is 5' 4" (1.626 m) and weight is 183 lb 1.6 oz (83.054 kg). Her blood pressure is 120/74 and her pulse is 101. Her respiration is 16 and oxygen saturation is 100%. .  No significant changes.   Lab Findings: Lab Results  Component Value Date   WBC 1.6* 08/03/2015   WBC 19.1* 07/24/2015   HGB 12.5 08/03/2015   HGB 13.3 07/24/2015   HCT 38.9 08/03/2015   HCT 40.3 07/24/2015   PLT 213 08/03/2015   PLT 288 07/24/2015    Lab Results  Component Value Date   NA 143 07/24/2015   NA 138 05/29/2015   NA 142 10/05/2010   K 3.9 07/24/2015   K 4.1 05/29/2015   K 4.0 10/05/2010   CHLORIDE 110* 07/24/2015   CO2 22 07/24/2015   CO2 25 05/29/2015   CO2 28 10/05/2010   GLUCOSE 130 07/24/2015   GLUCOSE 95 05/29/2015   GLUCOSE 121* 10/23/2012   GLUCOSE 134* 10/05/2010   BUN 15.1 07/24/2015   BUN 11 05/29/2015   BUN 13 10/05/2010   CREATININE 0.8 07/24/2015   CREATININE 0.81 05/29/2015   CREATININE 0.8 10/05/2010   BILITOT 0.52 07/24/2015   BILITOT 1.4* 05/27/2015   BILITOT 0.60 10/05/2010   ALKPHOS 104 07/24/2015   ALKPHOS 62 05/27/2015  ALKPHOS 184* 10/05/2010   AST 21 07/24/2015   AST 17 05/27/2015   AST 28 10/05/2010   ALT 32 07/24/2015   ALT 22 05/27/2015   ALT 26 10/05/2010   PROT 7.3 07/24/2015   PROT 5.4* 05/27/2015   PROT 8.0 10/05/2010   ALBUMIN 3.7 07/24/2015   ALBUMIN 2.8* 05/27/2015   ALBUMIN 3.3 10/05/2010   CALCIUM 10.2 07/24/2015   CALCIUM 8.8* 05/29/2015   CALCIUM 9.4 10/05/2010   ANIONGAP 11 07/24/2015   ANIONGAP 5 05/29/2015    Radiographic Findings: Mr [name] Wo Contrast  07/31/2015  CLINICAL DATA:  Stage IV colon cancer. Left parietal brain metastasis status post resection and SRS in 2012. Restaging. EXAM: MRI HEAD WITHOUT AND WITH CONTRAST TECHNIQUE: Multiplanar, multiecho pulse sequences of the brain and surrounding structures were obtained without and with intravenous contrast. CONTRAST:   59m MULTIHANCE GADOBENATE DIMEGLUMINE 529 MG/ML IV SOLN COMPARISON:  03/13/2015 FINDINGS: There is no evidence of acute infarct, mass, midline shift, or extra-axial fluid collection. T2 hyperintensity scattered throughout the white matter of both cerebral hemispheres are unchanged and nonspecific but may reflect a combination of chronic small vessel ischemia and post treatment changes. Sequelae of prior left parietal craniotomy are again identified with stable underlying encephalomalacia and chronic hemosiderin deposition. Dural thickening and enhancement at the resection site is unchanged and likely postoperative in nature. No abnormal enhancement is seen remote from the craniotomy. No suspicious skull lesions are identified. The orbits are unremarkable. A trace left mastoid effusion is noted. The paranasal sinuses are clear. Major intracranial vascular flow voids are preserved. IMPRESSION: Stable post treatment appearance of the brain. No evidence of recurrent intracranial metastatic disease. Electronically Signed   By: ALogan BoresM.D.   On: 07/31/2015 13:53   Ir Removal Tun Access W/ Port W/o Fl Mod Sed  08/03/2015  CLINICAL DATA:  History of colon carcinoma and more recently breast carcinoma. Indwelling surgically placed right chest Port-A-Cath could not be accessed for use or for a contrast injection study and request has been made to remove the port. EXAM: REMOVAL OF IMPLANTED TUNNELED PORT-A-CATH MEDICATIONS: Sedation:  3 mg IV Versed sec, 75 mcg IV fentanyl. Total Moderate Sedation Time:  14 minutes. During the procedure the patient was continuously monitored by Radiology nursing. 2 g IV Ancef. As antibiotic prophylaxis, Ancef was ordered pre-procedure and administered intravenously within one hour of incision. PROCEDURE: The right chest Port-A-Cath site was prepped with chlorhexidine. A sterile gown and gloves were worn during the procedure. Local anesthesia was provided with 1% lidocaine. An incision  was made overlying the Port-A-Cath with a #15 scalpel. Utilizing sharp and blunt dissection, the Port-A-Cath was removed. Portable cautery was utilized. Retention sutures were removed. The pocked was irrigated with sterile saline. Wound closure was performed with subcutaneous 3-0 Monocryl, subcuticular 4-0 Vicryl and Dermabond. The entire Port-A-Cath was removed successfully. COMPLICATIONS: None IMPRESSION: Removal of implanted Port-A-Cath utilizing sharp and blunt dissection. The procedure was uncomplicated. Electronically Signed   By: GAletta EdouardM.D.   On: 08/03/2015 16:53   Ir Chest Fluoro  07/24/2015  CLINICAL DATA:  Indwelling surgically placed right-sided Port-A-Cath with difficulty in accessing the port. History of breast carcinoma. EXAM: CHEST FLUOROSCOPY TECHNIQUE: Real-time fluoroscopic evaluation of the chest was performed. Skin overlying the port reservoir was prepped with chlorhexidine. Multiple attempts were made to access the port. RADIOPHARMACEUTICALS:  None FLUOROSCOPY TIME:  48 seconds COMPARISON:  None. FINDINGS: Fluoroscopy demonstrates a right-sided Port-A-Cath with the catheter tip entering  the jugular vein and terminating near the expected confluence of bilateral brachiocephalic veins. Port tubing appears grossly intact by fluoroscopy. Despite multiple attempts by different operators, the port could not be successfully accessed. It may be either severely angulated in the pocket or completely flipped. Contrast injection was unable to be performed. IMPRESSION: Inability to successfully access the right chest Port-A-Cath for contrast injection. By tactile feel, the port is either severely angulated in the pocket or completely flipped over. The catheter tip lies in the expected position of the confluence of bilateral brachiocephalic veins. Electronically Signed   By: Aletta Edouard M.D.   On: 07/24/2015 17:32    Impression:  No evidence of recurrence of brain met . The patient would  benefit from left breast radiotherapy for her early stage breast cancer.   Plan:  Will refer to Dr. Lisbeth Renshaw for left breast cancer treatment. MRI of the brain every 6 months and follow up with me.  _____________________________________  Sheral Apley. Tammi Klippel, M.D.   This document serves as a record of services personally performed by Tyler Pita, MD. It was created on his behalf by Jenell Milliner, a trained medical scribe. The creation of this record is based on the scribe's personal observations and the provider's statements to them. This document has been checked and approved by the attending provider.

## 2015-08-03 NOTE — Sedation Documentation (Signed)
Patient denies pain and is resting comfortably.  

## 2015-08-10 ENCOUNTER — Ambulatory Visit
Admission: RE | Admit: 2015-08-10 | Discharge: 2015-08-10 | Disposition: A | Discharge status: Home / Self Care | Payer: Medicare HMO | Source: Ambulatory Visit | Attending: Radiation Oncology | Admitting: Radiation Oncology

## 2015-08-10 DIAGNOSIS — C50211 Malignant neoplasm of upper-inner quadrant of right female breast: Secondary | ICD-10-CM | POA: Diagnosis not present

## 2015-08-10 DIAGNOSIS — Z510 Encounter for antineoplastic radiation therapy: Secondary | ICD-10-CM | POA: Diagnosis not present

## 2015-08-12 NOTE — Progress Notes (Signed)
  Radiation Oncology         [phone]) [phone] ________________________________  Name: MISCHELL BRANFORD MRN: [id number]  Date: 08/10/2015  DOB: May 08, 1951  Optical Surface Tracking Plan:  Since intensity modulated radiotherapy (IMRT) and 3D conformal radiation treatment methods are predicated on accurate and precise positioning for treatment, intrafraction motion monitoring is medically necessary to ensure accurate and safe treatment delivery.  The ability to quantify intrafraction motion without excessive ionizing radiation dose can only be performed with optical surface tracking. Accordingly, surface imaging offers the opportunity to obtain 3D measurements of patient position throughout IMRT and 3D treatments without excessive radiation exposure.  I am ordering optical surface tracking for this patient's upcoming course of radiotherapy. ________________________________  Kyung Rudd, MD 08/12/2015 11:17 PM    Reference:   Ursula Alert, J, et al. Surface imaging-based analysis of intrafraction motion for breast radiotherapy patients.Journal of [place], n. 6, nov. 2014. ISSN [id number].   Available at: <http://www.jacmp.org/index.php/jacmp/article/view/4957>.

## 2015-08-12 NOTE — Progress Notes (Signed)
  Radiation Oncology         [phone]) [phone] ________________________________  Name: Kayla [name] MRN: [id number]  Date: 08/10/2015  DOB: [birth year]   DIAGNOSIS:     ICD-9-CM ICD-10-CM   1. Breast cancer of upper-inner quadrant of right female breast ([place]) 174.2 C50.211     SIMULATION AND TREATMENT PLANNING NOTE  The patient presented for simulation prior to beginning her course of radiation treatment for her diagnosis of right-sided breast cancer. The patient was placed in a supine position on a breast board. A customized vac-lock bag was constructed and this complex treatment device will be used on a daily basis during her treatment. In this fashion, a CT scan was obtained through the chest area and an isocenter was placed near the chest wall within the breast.  The patient will be planned to receive a course of radiation initially to a dose of 42.5 Gy. This will consist of a whole breast radiotherapy technique. To accomplish this, 2 customized blocks have been designed which will correspond to medial and lateral whole breast tangent fields. This treatment will be accomplished at 2.5 Gy per fraction. A forward planning technique will also be evaluated to determine if this approach improves the plan. It is anticipated that the patient will then receive a 7.5 Gy boost to the seroma cavity which has been contoured. This will be accomplished at 2.5 Gy per fraction.   This initial treatment will consist of a 3-D conformal technique. The seroma has been contoured as the primary target structure. Additionally, dose volume histograms of both this target as well as the lungs and heart will also be evaluated. Such an approach is necessary to ensure that the target area is adequately covered while the nearby critical  normal structures are adequately spared.  Plan:  The final anticipated total dose therefore will correspond to 50 Gy.    _______________________________   Jodelle Gross, MD, PhD

## 2015-08-16 DIAGNOSIS — Z510 Encounter for antineoplastic radiation therapy: Secondary | ICD-10-CM | POA: Diagnosis not present

## 2015-08-16 DIAGNOSIS — C50211 Malignant neoplasm of upper-inner quadrant of right female breast: Secondary | ICD-10-CM | POA: Diagnosis not present

## 2015-08-17 ENCOUNTER — Ambulatory Visit
Admission: RE | Admit: 2015-08-17 | Discharge: 2015-08-17 | Disposition: A | Discharge status: Home / Self Care | Payer: Medicare HMO | Source: Ambulatory Visit | Attending: Radiation Oncology | Admitting: Radiation Oncology

## 2015-08-17 DIAGNOSIS — C50211 Malignant neoplasm of upper-inner quadrant of right female breast: Secondary | ICD-10-CM | POA: Diagnosis not present

## 2015-08-17 DIAGNOSIS — Z510 Encounter for antineoplastic radiation therapy: Secondary | ICD-10-CM | POA: Diagnosis not present

## 2015-08-18 ENCOUNTER — Ambulatory Visit
Admission: RE | Admit: 2015-08-18 | Discharge: 2015-08-18 | Disposition: A | Discharge status: Home / Self Care | Payer: Medicare HMO | Source: Ambulatory Visit | Attending: Radiation Oncology | Admitting: Radiation Oncology

## 2015-08-18 DIAGNOSIS — Z510 Encounter for antineoplastic radiation therapy: Secondary | ICD-10-CM | POA: Diagnosis not present

## 2015-08-18 DIAGNOSIS — C50211 Malignant neoplasm of upper-inner quadrant of right female breast: Secondary | ICD-10-CM | POA: Diagnosis not present

## 2015-08-18 MED ORDER — RADIAPLEXRX EX GEL
Freq: Once | CUTANEOUS | Status: AC
  Administered 2015-08-18: 17:00:00 via TOPICAL

## 2015-08-18 MED ORDER — ALRA NON-METALLIC DEODORANT (RAD-ONC)
1.0000 "application " | Freq: Once | TOPICAL | Status: AC
  Administered 2015-08-18: 1 via TOPICAL

## 2015-08-18 NOTE — Progress Notes (Signed)
Pt education done, radiation therapy and you book, alra and radiaplex skin products, my business card given to patient, discussed ways to manage side efects, fatigue,pain, skin irritation, increase protein  In diet, drink plenty of water stay hydrated, teach back given 4:19 PM

## 2015-08-19 ENCOUNTER — Ambulatory Visit
Admission: RE | Admit: 2015-08-19 | Discharge: 2015-08-19 | Disposition: A | Discharge status: Home / Self Care | Payer: Medicare HMO | Source: Ambulatory Visit | Attending: Radiation Oncology | Admitting: Radiation Oncology

## 2015-08-19 DIAGNOSIS — C50211 Malignant neoplasm of upper-inner quadrant of right female breast: Secondary | ICD-10-CM | POA: Diagnosis not present

## 2015-08-19 DIAGNOSIS — Z510 Encounter for antineoplastic radiation therapy: Secondary | ICD-10-CM | POA: Diagnosis not present

## 2015-08-20 ENCOUNTER — Ambulatory Visit
Admission: RE | Admit: 2015-08-20 | Discharge: 2015-08-20 | Disposition: A | Discharge status: Home / Self Care | Payer: Medicare HMO | Source: Ambulatory Visit | Attending: Radiation Oncology | Admitting: Radiation Oncology

## 2015-08-20 DIAGNOSIS — Z510 Encounter for antineoplastic radiation therapy: Secondary | ICD-10-CM | POA: Diagnosis not present

## 2015-08-20 DIAGNOSIS — C50211 Malignant neoplasm of upper-inner quadrant of right female breast: Secondary | ICD-10-CM | POA: Diagnosis not present

## 2015-08-21 ENCOUNTER — Ambulatory Visit
Admission: RE | Admit: 2015-08-21 | Discharge: 2015-08-21 | Disposition: A | Discharge status: Home / Self Care | Payer: Medicare HMO | Source: Ambulatory Visit | Attending: Radiation Oncology | Admitting: Radiation Oncology

## 2015-08-21 ENCOUNTER — Encounter: Payer: Self-pay | Admitting: Radiation Oncology

## 2015-08-21 VITALS — BP 128/82 | HR 88 | Temp 97.9°F | Resp 20 | Wt 184.6 lb

## 2015-08-21 DIAGNOSIS — Z510 Encounter for antineoplastic radiation therapy: Secondary | ICD-10-CM | POA: Diagnosis not present

## 2015-08-21 DIAGNOSIS — C50211 Malignant neoplasm of upper-inner quadrant of right female breast: Secondary | ICD-10-CM

## 2015-08-21 NOTE — Progress Notes (Signed)
Weekly rad txs right breast, 4/20 txs completd, no skin changes, skin intact, using rdiaplex bid, appetite good c/o sore throat  In the evenings,  3:01 PM' BP 128/82 mmHg  Pulse 88  Temp(Src) 97.9 F (36.6 C) (Oral)  Resp 20  Wt 184 lb 9.6 oz (83.734 kg)  Wt Readings from Last 3 Encounters:  08/21/15 184 lb 9.6 oz (83.734 kg)  08/03/15 181 lb 3.2 oz (82.192 kg)  08/03/15 183 lb 1.6 oz (83.054 kg)

## 2015-08-21 NOTE — Progress Notes (Signed)
   Department of Radiation Oncology  Phone:  [phone] Fax:        [phone]  Weekly Treatment Note    Name: Kayla [name] Date: 08/21/2015 MRN: [id number] DOB: 1950/10/25   Diagnosis:     ICD-9-CM ICD-10-CM   1. Breast cancer of upper-inner quadrant of right female breast ([place]) 174.2 C50.211      Current dose: 10 Gy  Current fraction: 4   MEDICATIONS: Current Outpatient Prescriptions  Medication Sig Dispense Refill  . acetaminophen (TYLENOL) 500 MG tablet Take 1,000 mg by mouth every 6 (six) hours as needed for mild pain, moderate pain or headache.     . fluticasone (FLONASE) 50 MCG/ACT nasal spray Place 1 spray into both nostrils daily as needed for allergies.     [name] gabapentin (NEURONTIN) 300 MG capsule TAKE ONE CAPSULE BY MOUTH AT BEDTIME (Patient taking differently: TAKE 300 MG BY MOUTH AT BEDTIME) 90 capsule 0  . hyaluronate sodium (RADIAPLEXRX) GEL Apply 1 application topically 2 (two) times daily.    [name] lisinopril (PRINIVIL,ZESTRIL) 40 MG tablet Take 1 tablet (40 mg total) by mouth daily. 90 tablet 0  . non-metallic deodorant (ALRA) MISC Apply 1 application topically daily as needed.    [name] oxyCODONE (OXY IR/ROXICODONE) 5 MG immediate release tablet Take 1-2 tablets (5-10 mg total) by mouth every 4 (four) hours as needed for moderate pain, severe pain or breakthrough pain. 40 tablet 0   No current facility-administered medications for this encounter.     ALLERGIES: Sulfonamide derivatives   LABORATORY DATA:  Lab Results  Component Value Date   WBC 1.6* 08/03/2015   HGB 12.5 08/03/2015   HCT 38.9 08/03/2015   MCV 84.9 08/03/2015   PLT 213 08/03/2015   Lab Results  Component Value Date   NA 143 07/24/2015   K 3.9 07/24/2015   CL 108 05/29/2015   CO2 22 07/24/2015   Lab Results  Component Value Date   ALT 32 07/24/2015   AST 21 07/24/2015   ALKPHOS 104 07/24/2015   BILITOT 0.52 07/24/2015     NARRATIVE: Kayla [name] was seen today for  weekly treatment management. The chart was checked and the patient's films were reviewed.  Weekly rad txs right breast, 4/20 txs completd, no skin changes, skin intact, using rdiaplex bid, appetite good c/o sore throat  In the evenings,  6:33 PM' BP 128/82 mmHg  Pulse 88  Temp(Src) 97.9 F (36.6 C) (Oral)  Resp 20  Wt 184 lb 9.6 oz (83.734 kg)  Wt Readings from Last 3 Encounters:  08/21/15 184 lb 9.6 oz (83.734 kg)  08/03/15 181 lb 3.2 oz (82.192 kg)  08/03/15 183 lb 1.6 oz (83.054 kg)     PHYSICAL EXAMINATION: weight is 184 lb 9.6 oz (83.734 kg). Her oral temperature is 97.9 F (36.6 C). Her blood pressure is 128/82 and her pulse is 88. Her respiration is 20.        ASSESSMENT: The patient is doing satisfactorily with treatment.  PLAN: We will continue with the patient's radiation treatment as planned.

## 2015-08-24 ENCOUNTER — Ambulatory Visit
Admission: RE | Admit: 2015-08-24 | Discharge: 2015-08-24 | Disposition: A | Discharge status: Home / Self Care | Payer: Medicare HMO | Source: Ambulatory Visit | Attending: Radiation Oncology | Admitting: Radiation Oncology

## 2015-08-24 DIAGNOSIS — Z510 Encounter for antineoplastic radiation therapy: Secondary | ICD-10-CM | POA: Diagnosis not present

## 2015-08-24 DIAGNOSIS — C50211 Malignant neoplasm of upper-inner quadrant of right female breast: Secondary | ICD-10-CM | POA: Diagnosis not present

## 2015-08-25 ENCOUNTER — Ambulatory Visit
Admission: RE | Admit: 2015-08-25 | Discharge: 2015-08-25 | Disposition: A | Discharge status: Home / Self Care | Payer: Medicare HMO | Source: Ambulatory Visit | Attending: Radiation Oncology | Admitting: Radiation Oncology

## 2015-08-25 ENCOUNTER — Telehealth: Payer: Self-pay | Admitting: *Deleted

## 2015-08-25 DIAGNOSIS — C50211 Malignant neoplasm of upper-inner quadrant of right female breast: Secondary | ICD-10-CM | POA: Diagnosis not present

## 2015-08-25 DIAGNOSIS — Z510 Encounter for antineoplastic radiation therapy: Secondary | ICD-10-CM | POA: Diagnosis not present

## 2015-08-25 NOTE — Telephone Encounter (Signed)
Left vm for pt to return call to assess needs during xrt. Contact information provided.

## 2015-08-26 ENCOUNTER — Ambulatory Visit
Admission: RE | Admit: 2015-08-26 | Discharge: 2015-08-26 | Disposition: A | Discharge status: Home / Self Care | Payer: Medicare HMO | Source: Ambulatory Visit | Attending: Radiation Oncology | Admitting: Radiation Oncology

## 2015-08-26 DIAGNOSIS — C50211 Malignant neoplasm of upper-inner quadrant of right female breast: Secondary | ICD-10-CM | POA: Diagnosis not present

## 2015-08-26 DIAGNOSIS — Z510 Encounter for antineoplastic radiation therapy: Secondary | ICD-10-CM | POA: Diagnosis not present

## 2015-08-27 ENCOUNTER — Ambulatory Visit
Admission: RE | Admit: 2015-08-27 | Discharge: 2015-08-27 | Disposition: A | Discharge status: Home / Self Care | Payer: Medicare HMO | Source: Ambulatory Visit | Attending: Radiation Oncology | Admitting: Radiation Oncology

## 2015-08-27 DIAGNOSIS — C50211 Malignant neoplasm of upper-inner quadrant of right female breast: Secondary | ICD-10-CM | POA: Diagnosis not present

## 2015-08-27 DIAGNOSIS — Z510 Encounter for antineoplastic radiation therapy: Secondary | ICD-10-CM | POA: Diagnosis not present

## 2015-08-28 ENCOUNTER — Ambulatory Visit
Admission: RE | Admit: 2015-08-28 | Discharge: 2015-08-28 | Disposition: A | Discharge status: Home / Self Care | Payer: Medicare HMO | Source: Ambulatory Visit | Attending: Radiation Oncology | Admitting: Radiation Oncology

## 2015-08-28 ENCOUNTER — Encounter: Payer: Self-pay | Admitting: Radiation Oncology

## 2015-08-28 VITALS — BP 150/86 | HR 97 | Temp 98.1°F | Resp 20 | Wt 184.3 lb

## 2015-08-28 DIAGNOSIS — C50211 Malignant neoplasm of upper-inner quadrant of right female breast: Secondary | ICD-10-CM

## 2015-08-28 DIAGNOSIS — Z510 Encounter for antineoplastic radiation therapy: Secondary | ICD-10-CM | POA: Diagnosis not present

## 2015-08-28 NOTE — Progress Notes (Signed)
   Department of Radiation Oncology  Phone:  [phone] Fax:        [phone]  Weekly Treatment Note    Name: Kayla [name] Date: 08/28/2015 MRN: [id number] DOB: 05-28-1951   Diagnosis:     ICD-9-CM ICD-10-CM   1. Breast cancer of upper-inner quadrant of right female breast ([place]) 174.2 C50.211      Current dose: 22.5 Gy  Current fraction: 9  MEDICATIONS: Current Outpatient Prescriptions  Medication Sig Dispense Refill  . acetaminophen (TYLENOL) 500 MG tablet Take 1,000 mg by mouth every 6 (six) hours as needed for mild pain, moderate pain or headache.     . fluticasone (FLONASE) 50 MCG/ACT nasal spray Place 1 spray into both nostrils daily as needed for allergies.     [name] gabapentin (NEURONTIN) 300 MG capsule TAKE ONE CAPSULE BY MOUTH AT BEDTIME (Patient taking differently: TAKE 300 MG BY MOUTH AT BEDTIME) 90 capsule 0  . hyaluronate sodium (RADIAPLEXRX) GEL Apply 1 application topically 2 (two) times daily.    [name] lisinopril (PRINIVIL,ZESTRIL) 40 MG tablet Take 1 tablet (40 mg total) by mouth daily. 90 tablet 0  . non-metallic deodorant (ALRA) MISC Apply 1 application topically daily as needed.    [name] oxyCODONE (OXY IR/ROXICODONE) 5 MG immediate release tablet Take 1-2 tablets (5-10 mg total) by mouth every 4 (four) hours as needed for moderate pain, severe pain or breakthrough pain. 40 tablet 0   No current facility-administered medications for this encounter.     ALLERGIES: Sulfonamide derivatives   LABORATORY DATA:  Lab Results  Component Value Date   WBC 1.6* 08/03/2015   HGB 12.5 08/03/2015   HCT 38.9 08/03/2015   MCV 84.9 08/03/2015   PLT 213 08/03/2015   Lab Results  Component Value Date   NA 143 07/24/2015   K 3.9 07/24/2015   CL 108 05/29/2015   CO2 22 07/24/2015   Lab Results  Component Value Date   ALT 32 07/24/2015   AST 21 07/24/2015   ALKPHOS 104 07/24/2015   BILITOT 0.52 07/24/2015     NARRATIVE: Kayla [name] was seen today for  weekly treatment management. The chart was checked and the patient's films were reviewed.  Weekly rad txs right breast 9/20  Completed, mild erythema ,skin intct, using radaiplex bid, tenderness in the nipple area, under inframmary fold tanning 1:35 PM BP 150/86 mmHg  Pulse 97  Temp(Src) 98.1 F (36.7 C) (Oral)  Resp 20  Wt 184 lb 4.8 oz (83.598 kg)  Wt Readings from Last 3 Encounters:  08/28/15 184 lb 4.8 oz (83.598 kg)  08/21/15 184 lb 9.6 oz (83.734 kg)  08/03/15 181 lb 3.2 oz (82.192 kg)    PHYSICAL EXAMINATION: weight is 184 lb 4.8 oz (83.598 kg). Her oral temperature is 98.1 F (36.7 C). Her blood pressure is 150/86 and her pulse is 97. Her respiration is 20.      mild hyperpigmentation present in the treatment area. No desquamation. Overall her skin looks very good.  ASSESSMENT: The patient is doing satisfactorily with treatment.  PLAN: We will continue with the patient's radiation treatment as planned.

## 2015-08-28 NOTE — Progress Notes (Signed)
Weekly rad txs right breast 9/20  Completed, mild erythema ,skin intct, using radaiplex bid, tenderness in the nipple area, under inframmary fold tanning 1:23 PM BP 150/86 mmHg  Pulse 97  Temp(Src) 98.1 F (36.7 C) (Oral)  Resp 20  Wt 184 lb 4.8 oz (83.598 kg)  Wt Readings from Last 3 Encounters:  08/28/15 184 lb 4.8 oz (83.598 kg)  08/21/15 184 lb 9.6 oz (83.734 kg)  08/03/15 181 lb 3.2 oz (82.192 kg)

## 2015-08-31 ENCOUNTER — Ambulatory Visit
Admission: RE | Admit: 2015-08-31 | Discharge: 2015-08-31 | Disposition: A | Discharge status: Home / Self Care | Payer: Medicare HMO | Source: Ambulatory Visit | Attending: Radiation Oncology | Admitting: Radiation Oncology

## 2015-08-31 DIAGNOSIS — C50211 Malignant neoplasm of upper-inner quadrant of right female breast: Secondary | ICD-10-CM | POA: Diagnosis not present

## 2015-08-31 DIAGNOSIS — Z510 Encounter for antineoplastic radiation therapy: Secondary | ICD-10-CM | POA: Diagnosis not present

## 2015-09-01 ENCOUNTER — Ambulatory Visit
Admission: RE | Admit: 2015-09-01 | Discharge: 2015-09-01 | Disposition: A | Discharge status: Home / Self Care | Payer: Medicare HMO | Source: Ambulatory Visit | Attending: Radiation Oncology | Admitting: Radiation Oncology

## 2015-09-01 DIAGNOSIS — C50211 Malignant neoplasm of upper-inner quadrant of right female breast: Secondary | ICD-10-CM | POA: Diagnosis not present

## 2015-09-01 DIAGNOSIS — Z510 Encounter for antineoplastic radiation therapy: Secondary | ICD-10-CM | POA: Diagnosis not present

## 2015-09-02 ENCOUNTER — Ambulatory Visit
Admission: RE | Admit: 2015-09-02 | Discharge: 2015-09-02 | Disposition: A | Discharge status: Home / Self Care | Payer: Medicare HMO | Source: Ambulatory Visit | Attending: Radiation Oncology | Admitting: Radiation Oncology

## 2015-09-02 DIAGNOSIS — Z510 Encounter for antineoplastic radiation therapy: Secondary | ICD-10-CM | POA: Diagnosis not present

## 2015-09-02 DIAGNOSIS — C50211 Malignant neoplasm of upper-inner quadrant of right female breast: Secondary | ICD-10-CM | POA: Diagnosis not present

## 2015-09-03 ENCOUNTER — Ambulatory Visit
Admission: RE | Admit: 2015-09-03 | Discharge: 2015-09-03 | Disposition: A | Discharge status: Home / Self Care | Payer: Medicare HMO | Source: Ambulatory Visit | Attending: Radiation Oncology | Admitting: Radiation Oncology

## 2015-09-03 DIAGNOSIS — Z510 Encounter for antineoplastic radiation therapy: Secondary | ICD-10-CM | POA: Diagnosis not present

## 2015-09-03 DIAGNOSIS — C50211 Malignant neoplasm of upper-inner quadrant of right female breast: Secondary | ICD-10-CM | POA: Diagnosis not present

## 2015-09-04 ENCOUNTER — Ambulatory Visit
Admission: RE | Admit: 2015-09-04 | Discharge: 2015-09-04 | Disposition: A | Discharge status: Home / Self Care | Payer: Medicare HMO | Source: Ambulatory Visit | Attending: Radiation Oncology | Admitting: Radiation Oncology

## 2015-09-04 ENCOUNTER — Ambulatory Visit: Admission: RE | Admit: 2015-09-04 | Payer: Medicare HMO | Source: Ambulatory Visit | Admitting: Radiation Oncology

## 2015-09-04 ENCOUNTER — Encounter: Payer: Self-pay | Admitting: Radiation Oncology

## 2015-09-04 DIAGNOSIS — C50211 Malignant neoplasm of upper-inner quadrant of right female breast: Secondary | ICD-10-CM | POA: Diagnosis not present

## 2015-09-04 DIAGNOSIS — Z510 Encounter for antineoplastic radiation therapy: Secondary | ICD-10-CM | POA: Diagnosis not present

## 2015-09-04 NOTE — Progress Notes (Signed)
Weekly rad txs tight breast , 14/20 completed, skin slight pink  Under inframmary fold a scant ares of skin broken, will start neosporin there, using radiplex cream bid,  Gave another tube There were no vitals taken for this visit.  Wt Readings from Last 3 Encounters:  08/28/15 184 lb 4.8 oz (83.598 kg)  08/21/15 184 lb 9.6 oz (83.734 kg)  08/03/15 181 lb 3.2 oz (82.192 kg)

## 2015-09-06 NOTE — Progress Notes (Signed)
   Department of Radiation Oncology  Phone:  [phone] Fax:        [phone]  Weekly Treatment Note    Name: Kayla [name] Date: 09/06/2015 MRN: [id number] DOB: 1951/02/05   Diagnosis:     ICD-9-CM ICD-10-CM   1. Breast cancer of upper-inner quadrant of right female breast ([place]) 174.2 C50.211      Current dose: 35 Gy  Current fraction: 14   MEDICATIONS: Current Outpatient Prescriptions  Medication Sig Dispense Refill  . acetaminophen (TYLENOL) 500 MG tablet Take 1,000 mg by mouth every 6 (six) hours as needed for mild pain, moderate pain or headache.     . fluticasone (FLONASE) 50 MCG/ACT nasal spray Place 1 spray into both nostrils daily as needed for allergies.     . hyaluronate sodium (RADIAPLEXRX) GEL Apply 1 application topically 2 (two) times daily.    [name] lisinopril (PRINIVIL,ZESTRIL) 40 MG tablet Take 1 tablet (40 mg total) by mouth daily. 90 tablet 0  . non-metallic deodorant (ALRA) MISC Apply 1 application topically daily as needed.    [name] oxyCODONE (OXY IR/ROXICODONE) 5 MG immediate release tablet Take 1-2 tablets (5-10 mg total) by mouth every 4 (four) hours as needed for moderate pain, severe pain or breakthrough pain. 40 tablet 0  . gabapentin (NEURONTIN) 300 MG capsule TAKE ONE CAPSULE BY MOUTH AT BEDTIME (Patient taking differently: TAKE 300 MG BY MOUTH AT BEDTIME) 90 capsule 0   No current facility-administered medications for this encounter.     ALLERGIES: Sulfonamide derivatives   LABORATORY DATA:  Lab Results  Component Value Date   WBC 1.6* 08/03/2015   HGB 12.5 08/03/2015   HCT 38.9 08/03/2015   MCV 84.9 08/03/2015   PLT 213 08/03/2015   Lab Results  Component Value Date   NA 143 07/24/2015   K 3.9 07/24/2015   CL 108 05/29/2015   CO2 22 07/24/2015   Lab Results  Component Value Date   ALT 32 07/24/2015   AST 21 07/24/2015   ALKPHOS 104 07/24/2015   BILITOT 0.52 07/24/2015     NARRATIVE: [name] was seen today for  weekly treatment management. The chart was checked and the patient's films were reviewed.  Weekly rad txs tight breast , 14/20 completed, skin slight pink  Under inframmary fold a scant ares of skin broken, will start neosporin there, using radiplex cream bid,  Gave another tube There were no vitals taken for this visit.  Wt Readings from Last 3 Encounters:  08/28/15 184 lb 4.8 oz (83.598 kg)  08/21/15 184 lb 9.6 oz (83.734 kg)  08/03/15 181 lb 3.2 oz (82.192 kg)    PHYSICAL EXAMINATION: vitals were not taken for this visit.     the patient's skin shows some hyperpigmentation. Early desquamation present in the inframammary region.  ASSESSMENT: The patient is doing satisfactorily with treatment.  PLAN: We will continue with the patient's radiation treatment as planned. The patient will continue current skin care and will begin using Neosporin on the affected areas underneath the breast.

## 2015-09-07 ENCOUNTER — Ambulatory Visit
Admission: RE | Admit: 2015-09-07 | Discharge: 2015-09-07 | Disposition: A | Discharge status: Home / Self Care | Payer: Medicare HMO | Source: Ambulatory Visit | Attending: Radiation Oncology | Admitting: Radiation Oncology

## 2015-09-07 DIAGNOSIS — C50211 Malignant neoplasm of upper-inner quadrant of right female breast: Secondary | ICD-10-CM | POA: Diagnosis not present

## 2015-09-07 DIAGNOSIS — Z510 Encounter for antineoplastic radiation therapy: Secondary | ICD-10-CM | POA: Diagnosis not present

## 2015-09-08 ENCOUNTER — Ambulatory Visit
Admission: RE | Admit: 2015-09-08 | Discharge: 2015-09-08 | Disposition: A | Discharge status: Home / Self Care | Payer: Medicare HMO | Source: Ambulatory Visit | Attending: Radiation Oncology | Admitting: Radiation Oncology

## 2015-09-08 DIAGNOSIS — C50211 Malignant neoplasm of upper-inner quadrant of right female breast: Secondary | ICD-10-CM | POA: Diagnosis not present

## 2015-09-08 DIAGNOSIS — Z510 Encounter for antineoplastic radiation therapy: Secondary | ICD-10-CM | POA: Diagnosis not present

## 2015-09-09 ENCOUNTER — Ambulatory Visit
Admission: RE | Admit: 2015-09-09 | Discharge: 2015-09-09 | Disposition: A | Discharge status: Home / Self Care | Payer: Medicare HMO | Source: Ambulatory Visit | Attending: Radiation Oncology | Admitting: Radiation Oncology

## 2015-09-09 DIAGNOSIS — C50211 Malignant neoplasm of upper-inner quadrant of right female breast: Secondary | ICD-10-CM | POA: Diagnosis not present

## 2015-09-09 DIAGNOSIS — Z510 Encounter for antineoplastic radiation therapy: Secondary | ICD-10-CM | POA: Diagnosis not present

## 2015-09-10 ENCOUNTER — Ambulatory Visit
Admission: RE | Admit: 2015-09-10 | Discharge: 2015-09-10 | Disposition: A | Discharge status: Home / Self Care | Payer: Medicare HMO | Source: Ambulatory Visit | Attending: Radiation Oncology | Admitting: Radiation Oncology

## 2015-09-10 DIAGNOSIS — Z510 Encounter for antineoplastic radiation therapy: Secondary | ICD-10-CM | POA: Diagnosis not present

## 2015-09-11 ENCOUNTER — Encounter: Payer: Self-pay | Admitting: Radiation Oncology

## 2015-09-11 ENCOUNTER — Ambulatory Visit
Admission: RE | Admit: 2015-09-11 | Discharge: 2015-09-11 | Disposition: A | Discharge status: Home / Self Care | Payer: Medicare HMO | Source: Ambulatory Visit | Attending: Radiation Oncology | Admitting: Radiation Oncology

## 2015-09-11 VITALS — BP 127/75 | HR 75 | Temp 97.8°F | Resp 20 | Wt 183.5 lb

## 2015-09-11 DIAGNOSIS — Z510 Encounter for antineoplastic radiation therapy: Secondary | ICD-10-CM | POA: Diagnosis not present

## 2015-09-11 DIAGNOSIS — C50211 Malignant neoplasm of upper-inner quadrant of right female breast: Secondary | ICD-10-CM

## 2015-09-11 NOTE — Progress Notes (Signed)
  Radiation Oncology         [phone]) [phone] ________________________________  Name: Kayla [name] MRN: [id number]  Date: 09/04/2015  DOB: 27-Nov-1950  Complex simulation note  The patient has undergone complex simulation for her upcoming boost treatment for her diagnosis of breast cancer. The patient has initially been planned to receive 42.5 Gy. The patient will now receive a 7.5 Gy boost to the seroma cavity which has been contoured. This will be accomplished using an en face electron field. Based on the depth of the target area, 12 MeV electrons will be used. The patient's final total dose therefore will be 50 Gy. A complex isodose plan from the electron [facility] Carlo calculation is requested for the boost treatment.   _______________________________  Jodelle Gross, MD, PhD

## 2015-09-11 NOTE — Progress Notes (Addendum)
Weekly rad txs  19/20 completed Right breast , erythema on subclavicular and axilla, and breast,under inframmary fold  Moist desquamtion about 2 inches in length, 1/16 in widow, using neosporin that area and radiaplex bid, not interested  [facility] lives near [place], 1 month f/u appt given 1:07 PM  BP 127/75 mmHg  Pulse 75  Temp(Src) 97.8 F (36.6 C) (Oral)  Resp 20  Wt 183 lb 8 oz (83.235 kg) Wt Readings from Last 3 Encounters:  08/28/15 184 lb 4.8 oz (83.598 kg)  08/21/15 184 lb 9.6 oz (83.734 kg)  08/03/15 181 lb 3.2 oz (82.192 kg)

## 2015-09-11 NOTE — Progress Notes (Signed)
   Department of Radiation Oncology  Phone:  [phone] Fax:        [phone]  Weekly Treatment Note    Name: Kayla [name] Date: 09/11/2015 MRN: [id number] DOB: 16-Oct-1950   Diagnosis:     ICD-9-CM ICD-10-CM   1. Breast cancer of upper-inner quadrant of right female breast ([place]) 174.2 C50.211      Current dose: 47.5 Gy  Current fraction: 19   MEDICATIONS: Current Outpatient Prescriptions  Medication Sig Dispense Refill  . acetaminophen (TYLENOL) 500 MG tablet Take 1,000 mg by mouth every 6 (six) hours as needed for mild pain, moderate pain or headache.     . fluticasone (FLONASE) 50 MCG/ACT nasal spray Place 1 spray into both nostrils daily as needed for allergies.     [name] gabapentin (NEURONTIN) 300 MG capsule TAKE ONE CAPSULE BY MOUTH AT BEDTIME (Patient taking differently: TAKE 300 MG BY MOUTH AT BEDTIME) 90 capsule 0  . hyaluronate sodium (RADIAPLEXRX) GEL Apply 1 application topically 2 (two) times daily.    [name] lisinopril (PRINIVIL,ZESTRIL) 40 MG tablet Take 1 tablet (40 mg total) by mouth daily. 90 tablet 0  . non-metallic deodorant (ALRA) MISC Apply 1 application topically daily as needed.    [name] oxyCODONE (OXY IR/ROXICODONE) 5 MG immediate release tablet Take 1-2 tablets (5-10 mg total) by mouth every 4 (four) hours as needed for moderate pain, severe pain or breakthrough pain. (Patient not taking: Reported on 09/11/2015) 40 tablet 0   No current facility-administered medications for this encounter.     ALLERGIES: Sulfonamide derivatives   LABORATORY DATA:  Lab Results  Component Value Date   WBC 1.6* 08/03/2015   HGB 12.5 08/03/2015   HCT 38.9 08/03/2015   MCV 84.9 08/03/2015   PLT 213 08/03/2015   Lab Results  Component Value Date   NA 143 07/24/2015   K 3.9 07/24/2015   CL 108 05/29/2015   CO2 22 07/24/2015   Lab Results  Component Value Date   ALT 32 07/24/2015   AST 21 07/24/2015   ALKPHOS 104 07/24/2015   BILITOT 0.52 07/24/2015      NARRATIVE: Kayla [name] was seen today for weekly treatment management. The chart was checked and the patient's films were reviewed.  Weekly rad txs  19/20 completed Right breast , erythema on subclavicular and axilla, and breast,under inframmary fold  Moist desquamtion about 2 inches in length, 1/16 in widow, using neosporin that area and radiaplex bid, not interested  [facility] lives near [place], 1 month f/u appt given 1:21 PM  BP 127/75 mmHg  Pulse 75  Temp(Src) 97.8 F (36.6 C) (Oral)  Resp 20  Wt 183 lb 8 oz (83.235 kg) Wt Readings from Last 3 Encounters:  09/11/15 183 lb 8 oz (83.235 kg)  08/28/15 184 lb 4.8 oz (83.598 kg)  08/21/15 184 lb 9.6 oz (83.734 kg)    PHYSICAL EXAMINATION: weight is 183 lb 8 oz (83.235 kg). Her oral temperature is 97.8 F (36.6 C). Her blood pressure is 127/75 and her pulse is 75. Her respiration is 20.      The patient has some early emerging moist desquamation in the inframammary region. With one more treatment, this should well.  ASSESSMENT: The patient is doing satisfactorily with treatment.  PLAN: We will continue with the patient's radiation treatment as planned. We will see the patient back in one month.

## 2015-09-14 ENCOUNTER — Ambulatory Visit
Admission: RE | Admit: 2015-09-14 | Discharge: 2015-09-14 | Disposition: A | Discharge status: Home / Self Care | Payer: Medicare HMO | Source: Ambulatory Visit | Attending: Radiation Oncology | Admitting: Radiation Oncology

## 2015-09-14 ENCOUNTER — Encounter: Payer: Self-pay | Admitting: *Deleted

## 2015-09-14 ENCOUNTER — Encounter: Payer: Self-pay | Admitting: Radiation Oncology

## 2015-09-14 DIAGNOSIS — Z510 Encounter for antineoplastic radiation therapy: Secondary | ICD-10-CM | POA: Diagnosis not present

## 2015-09-16 ENCOUNTER — Telehealth: Payer: Self-pay | Admitting: Hematology

## 2015-09-16 NOTE — Telephone Encounter (Signed)
Spoke with pt in regards to rescheduling appt. Rescheduled appt for 3/15. Pt confirmed time and date.

## 2015-09-16 NOTE — Telephone Encounter (Signed)
No flush added. Per pt, flush port taken out

## 2015-09-17 ENCOUNTER — Telehealth: Payer: Self-pay | Admitting: *Deleted

## 2015-09-17 NOTE — Telephone Encounter (Signed)
Pt called and left message requesting refills of her blood pressure meds.  Spoke with pt and instructed pt to call her PCP for blood pressure meds refills.  Pt voiced understanding. Pt's  Phone    [phone].

## 2015-09-18 ENCOUNTER — Other Ambulatory Visit: Payer: Medicare HMO

## 2015-09-18 ENCOUNTER — Ambulatory Visit: Payer: Medicare HMO | Admitting: Hematology

## 2015-09-23 DIAGNOSIS — Z6831 Body mass index (BMI) 31.0-31.9, adult: Secondary | ICD-10-CM | POA: Diagnosis not present

## 2015-09-23 DIAGNOSIS — N61 Mastitis without abscess: Secondary | ICD-10-CM | POA: Diagnosis not present

## 2015-09-23 DIAGNOSIS — G620 Drug-induced polyneuropathy: Secondary | ICD-10-CM | POA: Diagnosis not present

## 2015-09-23 DIAGNOSIS — Z713 Dietary counseling and surveillance: Secondary | ICD-10-CM | POA: Diagnosis not present

## 2015-09-23 DIAGNOSIS — J302 Other seasonal allergic rhinitis: Secondary | ICD-10-CM | POA: Diagnosis not present

## 2015-09-23 DIAGNOSIS — I10 Essential (primary) hypertension: Secondary | ICD-10-CM | POA: Diagnosis not present

## 2015-09-30 ENCOUNTER — Telehealth: Payer: Self-pay | Admitting: Hematology

## 2015-09-30 ENCOUNTER — Other Ambulatory Visit: Payer: Self-pay | Admitting: Adult Health

## 2015-09-30 ENCOUNTER — Encounter: Payer: Self-pay | Admitting: Hematology

## 2015-09-30 ENCOUNTER — Ambulatory Visit (HOSPITAL_BASED_OUTPATIENT_CLINIC_OR_DEPARTMENT_OTHER): Payer: Medicare HMO | Admitting: Hematology

## 2015-09-30 ENCOUNTER — Other Ambulatory Visit (HOSPITAL_BASED_OUTPATIENT_CLINIC_OR_DEPARTMENT_OTHER): Payer: Medicare HMO

## 2015-09-30 VITALS — BP 132/80 | HR 89 | Temp 97.8°F | Resp 18 | Ht 64.0 in | Wt 182.8 lb

## 2015-09-30 DIAGNOSIS — C50211 Malignant neoplasm of upper-inner quadrant of right female breast: Secondary | ICD-10-CM

## 2015-09-30 DIAGNOSIS — Z85038 Personal history of other malignant neoplasm of large intestine: Secondary | ICD-10-CM | POA: Diagnosis not present

## 2015-09-30 DIAGNOSIS — I10 Essential (primary) hypertension: Secondary | ICD-10-CM | POA: Diagnosis not present

## 2015-09-30 DIAGNOSIS — C7931 Secondary malignant neoplasm of brain: Secondary | ICD-10-CM

## 2015-09-30 DIAGNOSIS — Z170 Estrogen receptor positive status [ER+]: Secondary | ICD-10-CM

## 2015-09-30 LAB — COMPREHENSIVE METABOLIC PANEL
ALT: 37 U/L (ref 0–55)
AST: 35 U/L — AB (ref 5–34)
Albumin: 3.6 g/dL (ref 3.5–5.0)
Alkaline Phosphatase: 100 U/L (ref 40–150)
Anion Gap: 8 mEq/L (ref 3–11)
BUN: 9.3 mg/dL (ref 7.0–26.0)
CO2: 26 meq/L (ref 22–29)
Calcium: 9.5 mg/dL (ref 8.4–10.4)
Chloride: 107 mEq/L (ref 98–109)
Creatinine: 0.8 mg/dL (ref 0.6–1.1)
EGFR: 86 mL/min/{1.73_m2} — AB (ref >90)
GLUCOSE: 102 mg/dL (ref 70–140)
POTASSIUM: 4.1 meq/L (ref 3.5–5.1)
SODIUM: 141 meq/L (ref 136–145)
TOTAL PROTEIN: 7.1 g/dL (ref 6.4–8.3)
Total Bilirubin: 0.87 mg/dL (ref 0.20–1.20)

## 2015-09-30 LAB — CBC WITH DIFFERENTIAL/PLATELET
BASO%: 0.5 % (ref 0.0–2.0)
Basophils Absolute: 0 10*3/uL (ref 0.0–0.1)
EOS ABS: 0.2 10*3/uL (ref 0.0–0.5)
EOS%: 3.2 % (ref 0.0–7.0)
HCT: 42.8 % (ref 34.8–46.6)
HEMOGLOBIN: 13.7 g/dL (ref 11.6–15.9)
LYMPH%: 20.7 % (ref 14.0–49.7)
MCH: 27.2 pg (ref 25.1–34.0)
MCHC: 32 g/dL (ref 31.5–36.0)
MCV: 85.1 fL (ref 79.5–101.0)
MONO#: 0.5 10*3/uL (ref 0.1–0.9)
MONO%: 8.2 % (ref 0.0–14.0)
NEUT%: 67.4 % (ref 38.4–76.8)
NEUTROS ABS: 4.2 10*3/uL (ref 1.5–6.5)
Platelets: 218 10*3/uL (ref 145–400)
RBC: 5.03 10*6/uL (ref 3.70–5.45)
RDW: 15.8 % — AB (ref 11.2–14.5)
WBC: 6.2 10*3/uL (ref 3.9–10.3)
lymph#: 1.3 10*3/uL (ref 0.9–3.3)

## 2015-09-30 MED ORDER — LETROZOLE 2.5 MG PO TABS: 2.5000 mg | ORAL_TABLET | Freq: Every day | ORAL | Status: DC

## 2015-09-30 NOTE — Progress Notes (Signed)
Hematology and Oncology Follow Up Visit Date of Visit: 09/30/2015   Kayla [name] [id number] Apr 17, 1951 65 y.o. 09/30/2015   PCP: [place] [phone])  Principle Diagnosis:  1. Colon cancer, T4N1M1, Stage IV, diagnosed on 10/27/2008, brain recurrence in 03/2011  2. Stage IIA right breast cancer, diagnosed in 02/2015  Oncology History   Breast cancer of upper-inner quadrant of right female breast [facility])   Staging form: Breast, AJCC 7th Edition     Clinical: Stage IIA (T2, N0, M0) - Unsigned     Pathologic stage from 04/14/2015: Stage IIA (T2, N0, cM0) - Signed by Truitt Merle, MD on 05/05/2015 Cancer of left colon [facility])   Staging form: Colon and Rectum, AJCC 7th Edition     Pathologic: T4a, N1a, M1 - Unsigned        Breast cancer of upper-inner quadrant of right female breast ([place])   02/27/2015 Mammogram Diagnostic mammogram Showed a 2.5 cm irregular mass within the far posterior upper inner right breast, ultrasound confirmed a 1.9 x 1.0 x 0.8 cm mass at 1:30 o'clock 15 submitted from nipple. No axillary adenopathy   03/03/2015 Initial Diagnosis Breast cancer of upper-inner quadrant of right female breast   03/03/2015 Initial Biopsy Right breast needle biopsy showed invasive ductal carcinoma, grade 1-2.   03/03/2015 Receptors her2 ER 100%+, PR 80%+, ki67 15%   04/14/2015 Surgery right breast lumpectomy and sentinel lymph node biopsy. Surgical margins were negative.   04/14/2015 Pathology Results right breast invasive ductal carcinoma, grade 2, 2.7 cm,(+) DCIS, margins were negative, 4 sentinel lymph nodes and one axillary lymph nodes were negative, (+)lymphovascular invasion.   04/14/2015 Oncotype testing RS 30, which predicts 10-year risk of distance recurrence with tamoxifen alone 20%, intermediate risk   05/21/2015 - 07/24/2015 Adjuvant Chemotherapy  Adjuvant chemotherapy with docetaxel 75 mg/m, and Cytoxan 600 mg/m, every 3 weeks, starting 05/21/2015, planning for  total 4 cycles   05/26/2015 - 05/29/2015 Hospital Admission Pt was admitted for UTI ans syncope, treated with antibiotics and IVF    08/18/2015 - 09/14/2015 Radiation Therapy adjuvant breast radiation    Oncology History: Diagnosis of colon cancer dates back to 10/27/2008 when Kayla [name] presented with what turned out to be a bowel perforation through tumor. Stage at that time was T4 N1 M1 with pulmonary metastatic disease. The K-ras mutation was detected. Kayla [name] underwent surgical resection of her tumor with a colostomy at the time of her surgery on 10/27/2008. The colostomy has been subsequently reversed on 09/20/2010. Kayla [name] received chemotherapy consisting of FOLFOX for 10 treatments from 12/10/2008 through 06/02/2009. She achieved a partial remission from these treatments. She then received additional chemotherapy with 5-FU, leucovorin, and 5-FU by continuous infusion along with Avastin from 06/23/2009 through 11/17/2009. 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 She was doing well without evidence of disease until she developed a right hemiparesthesia in September 2012 and was found to have a 3 x 3 x 2.4 cm, lobulated, enhancing mass in the left parietal lobe with marked surrounding edema. A PET scan on 03/25/2011 showed resolution of the previously identified pulmonary nodules with no residual hypermetabolic activity. The pericardial effusion, which has been present since diagnosis, was unchanged. There were also uterine fibroids and a low-density lesion in the anterior spleen that was stable and not associated with any hypermetabolic activity. Of note, Kayla [name] had a markedly elevated CEA up to 24.7 on 03/23/2011. On 06/23/2011, the CEA was less than 0.5. Dr. Erline Levine resected the  recurrent metastatic colon cancer on 03/31/2011. This was followed by stereotactic radiation on 04/15/2011. The patient underwent a left-sided craniotomy and excision of the mass involving her  left parietal lobe on 11/10/2011 by Dr. Erline Levine. The pathology report was negative for any malignancy. Pathology report indicated benign brain with fibrosis, hemorrhage, hemosiderin deposition, abundant dystrophic calcifications, and mixed acute and chronic inflammation including foreign body multinucleated giant cells.    Prior Therapy:   1. Kayla [name] underwent surgical resection of her tumor with a colostomy at the time of her surgery on 10/27/2008. The colostomy has been subsequently reversed on 09/20/2010. Kayla [name] received chemotherapy consisting of FOLFOX for 10 treatments from 12/10/2008 through 06/02/2009. She achieved a partial remission from these treatments. She then received additional chemotherapy with 5-FU, leucovorin, and 5-FU by continuous infusion along with Avastin from 06/23/2009 through 11/17/2009. 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  2. See above for her breast cancer treatment history   Current therapy:  Pending adjuvant letrozole   Interim History:  Kayla [name] returns for follow-up. She completed right breast radiation on 09/14/2015. She tolerated well overall, they have skin breakdown 2 was in of the radiation. She was given a course of Keflex last week for the shallow skin ulcer in the right upper chest. She denies significant pain, or other discomfort. She has been doing well overall.   Past Medical History  Diagnosis Date  . Pericardial effusion     echo 08/13/09  . LVH (left ventricular hypertrophy)     mod/severe. echo 1/11. EF 65-70%   . Hypertension   . Thrombocytopenia ([place])   . 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 Allergy     sulfa  . Colon cancer ([place])     colon/ 2010/surg/chemo  . History of radiation therapy 04/15/11    17 Gy single fraction  l parietal brain metastais  . Heart murmur   . Peripheral vascular disease ([place])   . Shortness of breath   . Blood transfusion   . GERD (gastroesophageal  reflux disease)   . Headache(784.0)   . Neuromuscular disorder ([place])     peripheral neuropathy feet  . Arthritis   . Diverticulosis 07/22/2010    sigmoid colon  . Family history of breast cancer   . Family history of colon cancer    GYN HISTORY  Menarchal: 14 LMP: 33 Contraceptive: a few years  HRT: no  G2P2:    Medications: I have reviewed the patient's current medications.  Current Outpatient Prescriptions  Medication Sig Dispense Refill  . gabapentin (NEURONTIN) 300 MG capsule TAKE ONE CAPSULE BY MOUTH AT BEDTIME (Patient taking differently: TAKE 300 MG BY MOUTH AT BEDTIME) 90 capsule 0  . hyaluronate sodium (RADIAPLEXRX) GEL Apply 1 application topically 2 (two) times daily.    [name] lisinopril (PRINIVIL,ZESTRIL) 40 MG tablet Take 1 tablet (40 mg total) by mouth daily. 90 tablet 0  . non-metallic deodorant (ALRA) MISC Apply 1 application topically daily as needed.    [name] acetaminophen (TYLENOL) 500 MG tablet Take 1,000 mg by mouth every 6 (six) hours as needed for mild pain, moderate pain or headache. Reported on 09/30/2015    . cephALEXin (KEFLEX) 500 MG capsule Reported on 09/30/2015    . fluticasone (FLONASE) 50 MCG/ACT nasal spray Place 1 spray into both nostrils daily as needed for allergies. Reported on 09/30/2015    . letrozole (FEMARA) 2.5 MG tablet Take 1 tablet (2.5 mg  total) by mouth daily. 30 tablet 1  . oxyCODONE (OXY IR/ROXICODONE) 5 MG immediate release tablet Take 1-2 tablets (5-10 mg total) by mouth every 4 (four) hours as needed for moderate pain, severe pain or breakthrough pain. (Patient not taking: Reported on 09/11/2015) 40 tablet 0   No current facility-administered medications for this visit.   Allergies:  Allergies  Allergen Reactions  . Sulfonamide Derivatives Rash    Past Medical History, Surgical history, Social history, and Family History were reviewed and updated.  SMOKING HISTORY:  The patient has smoked intermittently, about 2 packs of cigarettes  per week, since she was a teenager, but stopped smoking in 2010.  Review of Systems: Constitutional:  Negative for fever, chills, night sweats, anorexia, weight loss, pain. Cardiovascular: no chest pain or dyspnea on exertion Respiratory: no cough, shortness of breath, or wheezing Neurological: no TIA or stroke symptoms Dermatological: negative for rash ENT: negative for - epistaxis, headaches, tinnitus, vertigo or visual changes Skin: Negative. Gastrointestinal: no abdominal pain, change in bowel habits, or black or bloody stools positive for - heartburn Genito-Urinary: no dysuria, trouble voiding, or hematuria Hematological and Lymphatic: negative for - bleeding problems, fatigue, jaundice or pallor Breast: negative for breast lumps Musculoskeletal: positive for - muscular weakness and pins and needles with some numbness (R>L) in lower extremities negative for - gait disturbance Remaining ROS negative.  Physical Exam: Blood pressure 132/80, pulse 89, temperature 97.8 F (36.6 C), temperature source Oral, resp. rate 18, height 5' 4"  (1.626 m), weight 182 lb 12.8 oz (82.918 kg), SpO2 100 %. ECOG: 0 General appearance: alert, cooperative, appears stated age, no distress and mildly obese Head: Normocephalic, without obvious abnormality, atraumatic Neck: no adenopathy, supple, symmetrical, trachea midline and thyroid not enlarged, symmetric, no tenderness/mass/nodules HEENT: PERRLA; EOMi; No sclerae icterus. OP clear of masses.  Lymph nodes: Cervical, supraclavicular, and axillary nodes normal. Heart:regular rate and rhythm, S1, S2 normal, no murmur, click, rub or gallop Lung:chest clear, no wheezing, rales, normal symmetric air entry,  + R sided port-a-cath. Abdomin: soft, non-tender, without masses or organomegaly, normal bowel sounds and surgical scars well healed. EXT:No peripheral edema.  R>L decreased sensation and mildly decreased strength (noted in prior exams) Neuro: R lower  extremity weakness and decreased sensation.  Normal gait.  Good finger to nose bilaterally. Otherwise no focal deficits. Breasts: Breast inspection showed them to be symmetrical with no nipple discharge. The surgical incision in right breast and axilla are well-healed, no tenderness. (+) Diffuse skin pigmentation of the right breast, (+) healing shallow skin ulcer in the right upper chest, (+) erythema, no discharge. Palpation of the breasts and axilla revealed no obvious mass that I could appreciate.    Lab Results: CBC Latest Ref Rng 09/30/2015 08/03/2015 07/24/2015  WBC 3.9 - 10.3 10e3/uL 6.2 1.6(L) 19.1(H)  Hemoglobin 11.6 - 15.9 g/dL 13.7 12.5 13.3  Hematocrit 34.8 - 46.6 % 42.8 38.9 40.3  Platelets 145 - 400 10e3/uL 218 213 288    CMP Latest Ref Rng 09/30/2015 07/24/2015 07/03/2015  Glucose 70 - 140 mg/dl 102 130 112  BUN 7.0 - 26.0 mg/dL 9.3 15.1 15.0  Creatinine 0.6 - 1.1 mg/dL 0.8 0.8 0.9  Sodium 136 - 145 mEq/L 141 143 142  Potassium 3.5 - 5.1 mEq/L 4.1 3.9 3.7  CO2 22 - 29 mEq/L 26 22 22   Calcium 8.4 - 10.4 mg/dL 9.5 10.2 9.9  Total Protein 6.4 - 8.3 g/dL 7.1 7.3 7.1  Total Bilirubin 0.20 - 1.20 mg/dL 0.87  0.52 0.54  Alkaline Phos 40 - 150 U/L 100 104 102  AST 5 - 34 U/L 35(H) 21 19  ALT 0 - 55 U/L 37 32 33   CEA  Status: Finalresult Visible to patient:  Not Released Nextappt: 08/11/2014 at 09:40 AM in Radiology (AP-MM 1) Dx:  Metastatic cancer to brain           Ref Range 88moago  623mogo  73m60moo  75yr6yr     CEA 0.0 - 5.0 ng/mL 0.9 1.3 1.4 1.1       PATHOLOGY REPORT Diagnosis 04/14/2015 1. Breast, lumpectomy, right - INVASIVE GRADE II DUCTAL CARCINOMA, SPANNING 2.7 CM IN GREATEST DIMENSION. - ASSOCIATED INTERMEDIATE GRADE DUCTAL CARCINOMA IN SITU. - LYMPH/VASCULAR INVASION IS IDENTIFIED. - MARGINS ARE NEGATIVE. - SEE ONCOLOGY TEMPLATE. 2. Lymph node, sentinel, biopsy, right axillary - ONE BENIGN LYMPH NODE WITH NO TUMOR SEEN (0/1). 3.  Lymph node, sentinel, biopsy, right axillary - ONE BENIGN LYMPH NODE WITH NO TUMOR SEEN (0/1). 4. Lymph node, sentinel, biopsy, right axillary - ONE BENIGN LYMPH NODE WITH NO TUMOR SEEN (0/1). 5. Lymph node, biopsy, right axillary - ONE BENIGN LYMPH NODE WITH NO TUMOR SEEN (0/1). Microscopic Comment 1. BREAST, INVASIVE TUMOR, WITH LYMPH NODES PRESENT Specimen, including laterality and lymph node sampling (sentinel, non-sentinel): Right partial breast with right sentinel lymph node sampling. Procedure: Right breast lumpectomy with right sentinel lymph node biopsies. Histologic type: Invasive ductal carcinoma. Grade: 2. Tubule formation: 3. Nuclear pleomorphism: 2. Mitotic: 1. Tumor size (gross measurement): 2.7 cm. Margins: Invasive, distance to closest margin: 0.2 cm (anterior margin). In-situ, distance to closest margin: At least 0.4 cm (all margins). Lymphovascular invasion: Yes, lymph/vascular invasion is identified. Ductal carcinoma in situ: Yes. Grade: Intermediate grade. Extensive intraductal component: No. Lobular neoplasia: Not identified. Tumor focality: Unifocal. Treatment effect: Not applicable. Extent of tumor: Tumor confined to breast parenchyma. Skin: Not received. Nipple: Not received. Skeletal muscle: Not received. Lymph nodes Examined: 3 Sentinel. 1 Non-sentinel. 4 Total. Lymph nodes with metastasis: 0. Isolated tumor cells (< 0.2 mm): 0. Micrometastasis: (> 0.2 mm and < 2.0 mm): 0. Macrometastasis: (> 2.0 mm): 0. Extracapsular extension: Not applicable. Breast prognostic profile: Performed on previous case [id number] receptor: 100%, positive. Progesterone receptor: 80%, positive. Her-2 neu: 1.30 ratio, negative. Ki-67: 15%. Non-neoplastic breast: Unremarkable. TNM: pT2, pN0. Comments: As Her-2 neu was previously negative, this will be repeated on representative tumor from the current specimen, and will be reported in an addendum to  follow. (RH:ds 04/15/15)  Results: HER2 - NEGATIVE RATIO OF HER2/CEP17 SIGNALS 1.17 AVERAGE HER2 COPY NUMBER PER CELL 2.05   Radiological Studies: 1. MRI of the head with and without IV contrast on 03/23/2011 showed a solitary enhancing mass in the left parietal lobe with surrounding white matter edema, most compatible with solitary metastatic deposit. The mass lesion measured 2.7 x 2.5 cm.  2. PET scan from 03/25/2011 showed resolution of the previously- identified pulmonary nodules with no residual hypermetabolic activity noted in the neck, chest, abdomen or pelvis to suggest active malignancy. There was a pericardial effusion and some subsegmental atelectasis in the lower lobes. There were also uterine fibroids and a low-density lesion in the anterior spleen that was stable and not associated with hypermetabolic activity.  3. MRI of the head with and without IV contrast showed Stealth protocol utilized to evaluate left parietal mass. The mass measured 3 x 3 x 2.4 cm with marked surrounding vasogenic edema.  4. MRI of  the head with and without IV contrast on 04/08/2011 showed postsurgical changes of tumor removal in the left parietal lobe and a postsurgical hematoma with enhancement. There was 3 mm of midline shift. No other metastatic deposits.  5. MRI of the head with and without IV contrast on 06/28/2011 showed a 3 cm area of restricted diffusion lying within the previous area of  left parietal surgical resection for colon cancer. There was enhancement of the peripheral aspect of the cavity, marked restricted diffusion and increasing vasogenic edema. There was concern about the development of a brain abscess.  6. MRI of the head with and without IV contrast on 07/29/2011 showed left parietal postoperative hematoma was smaller compared with the  prior study. There was no nodular enhancement seen to indicate tumor. No new enhancing lesions were seen. There was significant  improvement in the left  parietal edema.  7. Digital screening mammogram was negative on 08/02/2011.  8. An MRI of the head with and without IV contrast on 10/21/2011 showed development of gyriform enhancement circumferentially along the margins of the left parietal postoperative space/hematoma. Marked increase in vasogenic edema was seen. 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 There was further contraction of the hematoma/postoperative space in the left parietal cortical region, now measuring 17 x 19 mm in transverse diameter as opposed to 22 x 21 mm previously.  Left-to-right shift was 2 mm.  9. Nuclear medicine brain PET-CT scan carried out on 10/27/2011 showed hypermetabolic activity in the high left parietal lobe which corresponds to gyriform enhancement on comparison MRI from 10/21/2011. This was felt to be most consistent with recurrent  tumor.  10. Chest x-ray, 2 view from 11/04/2011, showed chronic cardiomegaly. Port-A-Cath was in place. No acute disease was present.  11. MRI of the head with and without IV contrast on 01/27/2012 showed findings that were felt to be consistent with progression of disease. This exam was compared with the MRI of 10/21/2011. It was recognized that the patient underwent surgical resection of the mass on 11/10/2011.  There was felt to be on the present exam residual peripheral enhancement of approximately 35 x 40 x 21 mm with infiltration of the cortex and deep white matter. It was felt that the enhancement had progressed, despite removal of the central focus. There was moderate vasogenic  edema throughout the left hemisphere but slightly decreased from the MRI from 10/21/2011. No new lesions were seen. There was mild atrophy with chronic microvascular ischemic change, but no midline shift. Major intracranial vascular structures were patent.  12. MRI of the brain with and without IV contrast on 05/04/2012 showed previous left parietal  lobe surgery for resection of tumor and radiation necrosis.  No new abnormalities were noted. 13. Chest x-ray, 2 view, on 07/03/2012 showed enlargement of the cardiac silhouette with some lingular scarring, as compared with the chest x-ray from 11/04/2011. 14. 2-D echocardiogram from 07/12/2012 showed left ventricular ejection fraction of 55-60%.  There was a small to moderate circumferential pericardial effusion with no signs of tamponade.  There was felt to be no significant change from the 2-D echocardiogram dated 08/13/2009. 15. Digital bilateral screening mammogram on 08/06/2012 was negative. 16. 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  There was no progression or new brain metastasis identified. 17. PET scan from 10/16/2012 showed no  specific features to suggest metastatic disease.  There was diffuse uptake throughout the thyroid gland, possibly due to hyperthyroidism. 18. MRI of the brain with and without IV contrast on 03/12/2013 showed residual gyriform enhancement surrounding the area of previous surgical resection of a left parietal metastasis with subsequent resection of radionecrosis. The findings likely represent some residual brain injury, but there is no significant progression of radionecrosis and no new lesions seen. 19. MRI of the brain with and without IV contrast on 11/01/2013 showed Stable posttherapy appearance of the brain since May of 2014. No progression or new brain metastasis identified. 20. CT chest,abomen and pelvis with contrast. 1. No evidence of thoracic metastasis.  2. Small prevascular lymph node is similar prior. 3. Stable pericardial effusion. 4. Interval increase in the volume presacral lymph node in the upper pelvis. This has progressed slowly from 2011. Recommend attention on follow-up versus restaging FDG PET scan.  5. Stable anastomosis in the descending colon. 21. CT chest, abdomen and pelvis with contrast 07/126/2015. 1. No evidence of local colon cancer recurrence or metastasis within the abdomen or pelvis. 2. Retention of stool at the colocolonic anastomosis in the left colon is unchanged comparison exam. No obstructing lesion identified.  22. Mesenteric lymph node is again demonstrated with no increased size. 4. Stable splenic lesion likely representing a complex cysts or hemangioma. 5. Stable small pericardial effusion 23. Brain MRI 11/11/2014: stable and satisfactory posttherapy appearance of the brain since 2014 and 2015. No new brain metastasis identified. 24. CT chest, abdomen and pelvis w contrast 02/02/2015: 1. No new or progressive findings to suggest recurrent colorectal, cancer in the chest, abdomen, or pelvis. 2. 16 mm soft tissue nodule in the medial right breast. Correlation with mammographic history recommended. 3. Fibroid change in the uterus. 4. Stable hypodense lesion in the anterior spleen, compatible with complex cyst or hemangioma. 5. Stable appearance of the trace pericardial effusion.   Impression and Plan: 65 year old African-American female, postmenopausal  1. Right breat invasive ductal carcinoma, pT2N0M0, stage IIA, ER+/PR+, HER2-, Oncotype RS 30 -I reviewed her surgical pathology findings with her in great details. -I reviewed her Oncotype DX test results. The recurrence score is 30, which predicts 10 year risk of distant recurrence 20% with tamoxifen alone. This is considered intermediate risk, but close to high risk group. -. Her recurrence score is very close to high risk group, and she is in good health overall, I recommend her to have adjuvant chemotherapy, with docetaxel and Cytoxan, intravenously every 3 weeks, for total of 4 cycles, which she has completed. -She now has completed adjuvant breast radiation -Given this strong ER/PR positivity, I recommend adjuvant endocrine  therapy with aromatase inhibitor letrozole. The potential benefit and side effects, which includes but not limited to, hot flash, skin and vaginal dryness, slight increased risk of cardiovascular disease, muscular and joint discomfort, osteopenia and osteoporosis, etc. were discussed with her in great details. She agrees to proceed. I recommend her to start in 2 weeks after her radiation dermatitis completely heals. -Given her history of stage IV colon cancer, she is not eligible for clinical trial [place] or [facility] [phone].   2.  HTN -she has restarted Lisinopril  -continue monitoring BP at home   3. Stage IV Colon Cancer mets to lung and brain, NED --Kayla [name] continues to do well with no evidence for disease recurrence.  She is now out  6 years from the time of diagnosis and almost 4 years from the time of diagnosis of  her right brain recurrence.   -She is clinically doing very well. Her CEA level has been normal.  - her last brain MRI in August 2016  Was negative for recurrenc -her recent restaging CT scans in 01/2015 showed no evidence of recurrence - she is clinically doing well, physical exam is unremarkable, we'll continue observation. -follow up with lab CBC, CMP and CEA every 3 months.  5. Peripheral neuropathy, grade 1, secondary to prior chemotherapy  -stable  6 Genetics -Given her positive family and personal history of breast (Mother at age of 26 and cousin) and colon cancer (cousin), she was referred to genetic counselor -Her genetic test was negative  She will continue follow-up with her primary care physician for other medical problems  Plan -I cautery and letrozole to her pharmacy today, she will start in 2 weeks -She will see survivorship clinic in 6 weeks, I'll see her back in 3 months  I spent 25 minutes counseling the patient face to face. The total time spent in the appointment was 30 minutes. She knows to call us if she has any concern or issues after the last cycle  chemo.    Truitt Merle  09/30/2015

## 2015-09-30 NOTE — Telephone Encounter (Signed)
cld & spoke to pt and gave pt time & date of apptt for 4/27 @ 10

## 2015-09-30 NOTE — Telephone Encounter (Signed)
Gave and printed appt sched and avs fo rpt for April and JUNE

## 2015-10-01 LAB — CEA: CEA1: 2.4 ng/mL (ref 0.0–4.7)

## 2015-10-01 LAB — CEA (PARALLEL TESTING): CEA: 1 ng/mL

## 2015-10-09 ENCOUNTER — Other Ambulatory Visit: Payer: Self-pay | Admitting: *Deleted

## 2015-10-09 ENCOUNTER — Telehealth: Payer: Self-pay | Admitting: *Deleted

## 2015-10-09 DIAGNOSIS — C7931 Secondary malignant neoplasm of brain: Secondary | ICD-10-CM

## 2015-10-09 DIAGNOSIS — C7949 Secondary malignant neoplasm of other parts of nervous system: Principal | ICD-10-CM

## 2015-10-09 NOTE — Telephone Encounter (Signed)
1.  Do you need a wheel chair?  NO  2. On oxygen? N  3. Have you ever had any surgery in the body part being scanned?  YES  4. Have you ever had any surgery on your brain or heart? NO HEART YES BRAIN                      5. Have you ever had surgery on your eyes or ears?     NO                         6. Do you have a pacemaker or defibrillator?  NO  7. Do you have a Neurostimulator?  NO  8. Claustrophobic?  NO  9. Any risk for metal in eyes? NO  10. Injury by bullet, buckshot, or shrapnel?  NO  11. Stent?  no                                                                                                                 12. Hx of Cancer? YES  13. Kidney or Liver disease?  NO  14. Hx of Lupus, Rheumatoid Arthritis or Scleroderma? NO  15. IV Antibiotics or long term use of NSAIDS?   NO  16. HX of Hypertension?  YES  17. Diabetes?  NO  18. Allergy to contrast?  NO  19. Recent labs.

## 2015-10-21 ENCOUNTER — Encounter: Payer: Self-pay | Admitting: Radiation Oncology

## 2015-10-26 DIAGNOSIS — Z01419 Encounter for gynecological examination (general) (routine) without abnormal findings: Secondary | ICD-10-CM | POA: Diagnosis not present

## 2015-10-26 DIAGNOSIS — N760 Acute vaginitis: Secondary | ICD-10-CM | POA: Diagnosis not present

## 2015-10-26 NOTE — Progress Notes (Signed)
°  Radiation Oncology         [phone]) [phone] ________________________________  Name: Kayla [name] MRN: [id number]  Date: 09/14/2015  DOB: [birth year]  End of Treatment Note  Diagnosis:   Right-sided breast cancer     Indication for treatment:  Curative       Radiation treatment dates:   08/17/15 - 08/24/15  Site/dose:   Right breast treated to 42.5 Gy in 17 fractions, Right breast boost treated to 7.5 Gy in 3 fractions  Beams/energy:   Right breast: 3D-Conformal / 10X,6X , Right breast boost: Electron Monte Carlo/ 12MeV  Narrative: The patient tolerated radiation treatment relatively well.     Plan: The patient has completed radiation treatment. The patient will return to radiation oncology clinic for routine followup in one month. I advised them to call or return sooner if they have any questions or concerns related to their recovery or treatment.  ------------------------------------------------  Jodelle Gross, MD, PhD   This document serves as a record of services personally performed by Kyung Rudd, MD. It was created on his behalf by Derek Mound, a trained medical scribe. The creation of this record is based on the scribe's personal observations and the provider's statements to them. This document has been checked and approved by the attending provider.

## 2015-10-29 ENCOUNTER — Ambulatory Visit
Admission: RE | Admit: 2015-10-29 | Discharge: 2015-10-29 | Disposition: A | Discharge status: Home / Self Care | Payer: Medicare HMO | Source: Ambulatory Visit | Attending: Radiation Oncology | Admitting: Radiation Oncology

## 2015-10-29 ENCOUNTER — Ambulatory Visit: Payer: Self-pay | Admitting: Radiation Oncology

## 2015-10-29 DIAGNOSIS — C50919 Malignant neoplasm of unspecified site of unspecified female breast: Secondary | ICD-10-CM | POA: Diagnosis not present

## 2015-10-29 DIAGNOSIS — C7931 Secondary malignant neoplasm of brain: Secondary | ICD-10-CM

## 2015-10-29 DIAGNOSIS — C7949 Secondary malignant neoplasm of other parts of nervous system: Principal | ICD-10-CM

## 2015-10-29 DIAGNOSIS — C189 Malignant neoplasm of colon, unspecified: Secondary | ICD-10-CM | POA: Diagnosis not present

## 2015-10-29 MED ORDER — GADOBENATE DIMEGLUMINE 529 MG/ML IV SOLN
17.0000 mL | Freq: Once | INTRAVENOUS | Status: AC | PRN
  Administered 2015-10-29: 17 mL via INTRAVENOUS

## 2015-11-02 ENCOUNTER — Encounter: Payer: Self-pay | Admitting: Radiation Oncology

## 2015-11-02 ENCOUNTER — Ambulatory Visit
Admission: RE | Admit: 2015-11-02 | Discharge: 2015-11-02 | Disposition: A | Discharge status: Home / Self Care | Payer: Medicare HMO | Source: Ambulatory Visit | Attending: Radiation Oncology | Admitting: Radiation Oncology

## 2015-11-02 VITALS — BP 142/82 | HR 79 | Temp 98.2°F | Resp 16 | Ht 64.0 in | Wt 184.3 lb

## 2015-11-02 DIAGNOSIS — C50211 Malignant neoplasm of upper-inner quadrant of right female breast: Secondary | ICD-10-CM

## 2015-11-02 HISTORY — DX: Personal history of irradiation: Z92.3

## 2015-11-02 NOTE — Progress Notes (Addendum)
Kayla [name] here for follow up.  She reports having occasional sharp pain in her right nipple area.  She also reports having  body aches that started on Friday.  She denies having a fever.  She denies having fatigue, headache or vision changes.  She reports occasionally feeling light headed when she gets up.  She is not taking decadron.  The skin on her right breast has hyperpigmentation.  She is using radiaplex prn.  She also mentioned that she is on the last day of a course of antibiotics.  She can't remember the name of the medication.  BP 142/82 mmHg  Pulse 79  Temp(Src) 98.2 F (36.8 C) (Oral)  Resp 16  Ht '5\' 4"'$  (1.626 m)  Wt 184 lb 4.8 oz (83.598 kg)  BMI 31.62 kg/m2  SpO2 99%   Wt Readings from Last 3 Encounters:  11/02/15 184 lb 4.8 oz (83.598 kg)  10/29/15 182 lb (82.555 kg)  09/30/15 182 lb 12.8 oz (82.918 kg)

## 2015-11-02 NOTE — Progress Notes (Signed)
Radiation Oncology         [phone]) [phone] ________________________________  Name: Kayla [name] MRN: [id number]  Date: 11/02/2015  DOB: April 28, 1951  Follow-Up Visit Note  CC: No PCP Per Patient  Truitt Merle, MD  Diagnosis: Breast cancer of upper-inner quadrant of right female breast [facility])   Staging form: Breast, AJCC 7th Edition     Clinical: Stage IIA (T2, N0, M0) - Unsigned     Pathologic stage from 04/14/2015: Stage IIA (T2, N0, cM0) - Signed by Truitt Merle, MD on 05/05/2015 Cancer of left colon [facility])   Staging form: Colon and Rectum, AJCC 7th Edition     Pathologic: T4a, N1a, M1 - Unsigned  Interval Since Last Radiation: 2 months  08/17/15 - 08/24/15: Right breast treated to 42.5 Gy in 17 fractions, Right breast boost treated to 7.5 Gy in 3 fractions  Narrative:  The patient returns today for routine follow-up. MRI of the brain on 10/29/15 showed continued stable and posttherapy appearance of the brain and no new intracranial abnormalities. The patient saw Dr. Burr Medico in March who started her on Letrozole.  She reports having occasional sharp pain in her right nipple area. She also reports having body aches that started on Friday. She denies a fever, fatigue, headache, or vision changes. She reports occasionally feeling light headed when she gets up. She is not taking decadron. The skin on her right breast has hyperpigmentation. She is using radiaplex prn. She also mentioned that she is on the last day of a course of antibiotics. She can't remember the name of the medication.  ALLERGIES:  is allergic to sulfonamide derivatives.  Meds: Current Outpatient Prescriptions  Medication Sig Dispense Refill  . acetaminophen (TYLENOL) 500 MG tablet Take 1,000 mg by mouth every 6 (six) hours as needed for mild pain, moderate pain or headache. Reported on 09/30/2015    . gabapentin (NEURONTIN) 300 MG capsule TAKE ONE CAPSULE BY MOUTH AT BEDTIME (Patient taking differently: TAKE 300 MG BY MOUTH AT BEDTIME) 90  capsule 0  . hyaluronate sodium (RADIAPLEXRX) GEL Apply 1 application topically 2 (two) times daily.    [name] letrozole (FEMARA) 2.5 MG tablet Take 1 tablet (2.5 mg total) by mouth daily. 30 tablet 1  . lisinopril (PRINIVIL,ZESTRIL) 40 MG tablet Take 1 tablet (40 mg total) by mouth daily. 90 tablet 0  . fluticasone (FLONASE) 50 MCG/ACT nasal spray Place 1 spray into both nostrils daily as needed for allergies. Reported on 9/52/8413    . non-metallic deodorant Jethro Poling) MISC Apply 1 application topically daily as needed. Reported on 11/02/2015    . oxyCODONE (OXY IR/ROXICODONE) 5 MG immediate release tablet Take 1-2 tablets (5-10 mg total) by mouth every 4 (four) hours as needed for moderate pain, severe pain or breakthrough pain. (Patient not taking: Reported on 09/11/2015) 40 tablet 0   No current facility-administered medications for this encounter.    Physical Findings: The patient is in no acute distress. Patient is alert and oriented.  height is '5\' 4"'$  (1.626 m) and weight is 184 lb 4.8 oz (83.598 kg). Her oral temperature is 98.2 F (36.8 C). Her blood pressure is 142/82 and her pulse is 79. Her respiration is 16 and oxygen saturation is 99%. .    Residual hypepigmentation in the right chest area; skin has healed well.  Lab Findings: Lab Results  Component Value Date   WBC 6.2 09/30/2015   HGB 13.7 09/30/2015   HCT 42.8 09/30/2015   MCV 85.1 09/30/2015  PLT 218 09/30/2015     Radiographic Findings: Mr [name] BX Contrast  10/29/2015  CLINICAL DATA:  65 year old female with stage IV colon cancer Recently diagnosed With new stage I breast cancer. 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 EXAM: MRI HEAD WITHOUT AND WITH CONTRAST TECHNIQUE: Multiplanar, multiecho pulse sequences of the brain and surrounding structures were obtained without and with intravenous  contrast. CONTRAST:  59m MULTIHANCE GADOBENATE DIMEGLUMINE 529 MG/ML IV SOLN COMPARISON:  07/31/2015 and earlier FINDINGS: Left parietal lobe post treatment site with unchanged mild residual stellate postcontrast enhancement underlying the craniotomy compared to 2016. Chronic T2 and FLAIR hyperintensity in the region also unchanged. Stable appearance of overlying craniotomy. No other abnormal intracranial enhancement. Major intracranial vascular flow voids are stable. Stable cerebral volume. No restricted diffusion to suggest acute infarction. No midline shift, ventriculomegaly, extra-axial collection or acute intracranial hemorrhage. Cervicomedullary junction and pituitary are within normal limits. Negative visualized cervical spine and spinal cord. Scattered nonenhancing cerebral white matter T2 and FLAIR hyperintensity is me moderately advanced for age but nonspecific and stable. No new intracranial blood products. Stable visible bone marrow signal. Visible internal auditory structures appear normal. Stable paranasal sinuses and mastoids. Stable orbit and scalp soft tissues. IMPRESSION: Continued stable and satisfactory posttherapy appearance of the brain. No new intracranial abnormality. Electronically Signed   By: HGenevie AnnM.D.   On: 10/29/2015 13:36    Impression:    The patient has done satisfactorily since finishing treatment.    Plan: The patient is scheduled to see Survivorship later this month and follow up with Dr. FBurr Medicoin June. She will have an MRI of the brain in 6 months and follow up with myself afterwards. She has done excellent with respect to her brain metastasis. She is also done very well in terms of recovering from her recent course of traditional external beam radiation treatment for her breast cancer.   JJodelle Gross M.D., Ph.D.  This document serves as a record of services personally performed by JKyung Rudd MD. It was created on his behalf by JDarcus Austin a trained medical  scribe. The creation of this record is based on the scribe's personal observations and the provider's statements to them. This document has been checked and approved by the attending provider.

## 2015-11-12 ENCOUNTER — Encounter: Payer: Self-pay | Admitting: Nurse Practitioner

## 2015-11-12 ENCOUNTER — Ambulatory Visit (HOSPITAL_BASED_OUTPATIENT_CLINIC_OR_DEPARTMENT_OTHER): Payer: Medicare HMO | Admitting: Nurse Practitioner

## 2015-11-12 VITALS — BP 131/81 | HR 88 | Temp 98.2°F | Resp 20 | Ht 64.0 in | Wt 184.0 lb

## 2015-11-12 DIAGNOSIS — Z170 Estrogen receptor positive status [ER+]: Secondary | ICD-10-CM | POA: Diagnosis not present

## 2015-11-12 DIAGNOSIS — C50211 Malignant neoplasm of upper-inner quadrant of right female breast: Secondary | ICD-10-CM | POA: Diagnosis not present

## 2015-11-12 DIAGNOSIS — Z85038 Personal history of other malignant neoplasm of large intestine: Secondary | ICD-10-CM | POA: Diagnosis not present

## 2015-11-12 DIAGNOSIS — Z79811 Long term (current) use of aromatase inhibitors: Secondary | ICD-10-CM | POA: Diagnosis not present

## 2015-11-12 NOTE — Progress Notes (Signed)
CLINIC:  Cancer Survivorship   REASON FOR VISIT:  Routine follow-up post-treatment for a recent history of breast cancer.  BRIEF ONCOLOGIC HISTORY:  Oncology History   Breast cancer of upper-inner quadrant of right female breast [facility])   Staging form: Breast, AJCC 7th Edition     Clinical: Stage IIA (T2, N0, M0) - Unsigned     Pathologic stage from 04/14/2015: Stage IIA (T2, N0, cM0) - Signed by Truitt Merle, MD on 05/05/2015 Cancer of left colon [facility])   Staging form: Colon and Rectum, AJCC 7th Edition     Pathologic: T4a, N1a, M1 - Unsigned        Breast cancer of upper-inner quadrant of right female breast ([place])   02/27/2015 Mammogram Diagnostic mammogram showed a 2.5 cm irregular mass within the far posterior upper inner right breast, ultrasound confirmed a 1.9 x 1.0 x 0.8 cm mass at 1:30 o'clock 15 submitted from nipple. No axillary adenopathy   03/03/2015 Initial Biopsy Right breast needle biopsy showed invasive ductal carcinoma, grade 1-2.   03/03/2015 Receptors her2 ER 100%+, PR 80%+, ki67 15%, HER2/neu negative   03/03/2015 Clinical Stage Stage IIA: T2 N0   03/18/2015 Procedure VUS at POLD1 gene called c.327G>C. neg at APC, ATM, AXIN2, BARD1, BMPR1A, BRCA1, BRCA2, BRIP1, CDH1, CDK4, CDKN2A, CHEK2, EPCAM, FANCC, MLH1, MSH2, MSH6, MUTYH, NBN, PALB2, PMS2, POLD1, POLE, PTEN, RAD51C, RAD51D, SCG5/GREM1, SMAD4, STK11, TP53, VHL,XRCC2   04/14/2015 Surgery Right breast lumpectomy and sentinel lymph node biopsy. Surgical margins were negative.   04/14/2015 Pathology Results right breast invasive ductal carcinoma, grade 2, 2.7 cm,(+) DCIS, margins were negative, 4 sentinel lymph nodes and one axillary lymph nodes were negative, (+)lymphovascular invasion.   04/14/2015 Pathologic Stage Stage IIA: T2 N0   04/14/2015 Oncotype testing RS 30, which predicts 10-year risk of distance recurrence with tamoxifen alone 20%, intermediate risk   05/21/2015 - 07/24/2015 Adjuvant Chemotherapy  Adjuvant chemotherapy  with docetaxel 75 mg/m, and Cytoxan 600 mg/m X 4 cycles   05/26/2015 - 05/29/2015 Hospital Admission Pt was admitted for UTI and syncope, treated with antibiotics and IVF    08/17/2015 - 09/13/2015 Radiation Therapy Adjuvant RT: Right breast treated to 42.5 Gy in 17 fractions, Right breast boost treated to 7.5 Gy in 3 fractions   10/14/2015 -  Anti-estrogen oral therapy Letrozole 2.5 mg daily    INTERVAL HISTORY:  Kayla [name] presents to the [facility] today for our initial meeting to review her survivorship care plan detailing her treatment course for breast cancer, as well as monitoring long-term side effects of that treatment, education regarding health maintenance, screening, and overall wellness and health promotion.     Overall, Kayla [name] reports feeling pretty well since completing her radiation therapy approximately two months ago.  She has some continued fatigue, but states it is improving.  She denies any mass or lesion in her breast. She has begun her letrozole and thus far, is tolerating it fairly well with some hot flashes.  She states that they are short lived and manageable at this time.  She has had some difficulty falling asleep. She denies any headache, cough, shortness of breath or bone pain. 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 She has a good appetite and denies any weight loss.  Of note, her colorectal cancer with mets to lung and brain is stable with no evidence of recurrence per Dr. Burr Medico.  REVIEW OF SYSTEMS:  General:  Fatigue and hot flashes as above. Denies fever, chills, unintentional weight loss, or night sweats.  HEENT: Denies visual changes, hearing loss, mouth sores or difficulty swallowing. Cardiac: Denies palpitations, chest pain, and lower extremity edema.  Respiratory: Denies wheeze or dyspnea on exertion.  Breast: As above. GI: Denies abdominal pain, constipation, diarrhea,  nausea, or vomiting.  GU: Denies dysuria, hematuria, vaginal bleeding, vaginal discharge, or vaginal dryness.  Musculoskeletal: Denies joint or bone pain.  Neuro: As above. Skin: Denies rash, pruritis, or open wounds.  Psych: Denies depression, anxiety, insomnia, or memory loss.   A 14-point review of systems was completed and was negative, except as noted above.   ONCOLOGY TREATMENT TEAM:  1. Surgeon:  Dr. Zella Richer at [facility]  2. Medical Oncologist: Dr. Burr Medico 3. Radiation Oncologist: Dr. Lisbeth Renshaw    PAST MEDICAL/SURGICAL HISTORY:  Past Medical History  Diagnosis Date  . Pericardial effusion     echo 08/13/09  . LVH (left ventricular hypertrophy)     mod/severe. echo 1/11. EF 65-70%   . Hypertension   . Thrombocytopenia ([place])   . Brain cancer ([place])     2.7cm l parietal brain metastasis  . Allergy     sulfa  . Colon cancer ([place])     colon/ 2010/surg/chemo  . History of radiation therapy 04/15/11    17 Gy single fraction  l parietal brain metastais  . Heart murmur   . Peripheral vascular disease ([place])   . Shortness of breath   . Blood transfusion   . GERD (gastroesophageal reflux disease)   . Headache(784.0)   . Neuromuscular disorder ([place])     peripheral neuropathy feet  . Arthritis   . Diverticulosis 07/22/2010    sigmoid colon  . Family history of breast cancer   . Family history of colon cancer   . S/P radiation therapy 08/18/15-09/14/15    right breast   Past Surgical History  Procedure Laterality Date  . Rotator cuff repair      rt  . Tonsillectomy    . Tubal ligation    . Craniotomy  03/31/11    left parietal mass resection  . Colostomy closure    . Tonsillectomy    . Colon surgery    . Portacath placement      10  . Craniotomy  11/10/2011    Procedure: CRANIOTOMY TUMOR EXCISION;  Surgeon: Erline Levine, MD;  Location: [place] [facility];  Service: Neurosurgery;  Laterality: N/A;  Craniotomy for Biopsy of Tumor  . Colonoscopy  07/22/2010  .  Breast lumpectomy with radioactive seed and sentinel lymph node biopsy Right 04/14/2015    Procedure: RIGHT BREAST LUMPECTOMY WITH RADIOACTIVE SEED ANDAXILLARY SENTINEL LYMPH NODE BIOPSY;  Surgeon: Jackolyn Confer, MD;  Location: [place];  Service: General;  Laterality: Right;     ALLERGIES:  Allergies  Allergen Reactions  . Sulfonamide Derivatives Rash     CURRENT MEDICATIONS:  Current Outpatient Prescriptions on File Prior to Visit  Medication Sig Dispense Refill  . acetaminophen (TYLENOL) 500 MG tablet Take 1,000 mg by mouth every 6 (six) hours as needed for mild pain, moderate pain or headache. Reported on 09/30/2015    . gabapentin (NEURONTIN) 300 MG capsule TAKE ONE CAPSULE BY MOUTH AT BEDTIME (Patient taking differently: TAKE 300 MG BY MOUTH AT BEDTIME) 90 capsule 0  . letrozole (FEMARA) 2.5 MG tablet Take 1 tablet (2.5 mg total) by mouth daily. 30 tablet 1  . lisinopril (PRINIVIL,ZESTRIL) 40 MG tablet Take  1 tablet (40 mg total) by mouth daily. 90 tablet 0  . fluticasone (FLONASE) 50 MCG/ACT nasal spray Place 1 spray into both nostrils daily as needed for allergies. Reported on 11/03/6220    . non-metallic deodorant Jethro Poling) MISC Apply 1 application topically daily as needed. Reported on 11/12/2015    . oxyCODONE (OXY IR/ROXICODONE) 5 MG immediate release tablet Take 1-2 tablets (5-10 mg total) by mouth every 4 (four) hours as needed for moderate pain, severe pain or breakthrough pain. (Patient not taking: Reported on 09/11/2015) 40 tablet 0   No current facility-administered medications on file prior to visit.     ONCOLOGIC FAMILY HISTORY:  Family History  Problem Relation Age of Onset  . Breast cancer Mother 42  . Diabetes Father   . Coronary artery disease Father   . Gout Brother   . Breast cancer Cousin     dx in her 52s  . Colon cancer Cousin     mother's maternal first cousin  . Diabetes Maternal Aunt   . Cirrhosis Brother      GENETIC  COUNSELING/TESTING: Yes, performed 03/18/2015: Variant of unknown significance at POLD1 gene called c.327G>C. neg at APC, ATM, AXIN2, BARD1, BMPR1A, BRCA1, BRCA2, BRIP1, CDH1, CDK4, CDKN2A, CHEK2, EPCAM, FANCC, MLH1, MSH2, MSH6, MUTYH, NBN, PALB2, PMS2, POLD1, POLE, PTEN, RAD51C, RAD51D, SCG5/GREM1, SMAD4, STK11, TP53, VHL,XRCC2   SOCIAL HISTORY:  NANCEY KREITZ is divorced and lives alone in [place], [place].  She has 2 children. Ms. Gutterman is currently not working.  She is a former smoker and denies any current or history of alcohol or illicit drug use.     PHYSICAL EXAMINATION:  Vital Signs: Filed Vitals:   11/12/15 0954  BP: 131/81  Pulse: 88  Temp: 98.2 F (36.8 C)  Resp: 20   ECOG Performance Status: 1  General: Well-nourished, well-appearing female in no acute distress.  HEENT: Head is atraumatic and normocephalic.  Pupils equal and reactive to light and accomodation. Conjunctivae clear without exudate.  Sclerae anicteric. Oral mucosa is pink, moist, and intact without lesions.  Oropharynx is pink without lesions or erythema.  Lymph: No cervical, supraclavicular, infraclavicular, or axillary lymphadenopathy noted on palpation.  Cardiovascular: Regular rate and rhythm without murmurs, rubs, or gallops. Respiratory: Clear to auscultation bilaterally. Chest expansion symmetric without accessory muscle use on inspiration or expiration.  GI: Abdomen soft and round. No tenderness to palpation. Bowel sounds normoactive in 4 quadrants. GU: Deferred.   Neuro: No focal deficits. Steady gait.  Psych: Mood and affect normal and appropriate for situation.  Extremities: No edema, cyanosis, or clubbing.  Skin: Warm and dry. No open lesions noted.   LABORATORY DATA:  None for this visit.  DIAGNOSTIC IMAGING:  None for this visit.     ASSESSMENT AND PLAN:   1. 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  Ms. Mondor is doing well without clinical symptoms worrisome for disease recurrence. She will follow-up with her medical oncologist,  Dr. Burr Medico, in June 2017 with history and physical examination per surveillance protocol.  She will continue her anti-estrogen therapy with letrozole at this time and report any new or increased side effects  of the medication A comprehensive survivorship care plan and treatment summary was reviewed with the patient today detailing her breast cancer diagnosis, treatment course, potential late/long-term effects of treatment, appropriate follow-up care with recommendations for the future, and patient education resources.  A copy of this summary, along with a letter will be sent to the patient's primary care provider via in basket message after today's visit.  Ms. Brassfield is welcome to return to the Survivorship Clinic in the future, as needed; no follow-up will be scheduled at this time.    2. Hot flashes: Ms. Haren and I have discussed the nature and etiology of her hot flashes.  As she has been on her letrozole for almost 4 weeks, I am hopeful that they will improve slightly over the next few weeks.  If not, we have discussed various pharmacologic interventions as well as environmental and dietary factors for her to try to help reduce them.  She will try the environmental and dietary modifications as she awaits to see if they improve in the next 2 weeks.  If they worsen before her appointment with Dr. Burr Medico, she will notify us.  3. Bone health:  Given Ms. Nesser's age/history of breast cancer and her current treatment regimen including endocrine therapy with  letrozole, she is at risk for bone demineralization. She reports that she has had a bone density test in the past and we will attempt to locate these results.  We will continue to monitor this closely while she is on endocrine therapy.  In the meantime, she was encouraged to increase her consumption of foods rich in calcium and vitamin D as well as to increase her weight-bearing activities. She was given education on specific activities to promote bone health.  4. Cancer screening:  Due to Ms. Kaestner's history and her age, she should receive screening for skin cancers, colon cancer, and gynecologic cancers.  The information and recommendations are listed on the patient's comprehensive care plan/treatment summary and were reviewed in detail with the patient.    5. Health maintenance and wellness promotion: Ms. Pore was encouraged to consume 5-7 servings of fruits and vegetables per day. We reviewed the "Nutrition Rainbow" handout, as well as discussed recommendations to maximize nutrition and minimize recurrence, such as increased intake of fruits, vegetables, lean proteins, and minimizing the intake of red meats and processed foods.  She was also encouraged to engage in moderate to vigorous exercise for 30 minutes per day most days of the week. We discussed the [employer] fitness program, which is designed for cancer survivors to help them become more physically fit after cancer treatments.  She was instructed to limit her alcohol consumption and continue to abstain from tobacco use.  A copy of the "Take Control of Your Health" brochure was given to her reinforcing these recommendations.   6. Support services/counseling: It is not uncommon for this period of the patient's cancer care trajectory to be one of many emotions and stressors.  We discussed an opportunity for her to participate in the next session of [facility] ("Finding Your New Normal") support group series designed for patients after they have  completed treatment.  Ms. Gauger was encouraged to take advantage of our many other support services programs, support groups, and/or counseling in coping with her new life as a cancer survivor after completing anti-cancer treatment. She was offered support today through active listening and expressive supportive counseling.  She was given information regarding our available services and encouraged to contact me  with any questions or for help enrolling in any of our support group/programs.    A total of 60 minutes of face-to-face time was spent with this patient with greater than 50% of that time in counseling and care-coordination.   Sylvan Cheese, NP  Survivorship Program [facility] [address] [phone]   Note: PRIMARY CARE PROVIDER [place], [place] [id number] [phone]

## 2015-12-07 ENCOUNTER — Other Ambulatory Visit: Payer: Self-pay | Admitting: *Deleted

## 2015-12-07 DIAGNOSIS — C50211 Malignant neoplasm of upper-inner quadrant of right female breast: Secondary | ICD-10-CM

## 2015-12-07 MED ORDER — LETROZOLE 2.5 MG PO TABS: 2.5000 mg | ORAL_TABLET | Freq: Every day | ORAL | Status: DC

## 2015-12-09 IMAGING — CT CT HEAD W/O CM
2 series · 16 of 30 positions shown, 19 images · non-contrast
Comparison: MRI brain 03/13/2015 and CT 03/22/2011

CLINICAL DATA: Back pain and lightheaded. Syncopal episode 1-2
hours ago. Chemotherapy 5 days ago for breast cancer. Also history
of colon cancer. Brain cancer. IV contrast 3 hours ago for CTA
chest.

EXAM:
CT HEAD WITHOUT CONTRAST
TECHNIQUE: Contiguous axial images were obtained from the base of the skull
through the vertex without intravenous contrast.

[Series 2: head w/o · axial · non-contrast · 0.42mm/px · z∈[+1354,+1479]mm · 9 of 33 slices shown, 12 images]
[im 4/33  brain]
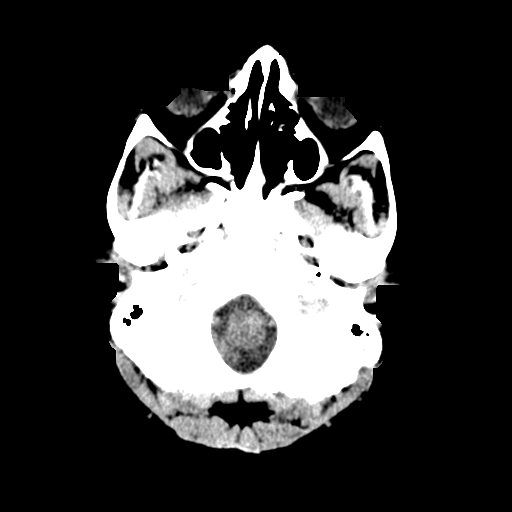
[im 4/33  bone]
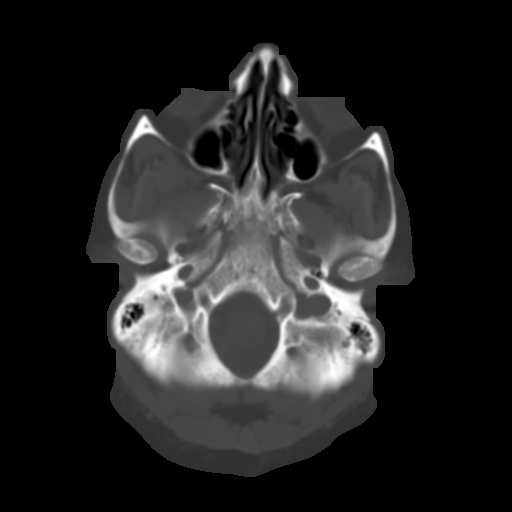
[im 7/33  brain]
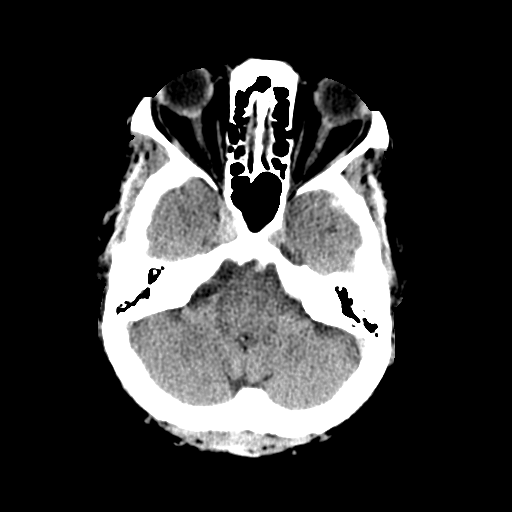
[im 10/33  brain]
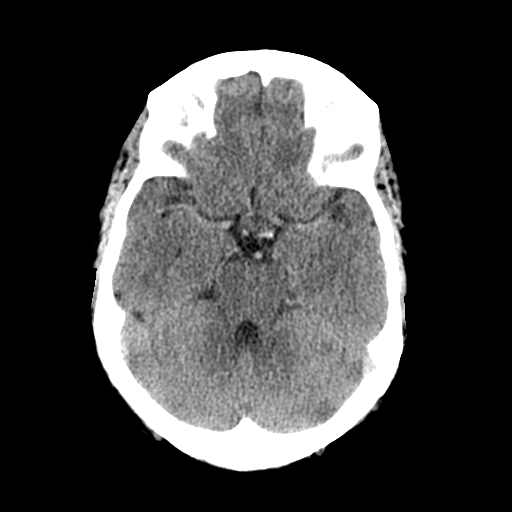
[im 13/33  brain]
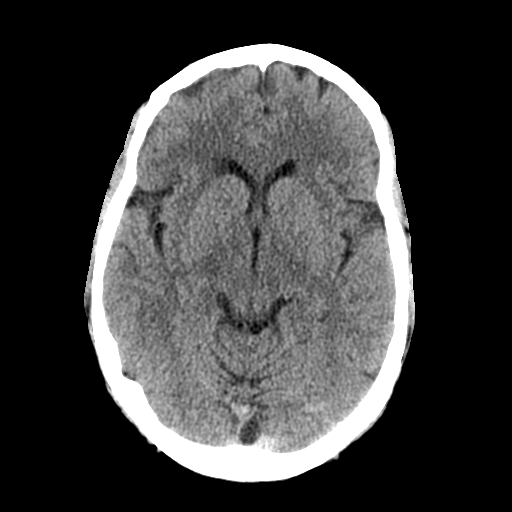
[im 17/33  brain]
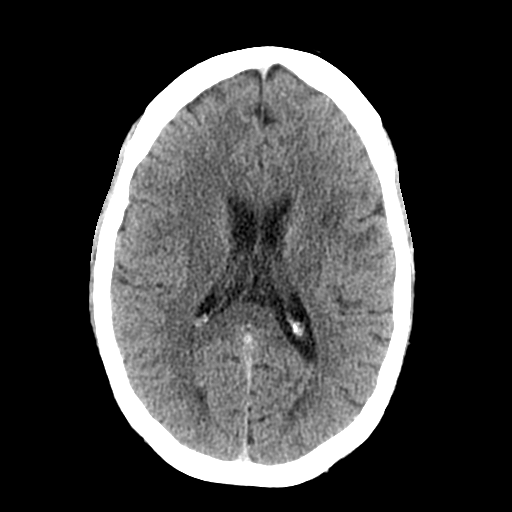
[im 17/33  bone]
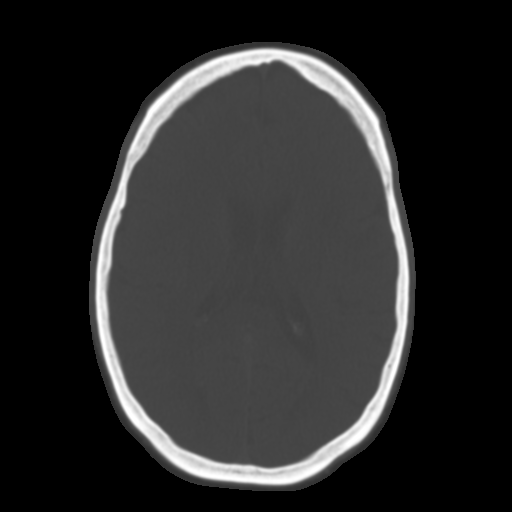
[im 20/33  brain]
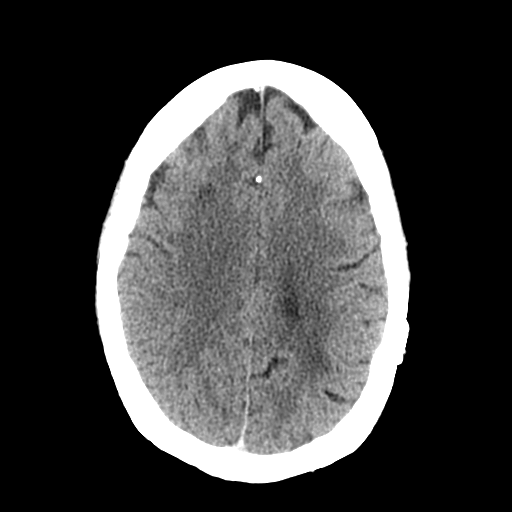
[im 23/33  brain]
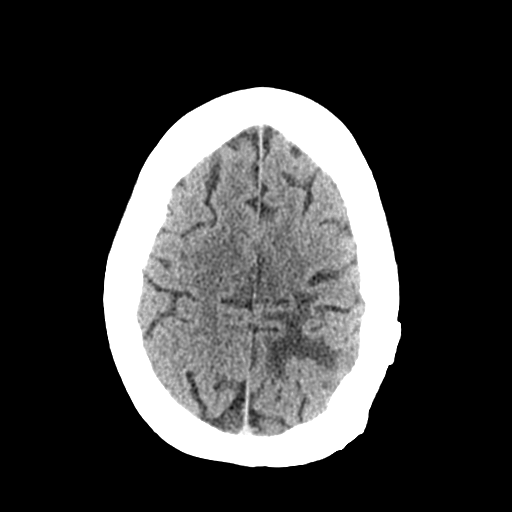
[im 26/33  brain]
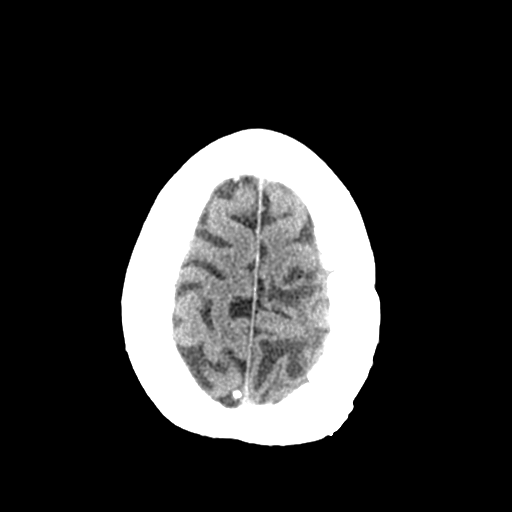
[im 29/33  brain]
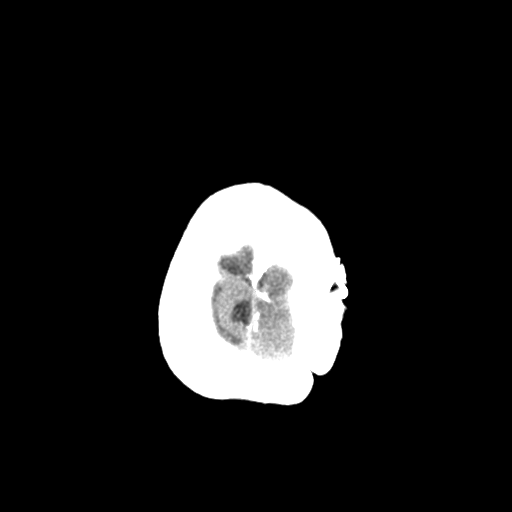
[im 29/33  bone]
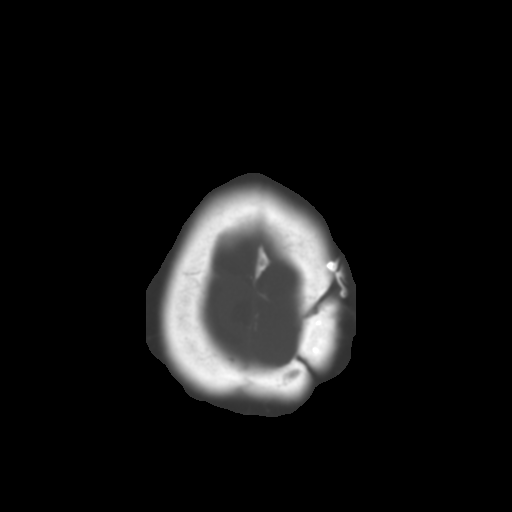

[Series 3: bone windows · axial · 0.42mm/px · z∈[+1357,+1465]mm · 7 of 55 slices shown]
[im 7/55  bone]
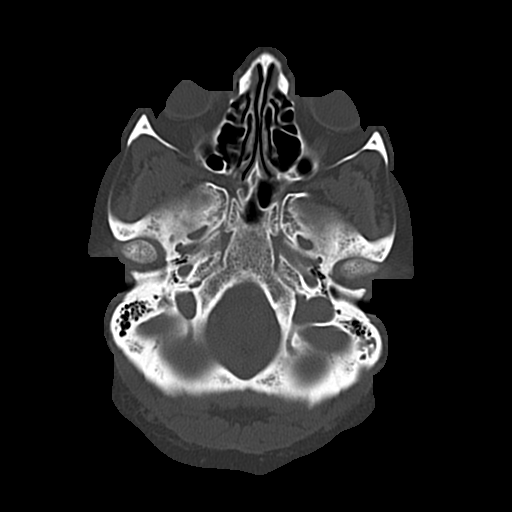
[im 13/55  bone]
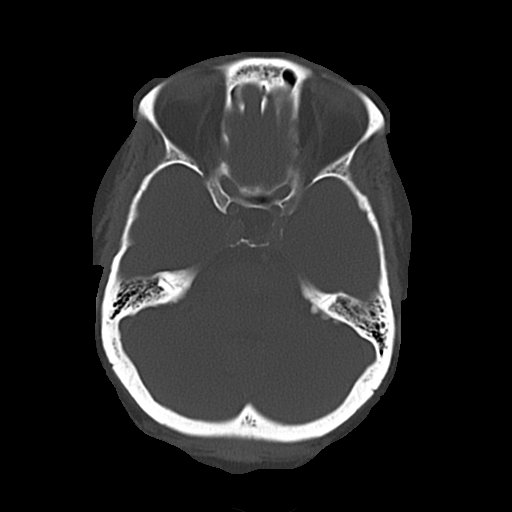
[im 19/55  bone]
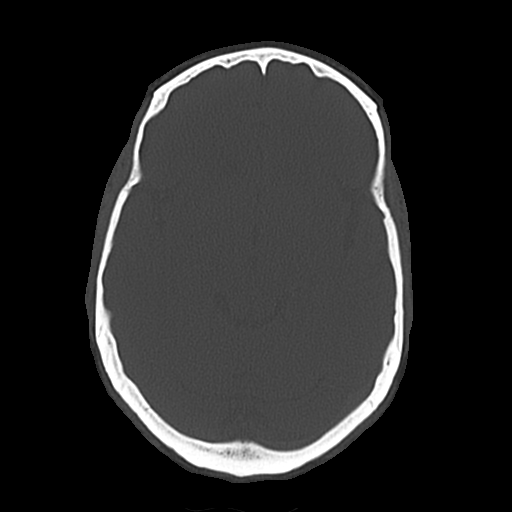
[im 25/55  bone]
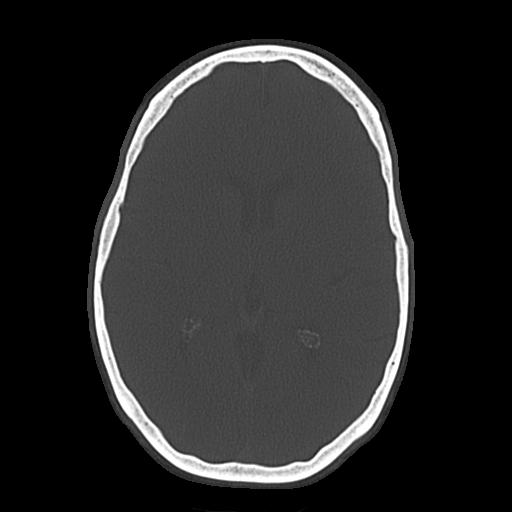
[im 31/55  bone]
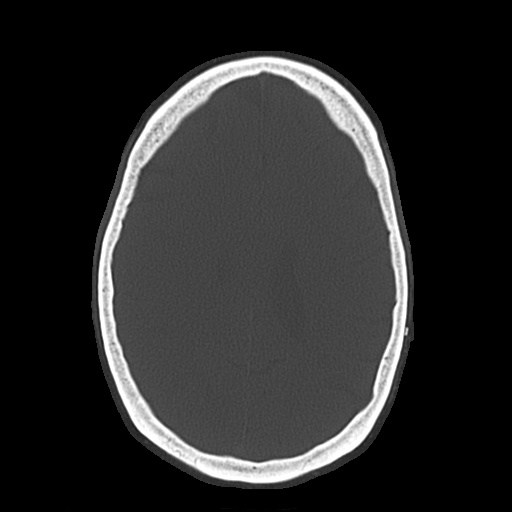
[im 37/55  bone]
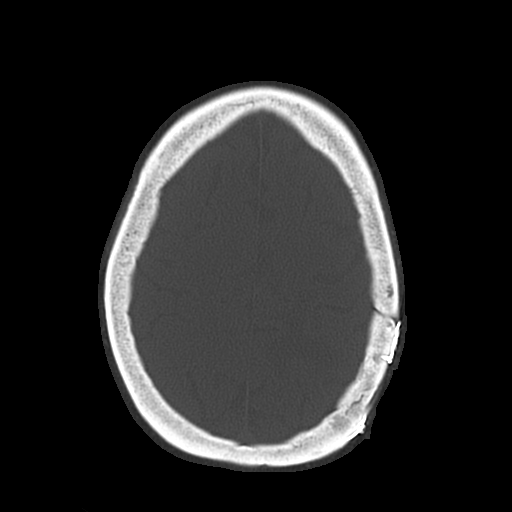
[im 43/55  bone]
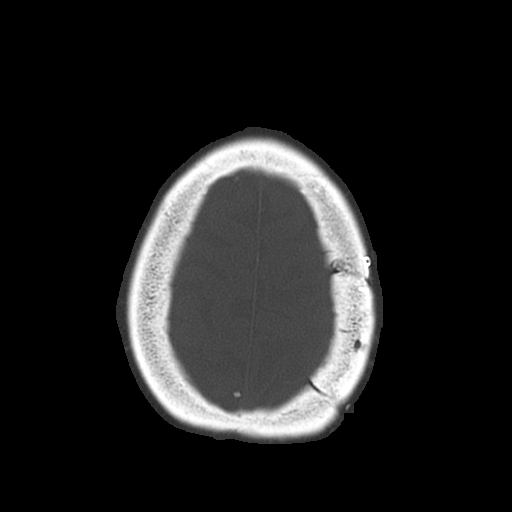

[16 of 30 positions shown; findings below may reference images not displayed]

FINDINGS: Ventricles, cisterns and other CSF spaces are within normal.
Previous left parietal craniotomy. Postsurgical change/
encephalomalacia over the high left parietal region. Subtle chronic
ischemic microvascular disease. No evidence of focal mass, mass
effect, shift of midline structures or acute hemorrhage. No evidence
of acute infarction. Remainder the exam is within normal.
IMPRESSION: No acute intracranial findings.

Previous left parietal craniotomy and stable post treatment changes
of the left parietal region.

Minimal chronic ischemic microvascular disease.

## 2015-12-11 ENCOUNTER — Encounter (HOSPITAL_COMMUNITY): Payer: Self-pay

## 2015-12-16 ENCOUNTER — Encounter: Payer: Self-pay | Admitting: Adult Health

## 2015-12-16 NOTE — Progress Notes (Signed)
A birthday card was mailed to the patient today on behalf of the Survivorship [facility] at [facility].   Gretchen Dawson, NP [facility] [phone]  

## 2015-12-23 ENCOUNTER — Ambulatory Visit (HOSPITAL_BASED_OUTPATIENT_CLINIC_OR_DEPARTMENT_OTHER): Payer: Medicare HMO | Admitting: Hematology

## 2015-12-23 ENCOUNTER — Telehealth: Payer: Self-pay | Admitting: Hematology

## 2015-12-23 ENCOUNTER — Other Ambulatory Visit (HOSPITAL_BASED_OUTPATIENT_CLINIC_OR_DEPARTMENT_OTHER): Payer: Medicare HMO

## 2015-12-23 VITALS — BP 133/82 | HR 74 | Temp 98.4°F | Resp 18 | Ht 64.0 in | Wt 182.2 lb

## 2015-12-23 DIAGNOSIS — Z85038 Personal history of other malignant neoplasm of large intestine: Secondary | ICD-10-CM | POA: Diagnosis not present

## 2015-12-23 DIAGNOSIS — G620 Drug-induced polyneuropathy: Secondary | ICD-10-CM

## 2015-12-23 DIAGNOSIS — Z79811 Long term (current) use of aromatase inhibitors: Secondary | ICD-10-CM | POA: Diagnosis not present

## 2015-12-23 DIAGNOSIS — Z170 Estrogen receptor positive status [ER+]: Secondary | ICD-10-CM | POA: Diagnosis not present

## 2015-12-23 DIAGNOSIS — C186 Malignant neoplasm of descending colon: Secondary | ICD-10-CM

## 2015-12-23 DIAGNOSIS — C189 Malignant neoplasm of colon, unspecified: Secondary | ICD-10-CM | POA: Diagnosis not present

## 2015-12-23 DIAGNOSIS — C7931 Secondary malignant neoplasm of brain: Secondary | ICD-10-CM

## 2015-12-23 DIAGNOSIS — C50211 Malignant neoplasm of upper-inner quadrant of right female breast: Secondary | ICD-10-CM

## 2015-12-23 DIAGNOSIS — I10 Essential (primary) hypertension: Secondary | ICD-10-CM

## 2015-12-23 LAB — COMPREHENSIVE METABOLIC PANEL
ALT: 24 U/L (ref 0–55)
ANION GAP: 7 meq/L (ref 3–11)
AST: 22 U/L (ref 5–34)
Albumin: 3.7 g/dL (ref 3.5–5.0)
Alkaline Phosphatase: 95 U/L (ref 40–150)
BUN: 14.7 mg/dL (ref 7.0–26.0)
CHLORIDE: 107 meq/L (ref 98–109)
CO2: 27 meq/L (ref 22–29)
Calcium: 9.8 mg/dL (ref 8.4–10.4)
Creatinine: 0.9 mg/dL (ref 0.6–1.1)
EGFR: 82 mL/min/{1.73_m2} — AB (ref >90)
Glucose: 96 mg/dl (ref 70–140)
Potassium: 4.1 mEq/L (ref 3.5–5.1)
Sodium: 142 mEq/L (ref 136–145)
Total Bilirubin: 1.04 mg/dL (ref 0.20–1.20)
Total Protein: 7.3 g/dL (ref 6.4–8.3)

## 2015-12-23 LAB — CBC WITH DIFFERENTIAL/PLATELET
BASO%: 0.3 % (ref 0.0–2.0)
BASOS ABS: 0 10*3/uL (ref 0.0–0.1)
EOS ABS: 0.1 10*3/uL (ref 0.0–0.5)
EOS%: 2 % (ref 0.0–7.0)
HCT: 42 % (ref 34.8–46.6)
HGB: 14.1 g/dL (ref 11.6–15.9)
LYMPH%: 22.4 % (ref 14.0–49.7)
MCH: 27.4 pg (ref 25.1–34.0)
MCHC: 33.6 g/dL (ref 31.5–36.0)
MCV: 81.6 fL (ref 79.5–101.0)
MONO#: 0.6 10*3/uL (ref 0.1–0.9)
MONO%: 8.1 % (ref 0.0–14.0)
NEUT#: 4.7 10*3/uL (ref 1.5–6.5)
NEUT%: 67.2 % (ref 38.4–76.8)
PLATELETS: 211 10*3/uL (ref 145–400)
RBC: 5.15 10*6/uL (ref 3.70–5.45)
RDW: 14.8 % — ABNORMAL HIGH (ref 11.2–14.5)
WBC: 7 10*3/uL (ref 3.9–10.3)
lymph#: 1.6 10*3/uL (ref 0.9–3.3)

## 2015-12-23 NOTE — Progress Notes (Signed)
Hematology and Oncology Follow Up Visit Date of Visit: 12/23/2015   Kayla [name] [id number] 1951-06-11 65 y.o. 12/23/2015   PCP: [place] [id number])  Principle Diagnosis:  1. Colon cancer, T4N1M1, Stage IV, diagnosed on 10/27/2008, brain recurrence in 03/2011  2. Stage IIA right breast cancer, diagnosed in 02/2015  Oncology History   Breast cancer of upper-inner quadrant of right female breast [facility])   Staging form: Breast, AJCC 7th Edition     Clinical: Stage IIA (T2, N0, M0) - Unsigned     Pathologic stage from 04/14/2015: Stage IIA (T2, N0, cM0) - Signed by Truitt Merle, MD on 05/05/2015 Cancer of left colon [facility])   Staging form: Colon and Rectum, AJCC 7th Edition     Pathologic: T4a, N1a, M1 - Unsigned        Breast cancer of upper-inner quadrant of right female breast ([place])   02/27/2015 Mammogram Diagnostic mammogram showed a 2.5 cm irregular mass within the far posterior upper inner right breast, ultrasound confirmed a 1.9 x 1.0 x 0.8 cm mass at 1:30 o'clock 15 submitted from nipple. No axillary adenopathy   03/03/2015 Initial Biopsy Right breast needle biopsy showed invasive ductal carcinoma, grade 1-2.   03/03/2015 Receptors her2 ER 100%+, PR 80%+, ki67 15%, HER2/neu negative   03/03/2015 Clinical Stage Stage IIA: T2 N0   03/18/2015 Procedure VUS at POLD1 gene called c.327G>C. neg at APC, ATM, AXIN2, BARD1, BMPR1A, BRCA1, BRCA2, BRIP1, CDH1, CDK4, CDKN2A, CHEK2, EPCAM, FANCC, MLH1, MSH2, MSH6, MUTYH, NBN, PALB2, PMS2, POLD1, POLE, PTEN, RAD51C, RAD51D, SCG5/GREM1, SMAD4, STK11, TP53, VHL,XRCC2   04/14/2015 Surgery Right breast lumpectomy and sentinel lymph node biopsy. Surgical margins were negative.   04/14/2015 Pathology Results right breast invasive ductal carcinoma, grade 2, 2.7 cm,(+) DCIS, margins were negative, 4 sentinel lymph nodes and one axillary lymph nodes were negative, (+)lymphovascular invasion.   04/14/2015 Pathologic Stage Stage IIA: T2 N0   04/14/2015 Oncotype testing RS 30, which predicts 10-year risk of distance recurrence with tamoxifen alone 20%, intermediate risk   05/21/2015 - 07/24/2015 Adjuvant Chemotherapy  Adjuvant chemotherapy with docetaxel 75 mg/m, and Cytoxan 600 mg/m X 4 cycles   05/26/2015 - 05/29/2015 Hospital Admission Pt was admitted for UTI and syncope, treated with antibiotics and IVF    08/17/2015 - 09/13/2015 Radiation Therapy Adjuvant RT: Right breast treated to 42.5 Gy in 17 fractions, Right breast boost treated to 7.5 Gy in 3 fractions   10/14/2015 -  Anti-estrogen oral therapy Letrozole 2.5 mg daily   11/13/2015 Survivorship Survivorship visit completed   Oncology History: Diagnosis of colon cancer dates back to 10/27/2008 when Kayla [name] presented with what turned out to be a bowel perforation through tumor. Stage at that time was T4 N1 M1 with pulmonary metastatic disease. The K-ras mutation was detected. Kayla [name] underwent surgical resection of her tumor with a colostomy at the time of her surgery on 10/27/2008. The colostomy has been subsequently reversed on 09/20/2010. Kayla [name] received chemotherapy consisting of FOLFOX for 10 treatments from 12/10/2008 through 06/02/2009. She achieved a partial remission from these treatments. She then received additional chemotherapy with 5-FU, leucovorin, and 5-FU by continuous infusion along with Avastin from 06/23/2009 through 11/17/2009. 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 She was doing well without evidence of disease until she developed a right hemiparesthesia in September 2012 and was found to have a 3 x 3 x 2.4 cm, lobulated, enhancing mass in the left parietal lobe with marked surrounding edema. A  PET scan on 03/25/2011 showed resolution of the previously identified pulmonary nodules with no residual hypermetabolic activity. The pericardial effusion, which has been present since diagnosis, was unchanged. There were also uterine fibroids and a  low-density lesion in the anterior spleen that was stable and not associated with any hypermetabolic activity. Of note, Kayla [name] had a markedly elevated CEA up to 24.7 on 03/23/2011. On 06/23/2011, the CEA was less than 0.5. Dr. Erline Levine resected the recurrent metastatic colon cancer on 03/31/2011. This was followed by stereotactic radiation on 04/15/2011. The patient underwent a left-sided craniotomy and excision of the mass involving her left parietal lobe on 11/10/2011 by Dr. Erline Levine. The pathology report was negative for any malignancy. Pathology report indicated benign brain with fibrosis, hemorrhage, hemosiderin deposition, abundant dystrophic calcifications, and mixed acute and chronic inflammation including foreign body multinucleated giant cells.    Prior Therapy:   1. Kayla [name] underwent surgical resection of her tumor with a colostomy at the time of her surgery on 10/27/2008. The colostomy has been subsequently reversed on 09/20/2010. Kayla [name] received chemotherapy consisting of FOLFOX for 10 treatments from 12/10/2008 through 06/02/2009. She achieved a partial remission from these treatments. She then received additional chemotherapy with 5-FU, leucovorin, and 5-FU by continuous infusion along with Avastin from 06/23/2009 through 11/17/2009. 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  2. See above for her breast cancer treatment history   Current therapy:  adjuvant letrozole 2.60m daily started on 10/17/2015  Interim History:  DJenavevereturns for follow-up. She had been on letrozole for 2 months now, tolerating well, moderate hot flush, manageble, no other new complaints.She is doing well overall, denies any significant pain, dyspnea or abdominal discomfort. She has mild occasional pain at her right breast surgical site.    Past Medical History  Diagnosis Date  . Pericardial effusion     echo 08/13/09  . LVH (left ventricular hypertrophy)     mod/severe. echo  1/11. EF 65-70%   . Hypertension   . [place]ocyt[place]   . 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 Allergy     sulfa  . Colon cancer ([place]     colon/ 2010/surg/chemo  . History of radiation therapy 04/15/11    17 Gy single fraction  l parietal brain metastais  . Heart murmur   . Peripheral vascular disease ([place]   . Shortness of breath   . Blood transfusion   . GERD (gastroesophageal reflux disease)   . Headache(784.0)   . Neuromuscular disorder ([place]     peripheral neuropathy feet  . Arthritis   . Diverticulosis 07/22/2010    sigmoid colon  . Family history of breast cancer   . Family history of colon cancer   . S/P radiation therapy 08/18/15-09/14/15    right breast   GYN HISTORY  Menarchal: 14 LMP: 54 Contraceptive: a few years  HRT: no  G2P2:    Medications: I have reviewed the patient's current medications.  Current Outpatient Prescriptions  Medication Sig Dispense Refill  . acetaminophen (TYLENOL) 500 MG tablet Take 1,000 mg by mouth every 6 (six) hours as needed for mild pain, moderate pain or headache. Reported on 09/30/2015    . fluticasone (FLONASE) 50 MCG/ACT nasal spray Place 1 spray into both nostrils daily as needed for allergies. Reported on 11/12/2015    . gabapentin (NEURONTIN) 300 MG capsule TAKE ONE CAPSULE BY MOUTH AT BEDTIME (Patient taking differently: TAKE 300 MG  BY MOUTH AT BEDTIME) 90 capsule 0  . letrozole (FEMARA) 2.5 MG tablet Take 1 tablet (2.5 mg total) by mouth daily. 90 tablet 1  . lisinopril (PRINIVIL,ZESTRIL) 40 MG tablet Take 1 tablet (40 mg total) by mouth daily. 90 tablet 0  . non-metallic deodorant (ALRA) MISC Apply 1 application topically daily as needed. Reported on 11/12/2015    . oxyCODONE (OXY IR/ROXICODONE) 5 MG immediate release tablet Take 1-2 tablets (5-10 mg total) by mouth every 4 (four) hours as needed for moderate pain, severe pain or breakthrough pain. (Patient not taking: Reported on 09/11/2015) 40  tablet 0   No current facility-administered medications for this visit.   Allergies:  Allergies  Allergen Reactions  . Sulfonamide Derivatives Rash    Past Medical History, Surgical history, Social history, and Family History were reviewed and updated.  SMOKING HISTORY:  The patient has smoked intermittently, about 2 packs of cigarettes per week, since she was a teenager, but stopped smoking in 2010.  Review of Systems: Constitutional:  Negative for fever, chills, night sweats, anorexia, weight loss, pain. Cardiovascular: no chest pain or dyspnea on exertion Respiratory: no cough, shortness of breath, or wheezing Neurological: no TIA or stroke symptoms Dermatological: negative for rash ENT: negative for - epistaxis, headaches, tinnitus, vertigo or visual changes Skin: Negative. Gastrointestinal: no abdominal pain, change in bowel habits, or black or bloody stools positive for - heartburn Genito-Urinary: no dysuria, trouble voiding, or hematuria Hematological and Lymphatic: negative for - bleeding problems, fatigue, jaundice or pallor Breast: negative for breast lumps Musculoskeletal: positive for - muscular weakness and pins and needles with some numbness (R>L) in lower extremities negative for - gait disturbance Remaining ROS negative.  Physical Exam: Blood pressure 133/82, pulse 74, temperature 98.4 F (36.9 C), temperature source Oral, resp. rate 18, height 5' 4"  (1.626 m), weight 182 lb 3.2 oz (82.645 kg), SpO2 98 %. ECOG: 0 General appearance: alert, cooperative, appears stated age, no distress and mildly obese Head: Normocephalic, without obvious abnormality, atraumatic Neck: no adenopathy, supple, symmetrical, trachea midline and thyroid not enlarged, symmetric, no tenderness/mass/nodules HEENT: PERRLA; EOMi; No sclerae icterus. OP clear of masses.  Lymph nodes: Cervical, supraclavicular, and axillary nodes normal. Heart:regular rate and rhythm, S1, S2 normal, no  murmur, click, rub or gallop Lung:chest clear, no wheezing, rales, normal symmetric air entry,  + R sided port-a-cath. Abdomin: soft, non-tender, without masses or organomegaly, normal bowel sounds and surgical scars well healed. EXT:No peripheral edema.  R>L decreased sensation and mildly decreased strength (noted in prior exams) Neuro: R lower extremity weakness and decreased sensation.  Normal gait.  Good finger to nose bilaterally. Otherwise no focal deficits. Breasts: Breast inspection showed them to be symmetrical with no nipple discharge. The surgical incision in right breast and axilla are well-healed, no tenderness. (+) Diffuse skin pigmentation of the right breast.  Palpation of the breasts and axilla revealed no obvious mass that I could appreciate.    Lab Results: CBC Latest Ref Rng 12/23/2015 09/30/2015 08/03/2015  WBC 3.9 - 10.3 10e3/uL 7.0 6.2 1.6(L)  Hemoglobin 11.6 - 15.9 g/dL 14.1 13.7 12.5  Hematocrit 34.8 - 46.6 % 42.0 42.8 38.9  Platelets 145 - 400 10e3/uL 211 218 213    CMP Latest Ref Rng 12/23/2015 09/30/2015 07/24/2015  Glucose 70 - 140 mg/dl 96 102 130  BUN 7.0 - 26.0 mg/dL 14.7 9.3 15.1  Creatinine 0.6 - 1.1 mg/dL 0.9 0.8 0.8  Sodium 136 - 145 mEq/L 142 141 143  Potassium  3.5 - 5.1 mEq/L 4.1 4.1 3.9  CO2 22 - 29 mEq/L 27 26 22   Calcium 8.4 - 10.4 mg/dL 9.8 9.5 10.2  Total Protein 6.4 - 8.3 g/dL 7.3 7.1 7.3  Total Bilirubin 0.20 - 1.20 mg/dL 1.04 0.87 0.52  Alkaline Phos 40 - 150 U/L 95 100 104  AST 5 - 34 U/L 22 35(H) 21  ALT 0 - 55 U/L 24 37 32   CEA  Status: Finalresult Visible to patient:  Not Released Nextappt: 08/11/2014 at 09:40 AM in Radiology (AP-MM 1) Dx:  Metastatic cancer to brain           Ref Range 87moago  672mogo  36m57moo  33yr59yr     CEA 0.0 - 5.0 ng/mL 0.9 1.3 1.4 1.1       PATHOLOGY REPORT Diagnosis 04/14/2015 1. Breast, lumpectomy, right - INVASIVE GRADE II DUCTAL CARCINOMA, SPANNING 2.7 CM IN GREATEST  DIMENSION. - ASSOCIATED INTERMEDIATE GRADE DUCTAL CARCINOMA IN SITU. - LYMPH/VASCULAR INVASION IS IDENTIFIED. - MARGINS ARE NEGATIVE. - SEE ONCOLOGY TEMPLATE. 2. Lymph node, sentinel, biopsy, right axillary - ONE BENIGN LYMPH NODE WITH NO TUMOR SEEN (0/1). 3. Lymph node, sentinel, biopsy, right axillary - ONE BENIGN LYMPH NODE WITH NO TUMOR SEEN (0/1). 4. Lymph node, sentinel, biopsy, right axillary - ONE BENIGN LYMPH NODE WITH NO TUMOR SEEN (0/1). 5. Lymph node, biopsy, right axillary - ONE BENIGN LYMPH NODE WITH NO TUMOR SEEN (0/1). Microscopic Comment 1. BREAST, INVASIVE TUMOR, WITH LYMPH NODES PRESENT Specimen, including laterality and lymph node sampling (sentinel, non-sentinel): Right partial breast with right sentinel lymph node sampling. Procedure: Right breast lumpectomy with right sentinel lymph node biopsies. Histologic type: Invasive ductal carcinoma. Grade: 2. Tubule formation: 3. Nuclear pleomorphism: 2. Mitotic: 1. Tumor size (gross measurement): 2.7 cm. Margins: Invasive, distance to closest margin: 0.2 cm (anterior margin). In-situ, distance to closest margin: At least 0.4 cm (all margins). Lymphovascular invasion: Yes, lymph/vascular invasion is identified. Ductal carcinoma in situ: Yes. Grade: Intermediate grade. Extensive intraductal component: No. Lobular neoplasia: Not identified. Tumor focality: Unifocal. Treatment effect: Not applicable. Extent of tumor: Tumor confined to breast parenchyma. Skin: Not received. Nipple: Not received. Skeletal muscle: Not received. Lymph nodes Examined: 3 Sentinel. 1 Non-sentinel. 4 Total. Lymph nodes with metastasis: 0. Isolated tumor cells (< 0.2 mm): 0. Micrometastasis: (> 0.2 mm and < 2.0 mm): 0. Macrometastasis: (> 2.0 mm): 0. Extracapsular extension: Not applicable. Breast prognostic profile: Performed on previous case [id number] receptor: 100%, positive. Progesterone receptor: 80%,  positive. Her-2 neu: 1.30 ratio, negative. Ki-67: 15%. Non-neoplastic breast: Unremarkable. TNM: pT2, pN0. Comments: As Her-2 neu was previously negative, this will be repeated on representative tumor from the current specimen, and will be reported in an addendum to follow. (RH:ds 04/15/15)  Results: HER2 - NEGATIVE RATIO OF HER2/CEP17 SIGNALS 1.17 AVERAGE HER2 COPY NUMBER PER CELL 2.05   Radiological Studies: 1. MRI of the head with and without IV contrast on 03/23/2011 showed a solitary enhancing mass in the left parietal lobe with surrounding white matter edema, most compatible with solitary metastatic deposit. The mass lesion measured 2.7 x 2.5 cm.  2. PET scan from 03/25/2011 showed resolution of the previously- identified pulmonary nodules with no residual hypermetabolic activity noted in the neck, chest, abdomen or pelvis to suggest active malignancy. There was a pericardial effusion and some subsegmental atelectasis in the lower lobes. There were also uterine fibroids and a low-density lesion in the anterior spleen that was stable and  not associated with hypermetabolic activity.  3. MRI of the head with and without IV contrast showed Stealth protocol utilized to evaluate left parietal mass. The mass measured 3 x 3 x 2.4 cm with marked surrounding vasogenic edema.  4. MRI of the head with and without IV contrast on 04/08/2011 showed postsurgical changes of tumor removal in the left parietal lobe and a postsurgical hematoma with enhancement. There was 3 mm of midline shift. No other metastatic deposits.  5. MRI of the head with and without IV contrast on 06/28/2011 showed a 3 cm area of restricted diffusion lying within the previous area of  left parietal surgical resection for colon cancer. There was enhancement of the peripheral aspect of the cavity, marked restricted diffusion and increasing vasogenic edema. There was concern about the development of a brain abscess.  6. MRI of the head  with and without IV contrast on 07/29/2011 showed left parietal postoperative hematoma was smaller compared with the  prior study. There was no nodular enhancement seen to indicate tumor. No new enhancing lesions were seen. There was significant  improvement in the left parietal edema.  7. Digital screening mammogram was negative on 08/02/2011.  8. An MRI of the head with and without IV contrast on 10/21/2011 showed development of gyriform enhancement circumferentially along the margins of the left parietal postoperative space/hematoma. Marked increase in vasogenic edema was seen. 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 There was further contraction of the hematoma/postoperative space in the left parietal cortical region, now measuring 17 x 19 mm in transverse diameter as opposed to 22 x 21 mm previously.  Left-to-right shift was 2 mm.  9. Nuclear medicine brain PET-CT scan carried out on 10/27/2011 showed hypermetabolic activity in the high left parietal lobe which corresponds to gyriform enhancement on comparison MRI from 10/21/2011. This was felt to be most consistent with recurrent  tumor.  10. Chest x-ray, 2 view from 11/04/2011, showed chronic cardiomegaly. Port-A-Cath was in place. No acute disease was present.  11. MRI of the head with and without IV contrast on 01/27/2012 showed findings that were felt to be consistent with progression of disease. This exam was compared with the MRI of 10/21/2011. It was recognized that the patient underwent surgical resection of the mass on 11/10/2011.  There was felt to be on the present exam residual peripheral enhancement of approximately 35 x 40 x 21 mm with infiltration of the cortex and deep white matter. It was felt that the enhancement had progressed, despite removal of the central focus. There was moderate vasogenic  edema throughout the left hemisphere but slightly decreased from the MRI from  10/21/2011. No new lesions were seen. There was mild atrophy with chronic microvascular ischemic change, but no midline shift. Major intracranial vascular structures were patent.  12. MRI of the brain with and without IV contrast on 05/04/2012 showed previous left parietal lobe surgery for resection of tumor and radiation necrosis.  No new abnormalities were noted. 13. Chest x-ray, 2 view, on 07/03/2012 showed enlargement of the cardiac silhouette with some lingular scarring, as compared with the chest x-ray from 11/04/2011. 14. 2-D echocardiogram from 07/12/2012 showed left ventricular ejection fraction of 55-60%.  There was a small to moderate circumferential pericardial effusion with no signs of tamponade.  There was felt to be no significant change from the 2-D echocardiogram dated 08/13/2009. 15. Digital bilateral screening mammogram on 08/06/2012 was negative. 16. MRI of the  head with and without IV contrast on 08/10/2012 showed stable to mildly regressed findings at the left parietal surgical site since 01/27/2012 following resection of radionecrosis.  There was no progression or new brain metastasis identified. 17. PET scan from 10/16/2012 showed no specific features to suggest metastatic disease.  There was diffuse uptake throughout the thyroid gland, possibly due to hyperthyroidism. 18. MRI of the brain with and without IV contrast on 03/12/2013 showed residual gyriform enhancement surrounding the area of previous surgical resection of a left parietal metastasis with subsequent resection of radionecrosis. The findings likely represent some residual brain injury, but there is no significant progression of radionecrosis and no new lesions seen. 19. MRI of the brain with and without IV contrast on 11/01/2013 showed Stable posttherapy appearance of the brain since May of 2014. No progression or new brain metastasis identified. 20. CT chest,abomen and pelvis with contrast. 1. No evidence of thoracic  metastasis.  2. Small prevascular lymph node is similar prior. 3. Stable pericardial effusion. 4. Interval increase in the volume presacral lymph node in the upper pelvis. This has progressed slowly from 2011. Recommend attention on follow-up versus restaging FDG PET scan. 5. Stable anastomosis in the descending colon. 21. CT chest, abdomen and pelvis with contrast 07/126/2015. 1. No evidence of local colon cancer recurrence or metastasis within the abdomen or pelvis. 2. Retention of stool at the colocolonic anastomosis in the left colon is unchanged comparison exam. No obstructing lesion identified.  22. Mesenteric lymph node is again demonstrated with no increased size. 4. Stable splenic lesion likely representing a complex cysts or hemangioma. 5. Stable small pericardial effusion 23. Brain MRI 11/11/2014: stable and satisfactory posttherapy appearance of the brain since 2014 and 2015. No new brain metastasis identified. 24. CT chest, abdomen and pelvis w contrast 02/02/2015: 1. No new or progressive findings to suggest recurrent colorectal, cancer in the chest, abdomen, or pelvis. 2. 16 mm soft tissue nodule in the medial right breast. Correlation with mammographic history recommended. 3. Fibroid change in the uterus. 4. Stable hypodense lesion in the anterior spleen, compatible with complex cyst or hemangioma. 5. Stable appearance of the trace pericardial effusion. 25. Brain MRI 10/29/2015: Continued stable and satisfactory posttherapy appearance of the brain. No new intracranial abnormality.    Impression and Plan: 65 year old African-American female, postmenopausal  1. Right breat invasive ductal carcinoma, pT2N0M0, stage IIA, ER+/PR+, HER2-, Oncotype RS 30 -I reviewed her surgical pathology findings with her in great details. -I reviewed her Oncotype DX test results. The recurrence score is 30, which predicts 10 year risk of distant recurrence 20% with tamoxifen alone. This is considered  intermediate risk, but close to high risk group. -. Her recurrence score is very close to high risk group, and she is in good health overall, I recommend her to have adjuvant chemotherapy, with docetaxel and Cytoxan, intravenously every 3 weeks, for total of 4 cycles, which she has completed. -She now has completed adjuvant breast radiation -she is tolerating adjuvant letrozole very well, will continue for a total of 5-10 years   2.  HTN -she has restarted Lisinopril  -continue monitoring BP at home   3. Stage IV Colon Cancer mets to lung and brain, NED --Kyndal continues to do well with no evidence for disease recurrence.  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   -She is clinically doing very well. Her CEA  level has been normal, today's result is still pending  - her last brain MRI in 10/2015 was negative for recurrenc -her recent restaging CT scans in 01/2015 showed no evidence of recurrence - she is clinically doing well, physical exam is unremarkable, we'll continue observation. -follow up with lab CBC, CMP and CEA every 3 months.  5. Peripheral neuropathy, grade 1, secondary to prior chemotherapy  -stable  6 Genetics -Given her positive family and personal history of breast (Mother at age of 63 and cousin) and colon cancer (cousin), she was referred to genetic counselor -Her genetic test was negative  She will continue follow-up with her primary care physician for other medical problems  Plan -continue etrozole  -I will see her back in 3 months   I spent 25 minutes counseling the patient face to face. The total time spent in the appointment was 30 minutes. She knows to call us if she has any concern or issues after the last cycle chemo.    Truitt Merle  12/23/2015

## 2015-12-23 NOTE — Telephone Encounter (Signed)
Gave and printed appt sched and avs for pt for Sept °

## 2015-12-24 LAB — CEA (PARALLEL TESTING): CEA: 1.1 ng/mL

## 2015-12-24 LAB — CEA: CEA: 2.8 ng/mL (ref 0.0–4.7)

## 2016-01-13 DIAGNOSIS — Z853 Personal history of malignant neoplasm of breast: Secondary | ICD-10-CM | POA: Diagnosis not present

## 2016-01-25 ENCOUNTER — Other Ambulatory Visit: Payer: Self-pay | Admitting: Hematology

## 2016-01-25 DIAGNOSIS — Z9889 Other specified postprocedural states: Secondary | ICD-10-CM

## 2016-01-25 DIAGNOSIS — Z853 Personal history of malignant neoplasm of breast: Secondary | ICD-10-CM

## 2016-02-02 ENCOUNTER — Ambulatory Visit
Admission: RE | Admit: 2016-02-02 | Discharge: 2016-02-02 | Disposition: A | Discharge status: Home / Self Care | Payer: Medicare HMO | Source: Ambulatory Visit | Attending: Hematology | Admitting: Hematology

## 2016-02-02 DIAGNOSIS — Z9889 Other specified postprocedural states: Secondary | ICD-10-CM

## 2016-02-02 DIAGNOSIS — R928 Other abnormal and inconclusive findings on diagnostic imaging of breast: Secondary | ICD-10-CM | POA: Diagnosis not present

## 2016-02-02 DIAGNOSIS — Z853 Personal history of malignant neoplasm of breast: Secondary | ICD-10-CM

## 2016-02-08 ENCOUNTER — Other Ambulatory Visit: Payer: Self-pay | Admitting: Radiation Therapy

## 2016-02-08 DIAGNOSIS — C7949 Secondary malignant neoplasm of other parts of nervous system: Principal | ICD-10-CM

## 2016-02-08 DIAGNOSIS — C7931 Secondary malignant neoplasm of brain: Secondary | ICD-10-CM

## 2016-02-25 DIAGNOSIS — E782 Mixed hyperlipidemia: Secondary | ICD-10-CM | POA: Diagnosis not present

## 2016-02-25 DIAGNOSIS — I10 Essential (primary) hypertension: Secondary | ICD-10-CM | POA: Diagnosis not present

## 2016-02-25 DIAGNOSIS — Z0100 Encounter for examination of eyes and vision without abnormal findings: Secondary | ICD-10-CM | POA: Diagnosis not present

## 2016-02-25 DIAGNOSIS — Z23 Encounter for immunization: Secondary | ICD-10-CM | POA: Diagnosis not present

## 2016-02-25 DIAGNOSIS — H524 Presbyopia: Secondary | ICD-10-CM | POA: Diagnosis not present

## 2016-02-25 DIAGNOSIS — H25013 Cortical age-related cataract, bilateral: Secondary | ICD-10-CM | POA: Diagnosis not present

## 2016-02-25 DIAGNOSIS — G8929 Other chronic pain: Secondary | ICD-10-CM | POA: Diagnosis not present

## 2016-02-25 DIAGNOSIS — G620 Drug-induced polyneuropathy: Secondary | ICD-10-CM | POA: Diagnosis not present

## 2016-02-25 DIAGNOSIS — M25561 Pain in right knee: Secondary | ICD-10-CM | POA: Diagnosis not present

## 2016-02-25 DIAGNOSIS — Z713 Dietary counseling and surveillance: Secondary | ICD-10-CM | POA: Diagnosis not present

## 2016-02-25 DIAGNOSIS — J302 Other seasonal allergic rhinitis: Secondary | ICD-10-CM | POA: Diagnosis not present

## 2016-02-25 DIAGNOSIS — Z6831 Body mass index (BMI) 31.0-31.9, adult: Secondary | ICD-10-CM | POA: Diagnosis not present

## 2016-03-03 DIAGNOSIS — M25561 Pain in right knee: Secondary | ICD-10-CM | POA: Diagnosis not present

## 2016-03-03 DIAGNOSIS — M1711 Unilateral primary osteoarthritis, right knee: Secondary | ICD-10-CM | POA: Diagnosis not present

## 2016-03-30 ENCOUNTER — Other Ambulatory Visit (HOSPITAL_BASED_OUTPATIENT_CLINIC_OR_DEPARTMENT_OTHER): Payer: Medicare HMO

## 2016-03-30 ENCOUNTER — Ambulatory Visit (HOSPITAL_BASED_OUTPATIENT_CLINIC_OR_DEPARTMENT_OTHER): Payer: Medicare HMO | Admitting: Hematology

## 2016-03-30 ENCOUNTER — Encounter: Payer: Self-pay | Admitting: Hematology

## 2016-03-30 ENCOUNTER — Telehealth: Payer: Self-pay | Admitting: Hematology

## 2016-03-30 VITALS — BP 140/61 | HR 97 | Temp 98.2°F | Resp 17 | Ht 64.0 in | Wt 185.1 lb

## 2016-03-30 DIAGNOSIS — Z85038 Personal history of other malignant neoplasm of large intestine: Secondary | ICD-10-CM | POA: Diagnosis not present

## 2016-03-30 DIAGNOSIS — C186 Malignant neoplasm of descending colon: Secondary | ICD-10-CM

## 2016-03-30 DIAGNOSIS — Z170 Estrogen receptor positive status [ER+]: Secondary | ICD-10-CM

## 2016-03-30 DIAGNOSIS — I10 Essential (primary) hypertension: Secondary | ICD-10-CM

## 2016-03-30 DIAGNOSIS — Z79811 Long term (current) use of aromatase inhibitors: Secondary | ICD-10-CM | POA: Diagnosis not present

## 2016-03-30 DIAGNOSIS — C50211 Malignant neoplasm of upper-inner quadrant of right female breast: Secondary | ICD-10-CM

## 2016-03-30 DIAGNOSIS — C7931 Secondary malignant neoplasm of brain: Secondary | ICD-10-CM

## 2016-03-30 DIAGNOSIS — G620 Drug-induced polyneuropathy: Secondary | ICD-10-CM

## 2016-03-30 LAB — CBC WITH DIFFERENTIAL/PLATELET
BASO%: 0.6 % (ref 0.0–2.0)
Basophils Absolute: 0 10*3/uL (ref 0.0–0.1)
EOS%: 1.7 % (ref 0.0–7.0)
Eosinophils Absolute: 0.1 10*3/uL (ref 0.0–0.5)
HEMATOCRIT: 43.8 % (ref 34.8–46.6)
HGB: 14 g/dL (ref 11.6–15.9)
LYMPH#: 1.2 10*3/uL (ref 0.9–3.3)
LYMPH%: 14.6 % (ref 14.0–49.7)
MCH: 27.2 pg (ref 25.1–34.0)
MCHC: 32 g/dL (ref 31.5–36.0)
MCV: 85 fL (ref 79.5–101.0)
MONO#: 0.3 10*3/uL (ref 0.1–0.9)
MONO%: 3.3 % (ref 0.0–14.0)
NEUT%: 79.8 % — ABNORMAL HIGH (ref 38.4–76.8)
NEUTROS ABS: 6.7 10*3/uL — AB (ref 1.5–6.5)
PLATELETS: 207 10*3/uL (ref 145–400)
RBC: 5.16 10*6/uL (ref 3.70–5.45)
RDW: 15.1 % — ABNORMAL HIGH (ref 11.2–14.5)
WBC: 8.3 10*3/uL (ref 3.9–10.3)

## 2016-03-30 LAB — COMPREHENSIVE METABOLIC PANEL
ALBUMIN: 3.3 g/dL — AB (ref 3.5–5.0)
ALK PHOS: 128 U/L (ref 40–150)
ALT: 51 U/L (ref 0–55)
ANION GAP: 11 meq/L (ref 3–11)
AST: 32 U/L (ref 5–34)
BILIRUBIN TOTAL: 0.9 mg/dL (ref 0.20–1.20)
BUN: 18.8 mg/dL (ref 7.0–26.0)
CALCIUM: 9.9 mg/dL (ref 8.4–10.4)
CO2: 26 mEq/L (ref 22–29)
CREATININE: 1 mg/dL (ref 0.6–1.1)
Chloride: 107 mEq/L (ref 98–109)
EGFR: 72 mL/min/{1.73_m2} — ABNORMAL LOW (ref >90)
Glucose: 120 mg/dl (ref 70–140)
Potassium: 3.9 mEq/L (ref 3.5–5.1)
Sodium: 145 mEq/L (ref 136–145)
TOTAL PROTEIN: 7.5 g/dL (ref 6.4–8.3)

## 2016-03-30 LAB — CEA (IN HOUSE-CHCC): CEA (CHCC-In House): 1.84 ng/mL (ref 0.00–5.00)

## 2016-03-30 NOTE — Telephone Encounter (Signed)
GAVE PATIENT AVS REPORT AND APPOINTMENTS FOR December

## 2016-03-30 NOTE — Progress Notes (Signed)
Hematology and Oncology Follow Up Visit Date of Visit: 03/30/2016   RAVNEET SPILKER [id number] 1951-04-06 65 y.o. 03/30/2016   PCP: [facility] [id number])  Principle Diagnosis:  1. Colon cancer, T4N1M1, Stage IV, diagnosed on 10/27/2008, brain recurrence in 03/2011  2. Stage IIA right breast cancer, diagnosed in 02/2015  Oncology History   Breast cancer of upper-inner quadrant of right female breast [facility])   Staging form: Breast, AJCC 7th Edition     Clinical: Stage IIA (T2, N0, M0) - Unsigned     Pathologic stage from 04/14/2015: Stage IIA (T2, N0, cM0) - Signed by Truitt Merle, MD on 05/05/2015 Cancer of left colon [facility])   Staging form: Colon and Rectum, AJCC 7th Edition     Pathologic: T4a, N1a, M1 - Unsigned        Breast cancer of upper-inner quadrant of right female breast ([place])   02/27/2015 Mammogram    Diagnostic mammogram showed a 2.5 cm irregular mass within the far posterior upper inner right breast, ultrasound confirmed a 1.9 x 1.0 x 0.8 cm mass at 1:30 o'clock 15 submitted from nipple. No axillary adenopathy      03/03/2015 Initial Biopsy    Right breast needle biopsy showed invasive ductal carcinoma, grade 1-2.      03/03/2015 Receptors her2    ER 100%+, PR 80%+, ki67 15%, HER2/neu negative      03/03/2015 Clinical Stage    Stage IIA: T2 N0      03/18/2015 Procedure    VUS at POLD1 gene called c.327G>C. neg at APC, ATM, AXIN2, BARD1, BMPR1A, BRCA1, BRCA2, BRIP1, CDH1, CDK4, CDKN2A, CHEK2, EPCAM, FANCC, MLH1, MSH2, MSH6, MUTYH, NBN, PALB2, PMS2, POLD1, POLE, PTEN, RAD51C, RAD51D, SCG5/GREM1, SMAD4, STK11, TP53, VHL,XRCC2      04/14/2015 Surgery    Right breast lumpectomy and sentinel lymph node biopsy. Surgical margins were negative.      04/14/2015 Pathology Results    right breast invasive ductal carcinoma, grade 2, 2.7 cm,(+) DCIS, margins were negative, 4 sentinel lymph nodes and one axillary lymph nodes were negative, (+)lymphovascular  invasion.      04/14/2015 Pathologic Stage    Stage IIA: T2 N0      04/14/2015 Oncotype testing    RS 30, which predicts 10-year risk of distance recurrence with tamoxifen alone 20%, intermediate risk      05/21/2015 - 07/24/2015 Adjuvant Chemotherapy     Adjuvant chemotherapy with docetaxel 75 mg/m, and Cytoxan 600 mg/m X 4 cycles      05/26/2015 - 05/29/2015 Hospital Admission    Pt was admitted for UTI and syncope, treated with antibiotics and IVF       08/17/2015 - 09/13/2015 Radiation Therapy    Adjuvant RT: Right breast treated to 42.5 Gy in 17 fractions, Right breast boost treated to 7.5 Gy in 3 fractions      10/14/2015 -  Anti-estrogen oral therapy    Letrozole 2.5 mg daily      11/13/2015 Survivorship    Survivorship visit completed      Oncology History: Diagnosis of colon cancer dates back to 10/27/2008 when [name] presented with what turned out to be a bowel perforation through tumor. Stage at that time was T4 N1 M1 with pulmonary metastatic disease. The K-ras mutation was detected. [name] underwent surgical resection of her tumor with a colostomy at the time of her surgery on 10/27/2008. The colostomy has been subsequently reversed on 09/20/2010. Daziya received chemotherapy consisting of FOLFOX for 10 treatments  from 12/10/2008 through 06/02/2009. She achieved a partial remission from these treatments. She then received additional chemotherapy with 5-FU, leucovorin, and 5-FU by continuous infusion along with Avastin from 06/23/2009 through 11/17/2009. Anai had some peripheral neuropathy in her feet from the oxaliplatin. She was doing well without evidence of disease until she developed a right hemiparesthesia in September 2012 and was found to have a 3 x 3 x 2.4 cm, lobulated, enhancing mass in the left parietal lobe with marked surrounding edema. A PET scan on 03/25/2011 showed resolution of the previously identified pulmonary nodules with no residual hypermetabolic  activity. The pericardial effusion, which has been present since diagnosis, was unchanged. There were also uterine fibroids and a low-density lesion in the anterior spleen that was stable and not associated with any hypermetabolic activity. Of note, Majorie had a markedly elevated CEA up to 24.7 on 03/23/2011. On 06/23/2011, the CEA was less than 0.5. Dr. Erline Levine resected the recurrent metastatic colon cancer on 03/31/2011. This was followed by stereotactic radiation on 04/15/2011. The patient underwent a left-sided craniotomy and excision of the mass involving her left parietal lobe on 11/10/2011 by Dr. Erline Levine. The pathology report was negative for any malignancy. Pathology report indicated benign brain with fibrosis, hemorrhage, hemosiderin deposition, abundant dystrophic calcifications, and mixed acute and chronic inflammation including foreign body multinucleated giant cells.    Prior Therapy:   1. [name] underwent surgical resection of her tumor with a colostomy at the time of her surgery on 10/27/2008. The colostomy has been subsequently reversed on 09/20/2010. Karyme received chemotherapy consisting of FOLFOX for 10 treatments from 12/10/2008 through 06/02/2009. She achieved a partial remission from these treatments. She then received additional chemotherapy with 5-FU, leucovorin, and 5-FU by continuous infusion along with Avastin from 06/23/2009 through 11/17/2009. 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  2. See above for her breast cancer treatment history   Current therapy:  adjuvant letrozole 2.36m daily started on 10/17/2015  Interim History:  DKristianereturns for follow-up. She is doing well overall, she still has moderate hot flash, but tolerable. She denies any significant muscular and joint discomfort. She reports feeling "jittery" sometime, no mood swings or depression, no other neurological symptoms. She has good appetite, eats well, weight is stable.  No other new complaints.  Past Medical History:  Diagnosis Date  . Allergy    sulfa  . Arthritis   . Blood transfusion   . 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 Colon cancer ([place]    colon/ 2010/surg/chemo  . Diverticulosis 07/22/2010   sigmoid colon  . Family history of breast cancer   . Family history of colon cancer   . GERD (gastroesophageal reflux disease)   . Headache(784.0)   . Heart murmur   . History of radiation therapy 04/15/11   17 Gy single fraction  l parietal brain metastais  . Hypertension   . LVH (left ventricular hypertrophy)    mod/severe. echo 1/11. EF 65-70%   . [place]romuscular disorder ([place]    peripheral neuropathy feet  . Pericardial effusion    echo 08/13/09  . Peripheral vascular disease ([place]   . S/P radiation therapy 08/18/15-09/14/15   right breast  . Shortness of breath   . [place]rombocytopenia ([place]    GYN HISTORY  Menarchal: 14 LMP: 54 Contraceptive: a few years  HRT: no  G2P2:    Medications: I have reviewed the patient's current medications.  Current Outpatient  Prescriptions  Medication Sig Dispense Refill  . acetaminophen (TYLENOL) 500 MG tablet Take 1,000 mg by mouth every 6 (six) hours as needed for mild pain, moderate pain or headache. Reported on 09/30/2015    . fluticasone (FLONASE) 50 MCG/ACT nasal spray Place 1 spray into both nostrils daily as needed for allergies. Reported on 11/12/2015    . gabapentin (NEURONTIN) 300 MG capsule TAKE ONE CAPSULE BY MOUTH AT BEDTIME (Patient taking differently: TAKE 300 MG BY MOUTH AT BEDTIME) 90 capsule 0  . letrozole (FEMARA) 2.5 MG tablet Take 1 tablet (2.5 mg total) by mouth daily. 90 tablet 1  . lisinopril (PRINIVIL,ZESTRIL) 40 MG tablet Take 1 tablet (40 mg total) by mouth daily. 90 tablet 0  . non-metallic deodorant (ALRA) MISC Apply 1 application topically daily as needed. Reported on 11/12/2015    . oxyCODONE (OXY IR/ROXICODONE) 5 MG immediate release tablet Take 1-2  tablets (5-10 mg total) by mouth every 4 (four) hours as needed for moderate pain, severe pain or breakthrough pain. 40 tablet 0  . pravastatin (PRAVACHOL) 20 MG tablet Take 20 mg by mouth daily.      No current facility-administered medications for this visit.    Allergies:  Allergies  Allergen Reactions  . Sulfonamide Derivatives Rash    Past Medical History, Surgical history, Social history, and Family History were reviewed and updated.  SMOKING HISTORY:  The patient has smoked intermittently, about 2 packs of cigarettes per week, since she was a teenager, but stopped smoking in 2010.  Review of Systems: Constitutional:  Negative for fever, chills, night sweats, anorexia, weight loss, pain. Cardiovascular: no chest pain or dyspnea on exertion Respiratory: no cough, shortness of breath, or wheezing Neurological: no TIA or stroke symptoms Dermatological: negative for rash ENT: negative for - epistaxis, headaches, tinnitus, vertigo or visual changes Skin: Negative. Gastrointestinal: no abdominal pain, change in bowel habits, or black or bloody stools positive for - heartburn Genito-Urinary: no dysuria, trouble voiding, or hematuria Hematological and Lymphatic: negative for - bleeding problems, fatigue, jaundice or pallor Breast: negative for breast lumps Musculoskeletal: positive for - muscular weakness and pins and needles with some numbness (R>L) in lower extremities negative for - gait disturbance Remaining ROS negative.  Physical Exam: Blood pressure 140/61, pulse 97, temperature 98.2 F (36.8 C), temperature source Oral, resp. rate 17, height 5' 4"  (1.626 m), weight 185 lb 1.6 oz (84 kg), SpO2 99 %. ECOG: 0 General appearance: alert, cooperative, appears stated age, no distress and mildly obese Head: Normocephalic, without obvious abnormality, atraumatic Neck: no adenopathy, supple, symmetrical, trachea midline and thyroid not enlarged, symmetric, no  tenderness/mass/nodules HEENT: PERRLA; EOMi; No sclerae icterus. OP clear of masses.  Lymph nodes: Cervical, supraclavicular, and axillary nodes normal. Heart:regular rate and rhythm, S1, S2 normal, no murmur, click, rub or gallop Lung:chest clear, no wheezing, rales, normal symmetric air entry,  + R sided port-a-cath. Abdomin: soft, non-tender, without masses or organomegaly, normal bowel sounds and surgical scars well healed. EXT:No peripheral edema.  R>L decreased sensation and mildly decreased strength (noted in prior exams) Neuro: R lower extremity weakness and decreased sensation.  Normal gait.  Good finger to nose bilaterally. Otherwise no focal deficits. Breasts: Breast inspection showed them to be symmetrical with no nipple discharge. The surgical incision in right breast and axilla are well-healed, no tenderness. (+) Diffuse skin pigmentation of the right breast.  Palpation of the breasts and axilla revealed no obvious mass that I could appreciate.    Lab Results:  CBC Latest Ref Rng & Units 03/30/2016 12/23/2015 09/30/2015  WBC 3.9 - 10.3 10e3/uL 8.3 7.0 6.2  Hemoglobin 11.6 - 15.9 g/dL 14.0 14.1 13.7  Hematocrit 34.8 - 46.6 % 43.8 42.0 42.8  Platelets 145 - 400 10e3/uL 207 211 218    CMP Latest Ref Rng & Units 03/30/2016 12/23/2015 09/30/2015  Glucose 70 - 140 mg/dl 120 96 102  BUN 7.0 - 26.0 mg/dL 18.8 14.7 9.3  Creatinine 0.6 - 1.1 mg/dL 1.0 0.9 0.8  Sodium 136 - 145 mEq/L 145 142 141  Potassium 3.5 - 5.1 mEq/L 3.9 4.1 4.1  Chloride 101 - 111 mmol/L - - -  CO2 22 - 29 mEq/L 26 27 26   Calcium 8.4 - 10.4 mg/dL 9.9 9.8 9.5  Total Protein 6.4 - 8.3 g/dL 7.5 7.3 7.1  Total Bilirubin 0.20 - 1.20 mg/dL 0.90 1.04 0.87  Alkaline Phos 40 - 150 U/L 128 95 100  AST 5 - 34 U/L 32 22 35(H)  ALT 0 - 55 U/L 51 24 37   Results for ROWYNN, MCWEENEY (MRN [id number]) as of 03/30/2016 17:03  Ref. Range 09/30/2015 10:40 12/23/2015 11:20 03/30/2016 09:56  CEA Latest Units: ng/mL 1.0 1.1   CEA Latest  Ref Range: 0.0 - 4.7 ng/mL 2.4 2.8   CEA ([facility]) Latest Ref Range: 0.00 - 5.00 ng/mL   1.84    PATHOLOGY REPORT Diagnosis 04/14/2015 1. Breast, lumpectomy, right - INVASIVE GRADE II DUCTAL CARCINOMA, SPANNING 2.7 CM IN GREATEST DIMENSION. - ASSOCIATED INTERMEDATE GRADE DUCTAL CARCINOMA IN SITU. - LYMPH/VASCULAR INVASION IS IDENTIFIED. - MARGINS ARE NEGATIVE. - SEE ONCOLOGY TEMPLATE. 2. Lymph node, sentinel, biopsy, right axillary - ONE BENIGN LYMPH NODE WITH NO TUMOR SEEN (0/1). 3. Lymph node, sentinel, biopsy, right axillary - ONE BENIGN LYMPH NODE WITH NO TUMOR SEEN (0/1). 4. Lymph node, sentinel, biopsy, right axillary - ONE BENIGN LYMPH NODE WITH NO TUMOR SEEN (0/1). 5. Lymph node, biopsy, right axillary - ONE BENIGN LYMPH NODE WITH NO TUMOR SEEN (0/1). Microscopic Comment 1. BREAST, INVASIVE TUMOR, WITH LYMPH NODES PRESENT Specimen, including laterality and lymph node sampling (sentinel, non-sentinel): Right partial breast with right sentinel lymph node sampling. Procedure: Right breast lumpectomy with right sentinel lymph node biopsies. Histologic type: Invasive ductal carcinoma. Grade: 2. Tubule formation: 3. Nuclear pleomorphism: 2. Mitotic: 1. Tumor size (gross measurement): 2.7 cm. Margins: Invasive, distance to closest margin: 0.2 cm (anterior margin). In-situ, distance to closest margin: At least 0.4 cm (all margins). Lymphovascular invasion: Yes, lymph/vascular invasion is identified. Ductal carcinoma in situ: Yes. Grade: Intermediate grade. Extensive intraductal component: No. Lobular neoplasia: Not identified. Tumor focality: Unifocal. Treatment effect: Not applicable. Extent of tumor: Tumor confined to breast parenchyma. Skin: Not received. Nipple: Not received. Skeletal muscle: Not received. Lymph nodes Examined: 3 Sentinel. 1 Non-sentinel. 4 Total. Lymph nodes with metastasis: 0. Isolated tumor cells (< 0.2 mm): 0. Micrometastasis: (> 0.2 mm  and < 2.0 mm): 0. Macrometastasis: (> 2.0 mm): 0. Extracapsular extension: Not applicable. Breast prognostic profile: Performed on previous case [id number]: Estrogen receptor: 100%, positive. Progesterone receptor: 80%, positive. Her-2 neu: 1.30 ratio, negative. Ki-67: 15%. Non-neoplastic breast: Unremarkable. TNM: pT2, pN0. Comments: As Her-2 neu was previously negative, this will be repeated on representative tumor from the current specimen, and will be reported in an addendum to follow. (RH:ds 04/15/15)  Results: HER2 - NEGATIVE RATIO OF HER2/CEP17 SIGNALS 1.17 AVERAGE HER2 COPY NUMBER PER CELL 2.05   Radiological Studies: 1. MRI of the head with and without  IV contrast on 03/23/2011 showed a solitary enhancing mass in the left parietal lobe with surrounding white matter edema, most compatible with solitary metastatic deposit. The mass lesion measured 2.7 x 2.5 cm.  2. PET scan from 03/25/2011 showed resolution of the previously- identified pulmonary nodules with no residual hypermetabolic activity noted in the neck, chest, abdomen or pelvis to suggest active malignancy. There was a pericardial effusion and some subsegmental atelectasis in the lower lobes. There were also uterine fibroids and a low-density lesion in the anterior spleen that was stable and not associated with hypermetabolic activity.  3. MRI of the head with and without IV contrast showed Stealth protocol utilized to evaluate left parietal mass. The mass measured 3 x 3 x 2.4 cm with marked surrounding vasogenic edema.  4. MRI of the head with and without IV contrast on 04/08/2011 showed postsurgical changes of tumor removal in the left parietal lobe and a postsurgical hematoma with enhancement. There was 3 mm of midline shift. No other metastatic deposits.  5. MRI of the head with and without IV contrast on 06/28/2011 showed a 3 cm area of restricted diffusion lying within the previous area of  left parietal surgical  resection for colon cancer. There was enhancement of the peripheral aspect of the cavity, marked restricted diffusion and increasing vasogenic edema. There was concern about the development of a brain abscess.  6. MRI of the head with and without IV contrast on 07/29/2011 showed left parietal postoperative hematoma was smaller compared with the  prior study. There was no nodular enhancement seen to indicate tumor. No new enhancing lesions were seen. There was significant  improvement in the left parietal edema.  7. Digital screening mammogram was negative on 08/02/2011.  8. An MRI of the head with and without IV contrast on 10/21/2011 showed development of gyriform enhancement circumferentially along the margins of the left parietal postoperative space/hematoma. Marked increase in vasogenic edema was seen. 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 There was further contraction of the hematoma/postoperative space in the left parietal cortical region, now measuring 17 x 19 mm in transverse diameter as opposed to 22 x 21 mm previously.  Left-to-right shift was 2 mm.  9. Nuclear medicine brain PET-CT scan carried out on 10/27/2011 showed hypermetabolic activity in the high left parietal lobe which corresponds to gyriform enhancement on comparison MRI from 10/21/2011. This was felt to be most consistent with recurrent  tumor.  10. Chest x-ray, 2 view from 11/04/2011, showed chronic cardiomegaly. Port-A-Cath was in place. No acute disease was present.  11. MRI of the head with and without IV contrast on 01/27/2012 showed findings that were felt to be consistent with progression of disease. This exam was compared with the MRI of 10/21/2011. It was recognized that the patient underwent surgical resection of the mass on 11/10/2011.  There was felt to be on the present exam residual peripheral enhancement of approximately 35 x 40 x 21 mm with infiltration of  the cortex and deep white matter. It was felt that the enhancement had progressed, despite removal of the central focus. There was moderate vasogenic  edema throughout the left hemisphere but slightly decreased from the MRI from 10/21/2011. No new lesions were seen. There was mild atrophy with chronic microvascular ischemic change, but no midline shift. Major intracranial vascular structures were patent.  12. MRI of the brain with and without IV contrast on 05/04/2012 showed previous left parietal lobe  surgery for resection of tumor and radiation necrosis.  No new abnormalities were noted. 13. Chest x-ray, 2 view, on 07/03/2012 showed enlargement of the cardiac silhouette with some lingular scarring, as compared with the chest x-ray from 11/04/2011. 14. 2-D echocardiogram from 07/12/2012 showed left ventricular ejection fraction of 55-60%.  There was a small to moderate circumferential pericardial effusion with no signs of tamponade.  There was felt to be no significant change from the 2-D echocardiogram dated 08/13/2009. 15. Digital bilateral screening mammogram on 08/06/2012 was negative. 16. 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  There was no progression or new brain metastasis identified. 17. PET scan from 10/16/2012 showed no specific features to suggest metastatic disease.  There was diffuse uptake throughout the thyroid gland, possibly due to hyperthyroidism. 18. MRI of the brain with and without IV contrast on 03/12/2013 showed residual gyriform enhancement surrounding the area of previous surgical resection of a left parietal metastasis with subsequent resection of radionecrosis. The findings likely represent some residual brain injury, but there is no significant progression of radionecrosis and no new lesions seen. 19. MRI of the brain with  and without IV contrast on 11/01/2013 showed Stable posttherapy appearance of the brain since May of 2014. No progression or new brain metastasis identified. 20. CT chest,abomen and pelvis with contrast. 1. No evidence of thoracic metastasis.  2. Small prevascular lymph node is similar prior. 3. Stable pericardial effusion. 4. Interval increase in the volume presacral lymph node in the upper pelvis. This has progressed slowly from 2011. Recommend attention on follow-up versus restaging FDG PET scan. 5. Stable anastomosis in the descending colon. 21. CT chest, abdomen and pelvis with contrast 07/126/2015. 1. No evidence of local colon cancer recurrence or metastasis within the abdomen or pelvis. 2. Retention of stool at the colocolonic anastomosis in the left colon is unchanged comparison exam. No obstructing lesion identified.  22. Mesenteric lymph node is again demonstrated with no increased size. 4. Stable splenic lesion likely representing a complex cysts or hemangioma. 5. Stable small pericardial effusion 23. Brain MRI 11/11/2014: stable and satisfactory posttherapy appearance of the brain since 2014 and 2015. No new brain metastasis identified. 24. CT chest, abdomen and pelvis w contrast 02/02/2015: 1. No new or progressive findings to suggest recurrent colorectal, cancer in the chest, abdomen, or pelvis. 2. 16 mm soft tissue nodule in the medial right breast. Correlation with mammographic history recommended. 3. Fibroid change in the uterus. 4. Stable hypodense lesion in the anterior spleen, compatible with complex cyst or hemangioma. 5. Stable appearance of the trace pericardial effusion. 25. Brain MRI 10/29/2015: Continued stable and satisfactory posttherapy appearance of the brain. No new intracranial abnormality. 26. Bilateral diagnostic mammogram showed lumpectomy change in the right breast, no evidence of malignancy.    Impression and Plan: 65 year old African-American female,  postmenopausal  1. Right breat invasive ductal carcinoma, pT2N0M0, stage IIA, ER+/PR+, HER2-, Oncotype RS 30 -I reviewed her surgical pathology findings with her in great details. -I reviewed her Oncotype DX test results. The recurrence score is 30, which predicts 10 year risk of distant recurrence 20% with tamoxifen alone. This is considered intermediate risk, but close to high risk group. -She received 4 cycles of adjuvant chemotherapy TC -She now has completed adjuvant breast radiation -she is tolerating adjuvant letrozole very well, will continue for a total of 5-10 years  -  We'll continue breast cancer surveillance, including annual mammogram, self exam, and routine follow-up -She is clinically doing well, screening mammogram in July was negative, clinical exam are unremarkable, her lab results reviewed with her, normal CBC and CMP, no evidence of recurrence  2.  HTN -she has restarted Lisinopril  -continue monitoring BP at home   3. Stage IV Colon Cancer mets to lung and brain, NED --Anyla continues to do well with no evidence for disease recurrence.  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   -She is clinically doing very well. Her CEA level has been normal, today's result is still pending  - her last brain MRI in 10/2015 was negative for recurrenc -her last restaging CT scans in 01/2015 showed no evidence of recurrence - she is clinically doing well, physical exam is unremarkable,  CEA has been normal, we'll continue observation. - she is 5 years out of her brain recurrence, follow up with lab CBC, CMP and CEA every 3 months. I do not plan to do surveillance CT scan  4. Peripheral neuropathy, grade 1, secondary to prior chemotherapy  -stable  5 Genetics -Given her positive family and personal history of breast (Mother at age of 71 and cousin) and colon cancer (cousin), she was referred to genetic  counselor -Her genetic test was negative  She will continue follow-up with her primary care physician for other medical problems  Plan -continue letrozole  -I will see her back in 3 months   I spent 25 minutes counseling the patient face to face. The total time spent in the appointment was 30 minutes. She knows to call us if she has any concern or issues after the last cycle chemo.    Truitt Merle  03/30/2016

## 2016-03-31 LAB — CEA: CEA1: 2.3 ng/mL (ref 0.0–4.7)

## 2016-04-29 ENCOUNTER — Ambulatory Visit
Admission: RE | Admit: 2016-04-29 | Discharge: 2016-04-29 | Disposition: A | Discharge status: Home / Self Care | Payer: Medicare HMO | Source: Ambulatory Visit | Attending: Radiation Oncology | Admitting: Radiation Oncology

## 2016-04-29 DIAGNOSIS — C7949 Secondary malignant neoplasm of other parts of nervous system: Principal | ICD-10-CM

## 2016-04-29 DIAGNOSIS — C50919 Malignant neoplasm of unspecified site of unspecified female breast: Secondary | ICD-10-CM | POA: Diagnosis not present

## 2016-04-29 DIAGNOSIS — C7931 Secondary malignant neoplasm of brain: Secondary | ICD-10-CM

## 2016-04-29 MED ORDER — GADOBENATE DIMEGLUMINE 529 MG/ML IV SOLN
17.0000 mL | Freq: Once | INTRAVENOUS | Status: AC | PRN
  Administered 2016-04-29: 17 mL via INTRAVENOUS

## 2016-05-02 ENCOUNTER — Encounter: Payer: Self-pay | Admitting: Radiation Oncology

## 2016-05-02 ENCOUNTER — Ambulatory Visit
Admission: RE | Admit: 2016-05-02 | Discharge: 2016-05-02 | Disposition: A | Discharge status: Home / Self Care | Payer: Medicare HMO | Source: Ambulatory Visit | Attending: Radiation Oncology | Admitting: Radiation Oncology

## 2016-05-02 VITALS — BP 143/92 | HR 80 | Temp 97.7°F | Resp 16 | Ht 64.0 in | Wt 182.4 lb

## 2016-05-02 DIAGNOSIS — Z85038 Personal history of other malignant neoplasm of large intestine: Secondary | ICD-10-CM | POA: Insufficient documentation

## 2016-05-02 DIAGNOSIS — C7931 Secondary malignant neoplasm of brain: Secondary | ICD-10-CM

## 2016-05-02 NOTE — Progress Notes (Addendum)
Follow up s/p right breast 08/17/15-2/6/17last mammogram 02/02/16,  , SRS brain  Back in 2012 with Dr.Manning,   On femara 2.'5mg'$  daily , daily, and  Had MRI 04/29/16 here for results,  Last seen Dr. Burr Medico 03/30/16, no c/o pain, head aches, nausea, occasional light headedness stated 4:21 PM BP (!) 143/92 (BP Location: Left Arm, Patient Position: Sitting, Cuff Size: Normal)   Pulse 80   Temp 97.7 F (36.5 C) (Oral)   Resp 16   Ht '5\' 4"'$  (1.626 m)   Wt 182 lb 6.4 oz (82.7 kg)   SpO2 99% Comment: room air  BMI 31.31 kg/m   Wt Readings from Last 3 Encounters:  05/02/16 182 lb 6.4 oz (82.7 kg)  03/30/16 185 lb 1.6 oz (84 kg)  12/23/15 182 lb 3.2 oz (82.6 kg)

## 2016-05-06 ENCOUNTER — Other Ambulatory Visit: Payer: Self-pay | Admitting: Radiation Therapy

## 2016-05-06 DIAGNOSIS — C7949 Secondary malignant neoplasm of other parts of nervous system: Principal | ICD-10-CM

## 2016-05-06 DIAGNOSIS — C7931 Secondary malignant neoplasm of brain: Secondary | ICD-10-CM

## 2016-06-06 ENCOUNTER — Other Ambulatory Visit: Payer: Self-pay | Admitting: Hematology

## 2016-06-06 DIAGNOSIS — C50211 Malignant neoplasm of upper-inner quadrant of right female breast: Secondary | ICD-10-CM

## 2016-06-29 ENCOUNTER — Encounter: Payer: Self-pay | Admitting: Hematology

## 2016-06-29 ENCOUNTER — Other Ambulatory Visit (HOSPITAL_BASED_OUTPATIENT_CLINIC_OR_DEPARTMENT_OTHER): Payer: Medicare HMO

## 2016-06-29 ENCOUNTER — Ambulatory Visit (HOSPITAL_BASED_OUTPATIENT_CLINIC_OR_DEPARTMENT_OTHER): Payer: Medicare HMO | Admitting: Hematology

## 2016-06-29 ENCOUNTER — Telehealth: Payer: Self-pay | Admitting: Hematology

## 2016-06-29 VITALS — BP 122/74 | HR 85 | Temp 98.2°F | Resp 18 | Ht 64.0 in | Wt 189.4 lb

## 2016-06-29 DIAGNOSIS — Z85038 Personal history of other malignant neoplasm of large intestine: Secondary | ICD-10-CM

## 2016-06-29 DIAGNOSIS — G620 Drug-induced polyneuropathy: Secondary | ICD-10-CM

## 2016-06-29 DIAGNOSIS — Z79811 Long term (current) use of aromatase inhibitors: Secondary | ICD-10-CM

## 2016-06-29 DIAGNOSIS — C186 Malignant neoplasm of descending colon: Secondary | ICD-10-CM

## 2016-06-29 DIAGNOSIS — E2839 Other primary ovarian failure: Secondary | ICD-10-CM

## 2016-06-29 DIAGNOSIS — C50211 Malignant neoplasm of upper-inner quadrant of right female breast: Secondary | ICD-10-CM

## 2016-06-29 DIAGNOSIS — I10 Essential (primary) hypertension: Secondary | ICD-10-CM

## 2016-06-29 DIAGNOSIS — Z170 Estrogen receptor positive status [ER+]: Secondary | ICD-10-CM

## 2016-06-29 LAB — COMPREHENSIVE METABOLIC PANEL
ALT: 36 U/L (ref 0–55)
AST: 29 U/L (ref 5–34)
Albumin: 3.5 g/dL (ref 3.5–5.0)
Alkaline Phosphatase: 124 U/L (ref 40–150)
Anion Gap: 10 mEq/L (ref 3–11)
BUN: 11.7 mg/dL (ref 7.0–26.0)
CHLORIDE: 106 meq/L (ref 98–109)
CO2: 26 meq/L (ref 22–29)
Calcium: 9.8 mg/dL (ref 8.4–10.4)
Creatinine: 0.8 mg/dL (ref 0.6–1.1)
GLUCOSE: 103 mg/dL (ref 70–140)
POTASSIUM: 4 meq/L (ref 3.5–5.1)
SODIUM: 142 meq/L (ref 136–145)
TOTAL PROTEIN: 7.5 g/dL (ref 6.4–8.3)
Total Bilirubin: 0.84 mg/dL (ref 0.20–1.20)

## 2016-06-29 LAB — CBC WITH DIFFERENTIAL/PLATELET
BASO%: 0.6 % (ref 0.0–2.0)
BASOS ABS: 0.1 10*3/uL (ref 0.0–0.1)
EOS ABS: 0.2 10*3/uL (ref 0.0–0.5)
EOS%: 2.4 % (ref 0.0–7.0)
HEMATOCRIT: 43.1 % (ref 34.8–46.6)
HEMOGLOBIN: 13.8 g/dL (ref 11.6–15.9)
LYMPH#: 1.6 10*3/uL (ref 0.9–3.3)
LYMPH%: 18.4 % (ref 14.0–49.7)
MCH: 27 pg (ref 25.1–34.0)
MCHC: 31.9 g/dL (ref 31.5–36.0)
MCV: 84.5 fL (ref 79.5–101.0)
MONO#: 0.6 10*3/uL (ref 0.1–0.9)
MONO%: 7 % (ref 0.0–14.0)
NEUT#: 6.1 10*3/uL (ref 1.5–6.5)
NEUT%: 71.6 % (ref 38.4–76.8)
Platelets: 200 10*3/uL (ref 145–400)
RBC: 5.1 10*6/uL (ref 3.70–5.45)
RDW: 14.7 % — AB (ref 11.2–14.5)
WBC: 8.5 10*3/uL (ref 3.9–10.3)

## 2016-06-29 LAB — CEA (IN HOUSE-CHCC): CEA (CHCC-In House): 2.48 ng/mL (ref 0.00–5.00)

## 2016-06-29 NOTE — Telephone Encounter (Signed)
Appointments scheduled per 06/29/16 los. A copy of the AVS report and appointment schedule was given to the patient per 06/29/16 los.

## 2016-06-29 NOTE — Progress Notes (Signed)
Hematology and Oncology Follow Up Visit Date of Visit: 06/29/2016   Kayla [name] [id number] 1951-04-27 65 y.o. 06/29/2016   PCP: [facility] [phone])  Principle Diagnosis:  1. Colon cancer, T4N1M1, Stage IV, diagnosed on 10/27/2008, brain recurrence in 03/2011  2. Stage IIA right breast cancer, diagnosed in 02/2015  Oncology History   Breast cancer of upper-inner quadrant of right female breast [facility])   Staging form: Breast, AJCC 7th Edition     Clinical: Stage IIA (T2, N0, M0) - Unsigned     Pathologic stage from 04/14/2015: Stage IIA (T2, N0, cM0) - Signed by Truitt Merle, MD on 05/05/2015 Cancer of left colon [facility])   Staging form: Colon and Rectum, AJCC 7th Edition     Pathologic: T4a, N1a, M1 - Unsigned        Breast cancer of upper-inner quadrant of right female breast ([place])   02/27/2015 Mammogram    Diagnostic mammogram showed a 2.5 cm irregular mass within the far posterior upper inner right breast, ultrasound confirmed a 1.9 x 1.0 x 0.8 cm mass at 1:30 o'clock 15 submitted from nipple. No axillary adenopathy      03/03/2015 Initial Biopsy    Right breast needle biopsy showed invasive ductal carcinoma, grade 1-2.      03/03/2015 Receptors her2    ER 100%+, PR 80%+, ki67 15%, HER2/neu negative      03/03/2015 Clinical Stage    Stage IIA: T2 N0      03/18/2015 Procedure    VUS at POLD1 gene called c.327G>C. neg at APC, ATM, AXIN2, BARD1, BMPR1A, BRCA1, BRCA2, BRIP1, CDH1, CDK4, CDKN2A, CHEK2, EPCAM, FANCC, MLH1, MSH2, MSH6, MUTYH, NBN, PALB2, PMS2, POLD1, POLE, PTEN, RAD51C, RAD51D, SCG5/GREM1, SMAD4, STK11, TP53, VHL,XRCC2      04/14/2015 Surgery    Right breast lumpectomy and sentinel lymph node biopsy. Surgical margins were negative.      04/14/2015 Pathology Results    right breast invasive ductal carcinoma, grade 2, 2.7 cm,(+) DCIS, margins were negative, 4 sentinel lymph nodes and one axillary lymph nodes were negative, (+)lymphovascular  invasion.      04/14/2015 Pathologic Stage    Stage IIA: T2 N0      04/14/2015 Oncotype testing    RS 30, which predicts 10-year risk of distance recurrence with tamoxifen alone 20%, intermediate risk      05/21/2015 - 07/24/2015 Adjuvant Chemotherapy     Adjuvant chemotherapy with docetaxel 75 mg/m, and Cytoxan 600 mg/m X 4 cycles      05/26/2015 - 05/29/2015 Hospital Admission    Pt was admitted for UTI and syncope, treated with antibiotics and IVF       08/17/2015 - 09/13/2015 Radiation Therapy    Adjuvant RT: Right breast treated to 42.5 Gy in 17 fractions, Right breast boost treated to 7.5 Gy in 3 fractions      10/14/2015 -  Anti-estrogen oral therapy    Letrozole 2.5 mg daily      11/13/2015 Survivorship    Survivorship visit completed      Oncology History: Diagnosis of colon cancer dates back to 10/27/2008 when Kayla [name] presented with what turned out to be a bowel perforation through tumor. Stage at that time was T4 N1 M1 with pulmonary metastatic disease. The K-ras mutation was detected. Kayla [name] underwent surgical resection of her tumor with a colostomy at the time of her surgery on 10/27/2008. The colostomy has been subsequently reversed on 09/20/2010. Kayla [name] received chemotherapy consisting of FOLFOX for 10 treatments  from 12/10/2008 through 06/02/2009. She achieved a partial remission from these treatments. She then received additional chemotherapy with 5-FU, leucovorin, and 5-FU by continuous infusion along with Avastin from 06/23/2009 through 11/17/2009. 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 She was doing well without evidence of disease until she developed a right hemiparesthesia in September 2012 and was found to have a 3 x 3 x 2.4 cm, lobulated, enhancing mass in the left parietal lobe with marked surrounding edema. A PET scan on 03/25/2011 showed resolution of the previously identified pulmonary nodules with no residual hypermetabolic  activity. The pericardial effusion, which has been present since diagnosis, was unchanged. There were also uterine fibroids and a low-density lesion in the anterior spleen that was stable and not associated with any hypermetabolic activity. Of note, Kayla [name] had a markedly elevated CEA up to 24.7 on 03/23/2011. On 06/23/2011, the CEA was less than 0.5. Dr. Erline Levine resected the recurrent metastatic colon cancer on 03/31/2011. This was followed by stereotactic radiation on 04/15/2011. The patient underwent a left-sided craniotomy and excision of the mass involving her left parietal lobe on 11/10/2011 by Dr. Erline Levine. The pathology report was negative for any malignancy. Pathology report indicated benign brain with fibrosis, hemorrhage, hemosiderin deposition, abundant dystrophic calcifications, and mixed acute and chronic inflammation including foreign body multinucleated giant cells.    Prior Therapy:   1. Kayla [name] underwent surgical resection of her tumor with a colostomy at the time of her surgery on 10/27/2008. The colostomy has been subsequently reversed on 09/20/2010. Kayla [name] received chemotherapy consisting of FOLFOX for 10 treatments from 12/10/2008 through 06/02/2009. She achieved a partial remission from these treatments. She then received additional chemotherapy with 5-FU, leucovorin, and 5-FU by continuous infusion along with Avastin from 06/23/2009 through 11/17/2009. 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  2. See above for her breast cancer treatment history   Current therapy:  adjuvant letrozole 2.1m daily started on 10/17/2015  Interim History:  Kayla [name] for follow-up. She is doing well overall, she has right side shoulder pain and right breast cramps, hot flush is mild to moderate, tolerable. She otherwise doing well, denies any other new pain or discomfort.  Past Medical History:  Diagnosis Date  . Allergy    sulfa  . Arthritis   . Blood  transfusion   . 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 Colon cancer ([facility]    colon/ 2010/surg/chemo  . Diverticulosis 07/22/2010   sigmoid colon  . Family history of breast cancer   . Family history of colon cancer   . GERD (gastroesophageal reflux disease)   . Headache(784.0)   . Heart murmur   . History of radiation therapy 04/15/11   17 Gy single fraction  l parietal brain metastais  . Hypertension   . LVH (left ventricular hypertrophy)    mod/severe. echo 1/11. EF 65-70%   . Neuromuscular disorder ([facility]    peripheral neuropathy feet  . Pericardial effusion    echo 08/13/09  . Peripheral vascular disease ([facility]   . S/P radiation therapy 08/18/15-09/14/15   right breast  . Shortness of breath   . [place]rombocytopenia ([facility]    GYN HISTORY  Menarchal: 14 LMP: 54 Contraceptive: a few years  HRT: no  G2P2:    Medications: I have reviewed the patient's current medications.  Current Outpatient Prescriptions  Medication Sig Dispense Refill  . acetaminophen (TYLENOL) 500 MG tablet Take 1,000  mg by mouth every 6 (six) hours as needed for mild pain, moderate pain or headache. Reported on 09/30/2015    . fluticasone (FLONASE) 50 MCG/ACT nasal spray Place 1 spray into both nostrils daily as needed for allergies. Reported on 11/12/2015    . gabapentin (NEURONTIN) 300 MG capsule TAKE ONE CAPSULE BY MOUTH AT BEDTIME (Patient taking differently: TAKE 300 MG BY MOUTH AT BEDTIME) 90 capsule 0  . letrozole (FEMARA) 2.5 MG tablet TAKE ONE TABLET BY MOUTH DAILY 90 tablet 1  . lisinopril (PRINIVIL,ZESTRIL) 40 MG tablet Take 1 tablet (40 mg total) by mouth daily. 90 tablet 0  . non-metallic deodorant (ALRA) MISC Apply 1 application topically daily as needed. Reported on 11/12/2015    . pravastatin (PRAVACHOL) 20 MG tablet Take 20 mg by mouth daily.      No current facility-administered medications for this visit.    Allergies:  Allergies  Allergen Reactions  . Sulfonamide  Derivatives Rash    Past Medical History, Surgical history, Social history, and Family History were reviewed and updated.  SMOKING HISTORY:  The patient has smoked intermittently, about 2 packs of cigarettes per week, since she was a teenager, but stopped smoking in 2010.  Review of Systems: Constitutional:  Negative for fever, chills, night sweats, anorexia, weight loss, pain. Cardiovascular: no chest pain or dyspnea on exertion Respiratory: no cough, shortness of breath, or wheezing Neurological: no TIA or stroke symptoms Dermatological: negative for rash ENT: negative for - epistaxis, headaches, tinnitus, vertigo or visual changes Skin: Negative. Gastrointestinal: no abdominal pain, change in bowel habits, or black or bloody stools positive for - heartburn Genito-Urinary: no dysuria, trouble voiding, or hematuria Hematological and Lymphatic: negative for - bleeding problems, fatigue, jaundice or pallor Breast: negative for breast lumps Musculoskeletal: positive for - muscular weakness and pins and needles with some numbness (R>L) in lower extremities negative for - gait disturbance Remaining ROS negative.  Physical Exam: Blood pressure 122/74, pulse 85, temperature 98.2 F (36.8 C), temperature source Oral, resp. rate 18, height 5' 4"  (1.626 m), weight 189 lb 6.4 oz (85.9 kg), SpO2 100 %. ECOG: 0 General appearance: alert, cooperative, appears stated age, no distress and mildly obese Head: Normocephalic, without obvious abnormality, atraumatic Neck: no adenopathy, supple, symmetrical, trachea midline and thyroid not enlarged, symmetric, no tenderness/mass/nodules HEENT: PERRLA; EOMi; No sclerae icterus. OP clear of masses.  Lymph nodes: Cervical, supraclavicular, and axillary nodes normal. Heart:regular rate and rhythm, S1, S2 normal, no murmur, click, rub or gallop Lung:chest clear, no wheezing, rales, normal symmetric air entry,  + R sided port-a-cath. Abdomin: soft,  non-tender, without masses or organomegaly, normal bowel sounds and surgical scars well healed. EXT:No peripheral edema.  R>L decreased sensation and mildly decreased strength (noted in prior exams) Neuro: R lower extremity weakness and decreased sensation.  Normal gait.  Good finger to nose bilaterally. Otherwise no focal deficits. Breasts: Breast inspection showed them to be symmetrical with no nipple discharge. The surgical incision in right breast and axilla are well-healed, no tenderness. (+) Diffuse skin pigmentation of the right breast.  Palpation of the breasts and axilla revealed no obvious mass that I could appreciate.    Lab Results: CBC Latest Ref Rng & Units 06/29/2016 03/30/2016 12/23/2015  WBC 3.9 - 10.3 10e3/uL 8.5 8.3 7.0  Hemoglobin 11.6 - 15.9 g/dL 13.8 14.0 14.1  Hematocrit 34.8 - 46.6 % 43.1 43.8 42.0  Platelets 145 - 400 10e3/uL 200 207 211    CMP Latest Ref Rng &  Units 06/29/2016 03/30/2016 12/23/2015  Glucose 70 - 140 mg/dl 103 120 96  BUN 7.0 - 26.0 mg/dL 11.7 18.8 14.7  Creatinine 0.6 - 1.1 mg/dL 0.8 1.0 0.9  Sodium 136 - 145 mEq/L 142 145 142  Potassium 3.5 - 5.1 mEq/L 4.0 3.9 4.1  Chloride 101 - 111 mmol/L - - -  CO2 22 - 29 mEq/L 26 26 27   Calcium 8.4 - 10.4 mg/dL 9.8 9.9 9.8  Total Protein 6.4 - 8.3 g/dL 7.5 7.5 7.3  Total Bilirubin 0.20 - 1.20 mg/dL 0.84 0.90 1.04  Alkaline Phos 40 - 150 U/L 124 128 95  AST 5 - 34 U/L 29 32 22  ALT 0 - 55 U/L 36 51 24   CEA (IN HOUSE-[facility])  Order: [id number] - Reflex for Order [id number]  Status:  Final result Visible to patient:  No (Not Released) Next appt:  09/28/2016 at 10:00 AM in Oncology ([facility] ONLY)    Ref Range & Units 10:17 4moago   CEA ([facility]In House) 0.00 - 5.00 ng/mL 2.48  1.84CM        PATHOLOGY REPORT Diagnosis 04/14/2015 1. Breast, lumpectomy, right - INVASIVE GRADE II DUCTAL CARCINOMA, SPANNING 2.7 CM IN GREATEST DIMENSION. - ASSOCIATED INTERMEDATE GRADE DUCTAL CARCINOMA IN SITU. -  LYMPH/VASCULAR INVASION IS IDENTIFIED. - MARGINS ARE NEGATIVE. - SEE ONCOLOGY TEMPLATE. 2. Lymph node, sentinel, biopsy, right axillary - ONE BENIGN LYMPH NODE WITH NO TUMOR SEEN (0/1). 3. Lymph node, sentinel, biopsy, right axillary - ONE BENIGN LYMPH NODE WITH NO TUMOR SEEN (0/1). 4. Lymph node, sentinel, biopsy, right axillary - ONE BENIGN LYMPH NODE WITH NO TUMOR SEEN (0/1). 5. Lymph node, biopsy, right axillary - ONE BENIGN LYMPH NODE WITH NO TUMOR SEEN (0/1). Microscopic Comment 1. BREAST, INVASIVE TUMOR, WITH LYMPH NODES PRESENT Specimen, including laterality and lymph node sampling (sentinel, non-sentinel): Right partial breast with right sentinel lymph node sampling. Procedure: Right breast lumpectomy with right sentinel lymph node biopsies. Histologic type: Invasive ductal carcinoma. Grade: 2. Tubule formation: 3. Nuclear pleomorphism: 2. Mitotic: 1. Tumor size (gross measurement): 2.7 cm. Margins: Invasive, distance to closest margin: 0.2 cm (anterior margin). In-situ, distance to closest margin: At least 0.4 cm (all margins). Lymphovascular invasion: Yes, lymph/vascular invasion is identified. Ductal carcinoma in situ: Yes. Grade: Intermediate grade. Extensive intraductal component: No. Lobular neoplasia: Not identified. Tumor focality: Unifocal. Treatment effect: Not applicable. Extent of tumor: Tumor confined to breast parenchyma. Skin: Not received. Nipple: Not received. Skeletal muscle: Not received. Lymph nodes Examined: 3 Sentinel. 1 Non-sentinel. 4 Total. Lymph nodes with metastasis: 0. Isolated tumor cells (< 0.2 mm): 0. Micrometastasis: (> 0.2 mm and < 2.0 mm): 0. Macrometastasis: (> 2.0 mm): 0. Extracapsular extension: Not applicable. Breast prognostic profile: Performed on previous case [phone] Estrogen receptor: 100%, positive. Progesterone receptor: 80%, positive. Her-2 neu: 1.30 ratio, negative. Ki-67: 15%. Non-neoplastic breast:  Unremarkable. TNM: pT2, pN0. Comments: As Her-2 neu was previously negative, this will be repeated on representative tumor from the current specimen, and will be reported in an addendum to follow. (RH:ds 04/15/15)  Results: HER2 - NEGATIVE RATIO OF HER2/CEP17 SIGNALS 1.17 AVERAGE HER2 COPY NUMBER PER CELL 2.05   Radiological Studies: 1. MRI of the head with and without IV contrast on 03/23/2011 showed a solitary enhancing mass in the left parietal lobe with surrounding white matter edema, most compatible with solitary metastatic deposit. The mass lesion measured 2.7 x 2.5 cm.  2. PET scan from 03/25/2011 showed resolution of the previously- identified pulmonary  nodules with no residual hypermetabolic activity noted in the neck, chest, abdomen or pelvis to suggest active malignancy. There was a pericardial effusion and some subsegmental atelectasis in the lower lobes. There were also uterine fibroids and a low-density lesion in the anterior spleen that was stable and not associated with hypermetabolic activity.  3. MRI of the head with and without IV contrast showed Stealth protocol utilized to evaluate left parietal mass. The mass measured 3 x 3 x 2.4 cm with marked surrounding vasogenic edema.  4. MRI of the head with and without IV contrast on 04/08/2011 showed postsurgical changes of tumor removal in the left parietal lobe and a postsurgical hematoma with enhancement. There was 3 mm of midline shift. No other metastatic deposits.  5. MRI of the head with and without IV contrast on 06/28/2011 showed a 3 cm area of restricted diffusion lying within the previous area of  left parietal surgical resection for colon cancer. There was enhancement of the peripheral aspect of the cavity, marked restricted diffusion and increasing vasogenic edema. There was concern about the development of a brain abscess.  6. MRI of the head with and without IV contrast on 07/29/2011 showed left parietal postoperative  hematoma was smaller compared with the  prior study. There was no nodular enhancement seen to indicate tumor. No new enhancing lesions were seen. There was significant  improvement in the left parietal edema.  7. Digital screening mammogram was negative on 08/02/2011.  8. An MRI of the head with and without IV contrast on 10/21/2011 showed development of gyriform enhancement circumferentially along the margins of the left parietal postoperative space/hematoma. Marked increase in vasogenic edema was seen. 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 There was further contraction of the hematoma/postoperative space in the left parietal cortical region, now measuring 17 x 19 mm in transverse diameter as opposed to 22 x 21 mm previously.  Left-to-right shift was 2 mm.  9. Nuclear medicine brain PET-CT scan carried out on 10/27/2011 showed hypermetabolic activity in the high left parietal lobe which corresponds to gyriform enhancement on comparison MRI from 10/21/2011. This was felt to be most consistent with recurrent  tumor.  10. Chest x-ray, 2 view from 11/04/2011, showed chronic cardiomegaly. Port-A-Cath was in place. No acute disease was present.  11. MRI of the head with and without IV contrast on 01/27/2012 showed findings that were felt to be consistent with progression of disease. This exam was compared with the MRI of 10/21/2011. It was recognized that the patient underwent surgical resection of the mass on 11/10/2011.  There was felt to be on the present exam residual peripheral enhancement of approximately 35 x 40 x 21 mm with infiltration of the cortex and deep white matter. It was felt that the enhancement had progressed, despite removal of the central focus. There was moderate vasogenic  edema throughout the left hemisphere but slightly decreased from the MRI from 10/21/2011. No new lesions were seen. There was mild atrophy with chronic  microvascular ischemic change, but no midline shift. Major intracranial vascular structures were patent.  12. MRI of the brain with and without IV contrast on 05/04/2012 showed previous left parietal lobe surgery for resection of tumor and radiation necrosis.  No new abnormalities were noted. 13. Chest x-ray, 2 view, on 07/03/2012 showed enlargement of the cardiac silhouette with some lingular scarring, as compared with the chest x-ray from 11/04/2011. 14. 2-D echocardiogram from 07/12/2012 showed left  ventricular ejection fraction of 55-60%.  There was a small to moderate circumferential pericardial effusion with no signs of tamponade.  There was felt to be no significant change from the 2-D echocardiogram dated 08/13/2009. 15. Digital bilateral screening mammogram on 08/06/2012 was negative. 16. 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  There was no progression or new brain metastasis identified. 17. PET scan from 10/16/2012 showed no specific features to suggest metastatic disease.  There was diffuse uptake throughout the thyroid gland, possibly due to hyperthyroidism. 18. MRI of the brain with and without IV contrast on 03/12/2013 showed residual gyriform enhancement surrounding the area of previous surgical resection of a left parietal metastasis with subsequent resection of radionecrosis. The findings likely represent some residual brain injury, but there is no significant progression of radionecrosis and no new lesions seen. 19. MRI of the brain with and without IV contrast on 11/01/2013 showed Stable posttherapy appearance of the brain since May of 2014. No progression or new brain metastasis identified. 20. CT chest,abomen and pelvis with contrast. 1. No evidence of thoracic metastasis.  2. Small prevascular lymph node is similar prior. 3. Stable  pericardial effusion. 4. Interval increase in the volume presacral lymph node in the upper pelvis. This has progressed slowly from 2011. Recommend attention on follow-up versus restaging FDG PET scan. 5. Stable anastomosis in the descending colon. 21. CT chest, abdomen and pelvis with contrast 07/126/2015. 1. No evidence of local colon cancer recurrence or metastasis within the abdomen or pelvis. 2. Retention of stool at the colocolonic anastomosis in the left colon is unchanged comparison exam. No obstructing lesion identified.  22. Mesenteric lymph node is again demonstrated with no increased size. 4. Stable splenic lesion likely representing a complex cysts or hemangioma. 5. Stable small pericardial effusion 23. Brain MRI 11/11/2014: stable and satisfactory posttherapy appearance of the brain since 2014 and 2015. No new brain metastasis identified. 24. CT chest, abdomen and pelvis w contrast 02/02/2015: 1. No new or progressive findings to suggest recurrent colorectal, cancer in the chest, abdomen, or pelvis. 2. 16 mm soft tissue nodule in the medial right breast. Correlation with mammographic history recommended. 3. Fibroid change in the uterus. 4. Stable hypodense lesion in the anterior spleen, compatible with complex cyst or hemangioma. 5. Stable appearance of the trace pericardial effusion. 25. Brain MRI 10/29/2015: Continued stable and satisfactory posttherapy appearance of the brain. No new intracranial abnormality. 26. 02/02/2016 Bilateral diagnostic mammogram showed lumpectomy change in the right breast, no evidence of malignancy.   Impression and Plan: 65 year old African-American female, postmenopausal  1. Right breat invasive ductal carcinoma, pT2N0M0, stage IIA, ER+/PR+, HER2-, Oncotype RS 30 -I reviewed her surgical pathology findings with her in great details. -I reviewed her Oncotype DX test results. The recurrence score is 30, which predicts 10 year risk of distant recurrence 20% with  tamoxifen alone. This is considered intermediate risk, but close to high risk group. -She received 4 cycles of adjuvant chemotherapy TC -She now has completed adjuvant breast radiation -she is tolerating adjuvant letrozole very well, will continue for a total of 5-10 years  -We'll continue breast cancer surveillance, including annual mammogram, self exam, and routine follow-up -She is clinically doing well, screening mammogram in July was negative, clinical exam are unremarkable, her lab results reviewed with her, normal CBC and CMP, no evidence of recurrence -she has mild right  shoulder discomfort, possibly related to letrozole, I encouraged her to exercise regularly.  2.  HTN -continue medication  -continue monitoring BP at home   3. Stage IV Colon Cancer mets to lung and brain, NED --Kayla [name] continues to do well with no evidence for disease recurrence.  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   -She is clinically doing very well. Her CEA level has been normal, today's result is still pending  - her last brain MRI in 10/2015 was negative for recurrenc -her last restaging CT scans in 01/2015 showed no evidence of recurrence - she is clinically doing well, physical exam is unremarkable,  CEA has been normal, we'll continue observation. - she is 5 years out of her brain recurrence, follow up with lab CBC, CMP and CEA every 3 months. I do not plan to do surveillance CT scan  4. Peripheral neuropathy, grade 1, secondary to prior chemotherapy  -stable  5 Genetics -Given her positive family and personal history of breast (Mother at age of 28 and cousin) and colon cancer (cousin), she was referred to genetic counselor -Her genetic test was negative  6. Bone health  -She has not had a bone density scan for several years, I'll schedule one for her -We discussed that letrozole may weaken her bone -I encouraged  her to exercise regularly, and take calcium and vitamin D  She will continue follow-up with her primary care physician for other medical problems  Plan -continue letrozole  -Bone density scan in the next month -I will see her back in 3 months   I spent 25 minutes counseling the patient face to face. The total time spent in the appointment was 30 minutes. She knows to call us if she has any concern or issues before her next appointment.    Truitt Merle  06/29/2016

## 2016-06-30 DIAGNOSIS — G620 Drug-induced polyneuropathy: Secondary | ICD-10-CM | POA: Diagnosis not present

## 2016-06-30 DIAGNOSIS — Z6832 Body mass index (BMI) 32.0-32.9, adult: Secondary | ICD-10-CM | POA: Diagnosis not present

## 2016-06-30 DIAGNOSIS — Z713 Dietary counseling and surveillance: Secondary | ICD-10-CM | POA: Diagnosis not present

## 2016-06-30 DIAGNOSIS — E782 Mixed hyperlipidemia: Secondary | ICD-10-CM | POA: Diagnosis not present

## 2016-06-30 DIAGNOSIS — I10 Essential (primary) hypertension: Secondary | ICD-10-CM | POA: Diagnosis not present

## 2016-06-30 DIAGNOSIS — Z23 Encounter for immunization: Secondary | ICD-10-CM | POA: Diagnosis not present

## 2016-06-30 DIAGNOSIS — J302 Other seasonal allergic rhinitis: Secondary | ICD-10-CM | POA: Diagnosis not present

## 2016-06-30 LAB — CEA: CEA: 2.6 ng/mL (ref 0.0–4.7)

## 2016-07-05 ENCOUNTER — Telehealth: Payer: Self-pay | Admitting: Hematology

## 2016-07-05 NOTE — Telephone Encounter (Signed)
Mailed records to [facility]/ risk adjustment

## 2016-07-19 DIAGNOSIS — Z853 Personal history of malignant neoplasm of breast: Secondary | ICD-10-CM | POA: Diagnosis not present

## 2016-09-21 NOTE — Progress Notes (Signed)
Hematology and Oncology Follow Up Visit Date of Visit: 09/28/2016   JACEE ENERSON [id number] 1950-12-07 66 y.o. 09/28/2016   PCP: [place] [id number])  Principle Diagnosis:  1. Colon cancer, T4N1M1, Stage IV, diagnosed on 10/27/2008, brain recurrence in 03/2011  2. Stage IIA right breast cancer, diagnosed in 02/2015  Oncology History   Breast cancer of upper-inner quadrant of right female breast [facility])   Staging form: Breast, AJCC 7th Edition     Clinical: Stage IIA (T2, N0, M0) - Unsigned     Pathologic stage from 04/14/2015: Stage IIA (T2, N0, cM0) - Signed by Truitt Merle, MD on 05/05/2015 Cancer of left colon [facility])   Staging form: Colon and Rectum, AJCC 7th Edition     Pathologic: T4a, N1a, M1 - Unsigned        Breast cancer of upper-inner quadrant of right female breast ([place])   02/27/2015 Mammogram    Diagnostic mammogram showed a 2.5 cm irregular mass within the far posterior upper inner right breast, ultrasound confirmed a 1.9 x 1.0 x 0.8 cm mass at 1:30 o'clock 15 submitted from nipple. No axillary adenopathy      03/03/2015 Initial Biopsy    Right breast needle biopsy showed invasive ductal carcinoma, grade 1-2.      03/03/2015 Receptors her2    ER 100%+, PR 80%+, ki67 15%, HER2/neu negative      03/03/2015 Clinical Stage    Stage IIA: T2 N0      03/18/2015 Procedure    VUS at POLD1 gene called c.327G>C. neg at APC, ATM, AXIN2, BARD1, BMPR1A, BRCA1, BRCA2, BRIP1, CDH1, CDK4, CDKN2A, CHEK2, EPCAM, FANCC, MLH1, MSH2, MSH6, MUTYH, NBN, PALB2, PMS2, POLD1, POLE, PTEN, RAD51C, RAD51D, SCG5/GREM1, SMAD4, STK11, TP53, VHL,XRCC2      04/14/2015 Surgery    Right breast lumpectomy and sentinel lymph node biopsy. Surgical margins were negative.      04/14/2015 Pathology Results    right breast invasive ductal carcinoma, grade 2, 2.7 cm,(+) DCIS, margins were negative, 4 sentinel lymph nodes and one axillary lymph nodes were negative, (+)lymphovascular  invasion.      04/14/2015 Pathologic Stage    Stage IIA: T2 N0      04/14/2015 Oncotype testing    RS 30, which predicts 10-year risk of distance recurrence with tamoxifen alone 20%, intermediate risk      05/21/2015 - 07/24/2015 Adjuvant Chemotherapy     Adjuvant chemotherapy with docetaxel 75 mg/m, and Cytoxan 600 mg/m X 4 cycles      05/26/2015 - 05/29/2015 Hospital Admission    Pt was admitted for UTI and syncope, treated with antibiotics and IVF       08/17/2015 - 09/13/2015 Radiation Therapy    Adjuvant RT: Right breast treated to 42.5 Gy in 17 fractions, Right breast boost treated to 7.5 Gy in 3 fractions      10/14/2015 -  Anti-estrogen oral therapy    Letrozole 2.5 mg daily      11/13/2015 Survivorship    Survivorship visit completed      02/02/2016 Mammogram    02/02/2016 Bilateral diagnostic mammogram showed lumpectomy change in the right breast, no evidence of malignancy.      Oncology History: Diagnosis of colon cancer dates back to 10/27/2008 when [name] presented with what turned out to be a bowel perforation through tumor. Stage at that time was T4 N1 M1 with pulmonary metastatic disease. The K-ras mutation was detected. Keli underwent surgical resection of her tumor with a colostomy  at the time of her surgery on 10/27/2008. The colostomy has been subsequently reversed on 09/20/2010. Gracin received chemotherapy consisting of FOLFOX for 10 treatments from 12/10/2008 through 06/02/2009. She achieved a partial remission from these treatments. She then received additional chemotherapy with 5-FU, leucovorin, and 5-FU by continuous infusion along with Avastin from 06/23/2009 through 11/17/2009. 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 She was doing well without evidence of disease until she developed a right hemiparesthesia in September 2012 and was found to have a 3 x 3 x 2.4 cm, lobulated, enhancing mass in the left parietal lobe with  marked surrounding edema. A PET scan on 03/25/2011 showed resolution of the previously identified pulmonary nodules with no residual hypermetabolic activity. The pericardial effusion, which has been present since diagnosis, was unchanged. There were also uterine fibroids and a low-density lesion in the anterior spleen that was stable and not associated with any hypermetabolic activity. Of note, Herta had a markedly elevated CEA up to 24.7 on 03/23/2011. On 06/23/2011, the CEA was less than 0.5. Dr. Erline Levine resected the recurrent metastatic colon cancer on 03/31/2011. This was followed by stereotactic radiation on 04/15/2011. The patient underwent a left-sided craniotomy and excision of the mass involving her left parietal lobe on 11/10/2011 by Dr. Erline Levine. The pathology report was negative for any malignancy. Pathology report indicated benign brain with fibrosis, hemorrhage, hemosiderin deposition, abundant dystrophic calcifications, and mixed acute and chronic inflammation including foreign body multinucleated giant cells.    Prior Therapy:   1. [name] underwent surgical resection of her tumor with a colostomy at the time of her surgery on 10/27/2008. The colostomy has been subsequently reversed on 09/20/2010. Felesha received chemotherapy consisting of FOLFOX for 10 treatments from 12/10/2008 through 06/02/2009. She achieved a partial remission from these treatments. She then received additional chemotherapy with 5-FU, leucovorin, and 5-FU by continuous infusion along with Avastin from 06/23/2009 through 11/17/2009. 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  2. See above for her breast cancer treatment history   Current therapy:  Adjuvant letrozole 2.'5mg'$  daily started on 10/17/2015  Interim History: BRILEIGH SEVCIK returns for follow-up with her husband. She reports doing "alright." She reports hot flashes that started after taking Letrozole. She reports they happen  all of the time. She states they are tolerable. She always has joint pain, particularly in the knees. States the joint pain is not worse after taking Letrozole. She has joint stiffness that comes and goes.   Past Medical History:  Diagnosis Date  . Allergy    sulfa  . Arthritis   . Blood transfusion   . 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 Colon cancer ([place])    colon/ 2010/surg/chemo  . Diverticulosis 07/22/2010   sigmoid colon  . Family history of breast cancer   . Family history of colon cancer   . GERD (gastroesophageal reflux disease)   . Headache(784.0)   . Heart murmur   . History of radiation therapy 04/15/11   17 Gy single fraction  l parietal brain metastais  . Hypertension   . LVH (left ventricular hypertrophy)    mod/severe. echo 1/11. EF 65-70%   . Neuromuscular disorder ([place])    peripheral neuropathy feet  . Pericardial effusion    echo 08/13/09  . Peripheral vascular disease ([place])   . S/P radiation therapy 08/18/15-09/14/15   right breast  . Shortness of breath   .  Thrombocytopenia ([place])    GYN HISTORY  Menarchal: 14 LMP: 54 Contraceptive: a few years  HRT: no  G2P2:    Medications: I have reviewed the patient's current medications.  Current Outpatient Prescriptions  Medication Sig Dispense Refill  . acetaminophen (TYLENOL) 500 MG tablet Take 1,000 mg by mouth every 6 (six) hours as needed for mild pain, moderate pain or headache. Reported on 09/30/2015    . fluticasone (FLONASE) 50 MCG/ACT nasal spray Place 1 spray into both nostrils daily as needed for allergies. Reported on 11/12/2015    . gabapentin (NEURONTIN) 300 MG capsule TAKE ONE CAPSULE BY MOUTH AT BEDTIME (Patient taking differently: TAKE 300 MG BY MOUTH AT BEDTIME) 90 capsule 0  . letrozole (FEMARA) 2.5 MG tablet Take 1 tablet (2.5 mg total) by mouth daily. 90 tablet 1  . lisinopril (PRINIVIL,ZESTRIL) 40 MG tablet Take 1 tablet (40 mg total) by mouth daily. 90 tablet 0  .  non-metallic deodorant (ALRA) MISC Apply 1 application topically daily as needed. Reported on 11/12/2015    . pravastatin (PRAVACHOL) 20 MG tablet Take 20 mg by mouth daily.      No current facility-administered medications for this visit.    Allergies:  Allergies  Allergen Reactions  . Sulfonamide Derivatives Rash    Past Medical History, Surgical history, Social history, and Family History were reviewed and updated.  SMOKING HISTORY:  The patient has smoked intermittently, about 2 packs of cigarettes per week, since she was a teenager, but stopped smoking in 2010.  Review of Systems: Constitutional:  Negative for fever, chills, anorexia, weight loss, pain. (+) Hot flashes Cardiovascular: no chest pain or dyspnea on exertion Respiratory: no cough, shortness of breath, or wheezing Neurological: no TIA or stroke symptoms Dermatological: negative for rash ENT: negative for - epistaxis, headaches, tinnitus, vertigo or visual changes Skin: Negative. Gastrointestinal: no abdominal pain, change in bowel habits, or black or bloody stools positive for - heartburn Genito-Urinary: no dysuria, trouble voiding, or hematuria Hematological and Lymphatic: negative for - bleeding problems, fatigue, jaundice or pallor Breast: negative for breast lumps Musculoskeletal: (+) Joint stiffness and pain.  positive for - muscular weakness and pins and needles with some numbness (R>L) in lower extremities negative for - gait disturbance Remaining ROS negative.  Physical Exam: Blood pressure 139/81, pulse 89, temperature 98.9 F (37.2 C), temperature source Oral, resp. rate 18, height _0  (1.626 m), weight 191 lb 6.4 oz (86.8 kg), SpO2 98 %. ECOG: 0 General appearance: alert, cooperative, appears stated age, no distress and mildly obese Head: Normocephalic, without obvious abnormality, atraumatic Neck: no adenopathy, supple, symmetrical, trachea midline and thyroid not enlarged, symmetric, no  tenderness/mass/nodules HEENT: PERRLA; EOMi; No sclerae icterus. OP clear of masses.  Lymph nodes: Cervical, supraclavicular, and axillary nodes normal. Heart:regular rate and rhythm, S1, S2 normal, no murmur, click, rub or gallop Lung:chest clear, no wheezing, rales, normal symmetric air entry,  + R sided port-a-cath. Abdomin: soft, non-tender, without masses or organomegaly, normal bowel sounds and surgical scars well healed. EXT:No peripheral edema.  R>L decreased sensation and mildly decreased strength (noted in prior exams) Neuro: R lower extremity weakness and decreased sensation.  Normal gait.  Good finger to nose bilaterally. Otherwise no focal deficits. Breasts: Breast inspection showed them to be symmetrical with no nipple discharge. The surgical incision in right breast and axilla are well-healed, no tenderness. (+) Mild skin pigmentation of the right breast.  Palpation of the breasts and axilla revealed no obvious mass that I  could appreciate.   Lab Results: CBC Latest Ref Rng & Units 09/28/2016 06/29/2016 03/30/2016  WBC 3.9 - 10.3 10e3/uL 7.0 8.5 8.3  Hemoglobin 11.6 - 15.9 g/dL 14.5 13.8 14.0  Hematocrit 34.8 - 46.6 % 43.9 43.1 43.8  Platelets 145 - 400 10e3/uL 215 200 207    CMP Latest Ref Rng & Units 09/28/2016 06/29/2016 03/30/2016  Glucose 70 - 140 mg/dl 130 103 120  BUN 7.0 - 26.0 mg/dL 16.9 11.7 18.8  Creatinine 0.6 - 1.1 mg/dL 0.9 0.8 1.0  Sodium 136 - 145 mEq/L 142 142 145  Potassium 3.5 - 5.1 mEq/L 4.1 4.0 3.9  Chloride 101 - 111 mmol/L - - -  CO2 22 - 29 mEq/L _0 Calcium 8.4 - 10.4 mg/dL 10.4 9.8 9.9  Total Protein 6.4 - 8.3 g/dL 7.6 7.5 7.5  Total Bilirubin 0.20 - 1.20 mg/dL 0.62 0.84 0.90  Alkaline Phos 40 - 150 U/L 126 124 128  AST 5 - 34 U/L 27 29 32  ALT 0 - 55 U/L 33 36 51   Results for AILY, TZENG (MRN [id number]) as of 09/28/2016 11:31  Ref. Range 12/23/2015 11:20 03/30/2016 09:56 06/29/2016 10:17 09/28/2016 09:49  CEA Latest Units: ng/mL 1.1      CEA Latest Ref Range: 0.0 - 4.7 ng/mL 2.8 2.3 2.6   CEA ([facility]) Latest Ref Range: 0.00 - 5.00 ng/mL  1.84 2.48 1.83    PATHOLOGY REPORT Diagnosis 04/14/2015 1. Breast, lumpectomy, right - INVASIVE GRADE II DUCTAL CARCINOMA, SPANNING 2.7 CM IN GREATEST DIMENSION. - ASSOCIATED INTERMEDATE GRADE DUCTAL CARCINOMA IN SITU. - LYMPH/VASCULAR INVASION IS IDENTIFIED. - MARGINS ARE NEGATIVE. - SEE ONCOLOGY TEMPLATE. 2. Lymph node, sentinel, biopsy, right axillary - ONE BENIGN LYMPH NODE WITH NO TUMOR SEEN (0/1). 3. Lymph node, sentinel, biopsy, right axillary - ONE BENIGN LYMPH NODE WITH NO TUMOR SEEN (0/1). 4. Lymph node, sentinel, biopsy, right axillary - ONE BENIGN LYMPH NODE WITH NO TUMOR SEEN (0/1). 5. Lymph node, biopsy, right axillary - ONE BENIGN LYMPH NODE WITH NO TUMOR SEEN (0/1). Microscopic Comment 1. BREAST, INVASIVE TUMOR, WITH LYMPH NODES PRESENT Specimen, including laterality and lymph node sampling (sentinel, non-sentinel): Right partial breast with right sentinel lymph node sampling. Procedure: Right breast lumpectomy with right sentinel lymph node biopsies. Histologic type: Invasive ductal carcinoma. Grade: 2. Tubule formation: 3. Nuclear pleomorphism: 2. Mitotic: 1. Tumor size (gross measurement): 2.7 cm. Margins: Invasive, distance to closest margin: 0.2 cm (anterior margin). In-situ, distance to closest margin: At least 0.4 cm (all margins). Lymphovascular invasion: Yes, lymph/vascular invasion is identified. Ductal carcinoma in situ: Yes. Grade: Intermediate grade. Extensive intraductal component: No. Lobular neoplasia: Not identified. Tumor focality: Unifocal. Treatment effect: Not applicable. Extent of tumor: Tumor confined to breast parenchyma. Skin: Not received. Nipple: Not received. Skeletal muscle: Not received. Lymph nodes Examined: 3 Sentinel. 1 Non-sentinel. 4 Total. Lymph nodes with metastasis: 0. Isolated tumor cells (< 0.2 mm):  0. Micrometastasis: (> 0.2 mm and < 2.0 mm): 0. Macrometastasis: (> 2.0 mm): 0. Extracapsular extension: Not applicable. Breast prognostic profile: Performed on previous case [phone]: Estrogen receptor: 100%, positive. Progesterone receptor: 80%, positive. Her-2 neu: 1.30 ratio, negative. Ki-67: 15%. Non-neoplastic breast: Unremarkable. TNM: pT2, pN0. Comments: As Her-2 neu was previously negative, this will be repeated on representative tumor from the current specimen, and will be reported in an addendum to follow. (RH:ds 04/15/15)  Results: HER2 - NEGATIVE RATIO OF HER2/CEP17 SIGNALS 1.17 AVERAGE HER2 COPY NUMBER PER CELL 2.05  PROCEDURES COLONOSCOPY 11/20/2013 ENDOSCOPIC IMPRESSION: 1. Diminutive sessile polyp was found in the transverse colon; polypectomy was performed with cold forceps 2. There was evidence of a prior colo-colonic surgical anastomosis in the left colon 3. The colon mucosa was otherwise normal - excellent prep RECOMMENDATIONS: Repeat Colonoscopy in 5 years.  Radiological Studies: 1. MRI of the head with and without IV contrast on 03/23/2011 showed a solitary enhancing mass in the left parietal lobe with surrounding white matter edema, most compatible with solitary metastatic deposit. The mass lesion measured 2.7 x 2.5 cm.  2. PET scan from 03/25/2011 showed resolution of the previously- identified pulmonary nodules with no residual hypermetabolic activity noted in the neck, chest, abdomen or pelvis to suggest active malignancy. There was a pericardial effusion and some subsegmental atelectasis in the lower lobes. There were also uterine fibroids and a low-density lesion in the anterior spleen that was stable and not associated with hypermetabolic activity.  3. MRI of the head with and without IV contrast showed Stealth protocol utilized to evaluate left parietal mass. The mass measured 3 x 3 x 2.4 cm with marked surrounding vasogenic edema.  4. MRI of the head  with and without IV contrast on 04/08/2011 showed postsurgical changes of tumor removal in the left parietal lobe and a postsurgical hematoma with enhancement. There was 3 mm of midline shift. No other metastatic deposits.  5. MRI of the head with and without IV contrast on 06/28/2011 showed a 3 cm area of restricted diffusion lying within the previous area of  left parietal surgical resection for colon cancer. There was enhancement of the peripheral aspect of the cavity, marked restricted diffusion and increasing vasogenic edema. There was concern about the development of a brain abscess.  6. MRI of the head with and without IV contrast on 07/29/2011 showed left parietal postoperative hematoma was smaller compared with the  prior study. There was no nodular enhancement seen to indicate tumor. No new enhancing lesions were seen. There was significant  improvement in the left parietal edema.  7. Digital screening mammogram was negative on 08/02/2011.  8. An MRI of the head with and without IV contrast on 10/21/2011 showed development of gyriform enhancement circumferentially along the margins of the left parietal postoperative space/hematoma. Marked increase in vasogenic edema was seen. 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 There was further contraction of the hematoma/postoperative space in the left parietal cortical region, now measuring 17 x 19 mm in transverse diameter as opposed to 22 x 21 mm previously.  Left-to-right shift was 2 mm.  9. Nuclear medicine brain PET-CT scan carried out on 10/27/2011 showed hypermetabolic activity in the high left parietal lobe which corresponds to gyriform enhancement on comparison MRI from 10/21/2011. This was felt to be most consistent with recurrent  tumor.  10. Chest x-ray, 2 view from 11/04/2011, showed chronic cardiomegaly. Port-A-Cath was in place. No acute disease was present.  11. MRI of the head  with and without IV contrast on 01/27/2012 showed findings that were felt to be consistent with progression of disease. This exam was compared with the MRI of 10/21/2011. It was recognized that the patient underwent surgical resection of the mass on 11/10/2011.  There was felt to be on the present exam residual peripheral enhancement of approximately 35 x 40 x 21 mm with infiltration of the cortex and deep white matter. It was felt that the enhancement had progressed, despite removal of the  central focus. There was moderate vasogenic  edema throughout the left hemisphere but slightly decreased from the MRI from 10/21/2011. No new lesions were seen. There was mild atrophy with chronic microvascular ischemic change, but no midline shift. Major intracranial vascular structures were patent.  12. MRI of the brain with and without IV contrast on 05/04/2012 showed previous left parietal lobe surgery for resection of tumor and radiation necrosis.  No new abnormalities were noted. 13. Chest x-ray, 2 view, on 07/03/2012 showed enlargement of the cardiac silhouette with some lingular scarring, as compared with the chest x-ray from 11/04/2011. 14. 2-D echocardiogram from 07/12/2012 showed left ventricular ejection fraction of 55-60%.  There was a small to moderate circumferential pericardial effusion with no signs of tamponade.  There was felt to be no significant change from the 2-D echocardiogram dated 08/13/2009. 15. Digital bilateral screening mammogram on 08/06/2012 was negative. 16. 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  There was no progression or new brain metastasis identified. 17. PET scan from 10/16/2012 showed no specific features to suggest metastatic disease.  There was diffuse uptake throughout the thyroid gland, possibly due to hyperthyroidism. 18.  MRI of the brain with and without IV contrast on 03/12/2013 showed residual gyriform enhancement surrounding the area of previous surgical resection of a left parietal metastasis with subsequent resection of radionecrosis. The findings likely represent some residual brain injury, but there is no significant progression of radionecrosis and no new lesions seen. 19. MRI of the brain with and without IV contrast on 11/01/2013 showed Stable posttherapy appearance of the brain since May of 2014. No progression or new brain metastasis identified. 20. CT chest,abomen and pelvis with contrast. 1. No evidence of thoracic metastasis.  2. Small prevascular lymph node is similar prior. 3. Stable pericardial effusion. 4. Interval increase in the volume presacral lymph node in the upper pelvis. This has progressed slowly from 2011. Recommend attention on follow-up versus restaging FDG PET scan. 5. Stable anastomosis in the descending colon. 21. CT chest, abdomen and pelvis with contrast 07/126/2015. 1. No evidence of local colon cancer recurrence or metastasis within the abdomen or pelvis. 2. Retention of stool at the colocolonic anastomosis in the left colon is unchanged comparison exam. No obstructing lesion identified.  22. Mesenteric lymph node is again demonstrated with no increased size. 4. Stable splenic lesion likely representing a complex cysts or hemangioma. 5. Stable small pericardial effusion 23. Brain MRI 11/11/2014: stable and satisfactory posttherapy appearance of the brain since 2014 and 2015. No new brain metastasis identified. 24. CT chest, abdomen and pelvis w contrast 02/02/2015: 1. No new or progressive findings to suggest recurrent colorectal, cancer in the chest, abdomen, or pelvis. 2. 16 mm soft tissue nodule in the medial right breast. Correlation with mammographic history recommended. 3. Fibroid change in the uterus. 4. Stable hypodense lesion in the anterior spleen, compatible with complex cyst or  hemangioma. 5. Stable appearance of the trace pericardial effusion. 25. Brain MRI 10/29/2015: Continued stable and satisfactory posttherapy appearance of the brain. No new intracranial abnormality. 26. 02/02/2016 Bilateral diagnostic mammogram showed lumpectomy change in the right breast, no evidence of malignancy.  Impression and Plan: 66 y.o. African-American female, postmenopausal  1. Right breat invasive ductal carcinoma, pT2N0M0, stage IIA, ER+/PR+, HER2-, Oncotype RS 30 -I previously reviewed her surgical pathology findings with her in great details. -I reviewed her  Oncotype DX test results. The recurrence score is 30, which predicts 10 year risk of distant recurrence 20% with tamoxifen alone. This is considered intermediate risk, but close to high risk group. -She received 4 cycles of adjuvant chemotherapy TC -She now has completed adjuvant breast radiation -she is tolerating adjuvant letrozole very well, will continue for a total of 5-10 years  -We'll continue breast cancer surveillance, including annual mammogram, self exam, and routine follow-up -She is clinically doing well, screening mammogram in July 2017 was negative, clinical exam are unremarkable, her lab results reviewed with her, normal CBC and CMP, no evidence of recurrence -she has hot flashes after starting letrozole, they are tolerable. She also has joint pain that was present before starting Letrozole that has not worsened. -Repeat screening mammogram in July 2018.  2.  HTN -continue medication  -continue monitoring BP at home   3. Stage IV Colon Cancer mets to lung and brain, NED --Stephenie continues to do well with no evidence for disease recurrence.  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   -She is clinically doing very well. Her CEA level has been normal, today's result is still pending  - Brain MRI in 10/2015 was negative for  recurrence. Brain MRI in 04/2016 was negative for recurrence. -her last restaging CT scans in 01/2015 showed no evidence of recurrence - she is clinically doing well, physical exam is unremarkable,  CEA has been normal, we'll continue observation. - she is 5 years out of her brain recurrence, follow up with lab CBC, CMP and CEA every 3 months. I do not plan to do surveillance CT scan -Continue follow up with Dr. Lisbeth Renshaw  4. Peripheral neuropathy, grade 1, secondary to prior chemotherapy  -stable  5 Genetics -Given her positive family and personal history of breast (Mother at age of 37 and cousin) and colon cancer (cousin), she was referred to genetic counselor -Her genetic test was negative  6. Bone health  -She has not had a bone density scan for several years. -We discussed that letrozole may weaken her bone -I encouraged her to exercise regularly, and take calcium and vitamin D -Bone scan in 1-2 months.  She will continue follow-up with her primary care physician for other medical problems  Plan -continue letrozole, refilled today. -Lab and f/u in 4 months -Bone density scan in 1-2 months. -Mammogram in July 2018. -Brain MRI and follow up with Dr. Lisbeth Renshaw in April 2018.  I spent 25 minutes counseling the patient face to face. The total time spent in the appointment was 30 minutes. She knows to call us if she has any concern or issues before her next appointment.    Truitt Merle  09/28/2016   This document serves as a record of services personally performed by Truitt Merle, MD. It was created on her behalf by Darcus Austin, a trained medical scribe. The creation of this record is based on the scribe's personal observations and the provider's statements to them. This document has been checked and approved by the attending provider.

## 2016-09-28 ENCOUNTER — Ambulatory Visit (HOSPITAL_BASED_OUTPATIENT_CLINIC_OR_DEPARTMENT_OTHER): Payer: Medicare HMO | Admitting: Hematology

## 2016-09-28 ENCOUNTER — Encounter: Payer: Self-pay | Admitting: Hematology

## 2016-09-28 ENCOUNTER — Other Ambulatory Visit (HOSPITAL_BASED_OUTPATIENT_CLINIC_OR_DEPARTMENT_OTHER): Payer: Medicare HMO

## 2016-09-28 VITALS — BP 139/81 | HR 89 | Temp 98.9°F | Resp 18 | Ht 64.0 in | Wt 191.4 lb

## 2016-09-28 DIAGNOSIS — G620 Drug-induced polyneuropathy: Secondary | ICD-10-CM | POA: Diagnosis not present

## 2016-09-28 DIAGNOSIS — C50211 Malignant neoplasm of upper-inner quadrant of right female breast: Secondary | ICD-10-CM | POA: Diagnosis not present

## 2016-09-28 DIAGNOSIS — Z85038 Personal history of other malignant neoplasm of large intestine: Secondary | ICD-10-CM

## 2016-09-28 DIAGNOSIS — Z170 Estrogen receptor positive status [ER+]: Secondary | ICD-10-CM | POA: Diagnosis not present

## 2016-09-28 DIAGNOSIS — C186 Malignant neoplasm of descending colon: Secondary | ICD-10-CM

## 2016-09-28 DIAGNOSIS — E2839 Other primary ovarian failure: Secondary | ICD-10-CM

## 2016-09-28 DIAGNOSIS — I10 Essential (primary) hypertension: Secondary | ICD-10-CM | POA: Diagnosis not present

## 2016-09-28 LAB — CBC WITH DIFFERENTIAL/PLATELET
BASO%: 0.4 % (ref 0.0–2.0)
Basophils Absolute: 0 10*3/uL (ref 0.0–0.1)
EOS ABS: 0.2 10*3/uL (ref 0.0–0.5)
EOS%: 2.4 % (ref 0.0–7.0)
HEMATOCRIT: 43.9 % (ref 34.8–46.6)
HGB: 14.5 g/dL (ref 11.6–15.9)
LYMPH%: 23.3 % (ref 14.0–49.7)
MCH: 27.7 pg (ref 25.1–34.0)
MCHC: 33 g/dL (ref 31.5–36.0)
MCV: 83.9 fL (ref 79.5–101.0)
MONO#: 0.5 10*3/uL (ref 0.1–0.9)
MONO%: 7.5 % (ref 0.0–14.0)
NEUT%: 66.4 % (ref 38.4–76.8)
NEUTROS ABS: 4.6 10*3/uL (ref 1.5–6.5)
NRBC: 0 % (ref 0–0)
PLATELETS: 215 10*3/uL (ref 145–400)
RBC: 5.23 10*6/uL (ref 3.70–5.45)
RDW: 14.3 % (ref 11.2–14.5)
WBC: 7 10*3/uL (ref 3.9–10.3)
lymph#: 1.6 10*3/uL (ref 0.9–3.3)

## 2016-09-28 LAB — COMPREHENSIVE METABOLIC PANEL
ALT: 33 U/L (ref 0–55)
ANION GAP: 11 meq/L (ref 3–11)
AST: 27 U/L (ref 5–34)
Albumin: 3.8 g/dL (ref 3.5–5.0)
Alkaline Phosphatase: 126 U/L (ref 40–150)
BUN: 16.9 mg/dL (ref 7.0–26.0)
CALCIUM: 10.4 mg/dL (ref 8.4–10.4)
CO2: 27 mEq/L (ref 22–29)
CREATININE: 0.9 mg/dL (ref 0.6–1.1)
Chloride: 104 mEq/L (ref 98–109)
EGFR: 84 mL/min/{1.73_m2} — ABNORMAL LOW (ref >90)
Glucose: 130 mg/dl (ref 70–140)
Potassium: 4.1 mEq/L (ref 3.5–5.1)
Sodium: 142 mEq/L (ref 136–145)
TOTAL PROTEIN: 7.6 g/dL (ref 6.4–8.3)
Total Bilirubin: 0.62 mg/dL (ref 0.20–1.20)

## 2016-09-28 LAB — CEA (IN HOUSE-CHCC): CEA (CHCC-IN HOUSE): 1.83 ng/mL (ref 0.00–5.00)

## 2016-09-28 MED ORDER — LETROZOLE 2.5 MG PO TABS: 2.5000 mg | ORAL_TABLET | Freq: Every day | ORAL | 1 refills | Status: DC

## 2016-10-07 ENCOUNTER — Telehealth: Payer: Self-pay | Admitting: Hematology

## 2016-10-07 NOTE — Telephone Encounter (Signed)
Faxed records to [facility] risk adjustment °

## 2016-10-19 ENCOUNTER — Ambulatory Visit
Admission: RE | Admit: 2016-10-19 | Discharge: 2016-10-19 | Disposition: A | Discharge status: Home / Self Care | Payer: Medicare HMO | Source: Ambulatory Visit | Attending: Hematology | Admitting: Hematology

## 2016-10-19 DIAGNOSIS — E2839 Other primary ovarian failure: Secondary | ICD-10-CM

## 2016-10-19 DIAGNOSIS — Z780 Asymptomatic menopausal state: Secondary | ICD-10-CM | POA: Diagnosis not present

## 2016-10-19 DIAGNOSIS — Z13820 Encounter for screening for osteoporosis: Secondary | ICD-10-CM | POA: Diagnosis not present

## 2016-10-26 ENCOUNTER — Encounter: Payer: Self-pay | Admitting: Radiation Therapy

## 2016-10-26 NOTE — Progress Notes (Signed)
[name] MRI was cancelled due to her insurance not approving the scan. The reason behind their refusal is that it has been more than a year since she has been treated to the brain. They are not willing to approve a surveillance scan unless she is experiencing symptoms. I spoke with Kita about this. She said that she is not having any symptoms at this time. We will still have her follow-up visit with Shona Simpson Monday 4/16, but the MRI has been cancelled.      If there is a change, and she develops a headache or other CNS symptom, we will reschedule the scan.            Mont Dutton R.T.(R)(T) Special Procedures Navigator

## 2016-10-26 NOTE — Progress Notes (Addendum)
Kayla [name] 66 y.o. woman with Right-sided breast cancer completed radiation 08-24-15, review MRI brain w wo contrast 6 month  FU.   SRS brain  Back in 2012 with Dr.Manning  Skin status:Right breast with normal color. What lotion are you using? None Have you seen med onc? If not, when is appointment:09-28-16 Dr Burr Medico If they are ER+, have they started Al or Tamoxifen? If not, why? Letrozole Discuss survivorship appointment:11-12-15 [name] Have you had a mammogram scheduled? Scheduled 02-03-17 Dr. Burr Medico Appetite:Good Pain:None Fatigue:Denies having fatigue. Arm mobility:  Able to raise right arm without difficulty.                                                  Lymphedema:None NEURO: Pt alert & oriented x 3 with fluent speech, reports stiffness of extremities. Pt denies visual blurring, double vision, LA, ringing in bilateral ears, c/o feeling like she was under water. Pt presenting appropriate quality, quantity and organization of sentences. Pt denies headaches. Wt Readings from Last 3 Encounters:  10/31/16 191 lb (86.6 kg)  09/28/16 191 lb 6.4 oz (86.8 kg)  06/29/16 189 lb 6.4 oz (85.9 kg)  BP 125/63   Pulse (!) 104   Temp 97.9 F (36.6 C) (Oral)   Resp 18   Ht '5\' 4"'$  (1.626 m)   Wt 191 lb (86.6 kg)   SpO2 99%   BMI 32.79 kg/m

## 2016-10-27 ENCOUNTER — Other Ambulatory Visit: Payer: Medicare HMO

## 2016-10-31 ENCOUNTER — Ambulatory Visit
Admission: RE | Admit: 2016-10-31 | Discharge: 2016-10-31 | Disposition: A | Discharge status: Home / Self Care | Payer: Medicare HMO | Source: Ambulatory Visit | Attending: Radiation Oncology | Admitting: Radiation Oncology

## 2016-10-31 ENCOUNTER — Encounter: Payer: Self-pay | Admitting: Radiation Oncology

## 2016-10-31 VITALS — BP 125/63 | HR 104 | Temp 97.9°F | Resp 18 | Ht 64.0 in | Wt 191.0 lb

## 2016-10-31 DIAGNOSIS — Z85038 Personal history of other malignant neoplasm of large intestine: Secondary | ICD-10-CM

## 2016-10-31 DIAGNOSIS — R42 Dizziness and giddiness: Secondary | ICD-10-CM | POA: Insufficient documentation

## 2016-10-31 DIAGNOSIS — C7931 Secondary malignant neoplasm of brain: Secondary | ICD-10-CM | POA: Insufficient documentation

## 2016-10-31 DIAGNOSIS — Z882 Allergy status to sulfonamides status: Secondary | ICD-10-CM | POA: Insufficient documentation

## 2016-10-31 DIAGNOSIS — Z853 Personal history of malignant neoplasm of breast: Secondary | ICD-10-CM | POA: Diagnosis not present

## 2016-10-31 DIAGNOSIS — Z85841 Personal history of malignant neoplasm of brain: Secondary | ICD-10-CM | POA: Diagnosis not present

## 2016-10-31 DIAGNOSIS — Z9889 Other specified postprocedural states: Secondary | ICD-10-CM | POA: Diagnosis not present

## 2016-10-31 DIAGNOSIS — C189 Malignant neoplasm of colon, unspecified: Secondary | ICD-10-CM | POA: Insufficient documentation

## 2016-10-31 DIAGNOSIS — C50211 Malignant neoplasm of upper-inner quadrant of right female breast: Secondary | ICD-10-CM | POA: Diagnosis not present

## 2016-10-31 DIAGNOSIS — Z170 Estrogen receptor positive status [ER+]: Secondary | ICD-10-CM | POA: Insufficient documentation

## 2016-10-31 DIAGNOSIS — Z08 Encounter for follow-up examination after completed treatment for malignant neoplasm: Secondary | ICD-10-CM | POA: Diagnosis not present

## 2016-10-31 NOTE — Progress Notes (Signed)
Radiation Oncology         [id number]) [phone] ________________________________  Name: Kayla [name] MRN: [id number]  Date: 10/31/2016  DOB: 07-Sep-1950  Follow-Up Visit Note  CC: Kayla Fairy, PA-C  [name], Latia, DO  Diagnosis: Breast cancer of upper-inner quadrant of right female breast (Kayla [name])   Staging form: Breast, AJCC 7th Edition     Clinical: Stage IIA (T2, N0, M0) - Unsigned     Pathologic stage from 04/14/2015: Stage IIA (T2, N0, cM0) - Signed by Kayla Merle, MD on 05/05/2015 Cancer of left colon Kayla [name])   Staging form: Colon and Rectum, AJCC 7th Edition     Pathologic: T4a, N1a, M1 - Unsigned  Interval Since Last Radiation: 2 months  08/17/15 - 08/24/15: Right breast treated to 42.5 Gy in 17 fractions, Right breast boost treated to 7.5 Gy in 3 fractions  04/15/11: 17 Gy to left parietal target in 1 fraction  Narrative:  The patient returns today for routine follow-up. She continues on antiestrogen therapy for management of her breast cancer, and has been followed in surveillance for her history of colon cancer.   On review of systems, the patient reports that she is doing well overall. She denies any chest pain, shortness of breath, cough, fevers, chills, night sweats, unintended weight changes. She denies any bowel or bladder disturbances, and denies abdominal pain. She denies any new musculoskeletal or joint aches or pains, new skin lesions or concerns. She denies headaches, nausea, vomiting, gait disturbances or imbalance however she has had some episodes of dizziness and feels as though she's underwater. She noticed this more so in her right ear and as a result has had some changes in her auditory acuity. A complete review of systems is obtained and is otherwise negative.   ALLERGIES:  is allergic to sulfonamide derivatives.  Meds: Current Outpatient Prescriptions  Medication Sig Dispense Refill  . acetaminophen (TYLENOL) 500 MG tablet Take 1,000 mg by mouth every 6 (six) hours as  needed for mild pain, moderate pain or headache. Reported on 09/30/2015    . fluticasone (FLONASE) 50 MCG/ACT nasal spray Place 1 spray into both nostrils daily as needed for allergies. Reported on 11/12/2015    . gabapentin (NEURONTIN) 300 MG capsule TAKE ONE CAPSULE BY MOUTH AT BEDTIME (Patient taking differently: TAKE 300 MG BY MOUTH AT BEDTIME) 90 capsule 0  . letrozole (FEMARA) 2.5 MG tablet Take 1 tablet (2.5 mg total) by mouth daily. 90 tablet 1  . lisinopril (PRINIVIL,ZESTRIL) 40 MG tablet Take 1 tablet (40 mg total) by mouth daily. 90 tablet 0  . pravastatin (PRAVACHOL) 20 MG tablet Take 20 mg by mouth daily.      No current facility-administered medications for this encounter.     Physical Findings: height is '5\' 4"'$  (1.626 m) and weight is 191 lb (86.6 kg). Her oral temperature is 97.9 F (36.6 C). Her blood pressure is 125/63 and her pulse is 104 (abnormal). Her respiration is 18 and oxygen saturation is 99%. .   In general this is a well appearing African American female in no acute distress. She's alert and oriented x4 and appropriate throughout the examination. Cardiopulmonary assessment is negative for acute distress and she exhibits normal effort. Evaluation of her ears bilaterally reveal normal-appearing external auditory canals however bilateral eardrums do have serous fluid posteriorly on the right bulging is noted without purulent changes. No erythema is noted. However bubbles are noted in the fluid.   Lab Findings: Lab Results  Component  Value Date   WBC 7.0 09/28/2016   HGB 14.5 09/28/2016   HCT 43.9 09/28/2016   MCV 83.9 09/28/2016   PLT 215 09/28/2016     Radiographic Findings: Dg Bone Density  Result Date: 10/19/2016 EXAM: DUAL X-RAY ABSORPTIOMETRY (DXA) FOR BONE MINERAL DENSITY IMPRESSION: Referring Physician:  Truitt [name] PATIENT: Name: Kayla [name] Patient ID: [id number] Birth Date: 08/25/1950 Height: 64.0 in. Sex: Female Measured: 10/19/2016 Weight: 189.2 lbs.  Indications: Breast Cancer History, Estrogen Deficient, Letrozole, Postmenopausal Fractures: None Treatments: Calcium (E943.0), Hormone Therapy For Cancer, Vitamin D (E933.5) ASSESSMENT: The BMD measured at Femur Neck Left is 0.987 g/cm2 with a T-score of -0.4. This patient is considered NORMAL according to [name]) criteria. L-3  was excluded due to degenerative changes. Site Region Measured Date Measured Age YA BMD Significant CHANGE T-score DualFemur Neck Left  10[age]19/2016    63.7         -0.4    0.987 g/cm2 AP Spine  L1-L4 (L3) 10[age]19/2016    63.7         0.7     1.268 g/cm2 World Health Organization [facility]) criteria for post-menopausal, Caucasian Women: Normal       T-score at or above -1 SD Osteopenia   T-score between -1 and -2.5 SD Osteoporosis T-score at or below -2.5 SD RECOMMENDATION: [place] recommends that FDA-approved medical therapies be considered in postmenopausal women and men age 63 or older with a: 1. Hip or vertebral (clinical or morphometric) fracture. 2. T-score of <-2.5 at the spine or hip. 3. Ten-year fracture probability by FRAX of 3% or greater for hip fracture or 20% or greater for major osteoporotic fracture. All treatment decisions require clinical judgment and consideration of individual patient factors, including patient preferences, co-morbidities, previous drug use, risk factors not captured in the FRAX model (e.g. falls, vitamin D deficiency, increased bone turnover, interval significant decline in bone density) and possible under - or over-estimation of fracture risk by FRAX. All patients should ensure an adequate intake of dietary calcium (1200 mg/d) and vitamin D (800 IU daily) unless contraindicated. FOLLOW-UP: People with diagnosed cases of osteoporosis or at high risk for fracture should have regular bone mineral density tests. For patients eligible for Medicare, routine testing is allowed once every 2 years. The testing frequency  can be increased to one year for patients who have rapidly progressing disease, those who are receiving or discontinuing medical therapy to restore bone mass, or have additional risk factors. I have reviewed this report, and agree with the above findings. Mark A. Thornton Papas, M.D. [name] Radiology Electronically Signed   By: Lavonia Dana M.D.   On: 10/19/2016 14:24    Impression/Plan: 1. 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 The patient appears to be doing well overall, she is not having any neurologic symptoms, but does experience symptoms of what sounds to be eustachian tube dysfunction and dizziness as a result. Her insurance will not cover the cost of screening imaging despite her previous brain treatment. If she develops any symptoms, we will further reassess this clinically. She states agreement and understanding and will also continue to follow up with Dr. Burr Medico in medical oncology  2. 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 The patient has done well and will continue with Dr. Burr Medico. We will be happy to discuss her previous breast treatment as needed. She states agreement and understanding. 3. Possible  eustachian tube dysfunction. We discussed the use of over-the-counter decongestants to help aid in this. If this is not improved, and she is having symptoms of dizziness, we will pursue a diagnostic image of the brain. She states agreement and understanding.

## 2016-10-31 NOTE — Addendum Note (Signed)
Encounter addended by: Malena Edman, RN on: 10/31/2016  3:34 PM<BR>    Actions taken: Charge Capture section accepted

## 2016-11-02 DIAGNOSIS — G620 Drug-induced polyneuropathy: Secondary | ICD-10-CM | POA: Diagnosis not present

## 2016-11-02 DIAGNOSIS — E782 Mixed hyperlipidemia: Secondary | ICD-10-CM | POA: Diagnosis not present

## 2016-11-02 DIAGNOSIS — I10 Essential (primary) hypertension: Secondary | ICD-10-CM | POA: Diagnosis not present

## 2016-11-02 DIAGNOSIS — J302 Other seasonal allergic rhinitis: Secondary | ICD-10-CM | POA: Diagnosis not present

## 2016-11-02 DIAGNOSIS — Z713 Dietary counseling and surveillance: Secondary | ICD-10-CM | POA: Diagnosis not present

## 2016-11-02 DIAGNOSIS — Z6832 Body mass index (BMI) 32.0-32.9, adult: Secondary | ICD-10-CM | POA: Diagnosis not present

## 2016-11-08 DIAGNOSIS — Z6832 Body mass index (BMI) 32.0-32.9, adult: Secondary | ICD-10-CM | POA: Diagnosis not present

## 2016-11-08 DIAGNOSIS — H7391 Unspecified disorder of tympanic membrane, right ear: Secondary | ICD-10-CM | POA: Diagnosis not present

## 2016-11-08 DIAGNOSIS — I10 Essential (primary) hypertension: Secondary | ICD-10-CM | POA: Diagnosis not present

## 2016-11-08 DIAGNOSIS — Z87448 Personal history of other diseases of urinary system: Secondary | ICD-10-CM | POA: Diagnosis not present

## 2016-11-08 DIAGNOSIS — J309 Allergic rhinitis, unspecified: Secondary | ICD-10-CM | POA: Diagnosis not present

## 2016-11-08 DIAGNOSIS — G620 Drug-induced polyneuropathy: Secondary | ICD-10-CM | POA: Diagnosis not present

## 2016-11-08 DIAGNOSIS — Z0000 Encounter for general adult medical examination without abnormal findings: Secondary | ICD-10-CM | POA: Diagnosis not present

## 2016-11-08 DIAGNOSIS — Z7982 Long term (current) use of aspirin: Secondary | ICD-10-CM | POA: Diagnosis not present

## 2016-11-08 DIAGNOSIS — C7931 Secondary malignant neoplasm of brain: Secondary | ICD-10-CM | POA: Diagnosis not present

## 2016-11-08 DIAGNOSIS — Z79899 Other long term (current) drug therapy: Secondary | ICD-10-CM | POA: Diagnosis not present

## 2016-11-08 DIAGNOSIS — Z7951 Long term (current) use of inhaled steroids: Secondary | ICD-10-CM | POA: Diagnosis not present

## 2016-11-08 DIAGNOSIS — Z9221 Personal history of antineoplastic chemotherapy: Secondary | ICD-10-CM | POA: Diagnosis not present

## 2016-11-08 DIAGNOSIS — Z79811 Long term (current) use of aromatase inhibitors: Secondary | ICD-10-CM | POA: Diagnosis not present

## 2016-11-08 DIAGNOSIS — C50919 Malignant neoplasm of unspecified site of unspecified female breast: Secondary | ICD-10-CM | POA: Diagnosis not present

## 2016-11-08 DIAGNOSIS — Z85038 Personal history of other malignant neoplasm of large intestine: Secondary | ICD-10-CM | POA: Diagnosis not present

## 2016-11-08 DIAGNOSIS — K08409 Partial loss of teeth, unspecified cause, unspecified class: Secondary | ICD-10-CM | POA: Diagnosis not present

## 2016-11-08 DIAGNOSIS — E7800 Pure hypercholesterolemia, unspecified: Secondary | ICD-10-CM | POA: Diagnosis not present

## 2016-11-08 DIAGNOSIS — Z87891 Personal history of nicotine dependence: Secondary | ICD-10-CM | POA: Diagnosis not present

## 2017-01-26 NOTE — Progress Notes (Signed)
Hematology and Oncology Follow Up Visit Date of Visit: 01/27/2017   Kayla [name] [id number] Jul 23, 1950 66 y.o. 01/27/2017   PCP: [facility] [id number])  Principle Diagnosis:  1. Colon cancer, T4N1M1, Stage IV, diagnosed on 10/27/2008, brain recurrence in 03/2011  2. Stage IIA right breast cancer, diagnosed in 02/2015  Oncology History   Breast cancer of upper-inner quadrant of right female breast [facility])   Staging form: Breast, AJCC 7th Edition     Clinical: Stage IIA (T2, N0, M0) - Unsigned     Pathologic stage from 04/14/2015: Stage IIA (T2, N0, cM0) - Signed by Truitt Merle, MD on 05/05/2015 Cancer of left colon [facility])   Staging form: Colon and Rectum, AJCC 7th Edition     Pathologic: T4a, N1a, M1 - Unsigned        Breast cancer of upper-inner quadrant of right female breast ([place])   02/27/2015 Mammogram    Diagnostic mammogram showed a 2.5 cm irregular mass within the far posterior upper inner right breast, ultrasound confirmed a 1.9 x 1.0 x 0.8 cm mass at 1:30 o'clock 15 submitted from nipple. No axillary adenopathy      03/03/2015 Initial Biopsy    Right breast needle biopsy showed invasive ductal carcinoma, grade 1-2.      03/03/2015 Receptors her2    ER 100%+, PR 80%+, ki67 15%, HER2/neu negative      03/03/2015 Clinical Stage    Stage IIA: T2 N0      03/18/2015 Procedure    VUS at POLD1 gene called c.327G>C. neg at APC, ATM, AXIN2, BARD1, BMPR1A, BRCA1, BRCA2, BRIP1, CDH1, CDK4, CDKN2A, CHEK2, EPCAM, FANCC, MLH1, MSH2, MSH6, MUTYH, NBN, PALB2, PMS2, POLD1, POLE, PTEN, RAD51C, RAD51D, SCG5/GREM1, SMAD4, STK11, TP53, VHL,XRCC2      04/14/2015 Surgery    Right breast lumpectomy and sentinel lymph node biopsy. Surgical margins were negative.      04/14/2015 Pathology Results    right breast invasive ductal carcinoma, grade 2, 2.7 cm,(+) DCIS, margins were negative, 4 sentinel lymph nodes and one axillary lymph nodes were negative, (+)lymphovascular  invasion.      04/14/2015 Pathologic Stage    Stage IIA: T2 N0      04/14/2015 Oncotype testing    RS 30, which predicts 10-year risk of distance recurrence with tamoxifen alone 20%, intermediate risk      05/21/2015 - 07/24/2015 Adjuvant Chemotherapy     Adjuvant chemotherapy with docetaxel 75 mg/m, and Cytoxan 600 mg/m X 4 cycles      05/26/2015 - 05/29/2015 Hospital Admission    Pt was admitted for UTI and syncope, treated with antibiotics and IVF       08/17/2015 - 09/13/2015 Radiation Therapy    Adjuvant RT: Right breast treated to 42.5 Gy in 17 fractions, Right breast boost treated to 7.5 Gy in 3 fractions      10/14/2015 -  Anti-estrogen oral therapy    Letrozole 2.5 mg daily      11/13/2015 Survivorship    Survivorship visit completed      02/02/2016 Mammogram    02/02/2016 Bilateral diagnostic mammogram showed lumpectomy change in the right breast, no evidence of malignancy.      Oncology History: Diagnosis of colon cancer dates back to 10/27/2008 when Kayla [name] presented with what turned out to be a bowel perforation through tumor. Stage at that time was T4 N1 M1 with pulmonary metastatic disease. The K-ras mutation was detected. Kaylina underwent surgical resection of her tumor with a colostomy  at the time of her surgery on 10/27/2008. The colostomy has been subsequently reversed on 09/20/2010. Kayla [name] received chemotherapy consisting of FOLFOX for 10 treatments from 12/10/2008 through 06/02/2009. She achieved a partial remission from these treatments. She then received additional chemotherapy with 5-FU, leucovorin, and 5-FU by continuous infusion along with Avastin from 06/23/2009 through 11/17/2009. 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 She was doing well without evidence of disease until she developed a right hemiparesthesia in September 2012 and was found to have a 3 x 3 x 2.4 cm, lobulated, enhancing mass in the left parietal lobe with  marked surrounding edema. A PET scan on 03/25/2011 showed resolution of the previously identified pulmonary nodules with no residual hypermetabolic activity. The pericardial effusion, which has been present since diagnosis, was unchanged. There were also uterine fibroids and a low-density lesion in the anterior spleen that was stable and not associated with any hypermetabolic activity. Of note, Kayla [name] had a markedly elevated CEA up to 24.7 on 03/23/2011. On 06/23/2011, the CEA was less than 0.5. Dr. Erline Levine resected the recurrent metastatic colon cancer on 03/31/2011. This was followed by stereotactic radiation on 04/15/2011. The patient underwent a left-sided craniotomy and excision of the mass involving her left parietal lobe on 11/10/2011 by Dr. Erline Levine. The pathology report was negative for any malignancy. Pathology report indicated benign brain with fibrosis, hemorrhage, hemosiderin deposition, abundant dystrophic calcifications, and mixed acute and chronic inflammation including foreign body multinucleated giant cells.    Prior Therapy:   1. Kayla [name] underwent surgical resection of her tumor with a colostomy at the time of her surgery on 10/27/2008. The colostomy has been subsequently reversed on 09/20/2010. Kayla [name] received chemotherapy consisting of FOLFOX for 10 treatments from 12/10/2008 through 06/02/2009. She achieved a partial remission from these treatments. She then received additional chemotherapy with 5-FU, leucovorin, and 5-FU by continuous infusion along with Avastin from 06/23/2009 through 11/17/2009. 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  2. See above for her breast cancer treatment history   Current therapy:  Adjuvant letrozole 2.28m daily started on 10/17/2015  Interim History:  Kayla LEIPHARTreturns for follow-up with her husband. She reports that does not sleep well on letrozole. She does have hot flash and some joint pain but all tolerable.  Her hair is growing back but has a spot of baldness. Overall she is dong well     Past Medical History:  Diagnosis Date  . Allergy    sulfa  . Arthritis   . Blood transfusion   . 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 Colon cancer ([facility]    colon/ 2010/surg/chemo  . Diverticulosis 07/22/2010   sigmoid colon  . Family history of breast cancer   . Family history of colon cancer   . GERD (gastroesophageal reflux disease)   . Headache(784.0)   . Heart murmur   . History of radiation therapy 04/15/11   17 Gy single fraction  l parietal brain metastais  . Hypertension   . LVH (left ventricular hypertrophy)    mod/severe. echo 1/11. EF 65-70%   . Neuromuscular disorder ([facility]    peripheral neuropathy feet  . Pericardial effusion    echo 08/13/09  . Peripheral vascular disease ([facility]   . S/P radiation therapy 08/18/15-09/14/15   right breast  . Shortness of breath   . Thrombocytopenia ([facility]    GYN HISTORY  Menarchal: 14 LMP: 54 Contraceptive:  a few years  HRT: no  G2P2:    Medications: I have reviewed the patient's current medications.  Current Outpatient Prescriptions  Medication Sig Dispense Refill  . acetaminophen (TYLENOL) 500 MG tablet Take 1,000 mg by mouth every 6 (six) hours as needed for mild pain, moderate pain or headache. Reported on 09/30/2015    . fluticasone (FLONASE) 50 MCG/ACT nasal spray Place 1 spray into both nostrils daily as needed for allergies. Reported on 11/12/2015    . gabapentin (NEURONTIN) 300 MG capsule TAKE ONE CAPSULE BY MOUTH AT BEDTIME (Patient taking differently: TAKE 300 MG BY MOUTH AT BEDTIME) 90 capsule 0  . letrozole (FEMARA) 2.5 MG tablet Take 1 tablet (2.5 mg total) by mouth daily. 90 tablet 1  . lisinopril (PRINIVIL,ZESTRIL) 40 MG tablet Take 1 tablet (40 mg total) by mouth daily. 90 tablet 0  . pravastatin (PRAVACHOL) 20 MG tablet Take 20 mg by mouth daily.      No current facility-administered medications for this  visit.    Allergies:  Allergies  Allergen Reactions  . Sulfonamide Derivatives Rash    Past Medical History, Surgical history, Social history, and Family History were reviewed and updated.  SMOKING HISTORY:  The patient has smoked intermittently, about 2 packs of cigarettes per week, since she was a teenager, but stopped smoking in 2010.  Review of Systems:  Constitutional:  Negative for fever, chills, anorexia, weight loss, pain. (+) Hot flashes (+) hair growing back slow Cardiovascular: no chest pain or dyspnea on exertion Respiratory: no cough, shortness of breath, or wheezing  Neurological: no TIA or stroke symptoms Dermatological: negative for rash ENT: negative for - epistaxis, headaches, tinnitus, vertigo or visual changes Skin: Negative. Gastrointestinal: no abdominal pain, change in bowel habits, or black or bloody stools positive for - heartburn Genito-Urinary: no dysuria, trouble voiding, or hematuria Hematological and Lymphatic: negative for - bleeding problems, fatigue, jaundice or pallor Breast: negative for breast lumps Musculoskeletal: (+) Joint stiffness and pain.  positive for - muscular weakness and pins and needles with some numbness (R>L) in lower extremities negative for - gait disturbance Remaining ROS negative.  Physical Exam: Blood pressure (!) 147/74, pulse 79, temperature 98 F (36.7 C), temperature source Oral, resp. rate 18, height 5' 4"  (1.626 m), weight 189 lb 11.2 oz (86 kg), SpO2 97 %. ECOG: 0  General appearance: alert, cooperative, appears stated age, no distress and mildly obese Head: Normocephalic, without obvious abnormality, atraumatic Neck: no adenopathy, supple, symmetrical, trachea midline and thyroid not enlarged, symmetric, no tenderness/mass/nodules HEENT: PERRLA; EOMi; No sclerae icterus. OP clear of masses.  Lymph nodes: Cervical, supraclavicular, and axillary nodes normal. Heart:regular rate and rhythm, S1, S2 normal, no murmur,  click, rub or gallop Lung:chest clear, no wheezing, rales, normal symmetric air entry,  + R sided port-a-cath. Abdomin: soft, non-tender, without masses or organomegaly, normal bowel sounds and surgical scars well healed. EXT:No peripheral edema.  R>L decreased sensation and mildly decreased strength (noted in prior exams) Neuro: R lower extremity weakness and decreased sensation.  Normal gait.  Good finger to nose bilaterally. Otherwise no focal deficits. Breasts: Breast inspection showed them to be symmetrical with no nipple discharge. The surgical incision in right breast and axilla are well-healed, no tenderness. (+) Mild skin pigmentation of the right breast. (+) palpable 2.5 x 4 cm mass in UIQ of right breast underneath surgical incision, possible fluid collection    Lab Results: CBC Latest Ref Rng & Units 01/27/2017 09/28/2016 06/29/2016  WBC 3.9 -  10.3 10e3/uL 8.7 7.0 8.5  Hemoglobin 11.6 - 15.9 g/dL 14.0 14.5 13.8  Hematocrit 34.8 - 46.6 % 42.9 43.9 43.1  Platelets 145 - 400 10e3/uL 194 215 200    CMP Latest Ref Rng & Units 01/27/2017 09/28/2016 06/29/2016  Glucose 70 - 140 mg/dl 93 130 103  BUN 7.0 - 26.0 mg/dL 12.8 16.9 11.7  Creatinine 0.6 - 1.1 mg/dL 0.9 0.9 0.8  Sodium 136 - 145 mEq/L 142 142 142  Potassium 3.5 - 5.1 mEq/L 4.6 4.1 4.0  Chloride 101 - 111 mmol/L - - -  CO2 22 - 29 mEq/L 28 27 26   Calcium 8.4 - 10.4 mg/dL 10.6(H) 10.4 9.8  Total Protein 6.4 - 8.3 g/dL 7.4 7.6 7.5  Total Bilirubin 0.20 - 1.20 mg/dL 0.86 0.62 0.84  Alkaline Phos 40 - 150 U/L 122 126 124  AST 5 - 34 U/L 29 27 29   ALT 0 - 55 U/L 32 33 36    Results for OLETHA, TOLSON (MRN [id number]) as of 01/27/2017 14:48  Ref. Range 06/29/2016 10:17 09/28/2016 09:49 01/27/2017 12:08  CEA Latest Ref Range: 0.0 - 4.7 ng/mL 2.6    CEA ([facility]) Latest Ref Range: 0.00 - 5.00 ng/mL 2.48 1.83 1.94    PATHOLOGY REPORT Diagnosis 04/14/2015 1. Breast, lumpectomy, right - INVASIVE GRADE II DUCTAL CARCINOMA,  SPANNING 2.7 CM IN GREATEST DIMENSION. - ASSOCIATED INTERMEDATE GRADE DUCTAL CARCINOMA IN SITU. - LYMPH/VASCULAR INVASION IS IDENTIFIED. - MARGINS ARE NEGATIVE. - SEE ONCOLOGY TEMPLATE. 2. Lymph node, sentinel, biopsy, right axillary - ONE BENIGN LYMPH NODE WITH NO TUMOR SEEN (0/1). 3. Lymph node, sentinel, biopsy, right axillary - ONE BENIGN LYMPH NODE WITH NO TUMOR SEEN (0/1). 4. Lymph node, sentinel, biopsy, right axillary - ONE BENIGN LYMPH NODE WITH NO TUMOR SEEN (0/1). 5. Lymph node, biopsy, right axillary - ONE BENIGN LYMPH NODE WITH NO TUMOR SEEN (0/1). Microscopic Comment 1. BREAST, INVASIVE TUMOR, WITH LYMPH NODES PRESENT Specimen, including laterality and lymph node sampling (sentinel, non-sentinel): Right partial breast with right sentinel lymph node sampling. Procedure: Right breast lumpectomy with right sentinel lymph node biopsies. Histologic type: Invasive ductal carcinoma. Grade: 2. Tubule formation: 3. Nuclear pleomorphism: 2. Mitotic: 1. Tumor size (gross measurement): 2.7 cm. Margins: Invasive, distance to closest margin: 0.2 cm (anterior margin). In-situ, distance to closest margin: At least 0.4 cm (all margins). Lymphovascular invasion: Yes, lymph/vascular invasion is identified. Ductal carcinoma in situ: Yes. Grade: Intermediate grade. Extensive intraductal component: No. Lobular neoplasia: Not identified. Tumor focality: Unifocal. Treatment effect: Not applicable. Extent of tumor: Tumor confined to breast parenchyma. Skin: Not received. Nipple: Not received. Skeletal muscle: Not received. Lymph nodes Examined: 3 Sentinel. 1 Non-sentinel. 4 Total. Lymph nodes with metastasis: 0. Isolated tumor cells (< 0.2 mm): 0. Micrometastasis: (> 0.2 mm and < 2.0 mm): 0. Macrometastasis: (> 2.0 mm): 0. Extracapsular extension: Not applicable. Breast prognostic profile: Performed on previous case [phone]: Estrogen receptor: 100%, positive. Progesterone  receptor: 80%, positive. Her-2 neu: 1.30 ratio, negative. Ki-67: 15%. Non-neoplastic breast: Unremarkable. TNM: pT2, pN0. Comments: As Her-2 neu was previously negative, this will be repeated on representative tumor from the current specimen, and will be reported in an addendum to follow. (RH:ds 04/15/15) Results: HER2 - NEGATIVE RATIO OF HER2/CEP17 SIGNALS 1.17 AVERAGE HER2 COPY NUMBER PER CELL 2.05  PROCEDURES COLONOSCOPY 11/20/2013 ENDOSCOPIC IMPRESSION: 1. Diminutive sessile polyp was found in the transverse colon; polypectomy was performed with cold forceps 2. There was evidence of a prior colo-colonic surgical anastomosis in  the left colon 3. The colon mucosa was otherwise normal - excellent prep RECOMMENDATIONS: Repeat Colonoscopy in 5 years.  Radiological Studies:  Bone Density 10/19/16 ASSESSMENT: The BMD measured at Femur Neck Left is 0.987 g/cm2 with a T-score of -0.4.   1. MRI of the head with and without IV contrast on 03/23/2011 showed a solitary enhancing mass in the left parietal lobe with surrounding white matter edema, most compatible with solitary metastatic deposit. The mass lesion measured 2.7 x 2.5 cm.  2. PET scan from 03/25/2011 showed resolution of the previously- identified pulmonary nodules with no residual hypermetabolic activity noted in the neck, chest, abdomen or pelvis to suggest active malignancy. There was a pericardial effusion and some subsegmental atelectasis in the lower lobes. There were also uterine fibroids and a low-density lesion in the anterior spleen that was stable and not associated with hypermetabolic activity.  3. MRI of the head with and without IV contrast showed Stealth protocol utilized to evaluate left parietal mass. The mass measured 3 x 3 x 2.4 cm with marked surrounding vasogenic edema.  4. MRI of the head with and without IV contrast on 04/08/2011 showed postsurgical changes of tumor removal in the left parietal lobe and a  postsurgical hematoma with enhancement. There was 3 mm of midline shift. No other metastatic deposits.  5. MRI of the head with and without IV contrast on 06/28/2011 showed a 3 cm area of restricted diffusion lying within the previous area of  left parietal surgical resection for colon cancer. There was enhancement of the peripheral aspect of the cavity, marked restricted diffusion and increasing vasogenic edema. There was concern about the development of a brain abscess.  6. MRI of the head with and without IV contrast on 07/29/2011 showed left parietal postoperative hematoma was smaller compared with the  prior study. There was no nodular enhancement seen to indicate tumor. No new enhancing lesions were seen. There was significant  improvement in the left parietal edema.  7. Digital screening mammogram was negative on 08/02/2011.  8. An MRI of the head with and without IV contrast on 10/21/2011 showed development of gyriform enhancement circumferentially along the margins of the left parietal postoperative space/hematoma. Marked increase in vasogenic edema was seen. 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 There was further contraction of the hematoma/postoperative space in the left parietal cortical region, now measuring 17 x 19 mm in transverse diameter as opposed to 22 x 21 mm previously.  Left-to-right shift was 2 mm.  9. Nuclear medicine brain PET-CT scan carried out on 10/27/2011 showed hypermetabolic activity in the high left parietal lobe which corresponds to gyriform enhancement on comparison MRI from 10/21/2011. This was felt to be most consistent with recurrent  tumor.  10. Chest x-ray, 2 view from 11/04/2011, showed chronic cardiomegaly. Port-A-Cath was in place. No acute disease was present.  11. MRI of the head with and without IV contrast on 01/27/2012 showed findings that were felt to be consistent with progression of disease.  This exam was compared with the MRI of 10/21/2011. It was recognized that the patient underwent surgical resection of the mass on 11/10/2011.  There was felt to be on the present exam residual peripheral enhancement of approximately 35 x 40 x 21 mm with infiltration of the cortex and deep white matter. It was felt that the enhancement had progressed, despite removal of the central focus. There was moderate vasogenic  edema throughout the  left hemisphere but slightly decreased from the MRI from 10/21/2011. No new lesions were seen. There was mild atrophy with chronic microvascular ischemic change, but no midline shift. Major intracranial vascular structures were patent.  12. MRI of the brain with and without IV contrast on 05/04/2012 showed previous left parietal lobe surgery for resection of tumor and radiation necrosis.  No new abnormalities were noted. 13. Chest x-ray, 2 view, on 07/03/2012 showed enlargement of the cardiac silhouette with some lingular scarring, as compared with the chest x-ray from 11/04/2011. 14. 2-D echocardiogram from 07/12/2012 showed left ventricular ejection fraction of 55-60%.  There was a small to moderate circumferential pericardial effusion with no signs of tamponade.  There was felt to be no significant change from the 2-D echocardiogram dated 08/13/2009. 15. Digital bilateral screening mammogram on 08/06/2012 was negative. 16. 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  There was no progression or new brain metastasis identified. 17. PET scan from 10/16/2012 showed no specific features to suggest metastatic disease.  There was diffuse uptake throughout the thyroid gland, possibly due to hyperthyroidism. 18. MRI of the brain with and without IV contrast on 03/12/2013 showed residual gyriform enhancement surrounding the area of  previous surgical resection of a left parietal metastasis with subsequent resection of radionecrosis. The findings likely represent some residual brain injury, but there is no significant progression of radionecrosis and no new lesions seen. 19. MRI of the brain with and without IV contrast on 11/01/2013 showed Stable posttherapy appearance of the brain since May of 2014. No progression or new brain metastasis identified. 20. CT chest,abomen and pelvis with contrast. 1. No evidence of thoracic metastasis.  2. Small prevascular lymph node is similar prior. 3. Stable pericardial effusion. 4. Interval increase in the volume presacral lymph node in the upper pelvis. This has progressed slowly from 2011. Recommend attention on follow-up versus restaging FDG PET scan. 5. Stable anastomosis in the descending colon. 21. CT chest, abdomen and pelvis with contrast 07/126/2015. 1. No evidence of local colon cancer recurrence or metastasis within the abdomen or pelvis. 2. Retention of stool at the colocolonic anastomosis in the left colon is unchanged comparison exam. No obstructing lesion identified.  22. Mesenteric lymph node is again demonstrated with no increased size. 4. Stable splenic lesion likely representing a complex cysts or hemangioma. 5. Stable small pericardial effusion 23. Brain MRI 11/11/2014: stable and satisfactory posttherapy appearance of the brain since 2014 and 2015. No new brain metastasis identified. 24. CT chest, abdomen and pelvis w contrast 02/02/2015: 1. No new or progressive findings to suggest recurrent colorectal, cancer in the chest, abdomen, or pelvis. 2. 16 mm soft tissue nodule in the medial right breast. Correlation with mammographic history recommended. 3. Fibroid change in the uterus. 4. Stable hypodense lesion in the anterior spleen, compatible with complex cyst or hemangioma. 5. Stable appearance of the trace pericardial effusion. 25. Brain MRI 10/29/2015: Continued stable and  satisfactory posttherapy appearance of the brain. No new intracranial abnormality. 26. 02/02/2016 Bilateral diagnostic mammogram showed lumpectomy change in the right breast, no evidence of malignancy.  Impression and Plan: 66 y.o. African-American female, postmenopausal  1. Right breat invasive ductal carcinoma, pT2N0M0, stage IIA, ER+/PR+, HER2-, Oncotype RS 30 -I previously reviewed her surgical pathology findings with her in great details. -I reviewed her Oncotype DX test results. The recurrence score is 30, which  predicts 10 year risk of distant recurrence 20% with tamoxifen alone. This is considered intermediate risk, but close to high risk group. -She received 4 cycles of adjuvant chemotherapy TC -She now has completed adjuvant breast radiation -she is tolerating adjuvant letrozole very well, will continue for a total of 5-10 years  -We'll continue breast cancer surveillance, including annual mammogram, self exam, and routine follow-up -She is clinically doing well, screening mammogram in July 2017 was negative. -she has hot flashes after starting letrozole, they are tolerable. She also has joint pain that was present before starting Letrozole that has not worsened. -clinical exam is unremarkable, her lab results reviewed with her, mostly normal CBC and CMP, no evidence of recurrence.  -Next Mammogram in 02/03/17 -f/u in 4 months    2.  HTN -continue medication  -continue monitoring BP at home   3. Stage IV Colon Cancer mets to lung and brain, NED --Noraa continues to do well with no evidence for disease recurrence.  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   -She is clinically doing very well. Her CEA level has been normal, today's result is still pending  - Brain MRI in 10/2015 was negative for recurrence. Brain MRI in 04/2016 was negative for recurrence. -her last restaging CT scans in 01/2015  showed no evidence of recurrence - she is clinically doing well, physical exam is unremarkable,  CEA has been normal, we'll continue observation. - she is 5 years out of her brain recurrence, follow up with lab CBC, CMP and CEA every 3 months. I do not plan to do surveillance CT scan -Continue follow up with Dr. Lisbeth Renshaw  4. Peripheral neuropathy, grade 1, secondary to prior chemotherapy  -stable  5 Genetics -Given her positive family and personal history of breast (Mother at age of 100 and cousin) and colon cancer (cousin), she was referred to genetic counselor -Her genetic test was negative  6. Bone health  -She has not had a bone density scan for several years. -We discussed that letrozole may weaken her bone -I encouraged her to exercise regularly, and take calcium and vitamin D.  -Since her calcium is slightly elevated I suggest cutting her calcium pill into half.  -Bone density scan in 11/02/16 was normal    She will continue follow-up with her primary care physician for other medical problems  Plan -continue letrozole, refilled today. -Lab and f/u in 4 months -Mammogram in July 2018. -reduce calcium pill  I spent 25 minutes counseling the patient face to face. The total time spent in the appointment was 30 minutes. She knows to call us if she has any concern or issues before her next appointment.    Truitt Merle  01/27/2017   This document serves as a record of services personally performed by Truitt Merle, MD. It was created on her behalf by Joslyn Devon, a trained medical scribe. The creation of this record is based on the scribe's personal observations and the provider's statements to them. This document has been checked and approved by the attending provider.

## 2017-01-27 ENCOUNTER — Ambulatory Visit (HOSPITAL_BASED_OUTPATIENT_CLINIC_OR_DEPARTMENT_OTHER): Payer: Medicare HMO | Admitting: Hematology

## 2017-01-27 ENCOUNTER — Other Ambulatory Visit (HOSPITAL_BASED_OUTPATIENT_CLINIC_OR_DEPARTMENT_OTHER): Payer: Medicare HMO

## 2017-01-27 ENCOUNTER — Telehealth: Payer: Self-pay | Admitting: Hematology

## 2017-01-27 VITALS — BP 147/74 | HR 79 | Temp 98.0°F | Resp 18 | Ht 64.0 in | Wt 189.7 lb

## 2017-01-27 DIAGNOSIS — G620 Drug-induced polyneuropathy: Secondary | ICD-10-CM

## 2017-01-27 DIAGNOSIS — C186 Malignant neoplasm of descending colon: Secondary | ICD-10-CM

## 2017-01-27 DIAGNOSIS — C50211 Malignant neoplasm of upper-inner quadrant of right female breast: Secondary | ICD-10-CM

## 2017-01-27 DIAGNOSIS — I10 Essential (primary) hypertension: Secondary | ICD-10-CM

## 2017-01-27 DIAGNOSIS — Z85038 Personal history of other malignant neoplasm of large intestine: Secondary | ICD-10-CM | POA: Diagnosis not present

## 2017-01-27 DIAGNOSIS — Z79811 Long term (current) use of aromatase inhibitors: Secondary | ICD-10-CM

## 2017-01-27 DIAGNOSIS — Z170 Estrogen receptor positive status [ER+]: Secondary | ICD-10-CM

## 2017-01-27 LAB — CBC WITH DIFFERENTIAL/PLATELET
BASO%: 0.5 % (ref 0.0–2.0)
Basophils Absolute: 0 10*3/uL (ref 0.0–0.1)
EOS ABS: 0.2 10*3/uL (ref 0.0–0.5)
EOS%: 2.8 % (ref 0.0–7.0)
HCT: 42.9 % (ref 34.8–46.6)
HEMOGLOBIN: 14 g/dL (ref 11.6–15.9)
LYMPH%: 20.2 % (ref 14.0–49.7)
MCH: 27.4 pg (ref 25.1–34.0)
MCHC: 32.6 g/dL (ref 31.5–36.0)
MCV: 84.3 fL (ref 79.5–101.0)
MONO#: 0.7 10*3/uL (ref 0.1–0.9)
MONO%: 8.3 % (ref 0.0–14.0)
NEUT%: 68.2 % (ref 38.4–76.8)
NEUTROS ABS: 6 10*3/uL (ref 1.5–6.5)
Platelets: 194 10*3/uL (ref 145–400)
RBC: 5.08 10*6/uL (ref 3.70–5.45)
RDW: 15.2 % — AB (ref 11.2–14.5)
WBC: 8.7 10*3/uL (ref 3.9–10.3)
lymph#: 1.8 10*3/uL (ref 0.9–3.3)

## 2017-01-27 LAB — COMPREHENSIVE METABOLIC PANEL
ALBUMIN: 3.7 g/dL (ref 3.5–5.0)
ALK PHOS: 122 U/L (ref 40–150)
ALT: 32 U/L (ref 0–55)
AST: 29 U/L (ref 5–34)
Anion Gap: 10 mEq/L (ref 3–11)
BILIRUBIN TOTAL: 0.86 mg/dL (ref 0.20–1.20)
BUN: 12.8 mg/dL (ref 7.0–26.0)
CO2: 28 meq/L (ref 22–29)
Calcium: 10.6 mg/dL — ABNORMAL HIGH (ref 8.4–10.4)
Chloride: 105 mEq/L (ref 98–109)
Creatinine: 0.9 mg/dL (ref 0.6–1.1)
EGFR: 84 mL/min/{1.73_m2} — AB (ref >90)
GLUCOSE: 93 mg/dL (ref 70–140)
Potassium: 4.6 mEq/L (ref 3.5–5.1)
SODIUM: 142 meq/L (ref 136–145)
TOTAL PROTEIN: 7.4 g/dL (ref 6.4–8.3)

## 2017-01-27 LAB — CEA (IN HOUSE-CHCC): CEA (CHCC-In House): 1.94 ng/mL (ref 0.00–5.00)

## 2017-01-27 NOTE — Telephone Encounter (Signed)
Scheduled appt per 7/13 los - Gave patient AVS and calender per los. - lab and f/u in 4 months.

## 2017-01-29 ENCOUNTER — Encounter: Payer: Self-pay | Admitting: Hematology

## 2017-02-03 ENCOUNTER — Ambulatory Visit
Admission: RE | Admit: 2017-02-03 | Discharge: 2017-02-03 | Disposition: A | Discharge status: Home / Self Care | Payer: Medicare HMO | Source: Ambulatory Visit | Attending: Hematology | Admitting: Hematology

## 2017-02-03 DIAGNOSIS — C50211 Malignant neoplasm of upper-inner quadrant of right female breast: Secondary | ICD-10-CM

## 2017-02-03 DIAGNOSIS — R928 Other abnormal and inconclusive findings on diagnostic imaging of breast: Secondary | ICD-10-CM | POA: Diagnosis not present

## 2017-02-03 DIAGNOSIS — Z170 Estrogen receptor positive status [ER+]: Secondary | ICD-10-CM

## 2017-02-03 HISTORY — DX: Personal history of antineoplastic chemotherapy: Z92.21

## 2017-02-03 HISTORY — DX: Personal history of irradiation: Z92.3

## 2017-03-08 ENCOUNTER — Other Ambulatory Visit (HOSPITAL_COMMUNITY): Payer: Self-pay | Admitting: Physician Assistant

## 2017-03-08 DIAGNOSIS — R2241 Localized swelling, mass and lump, right lower limb: Secondary | ICD-10-CM | POA: Diagnosis not present

## 2017-03-08 DIAGNOSIS — Z713 Dietary counseling and surveillance: Secondary | ICD-10-CM | POA: Diagnosis not present

## 2017-03-08 DIAGNOSIS — J302 Other seasonal allergic rhinitis: Secondary | ICD-10-CM | POA: Diagnosis not present

## 2017-03-08 DIAGNOSIS — Z6831 Body mass index (BMI) 31.0-31.9, adult: Secondary | ICD-10-CM | POA: Diagnosis not present

## 2017-03-08 DIAGNOSIS — I10 Essential (primary) hypertension: Secondary | ICD-10-CM | POA: Diagnosis not present

## 2017-03-08 DIAGNOSIS — G620 Drug-induced polyneuropathy: Secondary | ICD-10-CM | POA: Diagnosis not present

## 2017-03-08 DIAGNOSIS — E782 Mixed hyperlipidemia: Secondary | ICD-10-CM | POA: Diagnosis not present

## 2017-03-08 DIAGNOSIS — Z23 Encounter for immunization: Secondary | ICD-10-CM | POA: Diagnosis not present

## 2017-03-10 ENCOUNTER — Ambulatory Visit (HOSPITAL_COMMUNITY)
Admission: RE | Admit: 2017-03-10 | Discharge: 2017-03-10 | Disposition: A | Discharge status: Home / Self Care | Payer: Medicare HMO | Source: Ambulatory Visit | Attending: Physician Assistant | Admitting: Physician Assistant

## 2017-03-10 DIAGNOSIS — R2241 Localized swelling, mass and lump, right lower limb: Secondary | ICD-10-CM

## 2017-03-15 DIAGNOSIS — M2010 Hallux valgus (acquired), unspecified foot: Secondary | ICD-10-CM | POA: Diagnosis not present

## 2017-03-15 DIAGNOSIS — M79673 Pain in unspecified foot: Secondary | ICD-10-CM | POA: Diagnosis not present

## 2017-03-15 DIAGNOSIS — M24576 Contracture, unspecified foot: Secondary | ICD-10-CM | POA: Diagnosis not present

## 2017-03-15 DIAGNOSIS — G5761 Lesion of plantar nerve, right lower limb: Secondary | ICD-10-CM | POA: Insufficient documentation

## 2017-03-15 DIAGNOSIS — M76899 Other specified enthesopathies of unspecified lower limb, excluding foot: Secondary | ICD-10-CM | POA: Insufficient documentation

## 2017-03-15 DIAGNOSIS — M7751 Other enthesopathy of right foot: Secondary | ICD-10-CM | POA: Diagnosis not present

## 2017-04-05 DIAGNOSIS — M24576 Contracture, unspecified foot: Secondary | ICD-10-CM | POA: Diagnosis not present

## 2017-04-05 DIAGNOSIS — M2010 Hallux valgus (acquired), unspecified foot: Secondary | ICD-10-CM | POA: Diagnosis not present

## 2017-04-05 DIAGNOSIS — M79673 Pain in unspecified foot: Secondary | ICD-10-CM | POA: Diagnosis not present

## 2017-04-05 DIAGNOSIS — M7750 Other enthesopathy of unspecified foot: Secondary | ICD-10-CM | POA: Diagnosis not present

## 2017-05-22 ENCOUNTER — Telehealth: Payer: Self-pay | Admitting: Hematology

## 2017-05-22 NOTE — Telephone Encounter (Signed)
Left message for patient re change in time for lab/fu 11/14 @ 12:45pm.

## 2017-05-29 NOTE — Progress Notes (Signed)
Hematology and Oncology Follow Up Visit Date of Visit: 05/31/2017   Kayla [name] [id number] [birth year] 66 y.o. 05/31/2017   PCP: [place] [phone])  Principle Diagnosis:  1. Colon cancer, T4N1M1, Stage IV, diagnosed on 10/27/2008, brain recurrence in 03/2011  2. Stage IIA right breast cancer, diagnosed in 02/2015  Oncology History   Breast cancer of upper-inner quadrant of right female breast [facility])   Staging form: Breast, AJCC 7th Edition     Clinical: Stage IIA (T2, N0, M0) - Unsigned     Pathologic stage from 04/14/2015: Stage IIA (T2, N0, cM0) - Signed by Truitt Merle, MD on 05/05/2015 Cancer of left colon [facility])   Staging form: Colon and Rectum, AJCC 7th Edition     Pathologic: T4a, N1a, M1 - Unsigned        Breast cancer of upper-inner quadrant of right female breast ([place])   02/27/2015 Mammogram    Diagnostic mammogram showed a 2.5 cm irregular mass within the far posterior upper inner right breast, ultrasound confirmed a 1.9 x 1.0 x 0.8 cm mass at 1:30 o'clock 15 submitted from nipple. No axillary adenopathy      03/03/2015 Initial Biopsy    Right breast needle biopsy showed invasive ductal carcinoma, grade 1-2.      03/03/2015 Receptors her2    ER 100%+, PR 80%+, ki67 15%, HER2/neu negative      03/03/2015 Clinical Stage    Stage IIA: T2 N0      03/18/2015 Procedure    VUS at POLD1 gene called c.327G>C. neg at APC, ATM, AXIN2, BARD1, BMPR1A, BRCA1, BRCA2, BRIP1, CDH1, CDK4, CDKN2A, CHEK2, EPCAM, FANCC, MLH1, MSH2, MSH6, MUTYH, NBN, PALB2, PMS2, POLD1, POLE, PTEN, RAD51C, RAD51D, SCG5/GREM1, SMAD4, STK11, TP53, VHL,XRCC2      04/14/2015 Surgery    Right breast lumpectomy and sentinel lymph node biopsy. Surgical margins were negative.      04/14/2015 Pathology Results    right breast invasive ductal carcinoma, grade 2, 2.7 cm,(+) DCIS, margins were negative, 4 sentinel lymph nodes and one axillary lymph nodes were negative, (+)lymphovascular  invasion.      04/14/2015 Pathologic Stage    Stage IIA: T2 N0      04/14/2015 Oncotype testing    RS 30, which predicts 10-year risk of distance recurrence with tamoxifen alone 20%, intermediate risk      05/21/2015 - 07/24/2015 Adjuvant Chemotherapy     Adjuvant chemotherapy with docetaxel 75 mg/m, and Cytoxan 600 mg/m X 4 cycles      05/26/2015 - 05/29/2015 Hospital Admission    Pt was admitted for UTI and syncope, treated with antibiotics and IVF       08/17/2015 - 09/13/2015 Radiation Therapy    Adjuvant RT: Right breast treated to 42.5 Gy in 17 fractions, Right breast boost treated to 7.5 Gy in 3 fractions      10/14/2015 -  Anti-estrogen oral therapy    Letrozole 2.5 mg daily      11/13/2015 Survivorship    Survivorship visit completed      02/02/2016 Mammogram    02/02/2016 Bilateral diagnostic mammogram showed lumpectomy change in the right breast, no evidence of malignancy.      Oncology History: Diagnosis of colon cancer dates back to 10/27/2008 when Kayla [name] presented with what turned out to be a bowel perforation through tumor. Stage at that time was T4 N1 M1 with pulmonary metastatic disease. The K-ras mutation was detected. Kayla [name] underwent surgical resection of her tumor with a colostomy  at the time of her surgery on 10/27/2008. The colostomy has been subsequently reversed on 09/20/2010. Kayla [name] received chemotherapy consisting of FOLFOX for 10 treatments from 12/10/2008 through 06/02/2009. She achieved a partial remission from these treatments. She then received additional chemotherapy with 5-FU, leucovorin, and 5-FU by continuous infusion along with Avastin from 06/23/2009 through 11/17/2009. 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 She was doing well without evidence of disease until she developed a right hemiparesthesia in September 2012 and was found to have a 3 x 3 x 2.4 cm, lobulated, enhancing mass in the left parietal lobe with  marked surrounding edema. A PET scan on 03/25/2011 showed resolution of the previously identified pulmonary nodules with no residual hypermetabolic activity. The pericardial effusion, which has been present since diagnosis, was unchanged. There were also uterine fibroids and a low-density lesion in the anterior spleen that was stable and not associated with any hypermetabolic activity. Of note, [place] had a markedly elevated CEA up to 24.7 on 03/23/2011. On 06/23/2011, the CEA was less than 0.5. Dr. Erline Levine resected the recurrent metastatic colon cancer on 03/31/2011. This was followed by stereotactic radiation on 04/15/2011. The patient underwent a left-sided craniotomy and excision of the mass involving her left parietal lobe on 11/10/2011 by Dr. Erline Levine. The pathology report was negative for any malignancy. Pathology report indicated benign brain with fibrosis, hemorrhage, hemosiderin deposition, abundant dystrophic calcifications, and mixed acute and chronic inflammation including foreign body multinucleated giant cells.    Prior Therapy:   1. Kayla [name] underwent surgical resection of her tumor with a colostomy at the time of her surgery on 10/27/2008. The colostomy has been subsequently reversed on 09/20/2010. Kayla [name] received chemotherapy consisting of FOLFOX for 10 treatments from 12/10/2008 through 06/02/2009. She achieved a partial remission from these treatments. She then received additional chemotherapy with 5-FU, leucovorin, and 5-FU by continuous infusion along with Avastin from 06/23/2009 through 11/17/2009. 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  2. See above for her breast cancer treatment history   Current therapy:  Adjuvant letrozole 2.30m daily started on 10/14/2015  Interim History:  Kayla TAYLERreturns for follow-up. She presents to the clinic today. She is overall doing well. She notes her hair is thin and has slow growth. She has been taking  letrozole. She notes left shoulder pain and she has occasional issues lifting her left arm. This started about 1 months ago. Some days the pain is not significant. She notes having hot flashes all the time, but manageable. Her appetite and eating is normal.  She notes occasional cramping pain in her incision from breast surgery. She sometimes does self breast exams. She does use corn starch under her breast to keep it dry. She notes swelling in her ankles that will subside through out the day. She is no longer taking Vitamin D. Her neuropathy has not changed or gotten worse and is willing to stop gabapentin. She did get her Pneumonia shot but had not gotten her flu shot.    Past Medical History:  Diagnosis Date  . Allergy    sulfa  . Arthritis   . Blood transfusion   . 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 [place] cancer [place]    colon/ 2010/surg/chemo  . Diverticulosis 07/22/2010   sigmoid colon  . Family history of breast cancer   . Family history of colon cancer   . GERD (gastroesophageal reflux disease)   .  Headache(784.0)   . Heart murmur   . History of radiation therapy 04/15/11   17 Gy single fraction  l parietal brain metastais  . Hypertension   . LVH (left ventricular hypertrophy)    mod/severe. echo 1/11. EF 65-70%   . Neuromuscular disorder ([place])    peripheral neuropathy feet  . Pericardial effusion    echo 08/13/09  . Peripheral vascular disease ([place])   . Personal history of chemotherapy   . Personal history of radiation therapy   . S/P radiation therapy 08/18/15-09/14/15   right breast  . Shortness of breath   . Thrombocytopenia ([place])    GYN HISTORY  Menarchal: 14 LMP: 54 Contraceptive: a few years  HRT: no  G2P2:    Medications: I have reviewed the patient's current medications.  Current Outpatient Medications  Medication Sig Dispense Refill  . acetaminophen (TYLENOL) 500 MG tablet Take 1,000 mg by mouth every 6 (six) hours as needed for  mild pain, moderate pain or headache. Reported on 09/30/2015    . fluticasone (FLONASE) 50 MCG/ACT nasal spray Place 1 spray into both nostrils daily as needed for allergies. Reported on 11/12/2015    . gabapentin (NEURONTIN) 300 MG capsule TAKE ONE CAPSULE BY MOUTH AT BEDTIME (Patient taking differently: TAKE 300 MG BY MOUTH AT BEDTIME) 90 capsule 0  . letrozole (FEMARA) 2.5 MG tablet Take 1 tablet (2.5 mg total) daily by mouth. 90 tablet 1  . lisinopril (PRINIVIL,ZESTRIL) 40 MG tablet Take 1 tablet (40 mg total) by mouth daily. 90 tablet 0  . pravastatin (PRAVACHOL) 20 MG tablet Take 20 mg by mouth daily.     [name] VITAMIN D, ERGOCALCIFEROL, PO Take 2 drops every other day by mouth. 2000 units     No current facility-administered medications for this visit.    Allergies:  Allergies  Allergen Reactions  . Sulfonamide Derivatives Rash    Past Medical History, Surgical history, Social history, and Family History were reviewed and updated.  SMOKING HISTORY:  The patient has smoked intermittently, about 2 packs of cigarettes per week, since she was a teenager, but stopped smoking in 2010.  Review of Systems:  Constitutional:  Negative for fever, chills, anorexia, weight loss, pain. (+) Hot flashes (+) hair growing back slow Cardiovascular: no chest pain or dyspnea on exertion (+) occasional ankle swelling Respiratory: no cough, shortness of breath, or wheezing  Neurological: no TIA or stroke symptoms Dermatological: negative for rash ENT: negative for - epistaxis, headaches, tinnitus, vertigo or visual changes Skin: Negative. Gastrointestinal: no abdominal pain, change in bowel habits, or black or bloody stools positive for - heartburn Genito-Urinary: no dysuria, trouble voiding, or hematuria Hematological and Lymphatic: negative for - bleeding problems, fatigue, jaundice or pallor Breast: negative for breast lumps (+) occasional pain from surgery  Musculoskeletal: (+) Left shoulder pain    Remaining ROS negative.  Physical Exam: Blood pressure (!) 156/87, pulse 77, temperature 98.4 F (36.9 C), temperature source Oral, resp. rate 18, height 5' 4"  (1.626 m), weight 189 lb 9.6 oz (86 kg), SpO2 98 %. ECOG: 0 General appearance: alert, cooperative, appears stated age, no distress and mildly obese Head: Normocephalic, without obvious abnormality, atraumatic Neck: no adenopathy, supple, symmetrical, trachea midline and thyroid not enlarged, symmetric, no tenderness/mass/nodules HEENT: PERRLA; EOMi; No sclerae icterus. OP clear of masses.  Lymph nodes: Cervical, supraclavicular, and axillary nodes normal. Heart:regular rate and rhythm, S1, S2 normal, no murmur, click, rub or gallop Lung:chest clear, no wheezing, rales, normal symmetric air entry,  +  R sided port-a-cath. Abdomin: soft, non-tender, without masses or organomegaly, normal bowel sounds and surgical scars well healed. EXT:No peripheral edema.  R>L decreased sensation and mildly decreased strength (noted in prior exams) Neuro: R lower extremity weakness and decreased sensation.  Normal gait.  Good finger to nose bilaterally. Otherwise no focal deficits. Breasts: Breast inspection showed them to be symmetrical with no nipple discharge. The surgical incision in right breast and axilla are well-healed, no tenderness. (+) Mild skin pigmentation of the right breast. (+) palpable (previously 2.5 x 4 cm) now 2 x 2.5 cm seroma in UIQ of right breast underneath surgical incision   Lab Results: CBC Latest Ref Rng & Units 05/31/2017 01/27/2017 09/28/2016  WBC 3.9 - 10.3 10e3/uL 8.0 8.7 7.0  Hemoglobin 11.6 - 15.9 g/dL 13.3 14.0 14.5  Hematocrit 34.8 - 46.6 % 41.0 42.9 43.9  Platelets 145 - 400 10e3/uL 205 194 215    CMP Latest Ref Rng & Units 05/31/2017 01/27/2017 09/28/2016  Glucose 70 - 140 mg/dl 95 93 130  BUN 7.0 - 26.0 mg/dL 11.5 12.8 16.9  Creatinine 0.6 - 1.1 mg/dL 0.9 0.9 0.9  Sodium 136 - 145 mEq/L 143 142 142  Potassium  3.5 - 5.1 mEq/L 4.2 4.6 4.1  Chloride 101 - 111 mmol/L - - -  CO2 22 - 29 mEq/L 28 28 27   Calcium 8.4 - 10.4 mg/dL 10.3 10.6(H) 10.4  Total Protein 6.4 - 8.3 g/dL 7.3 7.4 7.6  Total Bilirubin 0.20 - 1.20 mg/dL 0.95 0.86 0.62  Alkaline Phos 40 - 150 U/L 119 122 126  AST 5 - 34 U/L 23 29 27   ALT 0 - 55 U/L 27 32 33    Results for MARALEE, HIGUCHI (MRN [id number]) as of 01/27/2017 14:48  Ref. Range 06/29/2016 10:17 09/28/2016 09:49 01/27/2017 12:08  CEA Latest Ref Range: 0.0 - 4.7 ng/mL 2.6    CEA ([facility]) Latest Ref Range: 0.00 - 5.00 ng/mL 2.48 1.83 1.94  PENDING 05/31/17  PATHOLOGY REPORT Diagnosis 04/14/2015 1. Breast, lumpectomy, right - INVASIVE GRADE II DUCTAL CARCINOMA, SPANNING 2.7 CM IN GREATEST DIMENSION. - ASSOCIATED INTERMEDATE GRADE DUCTAL CARCINOMA IN SITU. - LYMPH/VASCULAR INVASION IS IDENTIFIED. - MARGINS ARE NEGATIVE. - SEE ONCOLOGY TEMPLATE. 2. Lymph node, sentinel, biopsy, right axillary - ONE BENIGN LYMPH NODE WITH NO TUMOR SEEN (0/1). 3. Lymph node, sentinel, biopsy, right axillary - ONE BENIGN LYMPH NODE WITH NO TUMOR SEEN (0/1). 4. Lymph node, sentinel, biopsy, right axillary - ONE BENIGN LYMPH NODE WITH NO TUMOR SEEN (0/1). 5. Lymph node, biopsy, right axillary - ONE BENIGN LYMPH NODE WITH NO TUMOR SEEN (0/1). Microscopic Comment 1. BREAST, INVASIVE TUMOR, WITH LYMPH NODES PRESENT Specimen, including laterality and lymph node sampling (sentinel, non-sentinel): Right partial breast with right sentinel lymph node sampling. Procedure: Right breast lumpectomy with right sentinel lymph node biopsies. Histologic type: Invasive ductal carcinoma. Grade: 2. Tubule formation: 3. Nuclear pleomorphism: 2. Mitotic: 1. Tumor size (gross measurement): 2.7 cm. Margins: Invasive, distance to closest margin: 0.2 cm (anterior margin). In-situ, distance to closest margin: At least 0.4 cm (all margins). Lymphovascular invasion: Yes, lymph/vascular invasion is  identified. Ductal carcinoma in situ: Yes. Grade: Intermediate grade. Extensive intraductal component: No. Lobular neoplasia: Not identified. Tumor focality: Unifocal. Treatment effect: Not applicable. Extent of tumor: Tumor confined to breast parenchyma. Skin: Not received. Nipple: Not received. Skeletal muscle: Not received. Lymph nodes Examined: 3 Sentinel. 1 Non-sentinel. 4 Total. Lymph nodes with metastasis: 0. Isolated tumor cells (< 0.2 mm): 0.  Micrometastasis: (> 0.2 mm and < 2.0 mm): 0. Macrometastasis: (> 2.0 mm): 0. Extracapsular extension: Not applicable. Breast prognostic profile: Performed on previous case [phone]: Estrogen receptor: 100%, positive. Progesterone receptor: 80%, positive. Her-2 neu: 1.30 ratio, negative. Ki-67: 15%. Non-neoplastic breast: Unremarkable. TNM: pT2, pN0. Comments: As Her-2 neu was previously negative, this will be repeated on representative tumor from the current specimen, and will be reported in an addendum to follow. (RH:ds 04/15/15) Results: HER2 - NEGATIVE RATIO OF HER2/CEP17 SIGNALS 1.17 AVERAGE HER2 COPY NUMBER PER CELL 2.05  PROCEDURES COLONOSCOPY 11/20/2013 ENDOSCOPIC IMPRESSION: 1. Diminutive sessile polyp was found in the transverse colon; polypectomy was performed with cold forceps 2. There was evidence of a prior colo-colonic surgical anastomosis in the left colon 3. The colon mucosa was otherwise normal - excellent prep RECOMMENDATIONS: Repeat Colonoscopy in 5 years.  Radiological Studies:   Diagnostic Mammogram Bilateral 02/03/17 IMPRESSION: No evidence of malignancy within either breast. Stable postsurgical changes within the right breast. RECOMMENDATION: Bilateral diagnostic mammogram in 1 year.   Bone Density 10/19/16 ASSESSMENT: The BMD measured at Femur Neck Left is 0.987 g/cm2 with a T-score of -0.4.   1. MRI of the head with and without IV contrast on 03/23/2011 showed a solitary enhancing  mass in the left parietal lobe with surrounding white matter edema, most compatible with solitary metastatic deposit. The mass lesion measured 2.7 x 2.5 cm.  2. PET scan from 03/25/2011 showed resolution of the previously- identified pulmonary nodules with no residual hypermetabolic activity noted in the neck, chest, abdomen or pelvis to suggest active malignancy. There was a pericardial effusion and some subsegmental atelectasis in the lower lobes. There were also uterine fibroids and a low-density lesion in the anterior spleen that was stable and not associated with hypermetabolic activity.  3. MRI of the head with and without IV contrast showed Stealth protocol utilized to evaluate left parietal mass. The mass measured 3 x 3 x 2.4 cm with marked surrounding vasogenic edema.  4. MRI of the head with and without IV contrast on 04/08/2011 showed postsurgical changes of tumor removal in the left parietal lobe and a postsurgical hematoma with enhancement. There was 3 mm of midline shift. No other metastatic deposits.  5. MRI of the head with and without IV contrast on 06/28/2011 showed a 3 cm area of restricted diffusion lying within the previous area of  left parietal surgical resection for colon cancer. There was enhancement of the peripheral aspect of the cavity, marked restricted diffusion and increasing vasogenic edema. There was concern about the development of a brain abscess.  6. MRI of the head with and without IV contrast on 07/29/2011 showed left parietal postoperative hematoma was smaller compared with the  prior study. There was no nodular enhancement seen to indicate tumor. No new enhancing lesions were seen. There was significant  improvement in the left parietal edema.  7. Digital screening mammogram was negative on 08/02/2011.  8. An MRI of the head with and without IV contrast on 10/21/2011 showed development of gyriform enhancement circumferentially along the margins of the left parietal  postoperative space/hematoma. Marked increase in vasogenic edema was seen. 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 There was further contraction of the hematoma/postoperative space in the left parietal cortical region, now measuring 17 x 19 mm in transverse diameter as opposed to 22 x 21 mm previously.  Left-to-right shift was 2 mm.  9. Nuclear medicine brain PET-CT scan  carried out on 10/27/2011 showed hypermetabolic activity in the high left parietal lobe which corresponds to gyriform enhancement on comparison MRI from 10/21/2011. This was felt to be most consistent with recurrent  tumor.  10. Chest x-ray, 2 view from 11/04/2011, showed chronic cardiomegaly. Port-A-Cath was in place. No acute disease was present.  11. MRI of the head with and without IV contrast on 01/27/2012 showed findings that were felt to be consistent with progression of disease. This exam was compared with the MRI of 10/21/2011. It was recognized that the patient underwent surgical resection of the mass on 11/10/2011.  There was felt to be on the present exam residual peripheral enhancement of approximately 35 x 40 x 21 mm with infiltration of the cortex and deep white matter. It was felt that the enhancement had progressed, despite removal of the central focus. There was moderate vasogenic  edema throughout the left hemisphere but slightly decreased from the MRI from 10/21/2011. No new lesions were seen. There was mild atrophy with chronic microvascular ischemic change, but no midline shift. Major intracranial vascular structures were patent.  12. MRI of the brain with and without IV contrast on 05/04/2012 showed previous left parietal lobe surgery for resection of tumor and radiation necrosis.  No new abnormalities were noted. 13. Chest x-ray, 2 view, on 07/03/2012 showed enlargement of the cardiac silhouette with some lingular scarring, as compared with the chest x-ray  from 11/04/2011. 14. 2-D echocardiogram from 07/12/2012 showed left ventricular ejection fraction of 55-60%.  There was a small to moderate circumferential pericardial effusion with no signs of tamponade.  There was felt to be no significant change from the 2-D echocardiogram dated 08/13/2009. 15. Digital bilateral screening mammogram on 08/06/2012 was negative. 16. 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  There was no progression or new brain metastasis identified. 17. PET scan from 10/16/2012 showed no specific features to suggest metastatic disease.  There was diffuse uptake throughout the thyroid gland, possibly due to hyperthyroidism. 18. MRI of the brain with and without IV contrast on 03/12/2013 showed residual gyriform enhancement surrounding the area of previous surgical resection of a left parietal metastasis with subsequent resection of radionecrosis. The findings likely represent some residual brain injury, but there is no significant progression of radionecrosis and no new lesions seen. 19. MRI of the brain with and without IV contrast on 11/01/2013 showed Stable posttherapy appearance of the brain since May of 2014. No progression or new brain metastasis identified. 20. CT chest,abomen and pelvis with contrast. 1. No evidence of thoracic metastasis.  2. Small prevascular lymph node is similar prior. 3. Stable pericardial effusion. 4. Interval increase in the volume presacral lymph node in the upper pelvis. This has progressed slowly from 2011. Recommend attention on follow-up versus restaging FDG PET scan. 5. Stable anastomosis in the descending colon. 21. CT chest, abdomen and pelvis with contrast 07/126/2015. 1. No evidence of local colon cancer recurrence or metastasis within the abdomen or pelvis. 2. Retention of stool at the colocolonic  anastomosis in the left colon is unchanged comparison exam. No obstructing lesion identified.  22. Mesenteric lymph node is again demonstrated with no increased size. 4. Stable splenic lesion likely representing a complex cysts or hemangioma. 5. Stable small pericardial effusion 23. Brain MRI 11/11/2014: stable and satisfactory posttherapy appearance of the brain since 2014 and 2015. No new brain  metastasis identified. 24. CT chest, abdomen and pelvis w contrast 02/02/2015: 1. No new or progressive findings to suggest recurrent colorectal, cancer in the chest, abdomen, or pelvis. 2. 16 mm soft tissue nodule in the medial right breast. Correlation with mammographic history recommended. 3. Fibroid change in the uterus. 4. Stable hypodense lesion in the anterior spleen, compatible with complex cyst or hemangioma. 5. Stable appearance of the trace pericardial effusion. 25. Brain MRI 10/29/2015: Continued stable and satisfactory posttherapy appearance of the brain. No new intracranial abnormality. 26. 02/02/2016 Bilateral diagnostic mammogram showed lumpectomy change in the right breast, no evidence of malignancy.  Impression and Plan: 66 y.o. African-American female, postmenopausal  1. Right breat invasive ductal carcinoma, pT2N0M0, stage IIA, ER+/PR+, HER2-, Oncotype RS 30 -I previously reviewed her surgical pathology findings with her in great details. -I reviewed her Oncotype DX test results. The recurrence score is 30, which predicts 10 year risk of distant recurrence 20% with tamoxifen alone. This is considered intermediate risk, but close to high risk group. -She received 4 cycles of adjuvant chemotherapy TC -She has completed adjuvant breast radiation -she started adjuvant letrozole on 10/13/16, will continue for a total of 5-10 years  -We'll continue breast cancer surveillance, including annual mammogram, self exam, and routine follow-up -she has hot flashes and some pain in her left upper arm from  letrozole, they are mild and tolerable. I suggest she try ibuprofen and a heating pad for the pain.   -Her residual neuropathy has been stable, I suggest she can stop Gabapentin as her neuropathy has not worsened.  -She is clinically doing well, physical exam and 01/2017 mammogram are unremarkable, her lab results reviewed with her, CBC and CMP are with in normal limits. There is no evidence of recurrence.  -I suggest she continue using corn starch under her breast to keep her skin dry.  -She has already received her pneumonia vaccination. I offered her the flu shot today, but she would like to think about it.  -f/u in 6 months    2.  HTN -continue medication  -continue monitoring BP at home   3. Stage IV Colon Cancer mets to lung and brain, NED --Kayla [name] continues to do well with no evidence for disease recurrence.  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   -She is clinically doing very well. Her CEA level has been normal, today's result is still pending  - Brain MRI in 10/2015 was negative for recurrence. Brain MRI in 04/2016 was negative for recurrence. -her last restaging CT scans in 01/2015 showed no evidence of recurrence - she is clinically doing well, physical exam is unremarkable,  CEA has been normal, we'll continue observation. - she is 5 years out of her brain recurrence, follow up with lab CBC, CMP and CEA every 6 months. I do not plan to do another surveillance CT scan  4. Peripheral neuropathy, grade 1, secondary to prior chemotherapy  -stable -I suggest she can try to stop gabapentin   5 Genetics -Given her positive family and personal history of breast (Mother at age of 78 and cousin) and colon cancer (cousin), she was referred to genetic counselor -Her genetic test was negative  6. Bone health  -She has not had a bone density scan for several years. -We discussed that letrozole may  weaken her bone -I encouraged her to exercise regularly -Since her calcium is  slightly elevated and she is off calcium and Vit D -Bone density scan in 11/02/16 was normal    She will continue follow-up with her primary care physician for other medical problems  Plan -Continue letrozole, refill today -Lab and f/u in 6 months   I spent 25 minutes counseling the patient face to face. The total time spent in the appointment was 30 minutes. She knows to call us if she has any concern or issues before her next appointment.    Truitt Merle  05/31/2017   This document serves as a record of services personally performed by Truitt Merle, MD. It was created on her behalf by Joslyn Devon, a trained medical scribe. The creation of this record is based on the scribe's personal observations and the provider's statements to them.     I have reviewed the above documentation for accuracy and completeness, and I agree with the above.

## 2017-05-31 ENCOUNTER — Encounter: Payer: Self-pay | Admitting: Hematology

## 2017-05-31 ENCOUNTER — Telehealth: Payer: Self-pay | Admitting: Hematology

## 2017-05-31 ENCOUNTER — Other Ambulatory Visit (HOSPITAL_BASED_OUTPATIENT_CLINIC_OR_DEPARTMENT_OTHER): Payer: Medicare HMO

## 2017-05-31 ENCOUNTER — Ambulatory Visit (HOSPITAL_BASED_OUTPATIENT_CLINIC_OR_DEPARTMENT_OTHER): Payer: Medicare HMO | Admitting: Hematology

## 2017-05-31 VITALS — BP 156/87 | HR 77 | Temp 98.4°F | Resp 18 | Ht 64.0 in | Wt 189.6 lb

## 2017-05-31 DIAGNOSIS — C50211 Malignant neoplasm of upper-inner quadrant of right female breast: Secondary | ICD-10-CM

## 2017-05-31 DIAGNOSIS — Z79811 Long term (current) use of aromatase inhibitors: Secondary | ICD-10-CM | POA: Diagnosis not present

## 2017-05-31 DIAGNOSIS — Z85038 Personal history of other malignant neoplasm of large intestine: Secondary | ICD-10-CM

## 2017-05-31 DIAGNOSIS — G620 Drug-induced polyneuropathy: Secondary | ICD-10-CM | POA: Diagnosis not present

## 2017-05-31 DIAGNOSIS — I10 Essential (primary) hypertension: Secondary | ICD-10-CM

## 2017-05-31 DIAGNOSIS — C186 Malignant neoplasm of descending colon: Secondary | ICD-10-CM

## 2017-05-31 DIAGNOSIS — Z170 Estrogen receptor positive status [ER+]: Secondary | ICD-10-CM

## 2017-05-31 LAB — COMPREHENSIVE METABOLIC PANEL
ALBUMIN: 3.6 g/dL (ref 3.5–5.0)
ALK PHOS: 119 U/L (ref 40–150)
ALT: 27 U/L (ref 0–55)
ANION GAP: 9 meq/L (ref 3–11)
AST: 23 U/L (ref 5–34)
BILIRUBIN TOTAL: 0.95 mg/dL (ref 0.20–1.20)
BUN: 11.5 mg/dL (ref 7.0–26.0)
CO2: 28 mEq/L (ref 22–29)
CREATININE: 0.9 mg/dL (ref 0.6–1.1)
Calcium: 10.3 mg/dL (ref 8.4–10.4)
Chloride: 106 mEq/L (ref 98–109)
Glucose: 95 mg/dl (ref 70–140)
Potassium: 4.2 mEq/L (ref 3.5–5.1)
Sodium: 143 mEq/L (ref 136–145)
TOTAL PROTEIN: 7.3 g/dL (ref 6.4–8.3)

## 2017-05-31 LAB — CBC WITH DIFFERENTIAL/PLATELET
BASO%: 0.6 % (ref 0.0–2.0)
Basophils Absolute: 0 10*3/uL (ref 0.0–0.1)
EOS%: 2.4 % (ref 0.0–7.0)
Eosinophils Absolute: 0.2 10*3/uL (ref 0.0–0.5)
HEMATOCRIT: 41 % (ref 34.8–46.6)
HEMOGLOBIN: 13.3 g/dL (ref 11.6–15.9)
LYMPH#: 1.6 10*3/uL (ref 0.9–3.3)
LYMPH%: 20.4 % (ref 14.0–49.7)
MCH: 27.3 pg (ref 25.1–34.0)
MCHC: 32.5 g/dL (ref 31.5–36.0)
MCV: 84.2 fL (ref 79.5–101.0)
MONO#: 0.6 10*3/uL (ref 0.1–0.9)
MONO%: 7.5 % (ref 0.0–14.0)
NEUT#: 5.6 10*3/uL (ref 1.5–6.5)
NEUT%: 69.1 % (ref 38.4–76.8)
Platelets: 205 10*3/uL (ref 145–400)
RBC: 4.86 10*6/uL (ref 3.70–5.45)
RDW: 14.6 % — AB (ref 11.2–14.5)
WBC: 8 10*3/uL (ref 3.9–10.3)

## 2017-05-31 LAB — CEA (IN HOUSE-CHCC): CEA (CHCC-IN HOUSE): 2.55 ng/mL (ref 0.00–5.00)

## 2017-05-31 MED ORDER — LETROZOLE 2.5 MG PO TABS: 2.5000 mg | ORAL_TABLET | Freq: Every day | ORAL | 1 refills | Status: DC

## 2017-05-31 NOTE — Telephone Encounter (Signed)
Gave avs and calendar for May 2019 °

## 2017-08-03 DIAGNOSIS — E782 Mixed hyperlipidemia: Secondary | ICD-10-CM | POA: Diagnosis not present

## 2017-08-03 DIAGNOSIS — I10 Essential (primary) hypertension: Secondary | ICD-10-CM | POA: Diagnosis not present

## 2017-08-23 ENCOUNTER — Other Ambulatory Visit: Payer: Self-pay | Admitting: Radiation Therapy

## 2017-08-23 DIAGNOSIS — C7949 Secondary malignant neoplasm of other parts of nervous system: Principal | ICD-10-CM

## 2017-08-23 DIAGNOSIS — C7931 Secondary malignant neoplasm of brain: Secondary | ICD-10-CM

## 2017-09-01 ENCOUNTER — Other Ambulatory Visit: Payer: Medicare HMO

## 2017-09-04 ENCOUNTER — Ambulatory Visit: Payer: Self-pay | Admitting: Radiation Oncology

## 2017-09-12 DIAGNOSIS — M62838 Other muscle spasm: Secondary | ICD-10-CM | POA: Diagnosis not present

## 2017-09-12 DIAGNOSIS — I10 Essential (primary) hypertension: Secondary | ICD-10-CM | POA: Diagnosis not present

## 2017-09-12 DIAGNOSIS — D171 Benign lipomatous neoplasm of skin and subcutaneous tissue of trunk: Secondary | ICD-10-CM | POA: Diagnosis not present

## 2017-09-12 DIAGNOSIS — E782 Mixed hyperlipidemia: Secondary | ICD-10-CM | POA: Diagnosis not present

## 2017-09-13 ENCOUNTER — Other Ambulatory Visit (HOSPITAL_COMMUNITY): Payer: Self-pay | Admitting: Family Medicine

## 2017-09-13 DIAGNOSIS — D171 Benign lipomatous neoplasm of skin and subcutaneous tissue of trunk: Secondary | ICD-10-CM

## 2017-09-19 ENCOUNTER — Ambulatory Visit (HOSPITAL_COMMUNITY)
Admission: RE | Admit: 2017-09-19 | Discharge: 2017-09-19 | Disposition: A | Discharge status: Home / Self Care | Payer: Medicare HMO | Source: Ambulatory Visit | Attending: Family Medicine | Admitting: Family Medicine

## 2017-09-19 DIAGNOSIS — R221 Localized swelling, mass and lump, neck: Secondary | ICD-10-CM | POA: Diagnosis not present

## 2017-09-19 DIAGNOSIS — D171 Benign lipomatous neoplasm of skin and subcutaneous tissue of trunk: Secondary | ICD-10-CM

## 2017-10-16 ENCOUNTER — Ambulatory Visit: Payer: Self-pay | Admitting: Surgery

## 2017-10-16 DIAGNOSIS — D171 Benign lipomatous neoplasm of skin and subcutaneous tissue of trunk: Secondary | ICD-10-CM | POA: Diagnosis not present

## 2017-10-16 NOTE — H&P (Signed)
Kayla [name] Documented: 10/16/2017 1:44 PM Location: [facility] Patient #: [id number] DOB: 20-Jan-1951 Divorced / Language: English / Race: Black or African American Female  History of Present Illness Kayla Fus [name] MD; 10/16/2017 3:36 PM) Patient words: Patient presents with chief complaint of a mass over her right upper back. I'm present for 9 years. She is complaining of some upper back pain. The pain is made worse with movement. The area is about the size of a softball over her right scapula. There is been no redness or drainage. There is no radiation of pain from this. It is mostly eats or when she reaches with her right arm but her back pain is in the midline away from the mass. There is no redness or drainage. There is no numbness or tingling of the extremity.  The patient is a 67 year old female.   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 Gabapentin (300MG  Capsule, Oral daily) Active. Lisinopril (40MG  Tablet, Oral daily) Active. Pravastatin Sodium (20MG  Tablet, Oral) Active. Medications Reconciled  Social History Kayla [name]; 10/16/2017 1:45 PM) Alcohol use Occasional alcohol use. Caffeine use Carbonated beverages. No drug use Tobacco use Former smoker.  Family History Kayla [name], [name]; 10/16/2017 1:45 PM) Breast Cancer Family Members In General, Mother. Colon Cancer Family Members In  General. Diabetes Mellitus Father. Heart Disease Father. Heart disease in female family member before age 60 Hypertension Family Members In General.  Pregnancy / Birth History Kayla [name]; 10/16/2017 1:45 PM) Age at menarche 14 years. Age of menopause 42-55 Gravida 2 Irregular periods Maternal age 1-20 Para 2  Other Problems Kayla [name]; 10/16/2017 1:45 PM) Arthritis Cancer Colon Cancer High blood pressure Lump In Breast     Review of Systems (Kayla Hannig A. Trevor Wilkie MD; 10/16/2017 3:37 PM) All other systems negative  Vitals (Kayla [name] [name]; 10/16/2017 1:45 PM) 10/16/2017 1:44 PM Weight: 191 lb Height: 64in Body Surface Area: 1.92 m Body Mass Index: 32.78 kg/m  BP: 130/86 (Sitting, Left Arm, Standard)      Physical Exam (Kayla Suess A. Domonic Kimball MD; 10/16/2017 3:37 PM)  General Mental Status-Alert. General Appearance-Consistent with stated age. Hydration-Well hydrated. Voice-Normal.  Integumentary Note: 6 cm before similar mobile fatty mass with a smooth margin overlying the right scapula. There is no redness, fluctuance and this appears easily movable in the subcutaneous fat  Eye Eyeball - Bilateral-Extraocular movements intact. Sclera/Conjunctiva - Bilateral-No scleral icterus.  Chest and Lung Exam Note: WOB normal no wheezing  Neurologic Neurologic evaluation reveals -alert and oriented x 3 with no impairment of recent or remote memory. Mental Status-Normal.  Musculoskeletal Normal Exam - Left-Upper Extremity Strength Normal and Lower Extremity Strength Normal. Normal Exam - Right-Upper Extremity Strength Normal and Lower Extremity Strength Normal.    Assessment & Plan (Kayla Hyland A. Gurdeep Keesey MD; 10/16/2017 3:39 PM)  LIPOMA OF BACK (D17.1) Impression: Discussed excision versus observation. It is not totally clear to me that this likely was causing the back pain she describes. It may be contributing  that she also may  have other causes of back pain including osteoarthritis of the spine or secondary to macromastia   she will think about excsion and let us know.  Current Plans Pt Education - [employer] Free Text Education/Instructions: discussed with patient and provided information. The pathophysiology of skin & subcutaneous masses was discussed. Natural history risks without surgery were discussed. I recommended surgery to remove the mass. I explained the technique of removal with use of local anesthesia & possible need for more aggressive sedation/anesthesia for patient comfort.  Risks such as bleeding, infection, wound breakdown, heart attack, death, and other risks were discussed. I noted a good likelihood this will help address the problem. Possibility that this will not correct all symptoms was explained. Possibility of regrowth/recurrence of the mass was discussed. We will work to minimize complications. Questions were answered. The patient expresses understanding & wishes to proceed with surgery.

## 2017-10-20 NOTE — Progress Notes (Signed)
  Kayla [name] 67 y.o. woman with Right-sided breast cancer 08-24-15 SRS to brain completed radiation 04-15-11,history of colon cancer2010 annual FU.    Headache: Pain: Dizziness: Nausea/vomiting: Ringing in ears: Visual changes (Blurred/ diplopia double vision,blind spots, and peripheral vsion changes): Fatigue: Cognitive changes: Taking Letrozole 2.5 mg daily  Had a mammogram scheduled? 02-03-17 Dr. Burr Medico  11-14-18Saw Dr. Burr Medico

## 2017-10-30 ENCOUNTER — Inpatient Hospital Stay
Admission: RE | Admit: 2017-10-30 | Discharge: 2017-10-30 | Disposition: A | Discharge status: Home / Self Care | Payer: Self-pay | Source: Ambulatory Visit | Attending: Radiation Oncology | Admitting: Radiation Oncology

## 2017-10-31 ENCOUNTER — Ambulatory Visit: Payer: Self-pay | Admitting: Radiation Oncology

## 2017-11-14 ENCOUNTER — Telehealth: Payer: Self-pay | Admitting: Hematology

## 2017-11-14 NOTE — Telephone Encounter (Signed)
Called pt re appt per yf being out of the office - spoke w/ pt re appts. Move to 5/13 to coordinate with radonc appt.

## 2017-11-22 ENCOUNTER — Encounter: Payer: Self-pay | Admitting: *Deleted

## 2017-11-22 NOTE — Progress Notes (Signed)
On 11-22-17 fax medical records to [facility] ,it was the last follow up note

## 2017-11-23 NOTE — Progress Notes (Signed)
Kayla [name] 67 y.o. woman withRight-sided breast cancer 08-24-15 SRS to braincompleted radiation 04-15-11,history of colon cancer2010 annual FU.    Headache: No Pain: None at this time.  Was having some pain in her right arm when she lifts it, it feels like something is pulling. Dizziness: No Nausea/vomiting:No Ringing in ears: No Visual changes (Blurred/ diplopia double vision,blind spots, and peripheral vsion changes): No Fatigue: Energy level slowly returning. Cognitive changes: Taking Letrozole 2.5 mg daily  Had a mammogram scheduled? 02-03-17 Dr. Burr Medico  11-14-18Saw Dr. Darreld Mclean

## 2017-11-24 NOTE — Progress Notes (Signed)
Hematology and Oncology Follow Up Visit Date of Visit: 11/27/2017   Kayla [name] [id number] May 02, 1951 67 y.o.   Date of Service:  11/27/2017   PCP: [facility] [phone])  Principle Diagnosis:  1. Colon cancer, T4N1M1, Stage IV, diagnosed on 10/27/2008, brain recurrence in 03/2011  2. Stage IIA right breast cancer, diagnosed in 02/2015  Oncology History   Breast cancer of upper-inner quadrant of right female breast [facility])   Staging form: Breast, AJCC 7th Edition     Clinical: Stage IIA (T2, N0, M0) - Unsigned     Pathologic stage from 04/14/2015: Stage IIA (T2, N0, cM0) - Signed by Truitt Merle, MD on 05/05/2015 Cancer of left colon [facility])   Staging form: Colon and Rectum, AJCC 7th Edition     Pathologic: T4a, N1a, M1 - Unsigned        Breast cancer of upper-inner quadrant of right female breast ([place])   02/27/2015 Mammogram    Diagnostic mammogram showed a 2.5 cm irregular mass within the far posterior upper inner right breast, ultrasound confirmed a 1.9 x 1.0 x 0.8 cm mass at 1:30 o'clock 15 submitted from nipple. No axillary adenopathy      03/03/2015 Initial Biopsy    Right breast needle biopsy showed invasive ductal carcinoma, grade 1-2.      03/03/2015 Receptors her2    ER 100%+, PR 80%+, ki67 15%, HER2/neu negative      03/03/2015 Clinical Stage    Stage IIA: T2 N0      03/18/2015 Procedure    VUS at POLD1 gene called c.327G>C. neg at APC, ATM, AXIN2, BARD1, BMPR1A, BRCA1, BRCA2, BRIP1, CDH1, CDK4, CDKN2A, CHEK2, EPCAM, FANCC, MLH1, MSH2, MSH6, MUTYH, NBN, PALB2, PMS2, POLD1, POLE, PTEN, RAD51C, RAD51D, SCG5/GREM1, SMAD4, STK11, TP53, VHL,XRCC2      04/14/2015 Surgery    Right breast lumpectomy and sentinel lymph node biopsy. Surgical margins were negative.      04/14/2015 Pathology Results    right breast invasive ductal carcinoma, grade 2, 2.7 cm,(+) DCIS, margins were negative, 4 sentinel lymph nodes and one axillary lymph nodes were negative,  (+)lymphovascular invasion.      04/14/2015 Pathologic Stage    Stage IIA: T2 N0      04/14/2015 Oncotype testing    RS 30, which predicts 10-year risk of distance recurrence with tamoxifen alone 20%, intermediate risk      05/21/2015 - 07/24/2015 Adjuvant Chemotherapy     Adjuvant chemotherapy with docetaxel 75 mg/m, and Cytoxan 600 mg/m X 4 cycles      05/26/2015 - 05/29/2015 Hospital Admission    Pt was admitted for UTI and syncope, treated with antibiotics and IVF       08/17/2015 - 09/13/2015 Radiation Therapy    Adjuvant RT: Right breast treated to 42.5 Gy in 17 fractions, Right breast boost treated to 7.5 Gy in 3 fractions      10/14/2015 -  Anti-estrogen oral therapy    Letrozole 2.5 mg daily      11/13/2015 Survivorship    Survivorship visit completed      02/02/2016 Mammogram    02/02/2016 Bilateral diagnostic mammogram showed lumpectomy change in the right breast, no evidence of malignancy.      Oncology History: Diagnosis of colon cancer dates back to 10/27/2008 when Kayla [name] presented with what turned out to be a bowel perforation through tumor. Stage at that time was T4 N1 M1 with pulmonary metastatic disease. The K-ras mutation was detected. Kayla [name] underwent surgical resection  of her tumor with a colostomy at the time of her surgery on 10/27/2008. The colostomy has been subsequently reversed on 09/20/2010. Kayla [name] received chemotherapy consisting of FOLFOX for 10 treatments from 12/10/2008 through 06/02/2009. She achieved a partial remission from these treatments. She then received additional chemotherapy with 5-FU, leucovorin, and 5-FU by continuous infusion along with Avastin from 06/23/2009 through 11/17/2009. 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 She was doing well without evidence of disease until she developed a right hemiparesthesia in September 2012 and was found to have a 3 x 3 x 2.4 cm, lobulated, enhancing mass in the left  parietal lobe with marked surrounding edema. A PET scan on 03/25/2011 showed resolution of the previously identified pulmonary nodules with no residual hypermetabolic activity. The pericardial effusion, which has been present since diagnosis, was unchanged. There were also uterine fibroids and a low-density lesion in the anterior spleen that was stable and not associated with any hypermetabolic activity. Of note, Kayla [name] had a markedly elevated CEA up to 24.7 on 03/23/2011. On 06/23/2011, the CEA was less than 0.5. Dr. Erline Levine resected the recurrent metastatic colon cancer on 03/31/2011. This was followed by stereotactic radiation on 04/15/2011. The patient underwent a left-sided craniotomy and excision of the mass involving her left parietal lobe on 11/10/2011 by Dr. Erline Levine. The pathology report was negative for any malignancy. Pathology report indicated benign brain with fibrosis, hemorrhage, hemosiderin deposition, abundant dystrophic calcifications, and mixed acute and chronic inflammation including foreign body multinucleated giant cells.    Prior Therapy:   1. Kayla [name] underwent surgical resection of her tumor with a colostomy at the time of her surgery on 10/27/2008. The colostomy has been subsequently reversed on 09/20/2010. Kayla [name] received chemotherapy consisting of FOLFOX for 10 treatments from 12/10/2008 through 06/02/2009. She achieved a partial remission from these treatments. She then received additional chemotherapy with 5-FU, leucovorin, and 5-FU by continuous infusion along with Avastin from 06/23/2009 through 11/17/2009. 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  2. See above for her breast cancer treatment history   Current therapy:  Adjuvant letrozole 2.31m daily started on 10/14/2015  Interim History:  Kayla LECKRONEreturns for follow-up. She was last seen by me 6 months ago. She presents to the clinic today by herself. 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 It has been sore lately. She notes Dr. CBrantley Stagewill do her surgery next week.  She notes she still has hot flashes from letrozole. She also has some joint pain. She did not get to PT after breast surgery but denies lymphedema.   On review of symptoms, pt notes hot flashes, manageable, and some joint pain.     Past Medical History:  Diagnosis Date  . Allergy    sulfa  . Arthritis   . Blood transfusion   . 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 Colon cancer ([place]    colon/ 2010/surg/chemo  . Diverticulosis 07/22/2010   sigmoid colon  . Family history of breast cancer   . Family history of colon cancer   . GERD (gastroesophageal reflux disease)   . Headache(784.0)   . Heart murmur   . History of radiation therapy 04/15/11   17 Gy single fraction  l parietal brain metastais  . Hypertension   . LVH (left ventricular hypertrophy)  mod/severe. echo 1/11. EF 65-70%   . Neuromuscular disorder ([place])    peripheral neuropathy feet  . Pericardial effusion    echo 08/13/09  . Peripheral vascular disease ([place])   . Personal history of chemotherapy   . Personal history of radiation therapy   . S/P radiation therapy 08/18/15-09/14/15   right breast  . Shortness of breath   . Thrombocytopenia ([place])    GYN HISTORY  Menarchal: 14 LMP: 54 Contraceptive: a few years  HRT: no  G2P2:    Medications: I have reviewed the patient's current medications.  Current Outpatient Medications  Medication Sig Dispense Refill  . acetaminophen (TYLENOL) 500 MG tablet Take 1,000 mg by mouth every 6 (six) hours as needed for mild pain, moderate pain or headache. Reported on 09/30/2015    . aspirin 81 MG chewable tablet aspirin 81 mg chewable tablet  Chew 1 tablet every day by oral route.    . cyclobenzaprine (FLEXERIL) 10 MG tablet     . fluticasone (FLONASE) 50 MCG/ACT nasal spray Place 1 spray into  both nostrils daily as needed for allergies. Reported on 11/12/2015    . ibuprofen (ADVIL,MOTRIN) 600 MG tablet     . letrozole (FEMARA) 2.5 MG tablet Take 1 tablet (2.5 mg total) daily by mouth. 90 tablet 1  . lisinopril (PRINIVIL,ZESTRIL) 40 MG tablet Take 1 tablet (40 mg total) by mouth daily. 90 tablet 0  . pravastatin (PRAVACHOL) 20 MG tablet Take 20 mg by mouth daily.     [name] gabapentin (NEURONTIN) 300 MG capsule TAKE ONE CAPSULE BY MOUTH AT BEDTIME (Patient not taking: Reported on 11/27/2017) 90 capsule 0  . VITAMIN D, ERGOCALCIFEROL, PO Take 2 drops every other day by mouth. 2000 units     No current facility-administered medications for this visit.    Allergies:  Allergies  Allergen Reactions  . Sulfonamide Derivatives Rash    Past Medical History, Surgical history, Social history, and Family History were reviewed and updated.  SMOKING HISTORY:  The patient has smoked intermittently, about 2 packs of cigarettes per week, since she was a teenager, but stopped smoking in 2010.  Review of Systems:  Constitutional:  Negative for fever, chills, anorexia, weight loss, pain. (+) Hot flashes  Cardiovascular: no chest pain or dyspnea on exertion   Respiratory: no cough, shortness of breath, or wheezing  Neurological: no TIA or stroke symptoms Dermatological: negative for rash ENT: negative for - epistaxis, headaches, tinnitus, vertigo or visual changes Skin: Negative. Gastrointestinal: no abdominal pain, change in bowel habits, or black or bloody stools positive for - heartburn Genito-Urinary: no dysuria, trouble voiding, or hematuria Hematological and Lymphatic: negative for - bleeding problems, fatigue, jaundice or pallor Breast: negative for breast lumps   Musculoskeletal: (+) Upper right back sorenes from lypoma (+) moderate joint pain.  Remaining ROS negative.  Physical Exam: Blood pressure (!) 157/74, pulse 76, temperature 98.2 F (36.8 C), temperature source Oral, resp. rate  18, height 5' 4"  (1.626 m), weight 187 lb 8 oz (85 kg), SpO2 98 %. ECOG: 0   General appearance: alert, cooperative, appears stated age, no distress and mildly obese Head: Normocephalic, without obvious abnormality, atraumatic Neck: no adenopathy, supple, symmetrical, trachea midline and thyroid not enlarged, symmetric, no tenderness/mass/nodules HEENT: PERRLA; EOMi; No sclerae icterus. OP clear of masses.  Lymph nodes: Cervical, supraclavicular, and axillary nodes normal. Heart:regular rate and rhythm, S1, S2 normal, no murmur, click, rub or gallop Lung:chest clear, no wheezing, rales, normal symmetric air entry,  +  R sided port-a-cath. Abdomin: soft, non-tender, without masses or organomegaly, normal bowel sounds and surgical scars well healed. EXT:No peripheral edema.  R>L decreased sensation and mildly decreased strength (noted in prior exams) Neuro: R lower extremity weakness and decreased sensation.  Normal gait. Good finger to nose bilaterally. Otherwise no focal deficits. Breasts: Breast inspection showed them to be symmetrical with no nipple discharge. S/p Right breast lumpectomy: (+) The surgical incision in right breast and axilla are well-healed, no tenderness. No palpable mass or adenopathy for either breasts. S/p PAC removal: Scar tissue at port site    Lab Results: CBC Latest Ref Rng & Units 11/27/2017 05/31/2017 01/27/2017  WBC 3.9 - 10.3 K/uL 7.8 8.0 8.7  Hemoglobin 11.6 - 15.9 g/dL 13.9 13.3 14.0  Hematocrit 34.8 - 46.6 % 43.0 41.0 42.9  Platelets 145 - 400 K/uL 205 205 194    CMP Latest Ref Rng & Units 11/27/2017 05/31/2017 01/27/2017  Glucose 70 - 140 mg/dL 102 95 93  BUN 7 - 26 mg/dL 13 11.5 12.8  Creatinine 0.60 - 1.10 mg/dL 0.88 0.9 0.9  Sodium 136 - 145 mmol/L 143 143 142  Potassium 3.5 - 5.1 mmol/L 4.0 4.2 4.6  Chloride 98 - 109 mmol/L 108 - -  CO2 22 - 29 mmol/L 28 28 28   Calcium 8.4 - 10.4 mg/dL 10.1 10.3 10.6(H)  Total Protein 6.4 - 8.3 g/dL 7.6 7.3 7.4  Total  Bilirubin 0.2 - 1.2 mg/dL 0.7 0.95 0.86  Alkaline Phos 40 - 150 U/L 135 119 122  AST 5 - 34 U/L 25 23 29   ALT 0 - 55 U/L 32 27 32    Results for MARIENA, MEARES (MRN [id number]) as of 11/24/2017 15:13  Ref. Range 09/28/2016 09:49 01/27/2017 12:08 05/31/2017 12:50  CEA ([facility]) Latest Ref Range: 0.00 - 5.00 ng/mL 1.83 1.94 2.55  PENDING for 11/27/17  PATHOLOGY REPORT Diagnosis 04/14/2015 1. Breast, lumpectomy, right - INVASIVE GRADE II DUCTAL CARCINOMA, SPANNING 2.7 CM IN GREATEST DIMENSION. - ASSOCIATED INTERMEDATE GRADE DUCTAL CARCINOMA IN SITU. - LYMPH/VASCULAR INVASION IS IDENTIFIED. - MARGINS ARE NEGATIVE. - SEE ONCOLOGY TEMPLATE. 2. Lymph node, sentinel, biopsy, right axillary - ONE BENIGN LYMPH NODE WITH NO TUMOR SEEN (0/1). 3. Lymph node, sentinel, biopsy, right axillary - ONE BENIGN LYMPH NODE WITH NO TUMOR SEEN (0/1). 4. Lymph node, sentinel, biopsy, right axillary - ONE BENIGN LYMPH NODE WITH NO TUMOR SEEN (0/1). 5. Lymph node, biopsy, right axillary - ONE BENIGN LYMPH NODE WITH NO TUMOR SEEN (0/1). Microscopic Comment 1. BREAST, INVASIVE TUMOR, WITH LYMPH NODES PRESENT Specimen, including laterality and lymph node sampling (sentinel, non-sentinel): Right partial breast with right sentinel lymph node sampling. Procedure: Right breast lumpectomy with right sentinel lymph node biopsies. Histologic type: Invasive ductal carcinoma. Grade: 2. Tubule formation: 3. Nuclear pleomorphism: 2. Mitotic: 1. Tumor size (gross measurement): 2.7 cm. Margins: Invasive, distance to closest margin: 0.2 cm (anterior margin). In-situ, distance to closest margin: At least 0.4 cm (all margins). Lymphovascular invasion: Yes, lymph/vascular invasion is identified. Ductal carcinoma in situ: Yes. Grade: Intermediate grade. Extensive intraductal component: No. Lobular neoplasia: Not identified. Tumor focality: Unifocal. Treatment effect: Not applicable. Extent of tumor: Tumor confined  to breast parenchyma. Skin: Not received. Nipple: Not received. Skeletal muscle: Not received. Lymph nodes Examined: 3 Sentinel. 1 Non-sentinel. 4 Total. Lymph nodes with metastasis: 0. Isolated tumor cells (< 0.2 mm): 0. Micrometastasis: (> 0.2 mm and < 2.0 mm): 0. Macrometastasis: (> 2.0 mm): 0. Extracapsular extension: Not applicable. Breast  prognostic profile: Performed on previous case [phone]: Estrogen receptor: 100%, positive. Progesterone receptor: 80%, positive. Her-2 neu: 1.30 ratio, negative. Ki-67: 15%. Non-neoplastic breast: Unremarkable. TNM: pT2, pN0. Comments: As Her-2 neu was previously negative, this will be repeated on representative tumor from the current specimen, and will be reported in an addendum to follow. (RH:ds 04/15/15) Results: HER2 - NEGATIVE RATIO OF HER2/CEP17 SIGNALS 1.17 AVERAGE HER2 COPY NUMBER PER CELL 2.05  PROCEDURES COLONOSCOPY 11/20/2013 ENDOSCOPIC IMPRESSION: 1. Diminutive sessile polyp was found in the transverse colon; polypectomy was performed with cold forceps 2. There was evidence of a prior colo-colonic surgical anastomosis in the left colon 3. The colon mucosa was otherwise normal - excellent prep RECOMMENDATIONS: Repeat Colonoscopy in 5 years.  Radiological Studies:   Diagnostic Mammogram Bilateral 02/03/17 IMPRESSION: No evidence of malignancy within either breast. Stable postsurgical changes within the right breast. RECOMMENDATION: Bilateral diagnostic mammogram in 1 year.   Bone Density 10/19/16 ASSESSMENT: The BMD measured at Femur Neck Left is 0.987 g/cm2 with a T-score of -0.4.   1. MRI of the head with and without IV contrast on 03/23/2011 showed a solitary enhancing mass in the left parietal lobe with surrounding white matter edema, most compatible with solitary metastatic deposit. The mass lesion measured 2.7 x 2.5 cm.  2. PET scan from 03/25/2011 showed resolution of the previously- identified pulmonary  nodules with no residual hypermetabolic activity noted in the neck, chest, abdomen or pelvis to suggest active malignancy. There was a pericardial effusion and some subsegmental atelectasis in the lower lobes. There were also uterine fibroids and a low-density lesion in the anterior spleen that was stable and not associated with hypermetabolic activity.  3. MRI of the head with and without IV contrast showed Stealth protocol utilized to evaluate left parietal mass. The mass measured 3 x 3 x 2.4 cm with marked surrounding vasogenic edema.  4. MRI of the head with and without IV contrast on 04/08/2011 showed postsurgical changes of tumor removal in the left parietal lobe and a postsurgical hematoma with enhancement. There was 3 mm of midline shift. No other metastatic deposits.  5. MRI of the head with and without IV contrast on 06/28/2011 showed a 3 cm area of restricted diffusion lying within the previous area of  left parietal surgical resection for colon cancer. There was enhancement of the peripheral aspect of the cavity, marked restricted diffusion and increasing vasogenic edema. There was concern about the development of a brain abscess.  6. MRI of the head with and without IV contrast on 07/29/2011 showed left parietal postoperative hematoma was smaller compared with the  prior study. There was no nodular enhancement seen to indicate tumor. No new enhancing lesions were seen. There was significant  improvement in the left parietal edema.  7. Digital screening mammogram was negative on 08/02/2011.  8. An MRI of the head with and without IV contrast on 10/21/2011 showed development of gyriform enhancement circumferentially along the margins of the left parietal postoperative space/hematoma. Marked increase in vasogenic edema was seen. 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 There was further contraction of the hematoma/postoperative space in the  left parietal cortical region, now measuring 17 x 19 mm in transverse diameter as opposed to 22 x 21 mm previously.  Left-to-right shift was 2 mm.  9. Nuclear medicine brain PET-CT scan carried out on 10/27/2011 showed hypermetabolic activity in the high left parietal lobe which corresponds to gyriform enhancement on  comparison MRI from 10/21/2011. This was felt to be most consistent with recurrent  tumor.  10. Chest x-ray, 2 view from 11/04/2011, showed chronic cardiomegaly. Port-A-Cath was in place. No acute disease was present.  11. MRI of the head with and without IV contrast on 01/27/2012 showed findings that were felt to be consistent with progression of disease. This exam was compared with the MRI of 10/21/2011. It was recognized that the patient underwent surgical resection of the mass on 11/10/2011.  There was felt to be on the present exam residual peripheral enhancement of approximately 35 x 40 x 21 mm with infiltration of the cortex and deep white matter. It was felt that the enhancement had progressed, despite removal of the central focus. There was moderate vasogenic  edema throughout the left hemisphere but slightly decreased from the MRI from 10/21/2011. No new lesions were seen. There was mild atrophy with chronic microvascular ischemic change, but no midline shift. Major intracranial vascular structures were patent.  12. MRI of the brain with and without IV contrast on 05/04/2012 showed previous left parietal lobe surgery for resection of tumor and radiation necrosis.  No new abnormalities were noted. 13. Chest x-ray, 2 view, on 07/03/2012 showed enlargement of the cardiac silhouette with some lingular scarring, as compared with the chest x-ray from 11/04/2011. 14. 2-D echocardiogram from 07/12/2012 showed left ventricular ejection fraction of 55-60%.  There was a small to moderate circumferential pericardial effusion with no signs of tamponade.  There was felt to be no significant  change from the 2-D echocardiogram dated 08/13/2009. 15. Digital bilateral screening mammogram on 08/06/2012 was negative. 16. 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  There was no progression or new brain metastasis identified. 17. PET scan from 10/16/2012 showed no specific features to suggest metastatic disease.  There was diffuse uptake throughout the thyroid gland, possibly due to hyperthyroidism. 18. MRI of the brain with and without IV contrast on 03/12/2013 showed residual gyriform enhancement surrounding the area of previous surgical resection of a left parietal metastasis with subsequent resection of radionecrosis. The findings likely represent some residual brain injury, but there is no significant progression of radionecrosis and no new lesions seen. 19. MRI of the brain with and without IV contrast on 11/01/2013 showed Stable posttherapy appearance of the brain since May of 2014. No progression or new brain metastasis identified. 20. CT chest,abdomen and pelvis with contrast. 1. No evidence of thoracic metastasis.  2. Small prevascular lymph node is similar prior. 3. Stable pericardial effusion. 4. Interval increase in the volume presacral lymph node in the upper pelvis. This has progressed slowly from 2011. Recommend attention on follow-up versus restaging FDG PET scan. 5. Stable anastomosis in the descending colon. 21. CT chest, abdomen and pelvis with contrast 07/126/2015. 1. No evidence of local colon cancer recurrence or metastasis within the abdomen or pelvis. 2. Retention of stool at the colocolonic anastomosis in the left colon is unchanged comparison exam. No obstructing lesion identified.  22. Mesenteric lymph node is again demonstrated with no increased size. 4. Stable splenic lesion likely representing a complex cysts or  hemangioma. 5. Stable small pericardial effusion 23. Brain MRI 11/11/2014: stable and satisfactory posttherapy appearance of the brain since 2014 and 2015. No new brain metastasis identified. 24. CT chest, abdomen and pelvis w contrast 02/02/2015: 1. No new or progressive findings to suggest  recurrent colorectal, cancer in the chest, abdomen, or pelvis. 2. 16 mm soft tissue nodule in the medial right breast. Correlation with mammographic history recommended. 3. Fibroid change in the uterus. 4. Stable hypodense lesion in the anterior spleen, compatible with complex cyst or hemangioma. 5. Stable appearance of the trace pericardial effusion. 25. Brain MRI 10/29/2015: Continued stable and satisfactory posttherapy appearance of the brain. No new intracranial abnormality. 26. 02/02/2016 Bilateral diagnostic mammogram showed lumpectomy change in the right breast, no evidence of malignancy.  Impression and Plan: 67 y.o. African-American female, postmenopausal  1. Right breat invasive ductal carcinoma, pT2N0M0, stage IIA, ER+/PR+, HER2-, Oncotype RS 30 -I previously reviewed her surgical pathology findings with her in great details. -I reviewed her Oncotype DX test results. The recurrence score is 30, which predicts 10 year risk of distant recurrence 20% with tamoxifen alone. This is considered intermediate risk, but close to high risk group. -She received 4 cycles of adjuvant chemotherapy TC -She has completed adjuvant breast radiation -she started adjuvant letrozole on 10/13/16, will continue for a total of 5-10 years, likely 7 years if she tolerates well   -We'll continue breast cancer surveillance, including annual mammogram, self exam, and routine follow-up -she has hot flashes and some pain in her left upper arm from letrozole, they are mild and tolerable. I suggest she try ibuprofen and a heating pad for the pain.   -Her residual neuropathy has been stable, I previously suggested she can stop Gabapentin  as her neuropathy has not worsened. -She is close to 3 years since diagnosis.  -She is clinically doing well, physical exam and 01/2017 mammogram are unremarkable, her lab results reviewed with her, CBC and CMP are with in normal limits. There is no evidence of recurrence.  -continue on Letrozole  -f/u in 6 months    2.  HTN -continue medication  -continue monitoring BP at home   3. Stage IV Colon Cancer mets to lung and brain, NED --Chrisie continues to do well with no evidence for disease recurrence.  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   -She is clinically doing very well. Her CEA level has been normal, today's result is still pending  - Brain MRI in 10/2015 was negative for recurrence. Brain MRI in 04/2016 was negative for recurrence. -her last restaging CT scans in 01/2015 showed no evidence of recurrence - she is clinically doing well, physical exam is unremarkable,  CEA has been normal, we'll continue observation. -she is 5 years out of her brain recurrence, follow up with lab CBC, CMP and CEA every 6 months. I do not plan to do another surveillance CT scan  4. Peripheral neuropathy, grade 1, secondary to prior  chemotherapy  -stable and mild  -she is off gabapentin   5. Genetics -Given her positive family and personal history of breast (Mother at age of 61 and cousin) and colon cancer (cousin), she was referred to genetic counselor -Her genetic test was negative  6. Bone health  -She has not had a bone density scan for several years. -We discussed that letrozole may weaken her bone -I encouraged her to exercise regularly -Since her calcium is slightly elevated and she is off calcium and Vit D -Bone density scan in 11/02/16 was normal  -Next scan in 10/2018   She will continue follow-up with her primary care physician for other medical problems  Plan -  Continue letrozole  -Lab and f/u  in 6 months -mammogram in 01/2018, ordered today    I spent 20 minutes counseling the patient face to face. The total time spent in the appointment was 25 minutes. She knows to call us if she has any concern or issues before her next appointment.    Truitt Merle  11/27/2017   This document serves as a record of services personally performed by Truitt Merle, MD. It was created on her behalf by Joslyn Devon, a trained medical scribe. The creation of this record is based on the scribe's personal observations and the provider's statements to them.    I have reviewed the above documentation for accuracy and completeness, and I agree with the above.

## 2017-11-27 ENCOUNTER — Other Ambulatory Visit: Payer: Self-pay

## 2017-11-27 ENCOUNTER — Inpatient Hospital Stay (HOSPITAL_BASED_OUTPATIENT_CLINIC_OR_DEPARTMENT_OTHER): Payer: Medicare HMO | Admitting: Hematology

## 2017-11-27 ENCOUNTER — Inpatient Hospital Stay: Payer: Medicare HMO | Attending: Hematology

## 2017-11-27 ENCOUNTER — Ambulatory Visit
Admission: RE | Admit: 2017-11-27 | Discharge: 2017-11-27 | Disposition: A | Discharge status: Home / Self Care | Payer: Medicare HMO | Source: Ambulatory Visit | Attending: Radiation Oncology | Admitting: Radiation Oncology

## 2017-11-27 ENCOUNTER — Encounter: Payer: Self-pay | Admitting: Radiation Oncology

## 2017-11-27 ENCOUNTER — Encounter: Payer: Self-pay | Admitting: Hematology

## 2017-11-27 ENCOUNTER — Telehealth: Payer: Self-pay | Admitting: Hematology

## 2017-11-27 VITALS — BP 155/91 | HR 82 | Temp 98.6°F | Resp 18 | Ht 64.0 in | Wt 187.0 lb

## 2017-11-27 VITALS — BP 157/74 | HR 76 | Temp 98.2°F | Resp 18 | Ht 64.0 in | Wt 187.5 lb

## 2017-11-27 DIAGNOSIS — Z803 Family history of malignant neoplasm of breast: Secondary | ICD-10-CM | POA: Diagnosis not present

## 2017-11-27 DIAGNOSIS — G620 Drug-induced polyneuropathy: Secondary | ICD-10-CM | POA: Insufficient documentation

## 2017-11-27 DIAGNOSIS — D696 Thrombocytopenia, unspecified: Secondary | ICD-10-CM | POA: Diagnosis not present

## 2017-11-27 DIAGNOSIS — R011 Cardiac murmur, unspecified: Secondary | ICD-10-CM | POA: Insufficient documentation

## 2017-11-27 DIAGNOSIS — Z79811 Long term (current) use of aromatase inhibitors: Secondary | ICD-10-CM

## 2017-11-27 DIAGNOSIS — Z170 Estrogen receptor positive status [ER+]: Secondary | ICD-10-CM | POA: Insufficient documentation

## 2017-11-27 DIAGNOSIS — Z87891 Personal history of nicotine dependence: Secondary | ICD-10-CM | POA: Diagnosis not present

## 2017-11-27 DIAGNOSIS — Z923 Personal history of irradiation: Secondary | ICD-10-CM | POA: Insufficient documentation

## 2017-11-27 DIAGNOSIS — G629 Polyneuropathy, unspecified: Secondary | ICD-10-CM | POA: Insufficient documentation

## 2017-11-27 DIAGNOSIS — I313 Pericardial effusion (noninflammatory): Secondary | ICD-10-CM | POA: Insufficient documentation

## 2017-11-27 DIAGNOSIS — Z85038 Personal history of other malignant neoplasm of large intestine: Secondary | ICD-10-CM

## 2017-11-27 DIAGNOSIS — K219 Gastro-esophageal reflux disease without esophagitis: Secondary | ICD-10-CM | POA: Diagnosis not present

## 2017-11-27 DIAGNOSIS — Z9221 Personal history of antineoplastic chemotherapy: Secondary | ICD-10-CM | POA: Diagnosis not present

## 2017-11-27 DIAGNOSIS — C50211 Malignant neoplasm of upper-inner quadrant of right female breast: Secondary | ICD-10-CM

## 2017-11-27 DIAGNOSIS — Z7982 Long term (current) use of aspirin: Secondary | ICD-10-CM | POA: Insufficient documentation

## 2017-11-27 DIAGNOSIS — Z79899 Other long term (current) drug therapy: Secondary | ICD-10-CM | POA: Diagnosis not present

## 2017-11-27 DIAGNOSIS — Z800 Family history of malignant neoplasm of digestive organs: Secondary | ICD-10-CM | POA: Diagnosis not present

## 2017-11-27 DIAGNOSIS — I10 Essential (primary) hypertension: Secondary | ICD-10-CM | POA: Insufficient documentation

## 2017-11-27 DIAGNOSIS — I739 Peripheral vascular disease, unspecified: Secondary | ICD-10-CM | POA: Insufficient documentation

## 2017-11-27 DIAGNOSIS — Z85841 Personal history of malignant neoplasm of brain: Secondary | ICD-10-CM | POA: Diagnosis not present

## 2017-11-27 DIAGNOSIS — C7949 Secondary malignant neoplasm of other parts of nervous system: Secondary | ICD-10-CM

## 2017-11-27 DIAGNOSIS — C7931 Secondary malignant neoplasm of brain: Secondary | ICD-10-CM

## 2017-11-27 DIAGNOSIS — H65111 Acute and subacute allergic otitis media (mucoid) (sanguinous) (serous), right ear: Secondary | ICD-10-CM | POA: Diagnosis not present

## 2017-11-27 DIAGNOSIS — Z08 Encounter for follow-up examination after completed treatment for malignant neoplasm: Secondary | ICD-10-CM | POA: Diagnosis not present

## 2017-11-27 DIAGNOSIS — C186 Malignant neoplasm of descending colon: Secondary | ICD-10-CM

## 2017-11-27 LAB — CBC WITH DIFFERENTIAL/PLATELET
BASOS PCT: 0 %
Basophils Absolute: 0 10*3/uL (ref 0.0–0.1)
EOS ABS: 0.3 10*3/uL (ref 0.0–0.5)
Eosinophils Relative: 4 %
HCT: 43 % (ref 34.8–46.6)
HEMOGLOBIN: 13.9 g/dL (ref 11.6–15.9)
Lymphocytes Relative: 21 %
Lymphs Abs: 1.6 10*3/uL (ref 0.9–3.3)
MCH: 27.6 pg (ref 25.1–34.0)
MCHC: 32.3 g/dL (ref 31.5–36.0)
MCV: 85.5 fL (ref 79.5–101.0)
Monocytes Absolute: 0.5 10*3/uL (ref 0.1–0.9)
Monocytes Relative: 7 %
NEUTROS PCT: 68 %
Neutro Abs: 5.4 10*3/uL (ref 1.5–6.5)
Platelets: 205 10*3/uL (ref 145–400)
RBC: 5.03 MIL/uL (ref 3.70–5.45)
RDW: 15 % — ABNORMAL HIGH (ref 11.2–14.5)
WBC: 7.8 10*3/uL (ref 3.9–10.3)

## 2017-11-27 LAB — CEA (IN HOUSE-CHCC): CEA (CHCC-In House): 2.04 ng/mL (ref 0.00–5.00)

## 2017-11-27 LAB — COMPREHENSIVE METABOLIC PANEL
ALBUMIN: 3.9 g/dL (ref 3.5–5.0)
ALT: 32 U/L (ref 0–55)
ANION GAP: 7 (ref 3–11)
AST: 25 U/L (ref 5–34)
Alkaline Phosphatase: 135 U/L (ref 40–150)
BUN: 13 mg/dL (ref 7–26)
CO2: 28 mmol/L (ref 22–29)
Calcium: 10.1 mg/dL (ref 8.4–10.4)
Chloride: 108 mmol/L (ref 98–109)
Creatinine, Ser: 0.88 mg/dL (ref 0.60–1.10)
GFR calc Af Amer: >60 mL/min (ref >60)
GFR calc non Af Amer: >60 mL/min (ref >60)
GLUCOSE: 102 mg/dL (ref 70–140)
POTASSIUM: 4 mmol/L (ref 3.5–5.1)
SODIUM: 143 mmol/L (ref 136–145)
Total Bilirubin: 0.7 mg/dL (ref 0.2–1.2)
Total Protein: 7.6 g/dL (ref 6.4–8.3)

## 2017-11-27 NOTE — Addendum Note (Signed)
Encounter addended by: Cori Razor, RN on: 11/27/2017 10:44 AM  Actions taken: Vitals modified

## 2017-11-27 NOTE — Progress Notes (Addendum)
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 breast [facility])   Staging form: Breast, AJCC 7th Edition     Clinical: Stage IIA (T2, N0, M0) - Unsigned     Pathologic stage from 04/14/2015: Stage IIA (T2, N0, cM0) - Signed by Truitt Merle, MD on 05/05/2015 Cancer of left colon [facility])   Staging form: Colon and Rectum, AJCC 7th Edition     Pathologic: T4a, N1a, M1 - Unsigned  Interval Since Last Radiation: 2 years, 3 months  08/17/15 - 08/24/15: Right breast treated to 42.5 Gy in 17 fractions, Right breast boost treated to 7.5 Gy in 3 fractions  04/15/11: 17 Gy to left parietal target in 1 fraction  Narrative:  The patient returns today for routine follow-up. She continues on antiestrogen therapy for management of her breast cancer, and has been followed in surveillance for her history of colon cancer. She is due to see Dr. Burr Medico today as well. She has done very well since completion of her brain treatment in 2012, and is not able to have insurance coverage for MRIs unless she becomes symptomatic.   On review of systems, the patient reports that she is doing well overall. She denies any chest pain, shortness of breath, cough, fevers, chills, night sweats, unintended weight changes. She denies any bowel or bladder disturbances, and denies abdominal pain, nausea or vomiting. She denies any headaches, visual or auditory changes. She denies any changes in her gait. She is not having any concerns with sinus pressure but feels on the right side, that she's underwater. She has had prior eustachian tube dysfunction, and has used OTC sudafed. She denies any new musculoskeletal or joint aches or pains, new skin lesions or concerns. A complete review of systems is obtained and is  otherwise negative.   Past Medical History:  Past Medical History:  Diagnosis Date  . Allergy    sulfa  . Arthritis   . Blood transfusion   . 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 Colon cancer ([place])    colon/ 2010/surg/chemo  . Diverticulosis 07/22/2010   sigmoid colon  . Family history of breast cancer   . Family history of colon cancer   . GERD (gastroesophageal reflux disease)   . Headache(784.0)   . Heart murmur   . History of radiation therapy 04/15/11   17 Gy single fraction  l parietal brain metastais  . Hypertension   . LVH (left ventricular hypertrophy)    mod/severe. echo 1/11. EF 65-70%   . Neuromuscular disorder ([place])    peripheral neuropathy feet  . Pericardial effusion    echo 08/13/09  . Peripheral vascular disease ([place])   . Personal history of chemotherapy   . Personal history of radiation therapy   . S/P radiation therapy 08/18/15-09/14/15   right breast  . Shortness of breath   . Thrombocytopenia ([place])     Past Surgical History: Past Surgical History:  Procedure Laterality Date  . BREAST LUMPECTOMY Right 04/14/2015  . BREAST LUMPECTOMY WITH RADIOACTIVE SEED AND SENTINEL LYMPH NODE BIOPSY Right 04/14/2015   Procedure: RIGHT BREAST LUMPECTOMY WITH RADIOACTIVE SEED ANDAXILLARY SENTINEL LYMPH NODE BIOPSY;  Surgeon: Jackolyn Confer, MD;  Location: [facility];  Service: General;  Laterality: Right;  . COLON SURGERY    . COLONOSCOPY  07/22/2010  . COLOSTOMY CLOSURE    . CRANIOTOMY  03/31/11   left parietal mass resection  . CRANIOTOMY  11/10/2011   Procedure: CRANIOTOMY TUMOR EXCISION;  Surgeon: Erline Levine, MD;  Location: [facility];  Service: Neurosurgery;  Laterality: N/A;  Craniotomy for Biopsy of Tumor  . PORTACATH PLACEMENT     10  . ROTATOR CUFF REPAIR     rt  . TONSILLECTOMY    . TONSILLECTOMY    . TUBAL LIGATION      Social History:  Social History   Socioeconomic History  . Marital status: Divorced     Spouse name: Not on file  . Number of children: Not on file  . Years of education: Not on file  . Highest education level: Not on file  Occupational History  . Not on file  Social Needs  . Financial resource strain: Not on file  . Food insecurity:    Worry: Not on file    Inability: Not on file  . Transportation needs:    Medical: Not on file    Non-medical: Not on file  Tobacco Use  . Smoking status: Former Smoker    Packs/day: 0.25    Years: 37.00    Pack years: 9.25    Types: Cigarettes    Last attempt to quit: 11/03/2008    Years since quitting: 9.0  . Smokeless tobacco: Never Used  . Tobacco comment: 30 pack year hx   Substance and Sexual Activity  . Alcohol use: No    Comment: rarely  . Drug use: No  . Sexual activity: Yes    Birth control/protection: Post-menopausal  Lifestyle  . Physical activity:    Days per week: Not on file    Minutes per session: Not on file  . Stress: Not on file  Relationships  . Social connections:    Talks on phone: Not on file    Gets together: Not on file    Attends religious service: Not on file    Active member of club or organization: Not on file    Attends meetings of clubs or organizations: Not on file    Relationship status: Not on file  . Intimate partner violence:    Fear of current or ex partner: Not on file    Emotionally abused: Not on file    Physically abused: Not on file    Forced sexual activity: Not on file  Other Topics Concern  . Not on file  Social History Narrative   Full time- loader on truck    Single, 2 sons- 54 and 76.     Family History: Family History  Problem Relation Age of Onset  . Breast cancer Mother 20  . Diabetes Father   . Coronary artery disease Father   . Gout Brother   . Breast cancer Cousin        dx in her 74s  . Colon cancer Cousin        mother's maternal first cousin  . Diabetes Maternal Aunt   . Cirrhosis Brother     ALLERGIES:  is allergic to sulfonamide  derivatives.  Meds: Current Outpatient Medications  Medication Sig Dispense Refill  . acetaminophen (TYLENOL) 500 MG tablet Take 1,000 mg by mouth every 6 (six) hours as needed for mild pain, moderate pain or headache. Reported on 09/30/2015    . fluticasone (FLONASE) 50 MCG/ACT nasal spray Place 1  spray into both nostrils daily as needed for allergies. Reported on 11/12/2015    . letrozole (FEMARA) 2.5 MG tablet Take 1 tablet (2.5 mg total) daily by mouth. 90 tablet 1  . lisinopril (PRINIVIL,ZESTRIL) 40 MG tablet Take 1 tablet (40 mg total) by mouth daily. 90 tablet 0  . pravastatin (PRAVACHOL) 20 MG tablet Take 20 mg by mouth daily.     [name] aspirin 81 MG chewable tablet aspirin 81 mg chewable tablet  Chew 1 tablet every day by oral route.    . cyclobenzaprine (FLEXERIL) 10 MG tablet     . gabapentin (NEURONTIN) 300 MG capsule TAKE ONE CAPSULE BY MOUTH AT BEDTIME (Patient not taking: Reported on 11/27/2017) 90 capsule 0  . ibuprofen (ADVIL,MOTRIN) 600 MG tablet     . VITAMIN D, ERGOCALCIFEROL, PO Take 2 drops every other day by mouth. 2000 units     No current facility-administered medications for this encounter.     Physical Findings: Wt Readings from Last 3 Encounters:  05/31/17 189 lb 9.6 oz (86 kg)  01/27/17 189 lb 11.2 oz (86 kg)  10/31/16 191 lb (86.6 kg)   Temp Readings from Last 3 Encounters:  05/31/17 98.4 F (36.9 C) (Oral)  01/27/17 98 F (36.7 C) (Oral)  10/31/16 97.9 F (36.6 C) (Oral)   BP Readings from Last 3 Encounters:  05/31/17 (!) 156/87  01/27/17 (!) 147/74  10/31/16 125/63   Pulse Readings from Last 3 Encounters:  05/31/17 77  01/27/17 79  10/31/16 (!) 104  vitals stable and recorded by nursing, not yet put in chart for today's visit.  In general this is a well appearing African American female in no acute distress. She's alert and oriented x4 and appropriate throughout the examination. Cardiopulmonary assessment is negative for acute distress and she  exhibits normal effort. Evaluation of her ears bilaterally reveal normal-appearing external auditory canals however posterior to her right TM, she does have serous fluid without purulent changes. No erythema is noted. Persistence of bubbles are noted in the fluid again.   Lab Findings: Lab Results  Component Value Date   WBC 8.0 05/31/2017   HGB 13.3 05/31/2017   HCT 41.0 05/31/2017   MCV 84.2 05/31/2017   PLT 205 05/31/2017     Radiographic Findings: No results found.  Impression/Plan: 1. 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 The patient is doing great. She is clinically NED and will follow up with Dr. Burr Medico today. We will plan to see her in 1 year, or sooner if she become symptomatic.  2. 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 She also continues to be followed with Dr. Burr Medico regarding her breast. We will follow her progress expectantly. 3. Possible eustachian tube dysfunction. This is again noted, and she is going to try OTC decongestants. She is aware of issues related to blood pressure when taking these medications. We will follow this expectantly but if this remains more of a chronic issue, we will set her up to meet with ENT.      Carola Rhine, PAC

## 2017-11-27 NOTE — Telephone Encounter (Signed)
Scheduled appt per 5/13 los - Gave patient AVS and calender per los.  

## 2017-11-29 ENCOUNTER — Other Ambulatory Visit: Payer: Medicare HMO

## 2017-11-29 ENCOUNTER — Ambulatory Visit: Payer: Medicare HMO | Admitting: Hematology

## 2017-12-05 ENCOUNTER — Other Ambulatory Visit: Payer: Self-pay | Admitting: Hematology

## 2017-12-05 ENCOUNTER — Other Ambulatory Visit: Payer: Self-pay | Admitting: Surgery

## 2017-12-05 DIAGNOSIS — Z170 Estrogen receptor positive status [ER+]: Secondary | ICD-10-CM

## 2017-12-05 DIAGNOSIS — C50211 Malignant neoplasm of upper-inner quadrant of right female breast: Secondary | ICD-10-CM

## 2017-12-05 DIAGNOSIS — D171 Benign lipomatous neoplasm of skin and subcutaneous tissue of trunk: Secondary | ICD-10-CM | POA: Diagnosis not present

## 2017-12-07 ENCOUNTER — Other Ambulatory Visit: Payer: Medicare HMO

## 2017-12-07 ENCOUNTER — Ambulatory Visit: Payer: Medicare HMO | Admitting: Hematology

## 2018-02-05 ENCOUNTER — Ambulatory Visit
Admission: RE | Admit: 2018-02-05 | Discharge: 2018-02-05 | Disposition: A | Discharge status: Home / Self Care | Payer: Medicare HMO | Source: Ambulatory Visit | Attending: Hematology | Admitting: Hematology

## 2018-02-05 DIAGNOSIS — R928 Other abnormal and inconclusive findings on diagnostic imaging of breast: Secondary | ICD-10-CM | POA: Diagnosis not present

## 2018-02-05 DIAGNOSIS — Z170 Estrogen receptor positive status [ER+]: Secondary | ICD-10-CM

## 2018-02-05 DIAGNOSIS — Z853 Personal history of malignant neoplasm of breast: Secondary | ICD-10-CM | POA: Diagnosis not present

## 2018-02-05 DIAGNOSIS — C50211 Malignant neoplasm of upper-inner quadrant of right female breast: Secondary | ICD-10-CM

## 2018-02-17 ENCOUNTER — Other Ambulatory Visit: Payer: Self-pay | Admitting: Nurse Practitioner

## 2018-03-30 DIAGNOSIS — G8929 Other chronic pain: Secondary | ICD-10-CM | POA: Diagnosis not present

## 2018-03-30 DIAGNOSIS — E785 Hyperlipidemia, unspecified: Secondary | ICD-10-CM | POA: Diagnosis not present

## 2018-03-30 DIAGNOSIS — E669 Obesity, unspecified: Secondary | ICD-10-CM | POA: Diagnosis not present

## 2018-03-30 DIAGNOSIS — I509 Heart failure, unspecified: Secondary | ICD-10-CM | POA: Diagnosis not present

## 2018-03-30 DIAGNOSIS — M1990 Unspecified osteoarthritis, unspecified site: Secondary | ICD-10-CM | POA: Diagnosis not present

## 2018-03-30 DIAGNOSIS — G629 Polyneuropathy, unspecified: Secondary | ICD-10-CM | POA: Diagnosis not present

## 2018-03-30 DIAGNOSIS — Z6831 Body mass index (BMI) 31.0-31.9, adult: Secondary | ICD-10-CM | POA: Diagnosis not present

## 2018-03-30 DIAGNOSIS — C50919 Malignant neoplasm of unspecified site of unspecified female breast: Secondary | ICD-10-CM | POA: Diagnosis not present

## 2018-03-30 DIAGNOSIS — I110 Hypertensive heart disease with heart failure: Secondary | ICD-10-CM | POA: Diagnosis not present

## 2018-03-30 DIAGNOSIS — I739 Peripheral vascular disease, unspecified: Secondary | ICD-10-CM | POA: Diagnosis not present

## 2018-05-14 ENCOUNTER — Other Ambulatory Visit: Payer: Self-pay | Admitting: Hematology

## 2018-05-14 DIAGNOSIS — Z170 Estrogen receptor positive status [ER+]: Secondary | ICD-10-CM

## 2018-05-14 DIAGNOSIS — C50211 Malignant neoplasm of upper-inner quadrant of right female breast: Secondary | ICD-10-CM

## 2018-05-29 NOTE — Progress Notes (Signed)
Hematology and Oncology Follow Up Visit Date of Visit: 05/30/2018   Kayla [name] [id number] January 26, 1951 67 y.o.   Date of Service:  05/30/2018   PCP: [facility] [phone])  Principle Diagnosis:  1. Colon cancer, T4N1M1, Stage IV, diagnosed on 10/27/2008, brain recurrence in 03/2011  2. Stage IIA right breast cancer, diagnosed in 02/2015  Oncology History   Breast cancer of upper-inner quadrant of right female breast [facility])   Staging form: Breast, AJCC 7th Edition     Clinical: Stage IIA (T2, N0, M0) - Unsigned     Pathologic stage from 04/14/2015: Stage IIA (T2, N0, cM0) - Signed by Truitt Merle, MD on 05/05/2015 Cancer of left colon [facility])   Staging form: Colon and Rectum, AJCC 7th Edition     Pathologic: T4a, N1a, M1 - Unsigned        Breast cancer of upper-inner quadrant of right female breast ([place])   02/27/2015 Mammogram    Diagnostic mammogram showed a 2.5 cm irregular mass within the far posterior upper inner right breast, ultrasound confirmed a 1.9 x 1.0 x 0.8 cm mass at 1:30 o'clock 15 submitted from nipple. No axillary adenopathy    03/03/2015 Initial Biopsy    Right breast needle biopsy showed invasive ductal carcinoma, grade 1-2.    03/03/2015 Receptors her2    ER 100%+, PR 80%+, ki67 15%, HER2/neu negative    03/03/2015 Clinical Stage    Stage IIA: T2 N0    03/18/2015 Procedure    VUS at POLD1 gene called c.327G>C. neg at APC, ATM, AXIN2, BARD1, BMPR1A, BRCA1, BRCA2, BRIP1, CDH1, CDK4, CDKN2A, CHEK2, EPCAM, FANCC, MLH1, MSH2, MSH6, MUTYH, NBN, PALB2, PMS2, POLD1, POLE, PTEN, RAD51C, RAD51D, SCG5/GREM1, SMAD4, STK11, TP53, VHL,XRCC2    04/14/2015 Surgery    Right breast lumpectomy and sentinel lymph node biopsy. Surgical margins were negative.    04/14/2015 Pathology Results    right breast invasive ductal carcinoma, grade 2, 2.7 cm,(+) DCIS, margins were negative, 4 sentinel lymph nodes and one axillary lymph nodes were negative,  (+)lymphovascular invasion.    04/14/2015 Pathologic Stage    Stage IIA: T2 N0    04/14/2015 Oncotype testing    RS 30, which predicts 10-year risk of distance recurrence with tamoxifen alone 20%, intermediate risk    05/21/2015 - 07/24/2015 Adjuvant Chemotherapy     Adjuvant chemotherapy with docetaxel 75 mg/m, and Cytoxan 600 mg/m X 4 cycles    05/26/2015 - 05/29/2015 Hospital Admission    Pt was admitted for UTI and syncope, treated with antibiotics and IVF     08/17/2015 - 09/13/2015 Radiation Therapy    Adjuvant RT: Right breast treated to 42.5 Gy in 17 fractions, Right breast boost treated to 7.5 Gy in 3 fractions    10/14/2015 -  Anti-estrogen oral therapy    Letrozole 2.5 mg daily    11/13/2015 Survivorship    Survivorship visit completed    02/02/2016 Mammogram    02/02/2016 Bilateral diagnostic mammogram showed lumpectomy change in the right breast, no evidence of malignancy.    10/19/2016 Imaging    Bone Density 10/19/16 ASSESSMENT: The BMD measured at Femur Neck Left is 0.987 g/cm2 with a T-score of -0.4.     02/03/2017 Mammogram    Diagnostic Mammogram Bilateral 02/03/17 IMPRESSION: No evidence of malignancy within either breast. Stable postsurgical changes within the right breast. RECOMMENDATION: Bilateral diagnostic mammogram in 1 year.    02/05/2018 Mammogram    02/05/2018 Mammogram IMPRESSION: 1. No mammographic evidence of  malignancy in either breast. 2. Stable right breast posttreatment changes.    Oncology History: Diagnosis of colon cancer dates back to 10/27/2008 when Kayla [name] presented with what turned out to be a bowel perforation through tumor. Stage at that time was T4 N1 M1 with pulmonary metastatic disease. The K-ras mutation was detected. Kayla [name] underwent surgical resection of her tumor with a colostomy at the time of her surgery on 10/27/2008. The colostomy has been subsequently reversed on 09/20/2010. Kayla [name] received chemotherapy consisting of FOLFOX  for 10 treatments from 12/10/2008 through 06/02/2009. She achieved a partial remission from these treatments. She then received additional chemotherapy with 5-FU, leucovorin, and 5-FU by continuous infusion along with Avastin from 06/23/2009 through 11/17/2009. 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 She was doing well without evidence of disease until she developed a right hemiparesthesia in September 2012 and was found to have a 3 x 3 x 2.4 cm, lobulated, enhancing mass in the left parietal lobe with marked surrounding edema. A PET scan on 03/25/2011 showed resolution of the previously identified pulmonary nodules with no residual hypermetabolic activity. The pericardial effusion, which has been present since diagnosis, was unchanged. There were also uterine fibroids and a low-density lesion in the anterior spleen that was stable and not associated with any hypermetabolic activity. Of note, Kayla [name] had a markedly elevated CEA up to 24.7 on 03/23/2011. On 06/23/2011, the CEA was less than 0.5. Dr. Erline Levine resected the recurrent metastatic colon cancer on 03/31/2011. This was followed by stereotactic radiation on 04/15/2011. The patient underwent a left-sided craniotomy and excision of the mass involving her left parietal lobe on 11/10/2011 by Dr. Erline Levine. The pathology report was negative for any malignancy. Pathology report indicated benign brain with fibrosis, hemorrhage, hemosiderin deposition, abundant dystrophic calcifications, and mixed acute and chronic inflammation including foreign body multinucleated giant cells.    Prior Therapy:   1. Kayla [name] underwent surgical resection of her tumor with a colostomy at the time of her surgery on 10/27/2008. The colostomy has been subsequently reversed on 09/20/2010. Kayla [name] received chemotherapy consisting of FOLFOX for 10 treatments from 12/10/2008 through 06/02/2009. She achieved a partial remission from these treatments.  She then received additional chemotherapy with 5-FU, leucovorin, and 5-FU by continuous infusion along with Avastin from 06/23/2009 through 11/17/2009. 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  2. See above for her breast cancer treatment history   Current therapy:  Adjuvant letrozole 2.46m daily started on 10/14/2015  Interim History:  Kayla HARBECKreturns for follow-up. She was last seen by me 6 months ago. Her last mammogram was benign. Today, she is here alone. She is doing well and has no new complaints. She denies pain. She complains of mild nasal congestion. Her right leg neuropathy still bothers her, but she denies falling. She says that her right leg is numb. She has hot flashes that are overall tolerable. She says that vitamin D makes her constipated.   [place]   Past Medical History:  Diagnosis Date  . Allergy    sulfa  . Arthritis   . Blood transfusion   . 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 [place] cancer [place]    colon/ 2010/surg/chemo  . Diverticulosis 07/22/2010   sigmoid colon  . Family history of breast cancer   . Family history of colon cancer   .  GERD (gastroesophageal reflux disease)   . Headache(784.0)   . Heart murmur   . History of radiation therapy 04/15/11   17 Gy single fraction  l parietal brain metastais  . Hypertension   . LVH (left ventricular hypertrophy)    mod/severe. echo 1/11. EF 65-70%   . Neuromuscular disorder ([place])    peripheral neuropathy feet  . Pericardial effusion    echo 08/13/09  . Peripheral vascular disease ([place])   . Personal history of chemotherapy   . Personal history of radiation therapy   . S/P radiation therapy 08/18/15-09/14/15   right breast  . Shortness of breath   . Thrombocytopenia ([place])    GYN HISTORY  Menarchal: 14 LMP: 54 Contraceptive: a few years  HRT: no  G2P2:    Medications: I  have reviewed the patient's current medications.  Current Outpatient Medications  Medication Sig Dispense Refill  . acetaminophen (TYLENOL) 500 MG tablet Take 1,000 mg by mouth every 6 (six) hours as needed for mild pain, moderate pain or headache. Reported on 09/30/2015    . aspirin 81 MG chewable tablet aspirin 81 mg chewable tablet  Chew 1 tablet every day by oral route.    . fluticasone (FLONASE) 50 MCG/ACT nasal spray Place 1 spray into both nostrils daily as needed for allergies. Reported on 11/12/2015    . ibuprofen (ADVIL,MOTRIN) 600 MG tablet     . letrozole (FEMARA) 2.5 MG tablet TAKE 1 TABLET (2.5 MG) BY MOUTH ONCE DAILY 90 tablet 1  . lisinopril (PRINIVIL,ZESTRIL) 40 MG tablet Take 1 tablet (40 mg total) by mouth daily. 90 tablet 0  . pravastatin (PRAVACHOL) 20 MG tablet Take 20 mg by mouth daily.     . cyclobenzaprine (FLEXERIL) 10 MG tablet     . gabapentin (NEURONTIN) 300 MG capsule TAKE ONE CAPSULE BY MOUTH AT BEDTIME (Patient not taking: Reported on 11/27/2017) 90 capsule 0  . VITAMIN D, ERGOCALCIFEROL, PO Take 2 drops every other day by mouth. 2000 units     No current facility-administered medications for this visit.    Allergies:  Allergies  Allergen Reactions  . Sulfonamide Derivatives Rash    Past Medical History, Surgical history, Social history, and Family History were reviewed and updated.  SMOKING HISTORY:  The patient has smoked intermittently, about 2 packs of cigarettes per week, since she was a teenager, but stopped smoking in 2010.   REVIEW OF SYSTEMS:   Constitutional: Denies fevers, chills or abnormal night sweats (+) hot flashes  Eyes: Denies blurriness of vision, double vision or watery eyes Ears, nose, mouth, throat, and face: Denies mucositis or sore throat Respiratory: Denies cough, dyspnea or wheezes Cardiovascular: Denies palpitation, chest discomfort or lower extremity swelling Gastrointestinal:  Denies nausea, heartburn or change in bowel  habits Skin: Denies abnormal skin rashes Lymphatics: Denies new lymphadenopathy or easy bruising Neurological: (+) right leg numbness Behavioral/Psych: Mood is stable, no new changes  All other systems were reviewed with the patient and are negative.  Physical Exam: Blood pressure (!) 148/81, pulse 82, temperature 98.1 F (36.7 C), temperature source Oral, resp. rate 18, height _0  (1.626 m), weight 187 lb 1.6 oz (84.9 kg), SpO2 99 %. ECOG: 0  GENERAL:alert, no distress and comfortable SKIN: skin color, texture, turgor are normal, no rashes or significant lesions EYES: normal, conjunctiva are pink and non-injected, sclera clear OROPHARYNX:no exudate, no erythema and lips, buccal mucosa, and tongue normal  NECK: supple, thyroid normal size, non-tender, without nodularity LYMPH:  no  palpable lymphadenopathy in the cervical, axillary or inguinal LUNGS: clear to auscultation and percussion with normal breathing effort HEART: regular rate & rhythm and no murmurs and no lower extremity edema ABDOMEN:abdomen soft, non-tender and normal bowel sounds Musculoskeletal:no cyanosis of digits and no clubbing  PSYCH: alert & oriented x 3 with fluent speech NEURO: no focal motor/sensory deficits (+) decreased right hand and leg sense to vibration  Breast: no new palpable masses or skin changes (+) area under her breast is moist with mild skin erythema    Lab Results: CBC Latest Ref Rng & Units 05/30/2018 11/27/2017 05/31/2017  WBC 4.0 - 10.5 K/uL 8.1 7.8 8.0  Hemoglobin 12.0 - 15.0 g/dL 13.6 13.9 13.3  Hematocrit 36.0 - 46.0 % 43.1 43.0 41.0  Platelets 150 - 400 K/uL 230 205 205    CMP Latest Ref Rng & Units 05/30/2018 11/27/2017 05/31/2017  Glucose 70 - 99 mg/dL 104(H) 102 95  BUN 8 - 23 mg/dL 16 13 11.5  Creatinine 0.44 - 1.00 mg/dL 0.80 0.88 0.9  Sodium 135 - 145 mmol/L 142 143 143  Potassium 3.5 - 5.1 mmol/L 4.1 4.0 4.2  Chloride 98 - 111 mmol/L 106 108 -  CO2 22 - 32 mmol/L _0 Calcium 8.9 - 10.3 mg/dL 9.8 10.1 10.3  Total Protein 6.5 - 8.1 g/dL 7.4 7.6 7.3  Total Bilirubin 0.3 - 1.2 mg/dL 0.8 0.7 0.95  Alkaline Phos 38 - 126 U/L 134(H) 135 119  AST 15 - 41 U/L _1 ALT 0 - 44 U/L 24 32 27   Tumor Marker CEA 06/29/16: 2.48 09/28/16: 1.83 01/27/17: 1.94 05/31/17: 2.55 11/27/17: 2.04   PATHOLOGY REPORT Diagnosis 04/14/2015 1. Breast, lumpectomy, right - INVASIVE GRADE II DUCTAL CARCINOMA, SPANNING 2.7 CM IN GREATEST DIMENSION. - ASSOCIATED INTERMEDATE GRADE DUCTAL CARCINOMA IN SITU. - LYMPH/VASCULAR INVASION IS IDENTIFIED. - MARGINS ARE NEGATIVE. - SEE ONCOLOGY TEMPLATE. 2. Lymph node, sentinel, biopsy, right axillary - ONE BENIGN LYMPH NODE WITH NO TUMOR SEEN (0/1). 3. Lymph node, sentinel, biopsy, right axillary - ONE BENIGN LYMPH NODE WITH NO TUMOR SEEN (0/1). 4. Lymph node, sentinel, biopsy, right axillary - ONE BENIGN LYMPH NODE WITH NO TUMOR SEEN (0/1). 5. Lymph node, biopsy, right axillary - ONE BENIGN LYMPH NODE WITH NO TUMOR SEEN (0/1). Microscopic Comment 1. BREAST, INVASIVE TUMOR, WITH LYMPH NODES PRESENT Specimen, including laterality and lymph node sampling (sentinel, non-sentinel): Right partial breast with right sentinel lymph node sampling. Procedure: Right breast lumpectomy with right sentinel lymph node biopsies. Histologic type: Invasive ductal carcinoma. Grade: 2. Tubule formation: 3. Nuclear pleomorphism: 2. Mitotic: 1. Tumor size (gross measurement): 2.7 cm. Margins: Invasive, distance to closest margin: 0.2 cm (anterior margin). In-situ, distance to closest margin: At least 0.4 cm (all margins). Lymphovascular invasion: Yes, lymph/vascular invasion is identified. Ductal carcinoma in situ: Yes. Grade: Intermediate grade. Extensive intraductal component: No. Lobular neoplasia: Not identified. Tumor focality: Unifocal. Treatment effect: Not applicable. Extent of tumor: Tumor confined to breast parenchyma. Skin: Not  received. Nipple: Not received. Skeletal muscle: Not received. Lymph nodes Examined: 3 Sentinel. 1 Non-sentinel. 4 Total. Lymph nodes with metastasis: 0. Isolated tumor cells (< 0.2 mm): 0. Micrometastasis: (> 0.2 mm and < 2.0 mm): 0. Macrometastasis: (> 2.0 mm): 0. Extracapsular extension: Not applicable. Breast prognostic profile: Performed on previous case [id number]: Estrogen receptor: 100%, positive. Progesterone receptor: 80%, positive. Her-2 neu: 1.30 ratio, negative. Ki-67: 15%. Non-neoplastic breast: Unremarkable. TNM: pT2, pN0. Comments: As Her-2 neu was previously  negative, this will be repeated on representative tumor from the current specimen, and will be reported in an addendum to follow. (RH:ds 04/15/15) Results: HER2 - NEGATIVE RATIO OF HER2/CEP17 SIGNALS 1.17 AVERAGE HER2 COPY NUMBER PER CELL 2.05  PROCEDURES COLONOSCOPY 11/20/2013 ENDOSCOPIC IMPRESSION: 1. Diminutive sessile polyp was found in the transverse colon; polypectomy was performed with cold forceps 2. There was evidence of a prior colo-colonic surgical anastomosis in the left colon 3. The colon mucosa was otherwise normal - excellent prep RECOMMENDATIONS: Repeat Colonoscopy in 5 years.  Radiological Studies:  02/05/2018 Mammogram IMPRESSION: 1. No mammographic evidence of malignancy in either breast. 2. Stable right breast posttreatment changes.  Diagnostic Mammogram Bilateral 02/03/17 IMPRESSION: No evidence of malignancy within either breast. Stable postsurgical changes within the right breast. RECOMMENDATION: Bilateral diagnostic mammogram in 1 year.   Bone Density 10/19/16 ASSESSMENT: The BMD measured at Femur Neck Left is 0.987 g/cm2 with a T-score of -0.4.   1. MRI of the head with and without IV contrast on 03/23/2011 showed a solitary enhancing mass in the left parietal lobe with surrounding white matter edema, most compatible with solitary metastatic deposit. The mass lesion  measured 2.7 x 2.5 cm.  2. PET scan from 03/25/2011 showed resolution of the previously- identified pulmonary nodules with no residual hypermetabolic activity noted in the neck, chest, abdomen or pelvis to suggest active malignancy. There was a pericardial effusion and some subsegmental atelectasis in the lower lobes. There were also uterine fibroids and a low-density lesion in the anterior spleen that was stable and not associated with hypermetabolic activity.  3. MRI of the head with and without IV contrast showed Stealth protocol utilized to evaluate left parietal mass. The mass measured 3 x 3 x 2.4 cm with marked surrounding vasogenic edema.  4. MRI of the head with and without IV contrast on 04/08/2011 showed postsurgical changes of tumor removal in the left parietal lobe and a postsurgical hematoma with enhancement. There was 3 mm of midline shift. No other metastatic deposits.  5. MRI of the head with and without IV contrast on 06/28/2011 showed a 3 cm area of restricted diffusion lying within the previous area of  left parietal surgical resection for colon cancer. There was enhancement of the peripheral aspect of the cavity, marked restricted diffusion and increasing vasogenic edema. There was concern about the development of a brain abscess.  6. MRI of the head with and without IV contrast on 07/29/2011 showed left parietal postoperative hematoma was smaller compared with the  prior study. There was no nodular enhancement seen to indicate tumor. No new enhancing lesions were seen. There was significant  improvement in the left parietal edema.  7. Digital screening mammogram was negative on 08/02/2011.  8. An MRI of the head with and without IV contrast on 10/21/2011 showed development of gyriform enhancement circumferentially along the margins of the left parietal postoperative space/hematoma. Marked increase in vasogenic edema was seen. 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 There was further contraction of the hematoma/postoperative space in the left parietal cortical region, now measuring 17 x 19 mm in transverse diameter as opposed to 22 x 21 mm previously.  Left-to-right shift was 2 mm.  9. Nuclear medicine brain PET-CT scan carried out on 10/27/2011 showed hypermetabolic activity in the high left parietal lobe which corresponds to gyriform enhancement on comparison MRI from 10/21/2011. This was felt to be most consistent with recurrent  tumor.  10. Chest x-ray, 2 view from 11/04/2011, showed chronic cardiomegaly. Port-A-Cath was in place. No acute disease was present.  11. MRI of the head with and without IV contrast on 01/27/2012 showed findings that were felt to be consistent with progression of disease. This exam was compared with the MRI of 10/21/2011. It was recognized that the patient underwent surgical resection of the mass on 11/10/2011.  There was felt to be on the present exam residual peripheral enhancement of approximately 35 x 40 x 21 mm with infiltration of the cortex and deep white matter. It was felt that the enhancement had progressed, despite removal of the central focus. There was moderate vasogenic  edema throughout the left hemisphere but slightly decreased from the MRI from 10/21/2011. No new lesions were seen. There was mild atrophy with chronic microvascular ischemic change, but no midline shift. Major intracranial vascular structures were patent.  12. MRI of the brain with and without IV contrast on 05/04/2012 showed previous left parietal lobe surgery for resection of tumor and radiation necrosis.  No new abnormalities were noted. 13. Chest x-ray, 2 view, on 07/03/2012 showed enlargement of the cardiac silhouette with some lingular scarring, as compared with the chest x-ray from 11/04/2011. 14. 2-D echocardiogram from 07/12/2012 showed left ventricular ejection fraction of 55-60%.  There was a small to  moderate circumferential pericardial effusion with no signs of tamponade.  There was felt to be no significant change from the 2-D echocardiogram dated 08/13/2009. 15. Digital bilateral screening mammogram on 08/06/2012 was negative. 16. 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  There was no progression or new brain metastasis identified. 17. PET scan from 10/16/2012 showed no specific features to suggest metastatic disease.  There was diffuse uptake throughout the thyroid gland, possibly due to hyperthyroidism. 18. MRI of the brain with and without IV contrast on 03/12/2013 showed residual gyriform enhancement surrounding the area of previous surgical resection of a left parietal metastasis with subsequent resection of radionecrosis. The findings likely represent some residual brain injury, but there is no significant progression of radionecrosis and no new lesions seen. 19. MRI of the brain with and without IV contrast on 11/01/2013 showed Stable posttherapy appearance of the brain since May of 2014. No progression or new brain metastasis identified. 20. CT chest,abdomen and pelvis with contrast. 1. No evidence of thoracic metastasis.  2. Small prevascular lymph node is similar prior. 3. Stable pericardial effusion. 4. Interval increase in the volume presacral lymph node in the upper pelvis. This has progressed slowly from 2011. Recommend attention on follow-up versus restaging FDG PET scan. 5. Stable anastomosis in the descending colon. 21. CT chest, abdomen and pelvis with contrast 07/126/2015. 1. No evidence of local colon cancer recurrence or metastasis within the abdomen or pelvis. 2. Retention of stool at the colocolonic anastomosis in the left colon is unchanged comparison exam. No obstructing lesion identified.  22. Mesenteric lymph node is again  demonstrated with no increased size. 4. Stable splenic lesion likely representing a complex cysts or hemangioma. 5. Stable small pericardial effusion 23. Brain MRI 11/11/2014: stable and satisfactory posttherapy appearance of the brain since 2014 and 2015. No new brain metastasis identified. 24. CT chest, abdomen and pelvis w contrast 02/02/2015: 1. No new or progressive findings to suggest recurrent colorectal, cancer in the chest, abdomen, or pelvis. 2. 16 mm soft tissue nodule in  the medial right breast. Correlation with mammographic history recommended. 3. Fibroid change in the uterus. 4. Stable hypodense lesion in the anterior spleen, compatible with complex cyst or hemangioma. 5. Stable appearance of the trace pericardial effusion. 25. Brain MRI 10/29/2015: Continued stable and satisfactory posttherapy appearance of the brain. No new intracranial abnormality. 26. 02/02/2016 Bilateral diagnostic mammogram showed lumpectomy change in the right breast, no evidence of malignancy.  Impression and Plan:  67 y.o. African-American female, postmenopausal  1. Right breat invasive ductal carcinoma, pT2N0M0, stage IIA, ER+/PR+, HER2-, Oncotype RS 30 -I previously reviewed her surgical pathology findings with her in great details. -I reviewed her Oncotype DX test results. The recurrence score is 30, which predicts 10 year risk of distant recurrence 20% with tamoxifen alone. This is considered intermediate risk, but close to high risk group. -She received 4 cycles of adjuvant chemotherapy TC -She has completed adjuvant breast radiation -she started adjuvant letrozole on 10/13/16, will continue for a total of 5-10 years, likely 7 years if she tolerates well   -We'll continue breast cancer surveillance, including annual mammogram, self exam, and routine follow-up -She is clinically doing well, tolerating letrozole well except moderate hot flash, which is manageable.   -Her residual neuropathy has been stable, I  previously suggested she can stop Gabapentin as her neuropathy is mainly numbness. -Her physical exam and 01/2018 mammogram are unremarkable, her lab results reviewed with her, CBC and CMP are with in normal limits. There is no evidence of recurrence.  -I advised her to use corn starch under her breasts to prevent skin fungal infection   -continue on Letrozole  -f/u in 6 months    2.  HTN -continue medication  -continue monitoring BP at home   3. Stage IV Colon Cancer mets to lung and brain, NED --Kayla [name] continues to do well with no evidence for disease recurrence.  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   -She is clinically doing very well. Her CEA level has been normal, today's result is still pending  - Brain MRI in 10/2015 was negative for recurrence. Brain MRI in 04/2016 was negative for recurrence. -her last restaging CT scans in 01/2015 showed no evidence of recurrence - she is clinically doing well, physical exam is unremarkable,  CEA has been normal, we'll continue observation. -she is more than 5 years out of her brain recurrence, follow up with lab CBC, CMP and CEA every 6 months. I do not plan to do another surveillance CT scan  4. Peripheral neuropathy, grade 1, secondary to prior  chemotherapy  -stable and mild  -she is off gabapentin  -She still has numbness, but no tingling. I advised her to restart gabapentin at night for relief.  -I also advised her to take multivitamins with vitamin B complexes. She agrees  5. Genetics -Given her positive family and personal history of breast (Mother at age of 53 and cousin) and colon cancer (cousin), she was referred to genetic counselor -Her genetic test was negative  6. Bone health  -She has not had a bone density scan for several years. -We discussed that letrozole may weaken her bone -I encouraged her to exercise regularly -Since her calcium  is slightly elevated and she is off calcium and Vit D -Bone density scan in 11/02/16 was normal  -Next scan in 2020 along with her mammogram    She will continue  follow-up with her primary care physician for other medical problems  Plan -Continue letrozole  -Lab and f/u in 6 months -mammogram and DEXA in 01/2019, will order on next visit    I spent 20 minutes counseling the patient face to face. The total time spent in the appointment was 25 minutes. She knows to call us if she has any concern or issues before her next appointment.   Dierdre Searles Dweik am acting as scribe for Dr. Truitt Merle.  I have reviewed the above documentation for accuracy and completeness, and I agree with the above.    Truitt Merle  05/30/2018

## 2018-05-30 ENCOUNTER — Encounter: Payer: Self-pay | Admitting: Hematology

## 2018-05-30 ENCOUNTER — Telehealth: Payer: Self-pay

## 2018-05-30 ENCOUNTER — Inpatient Hospital Stay: Payer: Medicare HMO

## 2018-05-30 ENCOUNTER — Inpatient Hospital Stay: Payer: Medicare HMO | Attending: Hematology | Admitting: Hematology

## 2018-05-30 VITALS — BP 148/81 | HR 82 | Temp 98.1°F | Resp 18 | Ht 64.0 in | Wt 187.1 lb

## 2018-05-30 DIAGNOSIS — Z85038 Personal history of other malignant neoplasm of large intestine: Secondary | ICD-10-CM | POA: Diagnosis not present

## 2018-05-30 DIAGNOSIS — I10 Essential (primary) hypertension: Secondary | ICD-10-CM | POA: Diagnosis not present

## 2018-05-30 DIAGNOSIS — C50211 Malignant neoplasm of upper-inner quadrant of right female breast: Secondary | ICD-10-CM | POA: Diagnosis not present

## 2018-05-30 DIAGNOSIS — Z79811 Long term (current) use of aromatase inhibitors: Secondary | ICD-10-CM | POA: Diagnosis not present

## 2018-05-30 DIAGNOSIS — Z170 Estrogen receptor positive status [ER+]: Secondary | ICD-10-CM | POA: Diagnosis not present

## 2018-05-30 DIAGNOSIS — C186 Malignant neoplasm of descending colon: Secondary | ICD-10-CM

## 2018-05-30 LAB — CBC WITH DIFFERENTIAL/PLATELET
Abs Immature Granulocytes: 0.06 10*3/uL (ref 0.00–0.07)
BASOS PCT: 0 %
Basophils Absolute: 0 10*3/uL (ref 0.0–0.1)
EOS ABS: 0.2 10*3/uL (ref 0.0–0.5)
Eosinophils Relative: 2 %
HCT: 43.1 % (ref 36.0–46.0)
Hemoglobin: 13.6 g/dL (ref 12.0–15.0)
Immature Granulocytes: 1 %
Lymphocytes Relative: 22 %
Lymphs Abs: 1.8 10*3/uL (ref 0.7–4.0)
MCH: 27.3 pg (ref 26.0–34.0)
MCHC: 31.6 g/dL (ref 30.0–36.0)
MCV: 86.4 fL (ref 80.0–100.0)
MONO ABS: 0.6 10*3/uL (ref 0.1–1.0)
MONOS PCT: 8 %
NEUTROS PCT: 67 %
Neutro Abs: 5.5 10*3/uL (ref 1.7–7.7)
PLATELETS: 230 10*3/uL (ref 150–400)
RBC: 4.99 MIL/uL (ref 3.87–5.11)
RDW: 14.8 % (ref 11.5–15.5)
WBC: 8.1 10*3/uL (ref 4.0–10.5)
nRBC: 0 % (ref 0.0–0.2)

## 2018-05-30 LAB — COMPREHENSIVE METABOLIC PANEL
ALBUMIN: 3.7 g/dL (ref 3.5–5.0)
ALK PHOS: 134 U/L — AB (ref 38–126)
ALT: 24 U/L (ref 0–44)
ANION GAP: 8 (ref 5–15)
AST: 22 U/L (ref 15–41)
BUN: 16 mg/dL (ref 8–23)
CALCIUM: 9.8 mg/dL (ref 8.9–10.3)
CO2: 28 mmol/L (ref 22–32)
Chloride: 106 mmol/L (ref 98–111)
Creatinine, Ser: 0.8 mg/dL (ref 0.44–1.00)
GFR calc Af Amer: >60 mL/min (ref >60)
GLUCOSE: 104 mg/dL — AB (ref 70–99)
POTASSIUM: 4.1 mmol/L (ref 3.5–5.1)
Sodium: 142 mmol/L (ref 135–145)
Total Bilirubin: 0.8 mg/dL (ref 0.3–1.2)
Total Protein: 7.4 g/dL (ref 6.5–8.1)

## 2018-05-30 LAB — CEA (IN HOUSE-CHCC): CEA (CHCC-IN HOUSE): 1.81 ng/mL (ref 0.00–5.00)

## 2018-05-30 NOTE — Telephone Encounter (Signed)
Printed avs and calender of upcoming appointment. Per 11/13

## 2018-11-19 DIAGNOSIS — R319 Hematuria, unspecified: Secondary | ICD-10-CM | POA: Diagnosis not present

## 2018-11-19 DIAGNOSIS — B373 Candidiasis of vulva and vagina: Secondary | ICD-10-CM | POA: Diagnosis not present

## 2018-11-19 DIAGNOSIS — N390 Urinary tract infection, site not specified: Secondary | ICD-10-CM | POA: Diagnosis not present

## 2018-11-19 DIAGNOSIS — N760 Acute vaginitis: Secondary | ICD-10-CM | POA: Diagnosis not present

## 2018-11-21 ENCOUNTER — Telehealth: Payer: Self-pay | Admitting: Radiation Oncology

## 2018-11-21 NOTE — Telephone Encounter (Signed)
New Message:     LVM for patient to call back with email information to set up webex on 05/18

## 2018-11-26 ENCOUNTER — Telehealth: Payer: Self-pay | Admitting: Hematology

## 2018-11-26 NOTE — Progress Notes (Signed)
[place]   [phone] [phone]   Clinic Follow up Note   Patient Care Team: Vesta Mixer as PCP - General (Physician Assistant) Mayer Masker, [place] (General Surgery) Kyung Rudd, MD as Consulting Physician (Radiation Oncology) Jackolyn Confer, MD as Consulting Physician (General Surgery) Truitt Merle, MD as Consulting Physician (Hematology) Sylvan Cheese, NP as Nurse Practitioner (Hematology and Oncology)   I connected with [name] on 11/28/2018 at 11:15 AM EDT by telephone visit and verified that I am speaking with the correct person using two identifiers.  I discussed the limitations, risks, security and privacy concerns of performing an evaluation and management service by telephone and the availability of in person appointments. I also discussed with the patient that there may be a patient responsible charge related to this service. The patient expressed understanding and agreed to proceed.   Patient's location:  At home  Provider's location:  My Office   CHIEF COMPLAINT: F/u of colon and right breast cancer   SUMMARY OF ONCOLOGIC HISTORY: Oncology History   Breast cancer of upper-inner quadrant of right female breast ([place])   Staging form: Breast, AJCC 7th Edition     Clinical: Stage IIA (T2, N0, M0) - Unsigned     Pathologic stage from 04/14/2015: Stage IIA (T2, N0, cM0) - Signed by Truitt Merle, MD on 05/05/2015 Cancer of left colon [facility])   Staging form: Colon and Rectum, AJCC 7th Edition     Pathologic: T4a, N1a, M1 - Unsigned        Breast cancer of upper-inner quadrant of right female breast ([place])   02/27/2015 Mammogram    Diagnostic mammogram showed a 2.5 cm irregular mass within the far posterior upper inner right breast, ultrasound confirmed a 1.9 x 1.0 x 0.8 cm mass at 1:30 o'clock 15 submitted from nipple. No axillary adenopathy    03/03/2015 Initial Biopsy    Right breast needle biopsy showed  invasive ductal carcinoma, grade 1-2.    03/03/2015 Receptors her2    ER 100%+, PR 80%+, ki67 15%, HER2/neu negative    03/03/2015 Clinical Stage    Stage IIA: T2 N0    03/18/2015 Procedure    VUS at POLD1 gene called c.327G>C. neg at APC, ATM, AXIN2, BARD1, BMPR1A, BRCA1, BRCA2, BRIP1, CDH1, CDK4, CDKN2A, CHEK2, EPCAM, FANCC, MLH1, MSH2, MSH6, MUTYH, NBN, PALB2, PMS2, POLD1, POLE, PTEN, RAD51C, RAD51D, SCG5/GREM1, SMAD4, STK11, TP53, VHL,XRCC2    04/14/2015 Surgery    Right breast lumpectomy and sentinel lymph node biopsy. Surgical margins were negative.    04/14/2015 Pathology Results    right breast invasive ductal carcinoma, grade 2, 2.7 cm,(+) DCIS, margins were negative, 4 sentinel lymph nodes and one axillary lymph nodes were negative, (+)lymphovascular invasion.    04/14/2015 Pathologic Stage    Stage IIA: T2 N0    04/14/2015 Oncotype testing    RS 30, which predicts 10-year risk of distance recurrence with tamoxifen alone 20%, intermediate risk    05/21/2015 - 07/24/2015 Adjuvant Chemotherapy     Adjuvant chemotherapy with docetaxel 75 mg/m, and Cytoxan 600 mg/m X 4 cycles    05/26/2015 - 05/29/2015 Hospital Admission    Pt was admitted for UTI and syncope, treated with antibiotics and IVF     08/17/2015 - 09/13/2015 Radiation Therapy    Adjuvant RT: Right breast treated to 42.5 Gy in 17 fractions, Right breast boost treated to 7.5 Gy in 3 fractions    10/14/2015 -  Anti-estrogen oral  therapy    Letrozole 2.5 mg daily    11/13/2015 Survivorship    Survivorship visit completed    02/02/2016 Mammogram    02/02/2016 Bilateral diagnostic mammogram showed lumpectomy change in the right breast, no evidence of malignancy.    10/19/2016 Imaging    Bone Density 10/19/16 ASSESSMENT: The BMD measured at Femur Neck Left is 0.987 g/cm2 with a T-score of -0.4.     02/03/2017 Mammogram    Diagnostic Mammogram Bilateral 02/03/17 IMPRESSION: No evidence of malignancy within either breast.  Stable postsurgical changes within the right breast. RECOMMENDATION: Bilateral diagnostic mammogram in 1 year.    02/05/2018 Mammogram    02/05/2018 Mammogram IMPRESSION: 1. No mammographic evidence of malignancy in either breast. 2. Stable right breast posttreatment changes.     Colon Oncology History: Diagnosis of colon cancer dates back to 10/27/2008 when [name] presented with what turned out to be a bowel perforation through tumor. Stage at that time was T4 N1 M1 with pulmonary metastatic disease. The K-ras mutation was detected. [name] underwent surgical resection of her tumor with a colostomy at the time of her surgery on 10/27/2008. The colostomy has been subsequently reversed on 09/20/2010. Kapri received chemotherapy consisting of FOLFOX for 10 treatments from 12/10/2008 through 06/02/2009. She achieved a partial remission from these treatments. She then received additional chemotherapy with 5-FU, leucovorin, and 5-FU by continuous infusion along with Avastin from 06/23/2009 through 11/17/2009. Shakirah had some peripheral neuropathy in her feet from the oxaliplatin. She was doing well without evidence of disease until she developed a right hemiparesthesia in September 2012 and was found to have a 3 x 3 x 2.4 cm, lobulated, enhancing mass in the left parietal lobe with marked surrounding edema. A PET scan on 03/25/2011 showed resolution of the previously identified pulmonary nodules with no residual hypermetabolic activity. The pericardial effusion, which has been present since diagnosis, was unchanged. There were also uterine fibroids and a low-density lesion in the anterior spleen that was stable and not associated with any hypermetabolic activity. Of note, Neytiri had a markedly elevated CEA up to 24.7 on 03/23/2011. On 06/23/2011, the CEA was less than 0.5. Dr. Erline Levine resected the recurrent metastatic colon cancer on 03/31/2011. This was followed by stereotactic radiation on  04/15/2011. The patient underwent a left-sided craniotomy and excision of the mass involving her left parietal lobe on 11/10/2011 by Dr. Erline Levine. The pathology report was negative for any malignancy. Pathology report indicated benign brain with fibrosis, hemorrhage, hemosiderin deposition, abundant dystrophic calcifications, and mixed acute and chronic inflammation including foreign body multinucleated giant cells.    Prior Therapy:   1. [name] underwent surgical resection of her tumor with a colostomy at the time of her surgery on 10/27/2008. The colostomy has been subsequently reversed on 09/20/2010. Harmony received chemotherapy consisting of FOLFOX for 10 treatments from 12/10/2008 through 06/02/2009. She achieved a partial remission from these treatments. She then received additional chemotherapy with 5-FU, leucovorin, and 5-FU by continuous infusion along with Avastin from 06/23/2009 through 11/17/2009. 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  2. See above for her breast cancer treatment history   CURRENT THERAPY:  Adjuvant letrozole 2.71m daily started on 10/14/15  INTERVAL HISTORY:  DSHAWNE BULOWis here for a follow up of colon and right breast cancer. She was able to identify herself by birth date. She notes she is doing well. She notes she is taking antibiotics for her UTI which is doing better now.  She is taking Letrozole. She notes joint pain in her knee which is stable. She denies bowel or abdominal issues. She notes she still has neuropathy which is manageable and stable. She notes she does self breast exams and has no concerns. She also has not concerns about her colon cancer.    REVIEW OF SYSTEMS:   Constitutional: Denies fevers, chills or abnormal weight loss Eyes: Denies blurriness of vision Ears, nose, mouth, throat, and face: Denies mucositis or sore throat Respiratory: Denies cough, dyspnea or wheezes Cardiovascular: Denies palpitation,  chest discomfort or lower extremity swelling Gastrointestinal:  Denies nausea, heartburn or change in bowel habits Skin: Denies abnormal skin rashes MSK: (+) joint pain in knee  Lymphatics: Denies new lymphadenopathy or easy bruising Neurological:Denies new weaknesses (+) Stable neuropathy  Behavioral/Psych: Mood is stable, no new changes  All other systems were reviewed with the patient and are negative.  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 echo 1/11. EF 65-70%    Neuromuscular disorder ([facility])    peripheral neuropathy feet   Pericardial effusion    echo 08/13/09   Peripheral vascular disease ([place])    Personal history of chemotherapy    Personal history of radiation therapy    S/P radiation therapy 08/18/15-09/14/15   right breast   Shortness of breath    Thrombocytopenia ([place])     SURGICAL HISTORY: Past Surgical History:  Procedure Laterality Date   BREAST LUMPECTOMY Right 04/14/2015   BREAST LUMPECTOMY WITH RADIOACTIVE SEED AND SENTINEL LYMPH NODE BIOPSY Right 04/14/2015   Procedure: RIGHT BREAST LUMPECTOMY WITH RADIOACTIVE SEED ANDAXILLARY SENTINEL LYMPH NODE BIOPSY;  Surgeon: Jackolyn Confer, MD;  Location: [facility];  Service: General;  Laterality: Right;   COLON SURGERY     COLONOSCOPY  07/22/2010   COLOSTOMY CLOSURE     CRANIOTOMY  03/31/11   left parietal mass resection   CRANIOTOMY  11/10/2011    Procedure: CRANIOTOMY TUMOR EXCISION;  Surgeon: Erline Levine, MD;  Location: [place] [facility];  Service: Neurosurgery;  Laterality: N/A;  Craniotomy for Biopsy of Tumor   PORTACATH PLACEMENT     10   ROTATOR CUFF REPAIR     rt   TONSILLECTOMY     TONSILLECTOMY     TUBAL LIGATION      I have reviewed the social history and family history with the patient and they are unchanged from previous note.  ALLERGIES:  is allergic to sulfonamide derivatives.  MEDICATIONS:  Current Outpatient Medications  Medication Sig Dispense Refill   acetaminophen (TYLENOL) 500 MG tablet Take 1,000 mg by mouth every 6 (six) hours as needed for mild pain, moderate pain or headache. Reported on 09/30/2015     aspirin 81 MG chewable tablet aspirin 81 mg chewable tablet  Chew 1 tablet every day by oral route.     cyclobenzaprine (  FLEXERIL) 10 MG tablet      fluticasone (FLONASE) 50 MCG/ACT nasal spray Place 1 spray into both nostrils daily as needed for allergies. Reported on 11/12/2015     gabapentin (NEURONTIN) 300 MG capsule TAKE ONE CAPSULE BY MOUTH AT BEDTIME (Patient not taking: Reported on 11/27/2017) 90 capsule 0   ibuprofen (ADVIL,MOTRIN) 600 MG tablet      letrozole (FEMARA) 2.5 MG tablet TAKE 1 TABLET (2.5 MG) BY MOUTH ONCE DAILY 90 tablet 1   lisinopril (PRINIVIL,ZESTRIL) 40 MG tablet Take 1 tablet (40 mg total) by mouth daily. 90 tablet 0   pravastatin (PRAVACHOL) 20 MG tablet Take 20 mg by mouth daily.      VITAMIN D, ERGOCALCIFEROL, PO Take 2 drops every other day by mouth. 2000 units     No current facility-administered medications for this visit.     PHYSICAL EXAMINATION: ECOG PERFORMANCE STATUS: 0 - Asymptomatic  No vitals taken today, Exam not performed today  LABORATORY DATA:  I have reviewed the data as listed CBC Latest Ref Rng & Units 05/30/2018 11/27/2017 05/31/2017  WBC 4.0 - 10.5 K/uL 8.1 7.8 8.0  Hemoglobin 12.0 - 15.0 g/dL 13.6 13.9 13.3  Hematocrit 36.0 - 46.0 % 43.1  43.0 41.0  Platelets 150 - 400 K/uL 230 205 205     CMP Latest Ref Rng & Units 05/30/2018 11/27/2017 05/31/2017  Glucose 70 - 99 mg/dL 104(H) 102 95  BUN 8 - 23 mg/dL 16 13 11.5  Creatinine 0.44 - 1.00 mg/dL 0.80 0.88 0.9  Sodium 135 - 145 mmol/L 142 143 143  Potassium 3.5 - 5.1 mmol/L 4.1 4.0 4.2  Chloride 98 - 111 mmol/L 106 108 -  CO2 22 - 32 mmol/L 28 28 28   Calcium 8.9 - 10.3 mg/dL 9.8 10.1 10.3  Total Protein 6.5 - 8.1 g/dL 7.4 7.6 7.3  Total Bilirubin 0.3 - 1.2 mg/dL 0.8 0.7 0.95  Alkaline Phos 38 - 126 U/L 134(H) 135 119  AST 15 - 41 U/L 22 25 23   ALT 0 - 44 U/L 24 32 27      RADIOGRAPHIC STUDIES: I have personally reviewed the radiological images as listed and agreed with the findings in the report. No results found.   ASSESSMENT & PLAN:  Kayla [name] is a 67 y.o. female with    1. Right breat invasive ductal carcinoma, pT2N0M0, stage IIA, ER+/PR+, HER2-, Oncotype RS 30 -She was diagnosed in 02/2015. She is s/p right breast lumpectomy, adjuvant chemo and adjuvant radiation.  -She started antiestrogen therapy with letrozole in 09/2015. Will continue for a total of 5-10 years, likely 7 years, she tolerates well with mild joint pain.  -She is clinically doing well. She still has residual neuropathy, stable.Her 01/2018 mammogram was unremarkable. There is no clinical concern for recurrence.  -Continue surveillance. Next mammogram in 01/2019 -continue on Letrozole  -f/u in 4 months. She will continue follow-up with her primary care physician for other medical problems   2. 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   -She is clinically doing very well. Her CEA level has been normal, today's result is still pending  -Brain MRI in 10/2015 was negative  for recurrence. Brain MRI in 04/2016 was negative for recurrence. -Her  last restaging CT scans in 01/2015 showed no evidence of recurrence -She is clinically doing well and stable, we'll continue observation. -She is more than 5 years out of her brain recurrence, follow up with lab CBC, CMP and CEA every 6 months. I do not plan to do another surveillance CT scan unless she has concerning symptoms.   3. HTN -Continue medication and continue monitoring BP at home   4. Peripheral neuropathy, grade 1, secondary to prior  chemotherapy  -she is off gabapentin  -Stable and mild. She still has numbness, but no tingling. -I also advised her to take multivitamins with vitamin B complexes.  5. Bone health  -Since her calcium is slightly elevated and she is off calcium and Vit D -Bone density scan in 11/02/16 was normal  -Next scan in 01/2019 along with her mammogram   6. Genetics testing was negative   Plan -She is clinically doing well and stable.  -Continue letrozole  -Lab and f/u in 4 months -mammogram and DEXA in 01/2019  No problem-specific Assessment & Plan notes found for this encounter.   Orders Placed This Encounter  Procedures   MM DIAG BREAST TOMO BILATERAL    Standing Status:   Future    Standing Expiration Date:   11/28/2019    Order Specific Question:   Reason for Exam (SYMPTOM  OR DIAGNOSIS REQUIRED)    Answer:   screening    Order Specific Question:   Preferred imaging location?    Answer:   [facility]   DG Bone Density    Standing Status:   Future    Standing Expiration Date:   11/28/2019    Order Specific Question:   Reason for Exam (SYMPTOM  OR DIAGNOSIS REQUIRED)    Answer:   screening    Order Specific Question:   Preferred imaging location?    Answer:   [facility]   I discussed the assessment and treatment plan with the patient. The patient was provided an opportunity to ask questions and all were answered. The patient agreed with the plan and  demonstrated an understanding of the instructions.  The patient was advised to call back or seek an in-person evaluation if the symptoms worsen or if the condition fails to improve as anticipated.  I provided 15 minutes of non face-to-face telephone visit time during this encounter, and > 50% was spent counseling as documented under my assessment & plan.    Truitt Merle, MD 11/28/2018   I, Joslyn Devon, am acting as scribe for Truitt Merle, MD.   I have reviewed the above documentation for accuracy and completeness, and I agree with the above.

## 2018-11-26 NOTE — Telephone Encounter (Signed)
Called regarding upcoming Webex appointment, patient would prefer this to be a telephone visit.

## 2018-11-27 DIAGNOSIS — N390 Urinary tract infection, site not specified: Secondary | ICD-10-CM | POA: Diagnosis not present

## 2018-11-27 DIAGNOSIS — R319 Hematuria, unspecified: Secondary | ICD-10-CM | POA: Diagnosis not present

## 2018-11-27 DIAGNOSIS — B373 Candidiasis of vulva and vagina: Secondary | ICD-10-CM | POA: Diagnosis not present

## 2018-11-27 DIAGNOSIS — N760 Acute vaginitis: Secondary | ICD-10-CM | POA: Diagnosis not present

## 2018-11-28 ENCOUNTER — Encounter: Payer: Self-pay | Admitting: Hematology

## 2018-11-28 ENCOUNTER — Encounter: Payer: Self-pay | Admitting: Internal Medicine

## 2018-11-28 ENCOUNTER — Inpatient Hospital Stay: Payer: Medicare HMO | Attending: Hematology | Admitting: Hematology

## 2018-11-28 ENCOUNTER — Other Ambulatory Visit: Payer: Medicare HMO

## 2018-11-28 ENCOUNTER — Telehealth: Payer: Self-pay | Admitting: Radiation Oncology

## 2018-11-28 ENCOUNTER — Encounter: Payer: Self-pay | Admitting: Radiation Oncology

## 2018-11-28 ENCOUNTER — Ambulatory Visit
Admission: RE | Admit: 2018-11-28 | Discharge: 2018-11-28 | Disposition: A | Discharge status: Home / Self Care | Payer: Medicare HMO | Source: Ambulatory Visit | Attending: Radiation Oncology | Admitting: Radiation Oncology

## 2018-11-28 ENCOUNTER — Other Ambulatory Visit: Payer: Self-pay

## 2018-11-28 DIAGNOSIS — I10 Essential (primary) hypertension: Secondary | ICD-10-CM

## 2018-11-28 DIAGNOSIS — C50211 Malignant neoplasm of upper-inner quadrant of right female breast: Secondary | ICD-10-CM

## 2018-11-28 DIAGNOSIS — Z08 Encounter for follow-up examination after completed treatment for malignant neoplasm: Secondary | ICD-10-CM | POA: Diagnosis not present

## 2018-11-28 DIAGNOSIS — C186 Malignant neoplasm of descending colon: Secondary | ICD-10-CM

## 2018-11-28 DIAGNOSIS — Z79811 Long term (current) use of aromatase inhibitors: Secondary | ICD-10-CM | POA: Diagnosis not present

## 2018-11-28 DIAGNOSIS — E2839 Other primary ovarian failure: Secondary | ICD-10-CM

## 2018-11-28 DIAGNOSIS — C7931 Secondary malignant neoplasm of brain: Secondary | ICD-10-CM

## 2018-11-28 DIAGNOSIS — Z170 Estrogen receptor positive status [ER+]: Secondary | ICD-10-CM

## 2018-11-28 DIAGNOSIS — Z85841 Personal history of malignant neoplasm of brain: Secondary | ICD-10-CM | POA: Diagnosis not present

## 2018-11-28 DIAGNOSIS — Z85038 Personal history of other malignant neoplasm of large intestine: Secondary | ICD-10-CM | POA: Diagnosis not present

## 2018-11-28 NOTE — Telephone Encounter (Deleted)
-----   Message from Hayden Pedro, PA-C sent at 11/21/2018 10:36 AM EDT ----- Regarding: 12/03/18 1 month foollow up

## 2018-11-28 NOTE — Telephone Encounter (Signed)
See visit note

## 2018-11-28 NOTE — Progress Notes (Signed)
Radiation Oncology         [phone] ________________________________  Follow Up - Conducted via telephone due to current COVID-19 concerns for limiting patient exposure  I spoke with the patient to conduct this consult visit via telephone to spare the patient unnecessary potential exposure in the healthcare setting during the current COVID-19 pandemic. The patient was notified in advance and was offered a [place] meeting to allow for face to face communication but unfortunately reported that they did not have the appropriate resources/technology to support such a visit and instead preferred to proceed with a telephone consult.  ________________________________  Name: Kayla [name] MRN: [id number]  Date: 11/28/2018  DOB: July 11, 1951  Follow-Up Visit Note  CC: Frederich Chick, DO  Diagnosis: Breast cancer of upper-inner quadrant of right female breast [facility])   Staging form: Breast, AJCC 7th Edition     Clinical: Stage IIA (T2, N0, M0) - Unsigned     Pathologic stage from 04/14/2015: Stage IIA (T2, N0, cM0) - Signed by Truitt Merle, MD on 05/05/2015 Cancer of left colon [facility])   Staging form: Colon and Rectum, AJCC 7th Edition     Pathologic: T4a, N1a, M1 - Unsigned  Interval Since Last Radiation: 3 years, 3 months  08/17/15 - 08/24/15: Right breast treated to 42.5 Gy in 17 fractions, Right breast boost treated to 7.5 Gy in 3 fractions  04/15/11: 17 Gy to left parietal target in 1 fraction  Narrative:  The patient is contacted by telephone for routine follow-up. She continues on antiestrogen therapy for management of her breast cancer, and has been followed in surveillance for her history of colon cancer. She was just seen by Dr. Burr Medico today. She has done very well since completion of her brain treatment in 2012, and is not able to have insurance coverage for MRIs unless she becomes symptomatic.   On review of systems, the patient reports that she is doing well overall. She  reports occasional sinus drainage and reports that at times if her sinuses are fulll she can feel a bit "Woozy." She denies any headaches, nausea, uncontrolled movements, changes in speech or vision, and denies any chest pain, shortness of breath, cough, fevers, chills, night sweats, unintended weight changes. She denies any bowel or bladder disturbances, and denies abdominal pain,  or vomiting. She did have a recent bladder infection that has resolved.  She denies any new musculoskeletal or joint aches or pains, new skin lesions or concerns. A complete review of systems is obtained and is otherwise negative.    Past Medical History:  Past Medical History:  Diagnosis Date  . Allergy    sulfa  . Arthritis   . Blood transfusion   . 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 Colon cancer ([place])    colon/ 2010/surg/chemo  . Diverticulosis 07/22/2010   sigmoid colon  . Family history of breast cancer   . Family history of colon cancer   . GERD (gastroesophageal reflux disease)   . Headache(784.0)   . Heart murmur   . History of radiation therapy 04/15/11   17 Gy single fraction  l parietal brain metastais  . Hypertension   . LVH (left ventricular hypertrophy)    mod/severe. echo 1/11. EF 65-70%   . Neuromuscular disorder ([place])    peripheral neuropathy feet  . Pericardial effusion    echo 08/13/09  . Peripheral vascular disease ([place])   . Personal history of chemotherapy   .  Personal history of radiation therapy   . S/P radiation therapy 08/18/15-09/14/15   right breast  . Shortness of breath   . Thrombocytopenia ([place])     Past Surgical History: Past Surgical History:  Procedure Laterality Date  . BREAST LUMPECTOMY Right 04/14/2015  . BREAST LUMPECTOMY WITH RADIOACTIVE SEED AND SENTINEL LYMPH NODE BIOPSY Right 04/14/2015   Procedure: RIGHT BREAST LUMPECTOMY WITH RADIOACTIVE SEED ANDAXILLARY SENTINEL LYMPH NODE BIOPSY;  Surgeon: Jackolyn Confer, MD;  Location: [place];  Service: General;  Laterality: Right;  . COLON SURGERY    . COLONOSCOPY  07/22/2010  . COLOSTOMY CLOSURE    . CRANIOTOMY  03/31/11   left parietal mass resection  . CRANIOTOMY  11/10/2011   Procedure: CRANIOTOMY TUMOR EXCISION;  Surgeon: Erline Levine, MD;  Location: [place] [facility]UR[facility]S;  Service: Neurosurgery;  Laterality: N/A;  Craniotomy for Biopsy of Tumor  . PORTACATH PLACEMENT     10  . ROTATOR CUFF REPAIR     rt  . TONSILLECTOMY    . TONSILLECTOMY    . TUBAL LIGATION      Social History:  Social History   Socioeconomic History  . Marital status: Divorced    Spouse name: Not on file  . Number of children: Not on file  . Years of education: Not on file  . Highest education level: Not on file  Occupational History  . Not on file  Social Needs  . Financial resource strain: Not on file  . Food insecurity:    Worry: Not on file    Inability: Not on file  . Transportation needs:    Medical: Not on file    Non-medical: Not on file  Tobacco Use  . Smoking status: Former Smoker    Packs/day: 0.25    Years: 37.00    Pack years: 9.25    Types: Cigarettes    Last attempt to quit: 11/03/2008    Years since quitting: 10.0  . Smokeless tobacco: Never Used  . Tobacco comment: 30 pack year hx   Substance and Sexual Activity  . Alcohol use: No    Comment: rarely  . Drug use: No  . Sexual activity: Yes    Birth control/protection: Post-menopausal  Lifestyle  . Physical activity:    Days per week: Not on file    Minutes per session: Not on file  . Stress: Not on file  Relationships  . Social connections:    Talks on phone: Not on file    Gets together: Not on file    Attends religious service: Not on file    Active member of club or organization: Not on file    Attends meetings of clubs or organizations: Not on file    Relationship status: Not on file  . Intimate partner violence:    Fear of current or ex partner: Not on file    Emotionally abused: Not  on file    Physically abused: Not on file    Forced sexual activity: Not on file  Other Topics Concern  . Not on file  Social History Narrative   Disability   Single, 2 adult sons     Family History: Family History  Problem Relation Age of Onset  . Breast cancer Mother 9  . Diabetes Father   . Coronary artery disease Father   . Gout Brother   . Breast cancer Cousin        dx in her 74s  . Colon cancer Cousin  mother's maternal first cousin  . Diabetes Maternal Aunt   . Cirrhosis Brother     ALLERGIES:  is allergic to sulfonamide derivatives.  Meds: Current Outpatient Medications  Medication Sig Dispense Refill  . acetaminophen (TYLENOL) 500 MG tablet Take 1,000 mg by mouth every 6 (six) hours as needed for mild pain, moderate pain or headache. Reported on 09/30/2015    . aspirin 81 MG chewable tablet aspirin 81 mg chewable tablet  Chew 1 tablet every day by oral route.    . cyclobenzaprine (FLEXERIL) 10 MG tablet     . fluticasone (FLONASE) 50 MCG/ACT nasal spray Place 1 spray into both nostrils daily as needed for allergies. Reported on 11/12/2015    . gabapentin (NEURONTIN) 300 MG capsule TAKE ONE CAPSULE BY MOUTH AT BEDTIME (Patient not taking: Reported on 11/27/2017) 90 capsule 0  . ibuprofen (ADVIL,MOTRIN) 600 MG tablet     . letrozole (FEMARA) 2.5 MG tablet TAKE 1 TABLET (2.5 MG) BY MOUTH ONCE DAILY 90 tablet 1  . lisinopril (PRINIVIL,ZESTRIL) 40 MG tablet Take 1 tablet (40 mg total) by mouth daily. 90 tablet 0  . pravastatin (PRAVACHOL) 20 MG tablet Take 20 mg by mouth daily.     [name] VITAMIN D, ERGOCALCIFEROL, PO Take 2 drops every other day by mouth. 2000 units     No current facility-administered medications for this encounter.     Physical Findings: Unable to assess due to nature of encounter   Lab Findings: Lab Results  Component Value Date   WBC 8.1 05/30/2018   HGB 13.6 05/30/2018   HCT 43.1 05/30/2018   MCV 86.4 05/30/2018   PLT 230 05/30/2018      Radiographic Findings: No results found.  Impression/Plan: 1. 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 The patient is doing great. She is clinically NED and continues under the care of Dr. Burr Medico. We will see her as needed moving forward, but she knows to call if she has any neurologic symptoms.  2. 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 She continues with antiestrogen therapy under the care of Dr. Burr Medico.  3. Possible eustachian tube dysfunction. This appears to have resolved and she continues to use Flonase for seasonal allergies which may have addressed her previous symptoms. This will be followed expectantly.  Given current concerns for patient exposure during the COVID-19 pandemic, this encounter was conducted via telephone.  The patient has given verbal consent for this type of encounter. The time spent during this encounter was 15 minutes and 50% of that time was spent in the coordination of his care. The attendants for this meeting include Shona Simpson, [facility] and [name]  During the encounter, Shona Simpson [facility] was located at [facility] Radiation Oncology Department.  [name]  was located at home.   Carola Rhine, PAC

## 2018-11-29 ENCOUNTER — Telehealth: Payer: Self-pay | Admitting: *Deleted

## 2018-11-29 ENCOUNTER — Telehealth: Payer: Self-pay | Admitting: Hematology

## 2018-11-29 NOTE — Telephone Encounter (Signed)
Scheduled appt per 5/13 los.  A calendar will be mailed out.

## 2018-11-29 NOTE — Telephone Encounter (Signed)
Appt calendar mailed

## 2018-12-03 ENCOUNTER — Ambulatory Visit: Payer: Self-pay | Admitting: Radiation Oncology

## 2018-12-03 ENCOUNTER — Ambulatory Visit: Payer: Medicare HMO | Admitting: Radiation Oncology

## 2018-12-03 ENCOUNTER — Ambulatory Visit: Admission: RE | Admit: 2018-12-03 | Payer: Medicare HMO | Source: Ambulatory Visit | Admitting: Radiation Oncology

## 2018-12-14 ENCOUNTER — Other Ambulatory Visit: Payer: Self-pay | Admitting: Hematology

## 2018-12-14 DIAGNOSIS — C50211 Malignant neoplasm of upper-inner quadrant of right female breast: Secondary | ICD-10-CM

## 2018-12-14 DIAGNOSIS — Z170 Estrogen receptor positive status [ER+]: Secondary | ICD-10-CM

## 2018-12-25 DIAGNOSIS — Z7982 Long term (current) use of aspirin: Secondary | ICD-10-CM | POA: Diagnosis not present

## 2018-12-25 DIAGNOSIS — I10 Essential (primary) hypertension: Secondary | ICD-10-CM | POA: Diagnosis not present

## 2018-12-25 DIAGNOSIS — J309 Allergic rhinitis, unspecified: Secondary | ICD-10-CM | POA: Diagnosis not present

## 2018-12-25 DIAGNOSIS — Z79811 Long term (current) use of aromatase inhibitors: Secondary | ICD-10-CM | POA: Diagnosis not present

## 2018-12-25 DIAGNOSIS — E669 Obesity, unspecified: Secondary | ICD-10-CM | POA: Diagnosis not present

## 2018-12-25 DIAGNOSIS — Z803 Family history of malignant neoplasm of breast: Secondary | ICD-10-CM | POA: Diagnosis not present

## 2018-12-25 DIAGNOSIS — M1990 Unspecified osteoarthritis, unspecified site: Secondary | ICD-10-CM | POA: Diagnosis not present

## 2018-12-25 DIAGNOSIS — E785 Hyperlipidemia, unspecified: Secondary | ICD-10-CM | POA: Diagnosis not present

## 2018-12-25 DIAGNOSIS — G8929 Other chronic pain: Secondary | ICD-10-CM | POA: Diagnosis not present

## 2018-12-25 DIAGNOSIS — C50919 Malignant neoplasm of unspecified site of unspecified female breast: Secondary | ICD-10-CM | POA: Diagnosis not present

## 2018-12-27 ENCOUNTER — Encounter: Payer: Self-pay | Admitting: Internal Medicine

## 2019-01-09 ENCOUNTER — Other Ambulatory Visit: Payer: Self-pay

## 2019-01-09 ENCOUNTER — Ambulatory Visit: Payer: Medicare HMO | Admitting: *Deleted

## 2019-01-09 ENCOUNTER — Encounter: Payer: Self-pay | Admitting: Internal Medicine

## 2019-01-09 VITALS — Ht 64.0 in | Wt 182.0 lb

## 2019-01-09 DIAGNOSIS — Z85038 Personal history of other malignant neoplasm of large intestine: Secondary | ICD-10-CM

## 2019-01-09 NOTE — Progress Notes (Signed)
Patient's pre-visit was done today over the phone with the patient due to COVID-19 pandemic. Name,DOB and address verified. Insurance verified. Packet of Prep instructions mailed to patient including copy of a consent form and pre-procedure patient acknowledgement form-pt is aware. Patient understands to call us back with any questions or concerns.   Patient denies any allergies to eggs or soy. Patient denies any problems with anesthesia/sedation. Patient denies any oxygen use at home. Patient denies taking any diet/weight loss medications or blood thinners. EMMI education assisgned to patient on colonoscopy, this was explained and instructions given to patient.  Pt is aware that care partner will wait in the car during parking lot; if they feel like they will be too hot to wait in the car; they may wait in the lobby.  We want them to wear a mask (we do not have any that we can provide them), practice social distancing, and we will check their temperatures when they get here.  I did remind patient that their care partner needs to stay in the parking lot the entire time. Pt will wear mask into building 

## 2019-01-16 DIAGNOSIS — Z860101 Personal history of adenomatous and serrated colon polyps: Secondary | ICD-10-CM

## 2019-01-16 DIAGNOSIS — Z86010 Personal history of colonic polyps: Secondary | ICD-10-CM

## 2019-01-16 HISTORY — DX: Personal history of colonic polyps: Z86.010

## 2019-01-16 HISTORY — DX: Personal history of adenomatous and serrated colon polyps: Z86.0101

## 2019-01-22 ENCOUNTER — Telehealth: Payer: Self-pay | Admitting: Internal Medicine

## 2019-01-22 NOTE — Telephone Encounter (Signed)

## 2019-01-22 NOTE — Telephone Encounter (Signed)
Patient called and answered no to all questions.

## 2019-01-23 ENCOUNTER — Other Ambulatory Visit: Payer: Self-pay

## 2019-01-23 ENCOUNTER — Encounter: Payer: Self-pay | Admitting: Internal Medicine

## 2019-01-23 ENCOUNTER — Ambulatory Visit (AMBULATORY_SURGERY_CENTER): Payer: Medicare HMO | Admitting: Internal Medicine

## 2019-01-23 VITALS — BP 133/76 | HR 79 | Temp 98.1°F | Resp 15 | Ht 64.0 in | Wt 182.0 lb

## 2019-01-23 DIAGNOSIS — D128 Benign neoplasm of rectum: Secondary | ICD-10-CM

## 2019-01-23 DIAGNOSIS — Z85038 Personal history of other malignant neoplasm of large intestine: Secondary | ICD-10-CM | POA: Diagnosis not present

## 2019-01-23 DIAGNOSIS — D127 Benign neoplasm of rectosigmoid junction: Secondary | ICD-10-CM | POA: Diagnosis not present

## 2019-01-23 DIAGNOSIS — K635 Polyp of colon: Secondary | ICD-10-CM

## 2019-01-23 DIAGNOSIS — D125 Benign neoplasm of sigmoid colon: Secondary | ICD-10-CM | POA: Diagnosis not present

## 2019-01-23 MED ORDER — SODIUM CHLORIDE 0.9 % IV SOLN: 500.0000 mL | Freq: Once | INTRAVENOUS | Status: DC

## 2019-01-23 NOTE — Progress Notes (Signed)
Called to room to assist during endoscopic procedure.  Patient ID and intended procedure confirmed with present staff. Received instructions for my participation in the procedure from the performing physician.  

## 2019-01-23 NOTE — Patient Instructions (Addendum)
I found and removed 3 tiny polyps - no signs of cancer. I will let you know pathology results and when to have another routine colonoscopy by mail and/or My Chart. Suspect you can wait 5 years again.  Good to see you today.  I appreciate the opportunity to care for you. Gatha Mayer, MD, FACG   YOU HAD AN ENDOSCOPIC PROCEDURE TODAY AT [facility]:   Refer to the procedure report that was given to you for any specific questions about what was found during the examination.  If the procedure report does not answer your questions, please call your gastroenterologist to clarify.  If you requested that your care partner not be given the details of your procedure findings, then the procedure report has been included in a sealed envelope for you to review at your convenience later.  YOU SHOULD EXPECT: Some feelings of bloating in the abdomen. Passage of more gas than usual.  Walking can help get rid of the air that was put into your GI tract during the procedure and reduce the bloating. If you had a lower endoscopy (such as a colonoscopy or flexible sigmoidoscopy) you may notice spotting of blood in your stool or on the toilet paper. If you underwent a bowel prep for your procedure, you may not have a normal bowel movement for a few days.  Please Note:  You might notice some irritation and congestion in your nose or some drainage.  This is from the oxygen used during your procedure.  There is no need for concern and it should clear up in a day or so.  SYMPTOMS TO REPORT IMMEDIATELY:   Following lower endoscopy (colonoscopy or flexible sigmoidoscopy):  Excessive amounts of blood in the stool  Significant tenderness or worsening of abdominal pains  Swelling of the abdomen that is new, acute  Fever of 100F or higher   For urgent or emergent issues, a gastroenterologist can be reached at any hour by calling [phone].   DIET:  We do recommend a small meal at first, but then  you may proceed to your regular diet.  Drink plenty of fluids but you should avoid alcoholic beverages for 24 hours.  ACTIVITY:  You should plan to take it easy for the rest of today and you should NOT DRIVE or use heavy machinery until tomorrow (because of the sedation medicines used during the test).    FOLLOW UP: Our staff will call the number listed on your records 48-72 hours following your procedure to check on you and address any questions or concerns that you may have regarding the information given to you following your procedure. If we do not reach you, we will leave a message.  We will attempt to reach you two times.  During this call, we will ask if you have developed any symptoms of COVID 19. If you develop any symptoms (ie: fever, flu-like symptoms, shortness of breath, cough etc.) before then, please call [phone].  If you test positive for Covid 19 in the 2 weeks post procedure, please call and report this information to [place].    If any biopsies were taken you will be contacted by phone or by letter within the next 1-3 weeks.  Please call us at [phone] if you have not heard about the biopsies in 3 weeks.    SIGNATURES/CONFIDENTIALITY: You and/or your care partner have signed paperwork which will be entered into your electronic medical record.  These signatures attest to the  fact that that the information above on your After Visit Summary has been reviewed and is understood.  Full responsibility of the confidentiality of this discharge information lies with you and/or your care-partner.

## 2019-01-23 NOTE — Progress Notes (Signed)
Pt's states no medical or surgical changes since previsit or office visit. 

## 2019-01-23 NOTE — Op Note (Addendum)
[place] Patient Name: Kayla [name] Procedure Date: 01/23/2019 11:44 AM MRN: [id number] Endoscopist: Gatha Mayer , MD Age: 68 Referring MD:  Date of Birth: [birth year] Gender: Female Account #: [id number] Procedure:                Colonoscopy Indications:              High risk colon cancer surveillance: Personal                            history of colon cancer Medicines:                Propofol per Anesthesia, Monitored Anesthesia Care Procedure:                Pre-Anesthesia Assessment:                           - Prior to the procedure, a History and Physical                            was performed, and patient medications and                            allergies were reviewed. The patient's tolerance of                            previous anesthesia was also reviewed. The risks                            and benefits of the procedure and the sedation                            options and risks were discussed with the patient.                            All questions were answered, and informed consent                            was obtained. Prior Anticoagulants: The patient has                            taken no previous anticoagulant or antiplatelet                            agents. ASA Grade Assessment: III - A patient with                            severe systemic disease. After reviewing the risks                            and benefits, the patient was deemed in                            satisfactory condition to undergo the procedure.  After obtaining informed consent, the colonoscope                            was passed under direct vision. Throughout the                            procedure, the patient's blood pressure, pulse, and                            oxygen saturations were monitored continuously. The                            Colonoscope was introduced through the anus and                            advanced to  the the cecum, identified by                            appendiceal orifice and ileocecal valve. The                            colonoscopy was performed without difficulty. The                            patient tolerated the procedure well. The quality                            of the bowel preparation was good. The bowel                            preparation used was Miralax via split dose                            instruction. The ileocecal valve, appendiceal                            orifice, and rectum were photographed. Scope In: 11:46:58 AM Scope Out: 11:56:05 AM Scope Withdrawal Time: 0 hours 8 minutes 15 seconds  Total Procedure Duration: 0 hours 9 minutes 7 seconds  Findings:                 The perianal and digital rectal examinations were                            normal.                           Three sessile polyps were found in the rectum and                            sigmoid colon. The polyps were diminutive in size.                            These polyps were removed with a cold snare.  Resection and retrieval were complete. Verification                            of patient identification for the specimen was                            done. Estimated blood loss was minimal.                           There was evidence of a prior end-to-end                            colo-colonic anastomosis in the sigmoid colon. This                            was patent and was characterized by healthy                            appearing mucosa.                           The exam was otherwise without abnormality on                            direct and retroflexion views. Complications:            No immediate complications. Estimated Blood Loss:     Estimated blood loss was minimal. Impression:               - Three diminutive polyps in the rectum and in the                            sigmoid colon, removed with a cold snare. Resected                             and retrieved.                           - Patent end-to-end colo-colonic anastomosis,                            characterized by healthy appearing mucosa.                           - The examination was otherwise normal on direct                            and retroflexion views. Recommendation:           - Patient has a contact number available for                            emergencies. The signs and symptoms of potential                            delayed complications were discussed with the  patient. Return to normal activities tomorrow.                            Written discharge instructions were provided to the                            patient.                           - Resume previous diet.                           - Continue present medications.                           - Repeat colonoscopy is recommended for                            surveillance. The colonoscopy date will be                            determined after pathology results from today's                            exam become available for review. Gatha Mayer, MD 01/23/2019 12:06:18 PM This report has been signed electronically.

## 2019-01-23 NOTE — Progress Notes (Signed)
PT taken to PACU. Monitors in place. VSS. Report given to RN. 

## 2019-01-25 ENCOUNTER — Telehealth: Payer: Self-pay

## 2019-01-25 NOTE — Telephone Encounter (Signed)
  Follow up Call-  Call back number 01/23/2019  Post procedure Call Back phone  # [phone]  Permission to leave phone message Yes  Some recent data might be hidden     Patient questions:  Do you have a fever, pain , or abdominal swelling? No. Pain Score  0 *  Have you tolerated food without any problems? Yes.    Have you been able to return to your normal activities? Yes.    Do you have any questions about your discharge instructions: Diet   No. Medications  No. Follow up visit  No.  Do you have questions or concerns about your Care? No.  Actions: * If pain score is 4 or above: No action needed, pain <4.  1. Have you developed a fever since your procedure? no  2.   Have you had an respiratory symptoms (SOB or cough) since your procedure? no  3.   Have you tested positive for COVID 19 since your procedure no  4.   Have you had any family members/close contacts diagnosed with the COVID 19 since your procedure?  no   If yes to any of these questions please route to Joylene John, RN and Alphonsa Gin, Therapist, sports.

## 2019-01-29 ENCOUNTER — Encounter: Payer: Self-pay | Admitting: Internal Medicine

## 2019-01-29 DIAGNOSIS — Z86010 Personal history of colonic polyps: Secondary | ICD-10-CM | POA: Insufficient documentation

## 2019-01-29 DIAGNOSIS — Z860101 Personal history of adenomatous and serrated colon polyps: Secondary | ICD-10-CM | POA: Insufficient documentation

## 2019-02-21 ENCOUNTER — Other Ambulatory Visit: Payer: Self-pay | Admitting: Hematology

## 2019-02-21 DIAGNOSIS — Z853 Personal history of malignant neoplasm of breast: Secondary | ICD-10-CM

## 2019-02-21 DIAGNOSIS — C186 Malignant neoplasm of descending colon: Secondary | ICD-10-CM

## 2019-02-25 ENCOUNTER — Other Ambulatory Visit: Payer: Self-pay

## 2019-02-25 ENCOUNTER — Ambulatory Visit
Admission: RE | Admit: 2019-02-25 | Discharge: 2019-02-25 | Disposition: A | Discharge status: Home / Self Care | Payer: Medicare HMO | Source: Ambulatory Visit | Attending: Hematology | Admitting: Hematology

## 2019-02-25 DIAGNOSIS — E2839 Other primary ovarian failure: Secondary | ICD-10-CM

## 2019-02-25 DIAGNOSIS — Z780 Asymptomatic menopausal state: Secondary | ICD-10-CM | POA: Diagnosis not present

## 2019-02-25 DIAGNOSIS — Z13820 Encounter for screening for osteoporosis: Secondary | ICD-10-CM | POA: Diagnosis not present

## 2019-02-25 DIAGNOSIS — R928 Other abnormal and inconclusive findings on diagnostic imaging of breast: Secondary | ICD-10-CM | POA: Diagnosis not present

## 2019-02-25 DIAGNOSIS — Z853 Personal history of malignant neoplasm of breast: Secondary | ICD-10-CM

## 2019-02-25 DIAGNOSIS — C186 Malignant neoplasm of descending colon: Secondary | ICD-10-CM

## 2019-02-26 ENCOUNTER — Telehealth: Payer: Self-pay

## 2019-02-26 NOTE — Telephone Encounter (Signed)
Pt. Informed of normal Dexa scan, Pt. Verbalized understanding.

## 2019-02-26 NOTE — Telephone Encounter (Signed)
-----   Message from Alla Feeling, NP sent at 02/26/2019  3:37 PM EDT ----- Please let her know bone density scan is normal ----- Message ----- From: Truitt Merle, MD Sent: 02/26/2019  10:27 AM EDT To: [facility]  Please let pt know her DEXA was normal, thanks   Truitt Merle  02/26/2019

## 2019-03-28 NOTE — Progress Notes (Signed)
[place]   [phone] [phone]   Clinic Follow up Note   Patient Care Team: Zhou-Talbert, Elwyn Lade, MD as PCP - General (Family Medicine) Mayer Masker, Anderson (General Surgery) Kyung Rudd, MD as Consulting Physician (Radiation Oncology) Jackolyn Confer, MD as Consulting Physician (General Surgery) Truitt Merle, MD as Consulting Physician (Hematology) Sylvan Cheese, NP as Nurse Practitioner (Hematology and Oncology)  Date of Service:  04/01/2019  CHIEF COMPLAINT: F/u of colon and right breast cancer   SUMMARY OF ONCOLOGIC HISTORY: Oncology History Overview Note  Breast cancer of upper-inner quadrant of right female breast [facility])   Staging form: Breast, AJCC 7th Edition     Clinical: Stage IIA (T2, N0, M0) - Unsigned     Pathologic stage from 04/14/2015: Stage IIA (T2, N0, cM0) - Signed by Truitt Merle, MD on 05/05/2015 Cancer of left colon [facility])   Staging form: Colon and Rectum, AJCC 7th Edition     Pathologic: T4a, N1a, M1 - Unsigned      Breast cancer of upper-inner quadrant of right female breast ([place])  02/27/2015 Mammogram   Diagnostic mammogram showed a 2.5 cm irregular mass within the far posterior upper inner right breast, ultrasound confirmed a 1.9 x 1.0 x 0.8 cm mass at 1:30 o'clock 15 submitted from nipple. No axillary adenopathy   03/03/2015 Initial Biopsy   Right breast needle biopsy showed invasive ductal carcinoma, grade 1-2.   03/03/2015 Receptors her2   ER 100%+, PR 80%+, ki67 15%, HER2/neu negative   03/03/2015 Clinical Stage   Stage IIA: T2 N0   03/18/2015 Procedure   VUS at POLD1 gene called c.327G>C. neg at APC, ATM, AXIN2, BARD1, BMPR1A, BRCA1, BRCA2, BRIP1, CDH1, CDK4, CDKN2A, CHEK2, EPCAM, FANCC, MLH1, MSH2, MSH6, MUTYH, NBN, PALB2, PMS2, POLD1, POLE, PTEN, RAD51C, RAD51D, SCG5/GREM1, SMAD4, STK11, TP53, VHL,XRCC2   04/14/2015 Surgery   Right breast lumpectomy and sentinel lymph node biopsy. Surgical  margins were negative.   04/14/2015 Pathology Results   right breast invasive ductal carcinoma, grade 2, 2.7 cm,(+) DCIS, margins were negative, 4 sentinel lymph nodes and one axillary lymph nodes were negative, (+)lymphovascular invasion.   04/14/2015 Pathologic Stage   Stage IIA: T2 N0   04/14/2015 Oncotype testing   RS 30, which predicts 10-year risk of distance recurrence with tamoxifen alone 20%, intermediate risk   05/21/2015 - 07/24/2015 Adjuvant Chemotherapy    Adjuvant chemotherapy with docetaxel 75 mg/m, and Cytoxan 600 mg/m X 4 cycles   05/26/2015 - 05/29/2015 Hospital Admission   Pt was admitted for UTI and syncope, treated with antibiotics and IVF    08/17/2015 - 09/13/2015 Radiation Therapy   Adjuvant RT: Right breast treated to 42.5 Gy in 17 fractions, Right breast boost treated to 7.5 Gy in 3 fractions   10/14/2015 -  Anti-estrogen oral therapy   Letrozole 2.5 mg daily   11/13/2015 Survivorship   Survivorship visit completed   02/02/2016 Mammogram   02/02/2016 Bilateral diagnostic mammogram showed lumpectomy change in the right breast, no evidence of malignancy.   10/19/2016 Imaging   Bone Density 10/19/16 ASSESSMENT: The BMD measured at Femur Neck Left is 0.987 g/cm2 with a T-score of -0.4.    02/03/2017 Mammogram   Diagnostic Mammogram Bilateral 02/03/17 IMPRESSION: No evidence of malignancy within either breast. Stable postsurgical changes within the right breast. RECOMMENDATION: Bilateral diagnostic mammogram in 1 year.   02/05/2018 Mammogram   02/05/2018 Mammogram IMPRESSION: 1. No mammographic evidence of malignancy in either breast. 2. Stable right  breast posttreatment changes.     Colon Oncology History: Diagnosis of colon cancer dates back to 10/27/2008 when Kayla [name] presented with what turned out to be a bowel perforation through tumor. Stage at that time was T4 N1 M1 with pulmonary metastatic disease. The K-ras mutation was detected. Kayla [name] underwent  surgical resection of her tumor with a colostomy at the time of her surgery on 10/27/2008. The colostomy has been subsequently reversed on 09/20/2010. Kayla [name] received chemotherapy consisting of FOLFOX for 10 treatments from 12/10/2008 through 06/02/2009. She achieved a partial remission from these treatments. She then received additional chemotherapy with 5-FU, leucovorin, and 5-FU by continuous infusion along with Avastin from 06/23/2009 through 11/17/2009. 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 She was doing well without evidence of disease until she developed a right hemiparesthesia in September 2012 and was found to have a 3 x 3 x 2.4 cm, lobulated, enhancing mass in the left parietal lobe with marked surrounding edema. A PET scan on 03/25/2011 showed resolution of the previously identified pulmonary nodules with no residual hypermetabolic activity. The pericardial effusion, which has been present since diagnosis, was unchanged. There were also uterine fibroids and a low-density lesion in the anterior spleen that was stable and not associated with any hypermetabolic activity. Of note, Kayla [name] had a markedly elevated CEA up to 24.7 on 03/23/2011. On 06/23/2011, the CEA was less than 0.5. Dr. Erline Levine resected the recurrent metastatic colon cancer on 03/31/2011. This was followed by stereotactic radiation on 04/15/2011. The patient underwent a left-sided craniotomy and excision of the mass involving her left parietal lobe on 11/10/2011 by Dr. Erline Levine. The pathology report was negative for any malignancy. Pathology report indicated benign brain with fibrosis, hemorrhage, hemosiderin deposition, abundant dystrophic calcifications, and mixed acute and chronic inflammation including foreign body multinucleated giant cells.   Prior Therapy:  1. Kayla [name] underwent surgical resection of her tumor with a colostomy at the time of her surgery on 10/27/2008. The colostomy has  been subsequently reversed on 09/20/2010. Kayla [name] received chemotherapy consisting of FOLFOX for 10 treatments from 12/10/2008 through 06/02/2009. She achieved a partial remission from these treatments. She then received additional chemotherapy with 5-FU, leucovorin, and 5-FU by continuous infusion along with Avastin from 06/23/2009 through 11/17/2009. 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  2. See above for her breast cancer treatment history   CURRENT THERAPY:  Adjuvant letrozole 2.'5mg'$  daily started on 10/14/15  INTERVAL HISTORY:  Kayla [name] is here for a follow up colon and breast cancer. She presents to the clinic alone. She notes she is doing well. She notes she does not exercise much but tries to stay busy. She notes having hot flashes. She notes she sleeps less and some times will not fall asleep until 3AM most of the time. She also gets up later. She has occasional nubness of her right foot. This mostly happens after walking for some time. She notes occasional muscle spasm at right breast. It does not last long. She feels she drinks enough water. She notes she has not been taking Gabapentin. I reviewed her medication list.    REVIEW OF SYSTEMS:   Constitutional: Denies fevers, chills or abnormal weight loss (+) Intermittent insomnia (+) Hot flashes  Eyes: Denies blurriness of vision Ears, nose, mouth, throat, and face: Denies mucositis or sore throat Respiratory: Denies cough, dyspnea or wheezes Cardiovascular: Denies palpitation, chest discomfort (+) Right foot lower extremity swelling Gastrointestinal:  Denies nausea,  heartburn or change in bowel habits Skin: Denies abnormal skin rashes MSK: (+) occasional cramping at right breast from surgery  Lymphatics: Denies new lymphadenopathy or easy bruising Neurological:Denies numbness, tingling or new weaknesses Behavioral/Psych: Mood is stable, no new changes  All other systems were reviewed with the  patient and are negative.  MEDICAL HISTORY:  Past Medical History:  Diagnosis Date  . Allergy    sulfa  . Arthritis   . Blood transfusion   . 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 Colon cancer ([place])    colon/ 2010/surg/chemo  . Diverticulosis 07/22/2010   sigmoid colon  . Family history of breast cancer   . Family history of colon cancer   . GERD (gastroesophageal reflux disease)   . Headache(784.0)   . Heart murmur   . History of radiation therapy 04/15/11   17 Gy single fraction  l parietal brain metastais  . Hx of adenomatous colonic polyps 01/2019  . Hypertension   . LVH (left ventricular hypertrophy)    mod/severe. echo 1/11. EF 65-70%   . Neuromuscular disorder ([place])    peripheral neuropathy feet  . Pericardial effusion    echo 08/13/09  . Peripheral vascular disease ([place])   . Personal history of chemotherapy   . Personal history of radiation therapy   . S/P radiation therapy 08/18/15-09/14/15   right breast  . Shortness of breath   . Thrombocytopenia ([place])     SURGICAL HISTORY: Past Surgical History:  Procedure Laterality Date  . BREAST LUMPECTOMY Right 04/14/2015  . BREAST LUMPECTOMY WITH RADIOACTIVE SEED AND SENTINEL LYMPH NODE BIOPSY Right 04/14/2015   Procedure: RIGHT BREAST LUMPECTOMY WITH RADIOACTIVE SEED ANDAXILLARY SENTINEL LYMPH NODE BIOPSY;  Surgeon: Jackolyn Confer, MD;  Location: [facility];  Service: General;  Laterality: Right;  . COLON SURGERY  2010  . COLONOSCOPY  07/22/2010,11-20-13  . COLOSTOMY CLOSURE    . CRANIOTOMY  03/31/11   left parietal mass resection  . CRANIOTOMY  11/10/2011   Procedure: CRANIOTOMY TUMOR EXCISION;  Surgeon: Erline Levine, MD;  Location: [place] [facility];  Service: Neurosurgery;  Laterality: N/A;  Craniotomy for Biopsy of Tumor  . PORTACATH PLACEMENT     10  . ROTATOR CUFF REPAIR     rt  . TONSILLECTOMY    . TONSILLECTOMY    . TUBAL LIGATION      I have reviewed the social history and  family history with the patient and they are unchanged from previous note.  ALLERGIES:  is allergic to sulfonamide derivatives and sulfur.  MEDICATIONS:  Current Outpatient Medications  Medication Sig Dispense Refill  . acetaminophen (TYLENOL) 500 MG tablet Take 1,000 mg by mouth every 6 (six) hours as needed for mild pain, moderate pain or headache. Reported on 09/30/2015    . aspirin 81 MG chewable tablet aspirin 81 mg chewable tablet  Chew 1 tablet every day by oral route.    . fluticasone (FLONASE) 50 MCG/ACT nasal spray Place 1 spray into both nostrils daily as needed for allergies. Reported on 11/12/2015    . letrozole (FEMARA) 2.5 MG tablet TAKE 1 TABLET (2.5 MG) BY MOUTH ONCE DAILY 90 tablet 1  . lisinopril (PRINIVIL,ZESTRIL) 40 MG tablet Take 1 tablet (40 mg total) by mouth daily. 90 tablet 0  . pravastatin (PRAVACHOL) 20 MG tablet Take 20 mg by mouth daily.     [name] VITAMIN D, ERGOCALCIFEROL, PO Take 2 drops every other day by mouth. 2000  units     No current facility-administered medications for this visit.     PHYSICAL EXAMINATION: ECOG PERFORMANCE STATUS: 0 - Asymptomatic  Vitals:   04/01/19 1344  BP: 133/72  Pulse: 72  Resp: 16  Temp: 98.3 F (36.8 C)  SpO2: 100%   Filed Weights   04/01/19 1344  Weight: 183 lb 11.2 oz (83.3 kg)    GENERAL:alert, no distress and comfortable SKIN: skin color, texture, turgor are normal, no rashes or significant lesions EYES: normal, Conjunctiva are pink and non-injected, sclera clear  NECK: supple, thyroid normal size, non-tender, without nodularity LYMPH:  no palpable lymphadenopathy in the cervical, axillary  LUNGS: clear to auscultation and percussion with normal breathing effort HEART: regular rate & rhythm and no murmurs and no lower e2xtremity edema ABDOMEN:abdomen soft, non-tender and normal bowel sounds (+) Mild scar tissue at colon surgery incisions Musculoskeletal:no cyanosis of digits and no clubbing  NEURO: alert &  oriented x 3 with fluent speech, no focal motor/sensory deficits BREAST: S/p right lumpectomy: Surgical incision healed well. No palpable mass, nodules or adenopathy bilaterally. Breast exam benign.   LABORATORY DATA:  I have reviewed the data as listed CBC Latest Ref Rng & Units 04/01/2019 05/30/2018 11/27/2017  WBC 4.0 - 10.5 K/uL 9.0 8.1 7.8  Hemoglobin 12.0 - 15.0 g/dL 14.2 13.6 13.9  Hematocrit 36.0 - 46.0 % 44.7 43.1 43.0  Platelets 150 - 400 K/uL 222 230 205     CMP Latest Ref Rng & Units 04/01/2019 05/30/2018 11/27/2017  Glucose 70 - 99 mg/dL 116(H) 104(H) 102  BUN 8 - 23 mg/dL _0 Creatinine 0.44 - 1.00 mg/dL 1.08(H) 0.80 0.88  Sodium 135 - 145 mmol/L 142 142 143  Potassium 3.5 - 5.1 mmol/L 4.1 4.1 4.0  Chloride 98 - 111 mmol/L 106 106 108  CO2 22 - 32 mmol/L _1 Calcium 8.9 - 10.3 mg/dL 10.1 9.8 10.1  Total Protein 6.5 - 8.1 g/dL 7.4 7.4 7.6  Total Bilirubin 0.3 - 1.2 mg/dL 0.8 0.8 0.7  Alkaline Phos 38 - 126 U/L 112 134(H) 135  AST 15 - 41 U/L _2 ALT 0 - 44 U/L 23 24 32      RADIOGRAPHIC STUDIES: I have personally reviewed the radiological images as listed and agreed with the findings in the report. No results found.   ASSESSMENT & PLAN:  Kayla [name] is a 68 y.o. female with   1. Right breat invasive ductal carcinoma, pT2N0M0, stage IIA, ER+/PR+, HER2-, Oncotype RS 30 -She was diagnosed in 02/2015. She is s/p right breast lumpectomy, adjuvant chemo and adjuvant radiation.  -She started antiestrogen therapy with letrozole in 09/2015. Will continue for a total of 5-10 years, likely 7 years, she tolerates well with manageble hot flashes  -Genetics testing was negative -She is clinically doing well. Lab reviewed, her CBC and CMP are within normal limits BG 116, Cr 1.08. Her physical exam and her 02/2019 mammogram were unremarkable. There is no clinical concern for recurrence. -Continue surveillance. Next mammogram in 02/2020.  -continue Letrozole. If  her hot flashes or insomnia are not manageble she can take her Gabapentin if needed.  -F/u 6 months  -I offered her the flu shot. She declined today.   2. 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 She is now out 6 years from the time of diagnosis and  almost 4 years from the time of diagnosis of her right brain recurrence.  -She is clinically doing very well. Her CEA level has been normal, today's result is still pending  -Brain MRI in 10/2015 was negative for recurrence. Brain MRI in 04/2016 was negative for recurrence. -Her last restaging CT scans in 01/2015 showed no evidence of recurrence -She is clinically doing well and stable, we'll continue observation.  -She ismore than 5.5years out of her brain recurrence, follow up with lab CBC, CMP and CEA every 6-12 months. I do not plan to do another surveillance CT scan unless she has concerning symptoms.   3. HTN -Continue medication and continue monitoring BP at home   4. Peripheral neuropathy, grade 1, secondary to prior chemotherapy  -she is off gabapentin -Stable and mild. She still has numbness, but no tingling. -I also advised her to take multivitamins with vitamin B complexes.  5. Bone health  -Since her calcium is slightly elevated and she is off calcium and Vit D -Bone density scan in 11/02/16 was normal (T-score -0.4) -DEXA scan from 02/2019 normal (T-score -0.6)    Plan -She is clinically doing well and stable.  -Continue letrozole  -Lab and f/u in 6 months    No problem-specific Assessment & Plan notes found for this encounter.   No orders of the defined types were placed in this encounter.  All questions were answered. The patient knows to call the clinic with any problems, questions or concerns. No barriers to learning was detected. I spent 15 minutes counseling the patient face to face. The total time spent  in the appointment was 20 minutes and more than 50% was on counseling and review of test results     Truitt Merle, MD 04/01/2019   I, Joslyn Devon, am acting as scribe for Truitt Merle, MD.   I have reviewed the above documentation for accuracy and completeness, and I agree with the above.

## 2019-04-01 ENCOUNTER — Other Ambulatory Visit: Payer: Self-pay

## 2019-04-01 ENCOUNTER — Inpatient Hospital Stay: Payer: Medicare HMO | Attending: Hematology

## 2019-04-01 ENCOUNTER — Telehealth: Payer: Self-pay | Admitting: Hematology

## 2019-04-01 ENCOUNTER — Inpatient Hospital Stay (HOSPITAL_BASED_OUTPATIENT_CLINIC_OR_DEPARTMENT_OTHER): Payer: Medicare HMO | Admitting: Hematology

## 2019-04-01 ENCOUNTER — Encounter: Payer: Self-pay | Admitting: Hematology

## 2019-04-01 VITALS — BP 133/72 | HR 72 | Temp 98.3°F | Resp 16 | Ht 64.0 in | Wt 183.7 lb

## 2019-04-01 DIAGNOSIS — C50211 Malignant neoplasm of upper-inner quadrant of right female breast: Secondary | ICD-10-CM | POA: Insufficient documentation

## 2019-04-01 DIAGNOSIS — Z923 Personal history of irradiation: Secondary | ICD-10-CM | POA: Insufficient documentation

## 2019-04-01 DIAGNOSIS — Z9221 Personal history of antineoplastic chemotherapy: Secondary | ICD-10-CM | POA: Diagnosis not present

## 2019-04-01 DIAGNOSIS — Z79811 Long term (current) use of aromatase inhibitors: Secondary | ICD-10-CM | POA: Diagnosis not present

## 2019-04-01 DIAGNOSIS — Z85038 Personal history of other malignant neoplasm of large intestine: Secondary | ICD-10-CM | POA: Insufficient documentation

## 2019-04-01 DIAGNOSIS — C186 Malignant neoplasm of descending colon: Secondary | ICD-10-CM

## 2019-04-01 DIAGNOSIS — R011 Cardiac murmur, unspecified: Secondary | ICD-10-CM | POA: Insufficient documentation

## 2019-04-01 DIAGNOSIS — M1990 Unspecified osteoarthritis, unspecified site: Secondary | ICD-10-CM | POA: Diagnosis not present

## 2019-04-01 DIAGNOSIS — Z7982 Long term (current) use of aspirin: Secondary | ICD-10-CM | POA: Diagnosis not present

## 2019-04-01 DIAGNOSIS — K219 Gastro-esophageal reflux disease without esophagitis: Secondary | ICD-10-CM | POA: Diagnosis not present

## 2019-04-01 DIAGNOSIS — I10 Essential (primary) hypertension: Secondary | ICD-10-CM | POA: Diagnosis not present

## 2019-04-01 DIAGNOSIS — Z170 Estrogen receptor positive status [ER+]: Secondary | ICD-10-CM

## 2019-04-01 DIAGNOSIS — Z79899 Other long term (current) drug therapy: Secondary | ICD-10-CM | POA: Insufficient documentation

## 2019-04-01 LAB — COMPREHENSIVE METABOLIC PANEL
ALT: 23 U/L (ref 0–44)
AST: 21 U/L (ref 15–41)
Albumin: 3.9 g/dL (ref 3.5–5.0)
Alkaline Phosphatase: 112 U/L (ref 38–126)
Anion gap: 7 (ref 5–15)
BUN: 16 mg/dL (ref 8–23)
CO2: 29 mmol/L (ref 22–32)
Calcium: 10.1 mg/dL (ref 8.9–10.3)
Chloride: 106 mmol/L (ref 98–111)
Creatinine, Ser: 1.08 mg/dL — ABNORMAL HIGH (ref 0.44–1.00)
GFR calc Af Amer: >60 mL/min (ref >60)
GFR calc non Af Amer: 53 mL/min — ABNORMAL LOW (ref >60)
Glucose, Bld: 116 mg/dL — ABNORMAL HIGH (ref 70–99)
Potassium: 4.1 mmol/L (ref 3.5–5.1)
Sodium: 142 mmol/L (ref 135–145)
Total Bilirubin: 0.8 mg/dL (ref 0.3–1.2)
Total Protein: 7.4 g/dL (ref 6.5–8.1)

## 2019-04-01 LAB — CBC WITH DIFFERENTIAL/PLATELET
Abs Immature Granulocytes: 0.05 10*3/uL (ref 0.00–0.07)
Basophils Absolute: 0 10*3/uL (ref 0.0–0.1)
Basophils Relative: 0 %
Eosinophils Absolute: 0.2 10*3/uL (ref 0.0–0.5)
Eosinophils Relative: 2 %
HCT: 44.7 % (ref 36.0–46.0)
Hemoglobin: 14.2 g/dL (ref 12.0–15.0)
Immature Granulocytes: 1 %
Lymphocytes Relative: 18 %
Lymphs Abs: 1.6 10*3/uL (ref 0.7–4.0)
MCH: 27.2 pg (ref 26.0–34.0)
MCHC: 31.8 g/dL (ref 30.0–36.0)
MCV: 85.6 fL (ref 80.0–100.0)
Monocytes Absolute: 0.6 10*3/uL (ref 0.1–1.0)
Monocytes Relative: 7 %
Neutro Abs: 6.5 10*3/uL (ref 1.7–7.7)
Neutrophils Relative %: 72 %
Platelets: 222 10*3/uL (ref 150–400)
RBC: 5.22 MIL/uL — ABNORMAL HIGH (ref 3.87–5.11)
RDW: 15.1 % (ref 11.5–15.5)
WBC: 9 10*3/uL (ref 4.0–10.5)
nRBC: 0 % (ref 0.0–0.2)

## 2019-04-01 NOTE — Telephone Encounter (Signed)
Scheduled per 09/14 los, patient received after visit summary and calender.

## 2019-06-14 ENCOUNTER — Other Ambulatory Visit: Payer: Self-pay | Admitting: Hematology

## 2019-06-14 DIAGNOSIS — C50211 Malignant neoplasm of upper-inner quadrant of right female breast: Secondary | ICD-10-CM

## 2019-06-14 DIAGNOSIS — Z170 Estrogen receptor positive status [ER+]: Secondary | ICD-10-CM

## 2019-09-12 DIAGNOSIS — K219 Gastro-esophageal reflux disease without esophagitis: Secondary | ICD-10-CM | POA: Diagnosis not present

## 2019-09-12 DIAGNOSIS — I110 Hypertensive heart disease with heart failure: Secondary | ICD-10-CM | POA: Diagnosis not present

## 2019-09-12 DIAGNOSIS — E785 Hyperlipidemia, unspecified: Secondary | ICD-10-CM | POA: Diagnosis not present

## 2019-09-12 DIAGNOSIS — J449 Chronic obstructive pulmonary disease, unspecified: Secondary | ICD-10-CM | POA: Diagnosis not present

## 2019-09-12 DIAGNOSIS — G8929 Other chronic pain: Secondary | ICD-10-CM | POA: Diagnosis not present

## 2019-09-12 DIAGNOSIS — I739 Peripheral vascular disease, unspecified: Secondary | ICD-10-CM | POA: Diagnosis not present

## 2019-09-12 DIAGNOSIS — J309 Allergic rhinitis, unspecified: Secondary | ICD-10-CM | POA: Diagnosis not present

## 2019-09-12 DIAGNOSIS — I509 Heart failure, unspecified: Secondary | ICD-10-CM | POA: Diagnosis not present

## 2019-09-12 DIAGNOSIS — C50919 Malignant neoplasm of unspecified site of unspecified female breast: Secondary | ICD-10-CM | POA: Diagnosis not present

## 2019-09-12 DIAGNOSIS — E669 Obesity, unspecified: Secondary | ICD-10-CM | POA: Diagnosis not present

## 2019-09-12 DIAGNOSIS — Z008 Encounter for other general examination: Secondary | ICD-10-CM | POA: Diagnosis not present

## 2019-09-14 ENCOUNTER — Other Ambulatory Visit: Payer: Self-pay | Admitting: Hematology

## 2019-09-14 DIAGNOSIS — C50211 Malignant neoplasm of upper-inner quadrant of right female breast: Secondary | ICD-10-CM

## 2019-09-14 DIAGNOSIS — Z170 Estrogen receptor positive status [ER+]: Secondary | ICD-10-CM

## 2019-09-23 NOTE — Progress Notes (Signed)
[place]   [phone] [phone]   Clinic Follow up Note   Patient Care Team: Zhou-Talbert, Elwyn Lade, MD as PCP - General (Family Medicine) Mayer Masker, [place] (General Surgery) Kyung Rudd, MD as Consulting Physician (Radiation Oncology) Jackolyn Confer, MD as Consulting Physician (General Surgery) Truitt Merle, MD as Consulting Physician (Hematology) Sylvan Cheese, NP as Nurse Practitioner (Hematology and Oncology)  Date of Service:  09/30/2019  CHIEF COMPLAINT: F/u of colon and right breast cancer  SUMMARY OF ONCOLOGIC HISTORY: Oncology History Overview Note  Breast cancer of upper-inner quadrant of right female breast [name])   Staging form: Breast, AJCC 7th Edition     Clinical: Stage IIA (T2, N0, M0) - Unsigned     Pathologic stage from 04/14/2015: Stage IIA (T2, N0, cM0) - Signed by Truitt Merle, MD on 05/05/2015 Cancer of left colon [facility])   Staging form: Colon and Rectum, AJCC 7th Edition     Pathologic: T4a, N1a, M1 - Unsigned      Breast cancer of upper-inner quadrant of right female breast ([name])  02/27/2015 Mammogram   Diagnostic mammogram showed a 2.5 cm irregular mass within the far posterior upper inner right breast, ultrasound confirmed a 1.9 x 1.0 x 0.8 cm mass at 1:30 o'clock 15 submitted from nipple. No axillary adenopathy   03/03/2015 Initial Biopsy   Right breast needle biopsy showed invasive ductal carcinoma, grade 1-2.   03/03/2015 Receptors her2   ER 100%+, PR 80%+, ki67 15%, HER2/neu negative   03/03/2015 Clinical Stage   Stage IIA: T2 N0   03/18/2015 Procedure   VUS at POLD1 gene called c.327G>C. neg at APC, ATM, AXIN2, BARD1, BMPR1A, BRCA1, BRCA2, BRIP1, CDH1, CDK4, CDKN2A, CHEK2, EPCAM, FANCC, MLH1, MSH2, MSH6, MUTYH, NBN, PALB2, PMS2, POLD1, POLE, PTEN, RAD51C, RAD51D, SCG5/GREM1, SMAD4, STK11, TP53, VHL,XRCC2   04/14/2015 Surgery   Right breast lumpectomy and sentinel lymph node biopsy. Surgical  margins were negative.   04/14/2015 Pathology Results   right breast invasive ductal carcinoma, grade 2, 2.7 cm,(+) DCIS, margins were negative, 4 sentinel lymph nodes and one axillary lymph nodes were negative, (+)lymphovascular invasion.   04/14/2015 Pathologic Stage   Stage IIA: T2 N0   04/14/2015 Oncotype testing   RS 30, which predicts 10-year risk of distance recurrence with tamoxifen alone 20%, intermediate risk   05/21/2015 - 07/24/2015 Adjuvant Chemotherapy    Adjuvant chemotherapy with docetaxel 75 mg/m, and Cytoxan 600 mg/m X 4 cycles   05/26/2015 - 05/29/2015 Hospital Admission   Pt was admitted for UTI and syncope, treated with antibiotics and IVF    08/17/2015 - 09/13/2015 Radiation Therapy   Adjuvant RT: Right breast treated to 42.5 Gy in 17 fractions, Right breast boost treated to 7.5 Gy in 3 fractions   10/14/2015 -  Anti-estrogen oral therapy   Letrozole 2.5 mg daily   11/13/2015 Survivorship   Survivorship visit completed   02/02/2016 Mammogram   02/02/2016 Bilateral diagnostic mammogram showed lumpectomy change in the right breast, no evidence of malignancy.   10/19/2016 Imaging   Bone Density 10/19/16 ASSESSMENT: The BMD measured at Femur Neck Left is 0.987 g/cm2 with a T-score of -0.4.    02/03/2017 Mammogram   Diagnostic Mammogram Bilateral 02/03/17 IMPRESSION: No evidence of malignancy within either breast. Stable postsurgical changes within the right breast. RECOMMENDATION: Bilateral diagnostic mammogram in 1 year.   02/05/2018 Mammogram   02/05/2018 Mammogram IMPRESSION: 1. No mammographic evidence of malignancy in either breast. 2. Stable right breast  posttreatment changes.     ColonOncology History: Diagnosis of colon cancer dates back to 10/27/2008 when Kayla [name] presented with what turned out to be a bowel perforation through tumor. Stage at that time was T4 N1 M1 with pulmonary metastatic disease. The K-ras mutation was detected. Kayla [name] underwent  surgical resection of her tumor with a colostomy at the time of her surgery on 10/27/2008. The colostomy has been subsequently reversed on 09/20/2010. Kayla [name] received chemotherapy consisting of FOLFOX for 10 treatments from 12/10/2008 through 06/02/2009. She achieved a partial remission from these treatments. She then received additional chemotherapy with 5-FU, leucovorin, and 5-FU by continuous infusion along with Avastin from 06/23/2009 through 11/17/2009. 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 She was doing well without evidence of disease until she developed a right hemiparesthesia in September 2012 and was found to have a 3 x 3 x 2.4 cm, lobulated, enhancing mass in the left parietal lobe with marked surrounding edema. A PET scan on 03/25/2011 showed resolution of the previously identified pulmonary nodules with no residual hypermetabolic activity. The pericardial effusion, which has been present since diagnosis, was unchanged. There were also uterine fibroids and a low-density lesion in the anterior spleen that was stable and not associated with any hypermetabolic activity. Of note, Kayla [name] had a markedly elevated CEA up to 24.7 on 03/23/2011. On 06/23/2011, the CEA was less than 0.5. Dr. Erline Levine resected the recurrent metastatic colon cancer on 03/31/2011. This was followed by stereotactic radiation on 04/15/2011. The patient underwent a left-sided craniotomy and excision of the mass involving her left parietal lobe on 11/10/2011 by Dr. Erline Levine. The pathology report was negative for any malignancy. Pathology report indicated benign brain with fibrosis, hemorrhage, hemosiderin deposition, abundant dystrophic calcifications, and mixed acute and chronic inflammation including foreign body multinucleated giant cells.   Prior Therapy:  1. Kayla [name] underwent surgical resection of her tumor with a colostomy at the time of her surgery on 10/27/2008. The colostomy has  been subsequently reversed on 09/20/2010. Kayla [name] received chemotherapy consisting of FOLFOX for 10 treatments from 12/10/2008 through 06/02/2009. She achieved a partial remission from these treatments. She then received additional chemotherapy with 5-FU, leucovorin, and 5-FU by continuous infusion along with Avastin from 06/23/2009 through 11/17/2009. 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  2. See above for her breast cancer treatment history   CURRENT THERAPY: Adjuvant letrozole 2.'5mg'$  daily started on 10/14/15   INTERVAL HISTORY:  Kayla [name] is here for a follow up of Colon and right breast cancer. She was last seen by me 6 months ago. She presents to the clinic alone. She notes she is doing well. She notes she has received both her COVID19 vaccinations. She denies new pain. She notes she is eating well and gaining weight. She notes she has h/o of smoking off any on and stopped in 2010. She notes she still has mostly numbness and mild tingling of her right foot. She is not currently on Gabapentin, but is considering restarting. She also notes she still has hot flashes and right breast muscle cramps below her breast   REVIEW OF SYSTEMS:   Constitutional: Denies fevers, chills or abnormal weight loss (+) hot flashes  Eyes: Denies blurriness of vision Ears, nose, mouth, throat, and face: Denies mucositis or sore throat Respiratory: Denies cough, dyspnea or wheezes Cardiovascular: Denies palpitation, chest discomfort or lower extremity swelling Gastrointestinal:  Denies nausea, heartburn or change in bowel habits Skin: Denies abnormal  skin rashes Lymphatics: (+) Neuropathy of right foot  Neurological:Denies numbness, tingling or new weaknesses Behavioral/Psych: Mood is stable, no new changes  Breast: (+) right breast shooting pain intermittently  All other systems were reviewed with the patient and are negative.  MEDICAL HISTORY:  Past Medical History:    Diagnosis Date  . Allergy    sulfa  . Arthritis   . Blood transfusion   . 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 Colon cancer ([place])    colon/ 2010/surg/chemo  . Diverticulosis 07/22/2010   sigmoid colon  . Family history of breast cancer   . Family history of colon cancer   . GERD (gastroesophageal reflux disease)   . Headache(784.0)   . Heart murmur   . History of radiation therapy 04/15/11   17 Gy single fraction  l parietal brain metastais  . Hx of adenomatous colonic polyps 01/2019  . Hypertension   . LVH (left ventricular hypertrophy)    mod/severe. echo 1/11. EF 65-70%   . Neuromuscular disorder ([place])    peripheral neuropathy feet  . Pericardial effusion    echo 08/13/09  . Peripheral vascular disease ([place])   . Personal history of chemotherapy   . Personal history of radiation therapy   . S/P radiation therapy 08/18/15-09/14/15   right breast  . Shortness of breath   . Thrombocytopenia ([place])     SURGICAL HISTORY: Past Surgical History:  Procedure Laterality Date  . BREAST LUMPECTOMY Right 04/14/2015  . BREAST LUMPECTOMY WITH RADIOACTIVE SEED AND SENTINEL LYMPH NODE BIOPSY Right 04/14/2015   Procedure: RIGHT BREAST LUMPECTOMY WITH RADIOACTIVE SEED ANDAXILLARY SENTINEL LYMPH NODE BIOPSY;  Surgeon: Jackolyn Confer, MD;  Location: [facility];  Service: General;  Laterality: Right;  . COLON SURGERY  2010  . COLONOSCOPY  07/22/2010,11-20-13  . COLOSTOMY CLOSURE    . CRANIOTOMY  03/31/11   left parietal mass resection  . CRANIOTOMY  11/10/2011   Procedure: CRANIOTOMY TUMOR EXCISION;  Surgeon: Erline Levine, MD;  Location: [place];  Service: Neurosurgery;  Laterality: N/A;  Craniotomy for Biopsy of Tumor  . PORTACATH PLACEMENT     10  . ROTATOR CUFF REPAIR     rt  . TONSILLECTOMY    . TONSILLECTOMY    . TUBAL LIGATION      I have reviewed the social history and family history with the patient and they are unchanged from previous  note.  ALLERGIES:  is allergic to sulfonamide derivatives and sulfur.  MEDICATIONS:  Current Outpatient Medications  Medication Sig Dispense Refill  . acetaminophen (TYLENOL) 500 MG tablet Take 1,000 mg by mouth every 6 (six) hours as needed for mild pain, moderate pain or headache. Reported on 09/30/2015    . aspirin 81 MG chewable tablet aspirin 81 mg chewable tablet  Chew 1 tablet every day by oral route.    . fluticasone (FLONASE) 50 MCG/ACT nasal spray Place 1 spray into both nostrils daily as needed for allergies. Reported on 11/12/2015    . gabapentin (NEURONTIN) 300 MG capsule Take 1 capsule (300 mg total) by mouth at bedtime. 90 capsule 1  . letrozole (FEMARA) 2.5 MG tablet Take 1 tablet (2.5 mg total) by mouth daily. 90 tablet 3  . lisinopril (PRINIVIL,ZESTRIL) 40 MG tablet Take 1 tablet (40 mg total) by mouth daily. 90 tablet 0  . pravastatin (PRAVACHOL) 20 MG tablet Take 20 mg by mouth daily.     [name] VITAMIN D, ERGOCALCIFEROL,  PO Take 2 drops every other day by mouth. 2000 units     No current facility-administered medications for this visit.    PHYSICAL EXAMINATION: ECOG PERFORMANCE STATUS: 0 - Asymptomatic  Vitals:   09/30/19 1030  BP: (!) 124/91  Pulse: 84  Resp: 20  Temp: 98.3 F (36.8 C)  SpO2: 99%   Filed Weights   09/30/19 1030  Weight: 191 lb 4.8 oz (86.8 kg)    GENERAL:alert, no distress and comfortable SKIN: skin color, texture, turgor are normal, no rashes or significant lesions EYES: normal, Conjunctiva are pink and non-injected, sclera clear  NECK: supple, thyroid normal size, non-tender, without nodularity LYMPH:  no palpable lymphadenopathy in the cervical, axillary  LUNGS: clear to auscultation and percussion with normal breathing effort HEART: regular rate & rhythm and no murmurs and no lower extremity edema ABDOMEN:abdomen soft, non-tender and normal bowel sounds Musculoskeletal:no cyanosis of digits and no clubbing  NEURO: alert & oriented x 3  with fluent speech, no focal motor/sensory deficits BREAST: S/p right lumpectomy: Surgical incision healed well with mild skin hyperpigmentation. No palpable mass, nodules or adenopathy bilaterally. Breast exam benign.   LABORATORY DATA:  I have reviewed the data as listed CBC Latest Ref Rng & Units 09/30/2019 04/01/2019 05/30/2018  WBC 4.0 - 10.5 K/uL 8.2 9.0 8.1  Hemoglobin 12.0 - 15.0 g/dL 13.8 14.2 13.6  Hematocrit 36.0 - 46.0 % 43.9 44.7 43.1  Platelets 150 - 400 K/uL 241 222 230     CMP Latest Ref Rng & Units 09/30/2019 04/01/2019 05/30/2018  Glucose 70 - 99 mg/dL 118(H) 116(H) 104(H)  BUN 8 - 23 mg/dL _0 Creatinine 0.44 - 1.00 mg/dL 0.87 1.08(H) 0.80  Sodium 135 - 145 mmol/L 143 142 142  Potassium 3.5 - 5.1 mmol/L 4.4 4.1 4.1  Chloride 98 - 111 mmol/L 107 106 106  CO2 22 - 32 mmol/L _1 Calcium 8.9 - 10.3 mg/dL 9.6 10.1 9.8  Total Protein 6.5 - 8.1 g/dL 7.3 7.4 7.4  Total Bilirubin 0.3 - 1.2 mg/dL 0.6 0.8 0.8  Alkaline Phos 38 - 126 U/L 113 112 134(H)  AST 15 - 41 U/L _2 ALT 0 - 44 U/L _3 RADIOGRAPHIC STUDIES: I have personally reviewed the radiological images as listed and agreed with the findings in the report. No results found.   ASSESSMENT & PLAN:  Kayla [name] is a 69 y.o. female with     1. Right breat invasive ductal carcinoma, pT2N0M0, stage IIA, ER+/PR+, HER2-, Oncotype RS 30 -She was diagnosed in 02/2015. She is s/p right breast lumpectomy, adjuvantchemoand adjuvant radiation.  -She started antiestrogen therapy with letrozole in 09/2015. Will continue for a total of 5-10 years, likely 7 years, she tolerates wellwith manageable hot flashes  -Geneticstesting was negative -She is clinically doing well. Lab reviewed, her CBC and CMP are within normal limits except BG 118. Her physical exam and her 02/2019 mammogram were unremarkable. There is no clinical concern for recurrence. -Continue surveillance. Next mammogram in 02/2020.    -continue Letrozole. For hot flashes she can restart Gabapentin 352m.  -F/u 6 months -She has received both her COVID19 vaccinations.    2. 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 She is now out 6 years from the time of diagnosis and almost 4 years from the time  of diagnosis of her right brain recurrence.  -Brain MRI in 10/2015 was negative for recurrence. Brain MRI in 04/2016 was negative for recurrence. -Her last restaging CT scans in 01/2015 showed no evidence of recurrence -She ismore than 5.5years out of her brain recurrence, follow up with lab CBC, CMP and CEA every 6-12 months. I do not plan to do another surveillance CT scanunless she has concerning symptoms. -She is clinically doing very well. Her CEA level has been normal, today's result is still pending .    3. HTN, Weight gain  -Mostly controlled but elevated today at 124/91 (09/30/19).  -Continue medicationandcontinue monitoring BP at home  -I encouraged her to work on weight loss with low carb diet and exercise. She is agreeable.    4. Peripheral neuropathy, grade 1, secondary to prior chemotherapy  -Stable and mild.She still has mainly numbness and mild tingling. Given this can effect her at night, I refilled her Gabapentin 329m for her to restart (09/30/19) -I also advised her to take multivitamins with vitamin B complexes.   5. Bone health  -Since her calcium is slightly elevated and she is off calcium and Vit D -Bone density scan in 11/02/16 was normal (T-score -0.4) -DEXA scan from 02/2019 normal (T-score -0.6)   Plan -I refilled Gabapentin 3019mHS today  -She is clinically doing well and stable.  -Continue letrozole, refilled today -Lab and f/u in 6 months with Mammogram that morning of.    No problem-specific Assessment & Plan notes found for this encounter.   Orders Placed This Encounter   Procedures  . MM Digital Screening    Standing Status:   Future    Standing Expiration Date:   09/29/2020    Order Specific Question:   Reason for Exam (SYMPTOM  OR DIAGNOSIS REQUIRED)    Answer:   screening    Order Specific Question:   Preferred imaging location?    Answer:   [facility]SE[facility])   All questions were answered. The patient knows to call the clinic with any problems, questions or concerns. No barriers to learning was detected. The total time spent in the appointment was 25 minutes.     YaTruitt MerleMD 09/30/2019   I, AmJoslyn Devonam acting as scribe for YaTruitt MerleMD.   I have reviewed the above documentation for accuracy and completeness, and I agree with the above.

## 2019-09-30 ENCOUNTER — Inpatient Hospital Stay (HOSPITAL_BASED_OUTPATIENT_CLINIC_OR_DEPARTMENT_OTHER): Payer: Medicare HMO | Admitting: Hematology

## 2019-09-30 ENCOUNTER — Encounter: Payer: Self-pay | Admitting: Hematology

## 2019-09-30 ENCOUNTER — Other Ambulatory Visit: Payer: Self-pay

## 2019-09-30 ENCOUNTER — Inpatient Hospital Stay: Payer: Medicare HMO | Attending: Hematology

## 2019-09-30 VITALS — BP 124/91 | HR 84 | Temp 98.3°F | Resp 20 | Ht 64.0 in | Wt 191.3 lb

## 2019-09-30 DIAGNOSIS — Z1231 Encounter for screening mammogram for malignant neoplasm of breast: Secondary | ICD-10-CM | POA: Diagnosis not present

## 2019-09-30 DIAGNOSIS — C50211 Malignant neoplasm of upper-inner quadrant of right female breast: Secondary | ICD-10-CM | POA: Diagnosis not present

## 2019-09-30 DIAGNOSIS — Z79811 Long term (current) use of aromatase inhibitors: Secondary | ICD-10-CM | POA: Insufficient documentation

## 2019-09-30 DIAGNOSIS — C186 Malignant neoplasm of descending colon: Secondary | ICD-10-CM

## 2019-09-30 DIAGNOSIS — Z170 Estrogen receptor positive status [ER+]: Secondary | ICD-10-CM | POA: Diagnosis not present

## 2019-09-30 DIAGNOSIS — Z85038 Personal history of other malignant neoplasm of large intestine: Secondary | ICD-10-CM | POA: Insufficient documentation

## 2019-09-30 DIAGNOSIS — Z87891 Personal history of nicotine dependence: Secondary | ICD-10-CM | POA: Insufficient documentation

## 2019-09-30 DIAGNOSIS — I10 Essential (primary) hypertension: Secondary | ICD-10-CM | POA: Insufficient documentation

## 2019-09-30 DIAGNOSIS — Z9221 Personal history of antineoplastic chemotherapy: Secondary | ICD-10-CM | POA: Insufficient documentation

## 2019-09-30 DIAGNOSIS — Z923 Personal history of irradiation: Secondary | ICD-10-CM | POA: Diagnosis not present

## 2019-09-30 LAB — CBC WITH DIFFERENTIAL/PLATELET
Abs Immature Granulocytes: 0.08 10*3/uL — ABNORMAL HIGH (ref 0.00–0.07)
Basophils Absolute: 0.1 10*3/uL (ref 0.0–0.1)
Basophils Relative: 1 %
Eosinophils Absolute: 0.3 10*3/uL (ref 0.0–0.5)
Eosinophils Relative: 3 %
HCT: 43.9 % (ref 36.0–46.0)
Hemoglobin: 13.8 g/dL (ref 12.0–15.0)
Immature Granulocytes: 1 %
Lymphocytes Relative: 19 %
Lymphs Abs: 1.5 10*3/uL (ref 0.7–4.0)
MCH: 27.1 pg (ref 26.0–34.0)
MCHC: 31.4 g/dL (ref 30.0–36.0)
MCV: 86.2 fL (ref 80.0–100.0)
Monocytes Absolute: 0.6 10*3/uL (ref 0.1–1.0)
Monocytes Relative: 8 %
Neutro Abs: 5.7 10*3/uL (ref 1.7–7.7)
Neutrophils Relative %: 68 %
Platelets: 241 10*3/uL (ref 150–400)
RBC: 5.09 MIL/uL (ref 3.87–5.11)
RDW: 14.5 % (ref 11.5–15.5)
WBC: 8.2 10*3/uL (ref 4.0–10.5)
nRBC: 0 % (ref 0.0–0.2)

## 2019-09-30 LAB — COMPREHENSIVE METABOLIC PANEL
ALT: 29 U/L (ref 0–44)
AST: 22 U/L (ref 15–41)
Albumin: 3.7 g/dL (ref 3.5–5.0)
Alkaline Phosphatase: 113 U/L (ref 38–126)
Anion gap: 10 (ref 5–15)
BUN: 15 mg/dL (ref 8–23)
CO2: 26 mmol/L (ref 22–32)
Calcium: 9.6 mg/dL (ref 8.9–10.3)
Chloride: 107 mmol/L (ref 98–111)
Creatinine, Ser: 0.87 mg/dL (ref 0.44–1.00)
GFR calc Af Amer: >60 mL/min (ref >60)
GFR calc non Af Amer: >60 mL/min (ref >60)
Glucose, Bld: 118 mg/dL — ABNORMAL HIGH (ref 70–99)
Potassium: 4.4 mmol/L (ref 3.5–5.1)
Sodium: 143 mmol/L (ref 135–145)
Total Bilirubin: 0.6 mg/dL (ref 0.3–1.2)
Total Protein: 7.3 g/dL (ref 6.5–8.1)

## 2019-09-30 LAB — CEA (IN HOUSE-CHCC): CEA (CHCC-In House): 2.43 ng/mL (ref 0.00–5.00)

## 2019-09-30 MED ORDER — GABAPENTIN 300 MG PO CAPS: 300.0000 mg | ORAL_CAPSULE | Freq: Every day | ORAL | 1 refills | Status: DC

## 2019-09-30 MED ORDER — LETROZOLE 2.5 MG PO TABS: 2.5000 mg | ORAL_TABLET | Freq: Every day | ORAL | 3 refills | Status: DC

## 2019-10-01 ENCOUNTER — Telehealth: Payer: Self-pay | Admitting: Hematology

## 2019-10-01 NOTE — Telephone Encounter (Signed)
Scheduled appt per 3/15 los.  Sent a message to HIM pool to get a calendar mailed out.

## 2019-12-03 ENCOUNTER — Other Ambulatory Visit: Payer: Self-pay | Admitting: Hematology

## 2019-12-03 DIAGNOSIS — Z853 Personal history of malignant neoplasm of breast: Secondary | ICD-10-CM

## 2019-12-25 DIAGNOSIS — Z131 Encounter for screening for diabetes mellitus: Secondary | ICD-10-CM | POA: Diagnosis not present

## 2019-12-25 DIAGNOSIS — L910 Hypertrophic scar: Secondary | ICD-10-CM | POA: Diagnosis not present

## 2019-12-25 DIAGNOSIS — R202 Paresthesia of skin: Secondary | ICD-10-CM | POA: Diagnosis not present

## 2019-12-25 DIAGNOSIS — I10 Essential (primary) hypertension: Secondary | ICD-10-CM | POA: Diagnosis not present

## 2019-12-25 DIAGNOSIS — E782 Mixed hyperlipidemia: Secondary | ICD-10-CM | POA: Diagnosis not present

## 2019-12-25 DIAGNOSIS — C50211 Malignant neoplasm of upper-inner quadrant of right female breast: Secondary | ICD-10-CM | POA: Diagnosis not present

## 2019-12-25 DIAGNOSIS — C186 Malignant neoplasm of descending colon: Secondary | ICD-10-CM | POA: Diagnosis not present

## 2019-12-25 DIAGNOSIS — J302 Other seasonal allergic rhinitis: Secondary | ICD-10-CM | POA: Diagnosis not present

## 2020-01-29 DIAGNOSIS — I10 Essential (primary) hypertension: Secondary | ICD-10-CM | POA: Diagnosis not present

## 2020-01-29 DIAGNOSIS — R202 Paresthesia of skin: Secondary | ICD-10-CM | POA: Diagnosis not present

## 2020-01-29 DIAGNOSIS — E782 Mixed hyperlipidemia: Secondary | ICD-10-CM | POA: Diagnosis not present

## 2020-01-29 DIAGNOSIS — C186 Malignant neoplasm of descending colon: Secondary | ICD-10-CM | POA: Diagnosis not present

## 2020-01-29 DIAGNOSIS — J302 Other seasonal allergic rhinitis: Secondary | ICD-10-CM | POA: Diagnosis not present

## 2020-01-29 DIAGNOSIS — C50211 Malignant neoplasm of upper-inner quadrant of right female breast: Secondary | ICD-10-CM | POA: Diagnosis not present

## 2020-01-29 DIAGNOSIS — R7303 Prediabetes: Secondary | ICD-10-CM | POA: Diagnosis not present

## 2020-02-27 ENCOUNTER — Ambulatory Visit
Admission: RE | Admit: 2020-02-27 | Discharge: 2020-02-27 | Disposition: A | Discharge status: Home / Self Care | Payer: Medicare HMO | Source: Ambulatory Visit | Attending: Hematology | Admitting: Hematology

## 2020-02-27 ENCOUNTER — Other Ambulatory Visit: Payer: Self-pay

## 2020-02-27 DIAGNOSIS — Z853 Personal history of malignant neoplasm of breast: Secondary | ICD-10-CM

## 2020-02-27 DIAGNOSIS — R928 Other abnormal and inconclusive findings on diagnostic imaging of breast: Secondary | ICD-10-CM | POA: Diagnosis not present

## 2020-03-20 ENCOUNTER — Other Ambulatory Visit: Payer: Self-pay | Admitting: Hematology

## 2020-03-20 DIAGNOSIS — C186 Malignant neoplasm of descending colon: Secondary | ICD-10-CM

## 2020-03-27 ENCOUNTER — Other Ambulatory Visit: Payer: Self-pay | Admitting: Hematology

## 2020-03-27 DIAGNOSIS — C186 Malignant neoplasm of descending colon: Secondary | ICD-10-CM

## 2020-03-27 MED ORDER — GABAPENTIN 300 MG PO CAPS: 300.0000 mg | ORAL_CAPSULE | Freq: Every day | ORAL | 1 refills | Status: DC

## 2020-03-30 NOTE — Progress Notes (Signed)
[place]   [phone] [phone]   Clinic Follow up Note   Patient Care Team: Zhou-Talbert, Elwyn Lade, MD as PCP - General (Family Medicine) Mayer Masker, [place] (General Surgery) Kyung Rudd, MD as Consulting Physician (Radiation Oncology) Jackolyn Confer, MD as Consulting Physician (General Surgery) Truitt Merle, MD as Consulting Physician (Hematology) Sylvan Cheese, NP as Nurse Practitioner (Hematology and Oncology)  Date of Service:  04/01/2020  CHIEF COMPLAINT: F/u of colon and right breast cancer  SUMMARY OF ONCOLOGIC HISTORY: Oncology History Overview Note  Breast cancer of upper-inner quadrant of right female breast [facility])   Staging form: Breast, AJCC 7th Edition     Clinical: Stage IIA (T2, N0, M0) - Unsigned     Pathologic stage from 04/14/2015: Stage IIA (T2, N0, cM0) - Signed by Truitt Merle, MD on 05/05/2015 Cancer of left colon [facility])   Staging form: Colon and Rectum, AJCC 7th Edition     Pathologic: T4a, N1a, M1 - Unsigned      Breast cancer of upper-inner quadrant of right female breast ([place])  02/27/2015 Mammogram   Diagnostic mammogram showed a 2.5 cm irregular mass within the far posterior upper inner right breast, ultrasound confirmed a 1.9 x 1.0 x 0.8 cm mass at 1:30 o'clock 15 submitted from nipple. No axillary adenopathy   03/03/2015 Initial Biopsy   Right breast needle biopsy showed invasive ductal carcinoma, grade 1-2.   03/03/2015 Receptors her2   ER 100%+, PR 80%+, ki67 15%, HER2/neu negative   03/03/2015 Clinical Stage   Stage IIA: T2 N0   03/18/2015 Procedure   VUS at POLD1 gene called c.327G>C. neg at APC, ATM, AXIN2, BARD1, BMPR1A, BRCA1, BRCA2, BRIP1, CDH1, CDK4, CDKN2A, CHEK2, EPCAM, FANCC, MLH1, MSH2, MSH6, MUTYH, NBN, PALB2, PMS2, POLD1, POLE, PTEN, RAD51C, RAD51D, SCG5/GREM1, SMAD4, STK11, TP53, VHL,XRCC2   04/14/2015 Surgery   Right breast lumpectomy and sentinel lymph node biopsy. Surgical  margins were negative.   04/14/2015 Pathology Results   right breast invasive ductal carcinoma, grade 2, 2.7 cm,(+) DCIS, margins were negative, 4 sentinel lymph nodes and one axillary lymph nodes were negative, (+)lymphovascular invasion.   04/14/2015 Pathologic Stage   Stage IIA: T2 N0   04/14/2015 Oncotype testing   RS 30, which predicts 10-year risk of distance recurrence with tamoxifen alone 20%, intermediate risk   05/21/2015 - 07/24/2015 Adjuvant Chemotherapy    Adjuvant chemotherapy with docetaxel 75 mg/m, and Cytoxan 600 mg/m X 4 cycles   05/26/2015 - 05/29/2015 Hospital Admission   Pt was admitted for UTI and syncope, treated with antibiotics and IVF    08/17/2015 - 09/13/2015 Radiation Therapy   Adjuvant RT: Right breast treated to 42.5 Gy in 17 fractions, Right breast boost treated to 7.5 Gy in 3 fractions   10/14/2015 -  Anti-estrogen oral therapy   Letrozole 2.5 mg daily   11/13/2015 Survivorship   Survivorship visit completed   02/02/2016 Mammogram   02/02/2016 Bilateral diagnostic mammogram showed lumpectomy change in the right breast, no evidence of malignancy.   10/19/2016 Imaging   Bone Density 10/19/16 ASSESSMENT: The BMD measured at Femur Neck Left is 0.987 g/cm2 with a T-score of -0.4.    02/03/2017 Mammogram   Diagnostic Mammogram Bilateral 02/03/17 IMPRESSION: No evidence of malignancy within either breast. Stable postsurgical changes within the right breast. RECOMMENDATION: Bilateral diagnostic mammogram in 1 year.   02/05/2018 Mammogram   02/05/2018 Mammogram IMPRESSION: 1. No mammographic evidence of malignancy in either breast. 2. Stable right breast  posttreatment changes.     ColonOncology History: Diagnosis of colon cancer dates back to 10/27/2008 when [name] presented with what turned out to be a bowel perforation through tumor. Stage at that time was T4 N1 M1 with pulmonary metastatic disease. The K-ras mutation was detected. [name] underwent  surgical resection of her tumor with a colostomy at the time of her surgery on 10/27/2008. The colostomy has been subsequently reversed on 09/20/2010. Aleyah received chemotherapy consisting of FOLFOX for 10 treatments from 12/10/2008 through 06/02/2009. She achieved a partial remission from these treatments. She then received additional chemotherapy with 5-FU, leucovorin, and 5-FU by continuous infusion along with Avastin from 06/23/2009 through 11/17/2009. 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 She was doing well without evidence of disease until she developed a right hemiparesthesia in September 2012 and was found to have a 3 x 3 x 2.4 cm, lobulated, enhancing mass in the left parietal lobe with marked surrounding edema. A PET scan on 03/25/2011 showed resolution of the previously identified pulmonary nodules with no residual hypermetabolic activity. The pericardial effusion, which has been present since diagnosis, was unchanged. There were also uterine fibroids and a low-density lesion in the anterior spleen that was stable and not associated with any hypermetabolic activity. Of note, Rebekah had a markedly elevated CEA up to 24.7 on 03/23/2011. On 06/23/2011, the CEA was less than 0.5. Dr. Erline Levine resected the recurrent metastatic colon cancer on 03/31/2011. This was followed by stereotactic radiation on 04/15/2011. The patient underwent a left-sided craniotomy and excision of the mass involving her left parietal lobe on 11/10/2011 by Dr. Erline Levine. The pathology report was negative for any malignancy. Pathology report indicated benign brain with fibrosis, hemorrhage, hemosiderin deposition, abundant dystrophic calcifications, and mixed acute and chronic inflammation including foreign body multinucleated giant cells.   Prior Therapy:  1. [name] underwent surgical resection of her tumor with a colostomy at the time of her surgery on 10/27/2008. The colostomy has  been subsequently reversed on 09/20/2010. Kenna received chemotherapy consisting of FOLFOX for 10 treatments from 12/10/2008 through 06/02/2009. She achieved a partial remission from these treatments. She then received additional chemotherapy with 5-FU, leucovorin, and 5-FU by continuous infusion along with Avastin from 06/23/2009 through 11/17/2009. 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  2. See above for her breast cancer treatment history   CURRENT THERAPY: Adjuvant letrozole 2.79m daily started on 10/14/15   INTERVAL HISTORY:  DEPIFANIA LITTRELLis here for a follow up of breast cancer and colon cancer. She was last seen by me in 09/2019. She presents to the clinic alone. She notes she is doing well and stable. She denies new or concerning pain. She has adequate appetite. She is trying to lose weight with being more active. She is tolerating Letrozole well. She has tolerating hot flashes. She notes feeling "Whoozy" only when she bends down. She monitors her BP at home occasionally at 120-79 range. I reviewed her medication list with her. She has reduced her lisinopril to 165mand put her on HCTZ. She notes stable neuropathy in her feet. This is manageable on Gabapentin and can ambulate adequately. No recent falls and does not drive.  She notes she received both her COVID vaccine series. She plans to see her PCP in 04/2020. She notes she drinks 2 beer once a week.    REVIEW OF SYSTEMS:   Constitutional: Denies fevers, chills (+) Purposeful weight loss (+) Tolerable hot flashes (+)  Lightheadedness  Eyes: Denies blurriness of vision Ears, nose, mouth, throat, and face: Denies mucositis or sore throat Respiratory: Denies cough, dyspnea or wheezes Cardiovascular: Denies palpitation, chest discomfort or lower extremity swelling Gastrointestinal:  Denies nausea, heartburn or change in bowel habits Skin: Denies abnormal skin rashes Lymphatics: Denies new  lymphadenopathy or easy bruising Neurological:Denies numbness, tingling or new weaknesses Behavioral/Psych: Mood is stable, no new changes  All other systems were reviewed with the patient and are negative.  MEDICAL HISTORY:  Past Medical History:  Diagnosis Date  . Allergy    sulfa  . Arthritis   . Blood transfusion   . 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 Colon cancer ([place])    colon/ 2010/surg/chemo  . Diverticulosis 07/22/2010   sigmoid colon  . Family history of breast cancer   . Family history of colon cancer   . GERD (gastroesophageal reflux disease)   . Headache(784.0)   . Heart murmur   . History of radiation therapy 04/15/11   17 Gy single fraction  l parietal brain metastais  . Hx of adenomatous colonic polyps 01/2019  . Hypertension   . LVH (left ventricular hypertrophy)    mod/severe. echo 1/11. EF 65-70%   . Neuromuscular disorder ([place])    peripheral neuropathy feet  . Pericardial effusion    echo 08/13/09  . Peripheral vascular disease ([place])   . Personal history of chemotherapy 2016   Right Breast Cancer  . Personal history of radiation therapy 2016   Right Breast Cancer  . S/P radiation therapy 08/18/15-09/14/15   right breast  . Shortness of breath   . Thrombocytopenia ([place])     SURGICAL HISTORY: Past Surgical History:  Procedure Laterality Date  . BREAST LUMPECTOMY Right 04/14/2015  . BREAST LUMPECTOMY WITH RADIOACTIVE SEED AND SENTINEL LYMPH NODE BIOPSY Right 04/14/2015   Procedure: RIGHT BREAST LUMPECTOMY WITH RADIOACTIVE SEED ANDAXILLARY SENTINEL LYMPH NODE BIOPSY;  Surgeon: Jackolyn Confer, MD;  Location: [facility];  Service: General;  Laterality: Right;  . COLON SURGERY  2010  . COLONOSCOPY  07/22/2010,11-20-13  . COLOSTOMY CLOSURE    . CRANIOTOMY  03/31/11   left parietal mass resection  . CRANIOTOMY  11/10/2011   Procedure: CRANIOTOMY TUMOR EXCISION;  Surgeon: Erline Levine, MD;  Location: [place] [facility];   Service: Neurosurgery;  Laterality: N/A;  Craniotomy for Biopsy of Tumor  . PORTACATH PLACEMENT     10  . ROTATOR CUFF REPAIR     rt  . TONSILLECTOMY    . TONSILLECTOMY    . TUBAL LIGATION      I have reviewed the social history and family history with the patient and they are unchanged from previous note.  ALLERGIES:  is allergic to sulfonamide derivatives and sulfur.  MEDICATIONS:  Current Outpatient Medications  Medication Sig Dispense Refill  . acetaminophen (TYLENOL) 500 MG tablet Take 1,000 mg by mouth every 6 (six) hours as needed for mild pain, moderate pain or headache. Reported on 09/30/2015    . aspirin 81 MG chewable tablet aspirin 81 mg chewable tablet  Chew 1 tablet every day by oral route.    . fluticasone (FLONASE) 50 MCG/ACT nasal spray Place 1 spray into both nostrils daily as needed for allergies. Reported on 11/12/2015    . gabapentin (NEURONTIN) 300 MG capsule Take 1 capsule (300 mg total) by mouth at bedtime. 90 capsule 1  . letrozole (FEMARA) 2.5 MG tablet Take 1 tablet (2.5 mg  total) by mouth daily. 90 tablet 3  . lisinopril (PRINIVIL,ZESTRIL) 40 MG tablet Take 1 tablet (40 mg total) by mouth daily. 90 tablet 0  . pravastatin (PRAVACHOL) 20 MG tablet Take 20 mg by mouth daily.     [name] VITAMIN D, ERGOCALCIFEROL, PO Take 2 drops every other day by mouth. 2000 units     No current facility-administered medications for this visit.    PHYSICAL EXAMINATION: ECOG PERFORMANCE STATUS: 0 - Asymptomatic  Vitals:   04/01/20 1116  BP: (!) 122/97  Pulse: 86  Resp: 18  Temp: 98 F (36.7 C)  SpO2: 98%   Filed Weights   04/01/20 1116  Weight: 180 lb 14.4 oz (82.1 kg)    GENERAL:alert, no distress and comfortable SKIN: skin color, texture, turgor are normal, no rashes or significant lesions EYES: normal, Conjunctiva are pink and non-injected, sclera clear  NECK: supple, thyroid normal size, non-tender, without nodularity LYMPH:  no palpable lymphadenopathy in the  cervical, axillary  LUNGS: clear to auscultation and percussion with normal breathing effort HEART: regular rate & rhythm and no murmurs and no lower extremity edema ABDOMEN:abdomen soft, non-tender and normal bowel sounds Musculoskeletal:no cyanosis of digits and no clubbing (+) Surgical incisions healed well with mild scar tissue NEURO: alert & oriented x 3 with fluent speech, no focal motor/sensory deficits BREAST: s/p right lumpectomy: Surgical incision healed well with scr tissue. Left Breast exam benign.   LABORATORY DATA:  I have reviewed the data as listed CBC Latest Ref Rng & Units 04/01/2020 09/30/2019 04/01/2019  WBC 4.0 - 10.5 K/uL 7.9 8.2 9.0  Hemoglobin 12.0 - 15.0 g/dL 13.9 13.8 14.2  Hematocrit 36 - 46 % 42.9 43.9 44.7  Platelets 150 - 400 K/uL 216 241 222     CMP Latest Ref Rng & Units 04/01/2020 09/30/2019 04/01/2019  Glucose 70 - 99 mg/dL 110(H) 118(H) 116(H)  BUN 8 - 23 mg/dL _0 Creatinine 0.44 - 1.00 mg/dL 1.00 0.87 1.08(H)  Sodium 135 - 145 mmol/L 139 143 142  Potassium 3.5 - 5.1 mmol/L 4.0 4.4 4.1  Chloride 98 - 111 mmol/L 103 107 106  CO2 22 - 32 mmol/L _1 Calcium 8.9 - 10.3 mg/dL 10.0 9.6 10.1  Total Protein 6.5 - 8.1 g/dL 7.8 7.3 7.4  Total Bilirubin 0.3 - 1.2 mg/dL 1.3(H) 0.6 0.8  Alkaline Phos 38 - 126 U/L 128(H) 113 112  AST 15 - 41 U/L _2 ALT 0 - 44 U/L _3 RADIOGRAPHIC STUDIES: I have personally reviewed the radiological images as listed and agreed with the findings in the report. No results found.   ASSESSMENT & PLAN:  Kayla [name] is a 69 y.o. female with    1. Right breat invasive ductal carcinoma, pT2N0M0, stage IIA, ER+/PR+, HER2-, Oncotype RS 30 -She was diagnosed in 02/2015. She is s/p right breast lumpectomy, adjuvantchemoand adjuvant radiation.  -She started antiestrogen therapy with letrozole in 09/2015. Tolerating well with manageable hot flashes. Plan for 7 years. -Geneticstesting was negative for  pathogenetic mutations.  -From a breast cancer standpoint, she is clinically doing well. Lab reviewed, her CBC and CMP are within normal limits except BG 110, alk phos 128, tbili 1.3. Her physical exam and her 02/2020 mammogram were unremarkable. There is no clinical concern for recurrence. -Continue surveillance. Next mammogram in 02/2021 -Continue Letrozole. She is tolerating well  -F/u 6 months -She has received both her COVID19  vaccinations.    2. 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 She is now over 11 years from the time of diagnosis -She has remained NED on surveillance scans. Last CT scan in 2016 and Brain MRI in 2017. I do not planto scan unless she has concerning symptoms. -Today's CEA is still pending. Has been normal.    3. HTN, Weight gain  -Continue HTN medications. Managed by her PCP  -She has been able to purposefully lose weight by increased exercise. I encouraged her to continue.   4. Peripheral neuropathy, grade 1, secondary to prior chemotherapy  -Stable and moderate in feet. She no longer drives but no recent falls. She still has mainly numbness and -Continue Gabapentin 379m  5. Bone health  -DEXA scan from 02/2019 normal (T-score -0.6). Repeat in 2 years.   6. Elevated Alk Phos and mild hyperbilirubinemia  -Her labs today show Alk Phos 128 and tbili 1.3 (04/01/20)  -She notes she drinks 2 beer once a week. I discussed changing to non-alcoholic beer and stopping alcohol intake for now. She is willing to try.  -I recommend she repeat labs with her 04/2020 PCP visit. She is agreeable.     Plan -Continue Letrozole  -Lab and F/u with NP Lacie in 6 months  -Copy note to PCP   No problem-specific Assessment & Plan notes found for this encounter.   No orders of the defined types were placed in this encounter.  All questions were answered. The patient  knows to call the clinic with any problems, questions or concerns. No barriers to learning was detected. The total time spent in the appointment was 30 minutes.     YTruitt Merle MD 04/01/2020   I, AJoslyn Devon am acting as scribe for YTruitt Merle MD.   I have reviewed the above documentation for accuracy and completeness, and I agree with the above.

## 2020-04-01 ENCOUNTER — Other Ambulatory Visit: Payer: Self-pay

## 2020-04-01 ENCOUNTER — Encounter: Payer: Self-pay | Admitting: Hematology

## 2020-04-01 ENCOUNTER — Inpatient Hospital Stay: Payer: Medicare HMO | Attending: Hematology | Admitting: Hematology

## 2020-04-01 ENCOUNTER — Inpatient Hospital Stay: Payer: Medicare HMO

## 2020-04-01 ENCOUNTER — Telehealth: Payer: Self-pay | Admitting: Hematology

## 2020-04-01 VITALS — BP 122/97 | HR 86 | Temp 98.0°F | Resp 18 | Ht 64.0 in | Wt 180.9 lb

## 2020-04-01 DIAGNOSIS — M1990 Unspecified osteoarthritis, unspecified site: Secondary | ICD-10-CM | POA: Diagnosis not present

## 2020-04-01 DIAGNOSIS — R748 Abnormal levels of other serum enzymes: Secondary | ICD-10-CM | POA: Insufficient documentation

## 2020-04-01 DIAGNOSIS — G620 Drug-induced polyneuropathy: Secondary | ICD-10-CM | POA: Insufficient documentation

## 2020-04-01 DIAGNOSIS — Z85038 Personal history of other malignant neoplasm of large intestine: Secondary | ICD-10-CM | POA: Diagnosis present

## 2020-04-01 DIAGNOSIS — Z79899 Other long term (current) drug therapy: Secondary | ICD-10-CM | POA: Insufficient documentation

## 2020-04-01 DIAGNOSIS — K219 Gastro-esophageal reflux disease without esophagitis: Secondary | ICD-10-CM | POA: Diagnosis not present

## 2020-04-01 DIAGNOSIS — Z79811 Long term (current) use of aromatase inhibitors: Secondary | ICD-10-CM | POA: Insufficient documentation

## 2020-04-01 DIAGNOSIS — R011 Cardiac murmur, unspecified: Secondary | ICD-10-CM | POA: Diagnosis not present

## 2020-04-01 DIAGNOSIS — Z853 Personal history of malignant neoplasm of breast: Secondary | ICD-10-CM | POA: Insufficient documentation

## 2020-04-01 DIAGNOSIS — C50211 Malignant neoplasm of upper-inner quadrant of right female breast: Secondary | ICD-10-CM | POA: Diagnosis not present

## 2020-04-01 DIAGNOSIS — C186 Malignant neoplasm of descending colon: Secondary | ICD-10-CM

## 2020-04-01 DIAGNOSIS — Z9221 Personal history of antineoplastic chemotherapy: Secondary | ICD-10-CM | POA: Insufficient documentation

## 2020-04-01 DIAGNOSIS — I10 Essential (primary) hypertension: Secondary | ICD-10-CM | POA: Insufficient documentation

## 2020-04-01 DIAGNOSIS — Z170 Estrogen receptor positive status [ER+]: Secondary | ICD-10-CM

## 2020-04-01 DIAGNOSIS — Z7982 Long term (current) use of aspirin: Secondary | ICD-10-CM | POA: Insufficient documentation

## 2020-04-01 DIAGNOSIS — T451X5S Adverse effect of antineoplastic and immunosuppressive drugs, sequela: Secondary | ICD-10-CM | POA: Diagnosis not present

## 2020-04-01 DIAGNOSIS — Z923 Personal history of irradiation: Secondary | ICD-10-CM | POA: Diagnosis not present

## 2020-04-01 LAB — COMPREHENSIVE METABOLIC PANEL
ALT: 31 U/L (ref 0–44)
AST: 26 U/L (ref 15–41)
Albumin: 3.9 g/dL (ref 3.5–5.0)
Alkaline Phosphatase: 128 U/L — ABNORMAL HIGH (ref 38–126)
Anion gap: 7 (ref 5–15)
BUN: 23 mg/dL (ref 8–23)
CO2: 29 mmol/L (ref 22–32)
Calcium: 10 mg/dL (ref 8.9–10.3)
Chloride: 103 mmol/L (ref 98–111)
Creatinine, Ser: 1 mg/dL (ref 0.44–1.00)
GFR calc Af Amer: >60 mL/min (ref >60)
GFR calc non Af Amer: 58 mL/min — ABNORMAL LOW (ref >60)
Glucose, Bld: 110 mg/dL — ABNORMAL HIGH (ref 70–99)
Potassium: 4 mmol/L (ref 3.5–5.1)
Sodium: 139 mmol/L (ref 135–145)
Total Bilirubin: 1.3 mg/dL — ABNORMAL HIGH (ref 0.3–1.2)
Total Protein: 7.8 g/dL (ref 6.5–8.1)

## 2020-04-01 LAB — CBC WITH DIFFERENTIAL/PLATELET
Abs Immature Granulocytes: 0.04 10*3/uL (ref 0.00–0.07)
Basophils Absolute: 0 10*3/uL (ref 0.0–0.1)
Basophils Relative: 1 %
Eosinophils Absolute: 0.2 10*3/uL (ref 0.0–0.5)
Eosinophils Relative: 2 %
HCT: 42.9 % (ref 36.0–46.0)
Hemoglobin: 13.9 g/dL (ref 12.0–15.0)
Immature Granulocytes: 1 %
Lymphocytes Relative: 19 %
Lymphs Abs: 1.5 10*3/uL (ref 0.7–4.0)
MCH: 26.8 pg (ref 26.0–34.0)
MCHC: 32.4 g/dL (ref 30.0–36.0)
MCV: 82.7 fL (ref 80.0–100.0)
Monocytes Absolute: 0.7 10*3/uL (ref 0.1–1.0)
Monocytes Relative: 9 %
Neutro Abs: 5.5 10*3/uL (ref 1.7–7.7)
Neutrophils Relative %: 68 %
Platelets: 216 10*3/uL (ref 150–400)
RBC: 5.19 MIL/uL — ABNORMAL HIGH (ref 3.87–5.11)
RDW: 14.8 % (ref 11.5–15.5)
WBC: 7.9 10*3/uL (ref 4.0–10.5)
nRBC: 0 % (ref 0.0–0.2)

## 2020-04-01 LAB — CEA (IN HOUSE-CHCC): CEA (CHCC-In House): 1.9 ng/mL (ref 0.00–5.00)

## 2020-04-01 NOTE — Telephone Encounter (Signed)
Scheduled appointments per 9/15 los. Gave patient calendar print out.

## 2020-04-28 DIAGNOSIS — C186 Malignant neoplasm of descending colon: Secondary | ICD-10-CM | POA: Diagnosis not present

## 2020-04-28 DIAGNOSIS — E782 Mixed hyperlipidemia: Secondary | ICD-10-CM | POA: Diagnosis not present

## 2020-04-28 DIAGNOSIS — R7303 Prediabetes: Secondary | ICD-10-CM | POA: Diagnosis not present

## 2020-04-28 DIAGNOSIS — I10 Essential (primary) hypertension: Secondary | ICD-10-CM | POA: Diagnosis not present

## 2020-04-28 DIAGNOSIS — C50211 Malignant neoplasm of upper-inner quadrant of right female breast: Secondary | ICD-10-CM | POA: Diagnosis not present

## 2020-04-28 DIAGNOSIS — J302 Other seasonal allergic rhinitis: Secondary | ICD-10-CM | POA: Diagnosis not present

## 2020-09-08 ENCOUNTER — Telehealth: Payer: Self-pay | Admitting: Nurse Practitioner

## 2020-09-08 NOTE — Telephone Encounter (Signed)
Contacted patient about new appointment time. Per providers template. Patient is aware.

## 2020-09-24 ENCOUNTER — Telehealth: Payer: Self-pay | Admitting: Hematology

## 2020-09-24 NOTE — Telephone Encounter (Signed)
R/s appointment from 3/14 per 3/9 sch msg. Spoke to patient who is aware of updated appointments date and times.

## 2020-09-25 NOTE — Progress Notes (Signed)
[place]   [phone] [phone]   Clinic Follow up Note   Patient Care Team: Zhou-Talbert, Elwyn Lade, MD as PCP - General (Family Medicine) Mayer Masker, [place] (General Surgery) Kyung Rudd, MD as Consulting Physician (Radiation Oncology) Jackolyn Confer, MD as Consulting Physician (General Surgery) Truitt Merle, MD as Consulting Physician (Hematology) Sylvan Cheese, NP as Nurse Practitioner (Hematology and Oncology)  Date of Service:  09/29/2020  CHIEF COMPLAINT: F/u of colon and right breast cancer  SUMMARY OF ONCOLOGIC HISTORY: Oncology History Overview Note  Breast cancer of upper-inner quadrant of right female breast [facility])   Staging form: Breast, AJCC 7th Edition     Clinical: Stage IIA (T2, N0, M0) - Unsigned     Pathologic stage from 04/14/2015: Stage IIA (T2, N0, cM0) - Signed by Truitt Merle, MD on 05/05/2015 Cancer of left colon [facility])   Staging form: Colon and Rectum, AJCC 7th Edition     Pathologic: T4a, N1a, M1 - Unsigned      Breast cancer of upper-inner quadrant of right female breast ([place])  02/27/2015 Mammogram   Diagnostic mammogram showed a 2.5 cm irregular mass within the far posterior upper inner right breast, ultrasound confirmed a 1.9 x 1.0 x 0.8 cm mass at 1:30 o'clock 15 submitted from nipple. No axillary adenopathy   03/03/2015 Initial Biopsy   Right breast needle biopsy showed invasive ductal carcinoma, grade 1-2.   03/03/2015 Receptors her2   ER 100%+, PR 80%+, ki67 15%, HER2/neu negative   03/03/2015 Clinical Stage   Stage IIA: T2 N0   03/18/2015 Procedure   VUS at POLD1 gene called c.327G>C. neg at APC, ATM, AXIN2, BARD1, BMPR1A, BRCA1, BRCA2, BRIP1, CDH1, CDK4, CDKN2A, CHEK2, EPCAM, FANCC, MLH1, MSH2, MSH6, MUTYH, NBN, PALB2, PMS2, POLD1, POLE, PTEN, RAD51C, RAD51D, SCG5/GREM1, SMAD4, STK11, TP53, VHL,XRCC2   04/14/2015 Surgery   Right breast lumpectomy and sentinel lymph node biopsy. Surgical  margins were negative.   04/14/2015 Pathology Results   right breast invasive ductal carcinoma, grade 2, 2.7 cm,(+) DCIS, margins were negative, 4 sentinel lymph nodes and one axillary lymph nodes were negative, (+)lymphovascular invasion.   04/14/2015 Pathologic Stage   Stage IIA: T2 N0   04/14/2015 Oncotype testing   RS 30, which predicts 10-year risk of distance recurrence with tamoxifen alone 20%, intermediate risk   05/21/2015 - 07/24/2015 Adjuvant Chemotherapy    Adjuvant chemotherapy with docetaxel 75 mg/m, and Cytoxan 600 mg/m X 4 cycles   05/26/2015 - 05/29/2015 Hospital Admission   Pt was admitted for UTI and syncope, treated with antibiotics and IVF    08/17/2015 - 09/13/2015 Radiation Therapy   Adjuvant RT: Right breast treated to 42.5 Gy in 17 fractions, Right breast boost treated to 7.5 Gy in 3 fractions   10/14/2015 -  Anti-estrogen oral therapy   Letrozole 2.5 mg daily   11/13/2015 Survivorship   Survivorship visit completed   02/02/2016 Mammogram   02/02/2016 Bilateral diagnostic mammogram showed lumpectomy change in the right breast, no evidence of malignancy.   10/19/2016 Imaging   Bone Density 10/19/16 ASSESSMENT: The BMD measured at Femur Neck Left is 0.987 g/cm2 with a T-score of -0.4.    02/03/2017 Mammogram   Diagnostic Mammogram Bilateral 02/03/17 IMPRESSION: No evidence of malignancy within either breast. Stable postsurgical changes within the right breast. RECOMMENDATION: Bilateral diagnostic mammogram in 1 year.   02/05/2018 Mammogram   02/05/2018 Mammogram IMPRESSION: 1. No mammographic evidence of malignancy in either breast. 2. Stable right breast  posttreatment changes.      ColonOncology History: Diagnosis of colon cancer dates back to 10/27/2008 when [name] presented with what turned out to be a bowel perforation through tumor. Stage at that time was T4 N1 M1 with pulmonary metastatic disease. The K-ras mutation was detected. [name] underwent  surgical resection of her tumor with a colostomy at the time of her surgery on 10/27/2008. The colostomy has been subsequently reversed on 09/20/2010. Cheila received chemotherapy consisting of FOLFOX for 10 treatments from 12/10/2008 through 06/02/2009. She achieved a partial remission from these treatments. She then received additional chemotherapy with 5-FU, leucovorin, and 5-FU by continuous infusion along with Avastin from 06/23/2009 through 11/17/2009. 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 She was doing well without evidence of disease until she developed a right hemiparesthesia in September 2012 and was found to have a 3 x 3 x 2.4 cm, lobulated, enhancing mass in the left parietal lobe with marked surrounding edema. A PET scan on 03/25/2011 showed resolution of the previously identified pulmonary nodules with no residual hypermetabolic activity. The pericardial effusion, which has been present since diagnosis, was unchanged. There were also uterine fibroids and a low-density lesion in the anterior spleen that was stable and not associated with any hypermetabolic activity. Of note, Siboney had a markedly elevated CEA up to 24.7 on 03/23/2011. On 06/23/2011, the CEA was less than 0.5. Dr. Erline [name] resected the recurrent metastatic colon cancer on 03/31/2011. This was followed by stereotactic radiation on 04/15/2011. The patient underwent a left-sided craniotomy and excision of the mass involving her left parietal lobe on 11/10/2011 by Dr. Erline [name]. The pathology report was negative for any malignancy. Pathology report indicated benign brain with fibrosis, hemorrhage, hemosiderin deposition, abundant dystrophic calcifications, and mixed acute and chronic inflammation including foreign body multinucleated giant cells.   Prior Therapy:  1. [name] underwent surgical resection of her tumor with a colostomy at the time of her surgery on 10/27/2008. The colostomy has  been subsequently reversed on 09/20/2010. Brileigh received chemotherapy consisting of FOLFOX for 10 treatments from 12/10/2008 through 06/02/2009. She achieved a partial remission from these treatments. She then received additional chemotherapy with 5-FU, leucovorin, and 5-FU by continuous infusion along with Avastin from 06/23/2009 through 11/17/2009. 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  2. See above for her breast cancer treatment history   CURRENT THERAPY:  Adjuvant letrozole 2.75m daily started on 10/14/15  INTERVAL HISTORY:  DMARISHKA RENTFROWis here for a follow up of right breast cancer. She was last seen by me 6 months ago. She presents to the clinic alone. She is doing well overall Occasional join pain, right shoulder pain yesterday but better today  Tolerates letrozole well, noticed hair growing at chin  Mild intermittent hot flushes, join stiffness tolerable  She is retired Appetite good, weight stable   All other systems were reviewed with the patient and are negative.  MEDICAL HISTORY:  Past Medical History:  Diagnosis Date  . Allergy    sulfa  . Arthritis   . Blood transfusion   . 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 [place]    colon/ 2010/surg/chemo  . Diverticulosis 07/22/2010   sigmoid colon  . Family history of breast cancer   . Family history of colon cancer   . GERD (gastroesophageal reflux disease)   . Headache(784.0)   . Heart murmur   . History of  radiation therapy 04/15/11   17 Gy single fraction  l parietal brain metastais  . Hx of adenomatous colonic polyps 01/2019  . Hypertension   . LVH (left ventricular hypertrophy)    mod/severe. echo 1/11. EF 65-70%   . Neuromuscular disorder ([place])    peripheral neuropathy feet  . Pericardial effusion    echo 08/13/09  . Peripheral vascular disease ([place])   . Personal history of chemotherapy 2016   Right Breast Cancer  . Personal history of  radiation therapy 2016   Right Breast Cancer  . S/P radiation therapy 08/18/15-09/14/15   right breast  . Shortness of breath   . Thrombocytopenia ([place])     SURGICAL HISTORY: Past Surgical History:  Procedure Laterality Date  . BREAST LUMPECTOMY Right 04/14/2015  . BREAST LUMPECTOMY WITH RADIOACTIVE SEED AND SENTINEL LYMPH NODE BIOPSY Right 04/14/2015   Procedure: RIGHT BREAST LUMPECTOMY WITH RADIOACTIVE SEED ANDAXILLARY SENTINEL LYMPH NODE BIOPSY;  Surgeon: Jackolyn Confer, MD;  Location: [facility];  Service: General;  Laterality: Right;  . COLON SURGERY  2010  . COLONOSCOPY  07/22/2010,11-20-13  . COLOSTOMY CLOSURE    . CRANIOTOMY  03/31/11   left parietal mass resection  . CRANIOTOMY  11/10/2011   Procedure: CRANIOTOMY TUMOR EXCISION;  Surgeon: Kayla Levine, MD;  Location: [place] [facility];  Service: Neurosurgery;  Laterality: N/A;  Craniotomy for Biopsy of Tumor  . PORTACATH PLACEMENT     10  . ROTATOR CUFF REPAIR     rt  . TONSILLECTOMY    . TONSILLECTOMY    . TUBAL LIGATION      I have reviewed the social history and family history with the patient and they are unchanged from previous note.  ALLERGIES:  is allergic to elemental sulfur and sulfonamide derivatives.  MEDICATIONS:  Current Outpatient Medications  Medication Sig Dispense Refill  . acetaminophen (TYLENOL) 500 MG tablet Take 1,000 mg by mouth every 6 (six) hours as needed for mild pain, moderate pain or headache. Reported on 09/30/2015    . aspirin 81 MG chewable tablet aspirin 81 mg chewable tablet  Chew 1 tablet every day by oral route.    . fluticasone (FLONASE) 50 MCG/ACT nasal spray Place 1 spray into both nostrils daily as needed for allergies. Reported on 11/12/2015    . gabapentin (NEURONTIN) 300 MG capsule Take 1 capsule (300 mg total) by mouth at bedtime. 90 capsule 1  . letrozole (FEMARA) 2.5 MG tablet Take 1 tablet (2.5 mg total) by mouth daily. 90 tablet 3  . lisinopril (PRINIVIL,ZESTRIL) 40  MG tablet Take 1 tablet (40 mg total) by mouth daily. 90 tablet 0  . pravastatin (PRAVACHOL) 20 MG tablet Take 20 mg by mouth daily.     Kayla [name] VITAMIN D, ERGOCALCIFEROL, PO Take 2 drops every other day by mouth. 2000 units     No current facility-administered medications for this visit.    PHYSICAL EXAMINATION: ECOG PERFORMANCE STATUS: 1 - Symptomatic but completely ambulatory  Vitals:   09/29/20 0958  BP: 115/80  Pulse: 90  Resp: 16  Temp: 98.1 F (36.7 C)  SpO2: 99%   Filed Weights   09/29/20 0958  Weight: 182 lb 4.8 oz (82.7 kg)    GENERAL:alert, no distress and comfortable SKIN: skin color, texture, turgor are normal, no rashes or significant lesions EYES: normal, Conjunctiva are pink and non-injected, sclera clear NECK: supple, thyroid normal size, non-tender, without nodularity LYMPH:  no palpable lymphadenopathy in the cervical, axillary  LUNGS: clear  to auscultation and percussion with normal breathing effort HEART: regular rate & rhythm and no murmurs and no lower extremity edema ABDOMEN:abdomen soft, non-tender and normal bowel sounds Musculoskeletal:no cyanosis of digits and no clubbing  NEURO: alert & oriented x 3 with fluent speech, no focal motor/sensory deficits Breasts: Breast inspection showed them to be symmetrical with no nipple discharge. Surgical incsion in right breast has healed well without scar tissue. Palpation of the breasts and axilla revealed no obvious mass that I could appreciate.   LABORATORY DATA:  I have reviewed the data as listed CBC Latest Ref Rng & Units 09/29/2020 04/01/2020 09/30/2019  WBC 4.0 - 10.5 K/uL 8.7 7.9 8.2  Hemoglobin 12.0 - 15.0 g/dL 14.8 13.9 13.8  Hematocrit 36.0 - 46.0 % 45.2 42.9 43.9  Platelets 150 - 400 K/uL 248 216 241     CMP Latest Ref Rng & Units 09/29/2020 04/01/2020 09/30/2019  Glucose 70 - 99 mg/dL 126(H) 110(H) 118(H)  BUN 8 - 23 mg/dL 19 23 15   Creatinine 0.44 - 1.00 mg/dL 1.02(H) 1.00 0.87  Sodium 135 - 145  mmol/L 138 139 143  Potassium 3.5 - 5.1 mmol/L 3.9 4.0 4.4  Chloride 98 - 111 mmol/L 101 103 107  CO2 22 - 32 mmol/L 29 29 26   Calcium 8.9 - 10.3 mg/dL 9.9 10.0 9.6  Total Protein 6.5 - 8.1 g/dL 7.8 7.8 7.3  Total Bilirubin 0.3 - 1.2 mg/dL 0.9 1.3(H) 0.6  Alkaline Phos 38 - 126 U/L 106 128(H) 113  AST 15 - 41 U/L 26 26 22   ALT 0 - 44 U/L 36 31 29      RADIOGRAPHIC STUDIES: I have personally reviewed the radiological images as listed and agreed with the findings in the report. No results found.   ASSESSMENT & PLAN:  ERMINIE FOULKS is a 70 y.o. female with    1. Right breat invasive ductal carcinoma, pT2N0M0, stage IIA, ER+/PR+, HER2-, Oncotype RS 30 -She was diagnosed in 02/2015. She is s/p right breast lumpectomy, adjuvantchemoand adjuvant radiation.  -She started antiestrogen therapy with letrozole in 09/2015. Tolerating well with manageable hot flashes. Plan for 7 years. -Geneticstesting was negative for pathogenetic mutations.  -She is clinically doing well. Lab reviewed, her CBC and CMP are within normal limits. Her physical exam and her 02/2020 mammogram were unremarkable. There is no clinical concern for recurrence. -Continue surveillance. Next mammogram in 02/2021 -Continue Letrozole. She is tolerating well, wilt mild hot flashes and joint stiffness.  Due to high risk of disease, I recommend her a total of 7-year course. -F/u 6 months   2. 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 She is now over 11 years from the time of diagnosis -She has remained NED on surveillance scans. Last CT scan in 2016 and Brain MRI in 2017. I do not planto scan unless she has concerning symptoms.  3. HTN, Weight gain -Continue HTN medications. Managed by her PCP  -She has been able to purposefully lose weight by increased exercise. I encouraged her to continue.  -Her weight has been  stable lately  4. Bone health  -DEXA scan from 02/2019 normal (T-score -0.6). Repeat in 02/2021  5. Elevated Alk Phos and mild hyperbilirubinemia  -resolved now  -she has stopped alcohol drinking    Plan -She is clinically doing well -Continue Letrozole  -Lab and F/u in 6 months, with mammogram nd DEXA  in August 2020.   No problem-specific Assessment & Plan notes found for this encounter.   Orders Placed This Encounter  Procedures  . MM Digital Screening    Standing Status:   Future    Standing Expiration Date:   09/29/2021    Order Specific Question:   Reason for Exam (SYMPTOM  OR DIAGNOSIS REQUIRED)    Answer:   screening    Order Specific Question:   Preferred imaging location?    Answer:   [facility]  . DG Bone Density    Standing Status:   Future    Standing Expiration Date:   09/29/2021    Order Specific Question:   Reason for Exam (SYMPTOM  OR DIAGNOSIS REQUIRED)    Answer:   screening    Order Specific Question:   Preferred imaging location?    Answer:   [facility]  . CEA (IN HOUSE-[facility])   All questions were answered. The patient knows to call the clinic with any problems, questions or concerns. No barriers to learning was detected. The total time spent in the appointment was 30 minutes.     Truitt Merle, MD 09/29/2020   I, Joslyn Devon, am acting as scribe for Truitt Merle, MD.   I have reviewed the above documentation for accuracy and completeness, and I agree with the above.

## 2020-09-28 ENCOUNTER — Inpatient Hospital Stay: Payer: Medicare HMO | Admitting: Nurse Practitioner

## 2020-09-28 ENCOUNTER — Inpatient Hospital Stay: Payer: Medicare HMO

## 2020-09-29 ENCOUNTER — Inpatient Hospital Stay (HOSPITAL_BASED_OUTPATIENT_CLINIC_OR_DEPARTMENT_OTHER): Payer: Medicare HMO | Admitting: Hematology

## 2020-09-29 ENCOUNTER — Encounter: Payer: Self-pay | Admitting: Hematology

## 2020-09-29 ENCOUNTER — Other Ambulatory Visit: Payer: Self-pay

## 2020-09-29 ENCOUNTER — Inpatient Hospital Stay: Payer: Medicare HMO | Attending: Hematology

## 2020-09-29 VITALS — BP 115/80 | HR 90 | Temp 98.1°F | Resp 16 | Ht 64.0 in | Wt 182.3 lb

## 2020-09-29 DIAGNOSIS — E2839 Other primary ovarian failure: Secondary | ICD-10-CM

## 2020-09-29 DIAGNOSIS — Z79899 Other long term (current) drug therapy: Secondary | ICD-10-CM | POA: Insufficient documentation

## 2020-09-29 DIAGNOSIS — Z9221 Personal history of antineoplastic chemotherapy: Secondary | ICD-10-CM | POA: Diagnosis not present

## 2020-09-29 DIAGNOSIS — Z1231 Encounter for screening mammogram for malignant neoplasm of breast: Secondary | ICD-10-CM

## 2020-09-29 DIAGNOSIS — G709 Myoneural disorder, unspecified: Secondary | ICD-10-CM | POA: Insufficient documentation

## 2020-09-29 DIAGNOSIS — I10 Essential (primary) hypertension: Secondary | ICD-10-CM

## 2020-09-29 DIAGNOSIS — C186 Malignant neoplasm of descending colon: Secondary | ICD-10-CM | POA: Diagnosis not present

## 2020-09-29 DIAGNOSIS — C50211 Malignant neoplasm of upper-inner quadrant of right female breast: Secondary | ICD-10-CM

## 2020-09-29 DIAGNOSIS — Z7982 Long term (current) use of aspirin: Secondary | ICD-10-CM | POA: Insufficient documentation

## 2020-09-29 DIAGNOSIS — R011 Cardiac murmur, unspecified: Secondary | ICD-10-CM | POA: Diagnosis not present

## 2020-09-29 DIAGNOSIS — Z79811 Long term (current) use of aromatase inhibitors: Secondary | ICD-10-CM | POA: Diagnosis not present

## 2020-09-29 DIAGNOSIS — Z170 Estrogen receptor positive status [ER+]: Secondary | ICD-10-CM

## 2020-09-29 DIAGNOSIS — Z923 Personal history of irradiation: Secondary | ICD-10-CM | POA: Insufficient documentation

## 2020-09-29 DIAGNOSIS — I739 Peripheral vascular disease, unspecified: Secondary | ICD-10-CM | POA: Insufficient documentation

## 2020-09-29 DIAGNOSIS — Z85038 Personal history of other malignant neoplasm of large intestine: Secondary | ICD-10-CM | POA: Insufficient documentation

## 2020-09-29 LAB — CBC WITH DIFFERENTIAL/PLATELET
Abs Immature Granulocytes: 0.08 10*3/uL — ABNORMAL HIGH (ref 0.00–0.07)
Basophils Absolute: 0 10*3/uL (ref 0.0–0.1)
Basophils Relative: 0 %
Eosinophils Absolute: 0.2 10*3/uL (ref 0.0–0.5)
Eosinophils Relative: 2 %
HCT: 45.2 % (ref 36.0–46.0)
Hemoglobin: 14.8 g/dL (ref 12.0–15.0)
Immature Granulocytes: 1 %
Lymphocytes Relative: 17 %
Lymphs Abs: 1.5 10*3/uL (ref 0.7–4.0)
MCH: 27.3 pg (ref 26.0–34.0)
MCHC: 32.7 g/dL (ref 30.0–36.0)
MCV: 83.2 fL (ref 80.0–100.0)
Monocytes Absolute: 0.7 10*3/uL (ref 0.1–1.0)
Monocytes Relative: 8 %
Neutro Abs: 6.2 10*3/uL (ref 1.7–7.7)
Neutrophils Relative %: 72 %
Platelets: 248 10*3/uL (ref 150–400)
RBC: 5.43 MIL/uL — ABNORMAL HIGH (ref 3.87–5.11)
RDW: 15 % (ref 11.5–15.5)
WBC: 8.7 10*3/uL (ref 4.0–10.5)
nRBC: 0 % (ref 0.0–0.2)

## 2020-09-29 LAB — COMPREHENSIVE METABOLIC PANEL
ALT: 36 U/L (ref 0–44)
AST: 26 U/L (ref 15–41)
Albumin: 4 g/dL (ref 3.5–5.0)
Alkaline Phosphatase: 106 U/L (ref 38–126)
Anion gap: 8 (ref 5–15)
BUN: 19 mg/dL (ref 8–23)
CO2: 29 mmol/L (ref 22–32)
Calcium: 9.9 mg/dL (ref 8.9–10.3)
Chloride: 101 mmol/L (ref 98–111)
Creatinine, Ser: 1.02 mg/dL — ABNORMAL HIGH (ref 0.44–1.00)
GFR, Estimated: >60 mL/min (ref >60)
Glucose, Bld: 126 mg/dL — ABNORMAL HIGH (ref 70–99)
Potassium: 3.9 mmol/L (ref 3.5–5.1)
Sodium: 138 mmol/L (ref 135–145)
Total Bilirubin: 0.9 mg/dL (ref 0.3–1.2)
Total Protein: 7.8 g/dL (ref 6.5–8.1)

## 2020-09-29 LAB — CEA (IN HOUSE-CHCC): CEA (CHCC-In House): 1.73 ng/mL (ref 0.00–5.00)

## 2020-10-06 ENCOUNTER — Telehealth: Payer: Self-pay

## 2020-10-06 NOTE — Telephone Encounter (Signed)
I spoke with Kayla [name] and relayed Kayla [name]'s comments.  She verbalized understanding.

## 2020-10-06 NOTE — Telephone Encounter (Signed)
-----   Message from Alla Feeling, NP sent at 10/05/2020  9:34 PM EDT ----- Please let her know CEA from 09/29/20 remains normal, no concerns.  Thanks, Regan Rakers, NP

## 2020-10-09 ENCOUNTER — Other Ambulatory Visit: Payer: Self-pay

## 2020-10-09 ENCOUNTER — Other Ambulatory Visit: Payer: Self-pay | Admitting: Hematology

## 2020-10-09 DIAGNOSIS — C186 Malignant neoplasm of descending colon: Secondary | ICD-10-CM

## 2020-10-09 MED ORDER — GABAPENTIN 300 MG PO CAPS: 300.0000 mg | ORAL_CAPSULE | Freq: Every day | ORAL | 0 refills | Status: DC

## 2020-10-09 MED ORDER — GABAPENTIN 300 MG PO CAPS: 300.0000 mg | ORAL_CAPSULE | Freq: Every day | ORAL | 3 refills | Status: DC

## 2020-11-06 ENCOUNTER — Other Ambulatory Visit: Payer: Self-pay | Admitting: Neurology

## 2020-11-06 ENCOUNTER — Other Ambulatory Visit (HOSPITAL_COMMUNITY): Payer: Self-pay | Admitting: Neurology

## 2020-11-06 DIAGNOSIS — G959 Disease of spinal cord, unspecified: Secondary | ICD-10-CM

## 2020-11-27 ENCOUNTER — Ambulatory Visit (HOSPITAL_COMMUNITY)
Admission: RE | Admit: 2020-11-27 | Discharge: 2020-11-27 | Disposition: A | Discharge status: Home / Self Care | Payer: Medicare HMO | Source: Ambulatory Visit | Attending: Neurology | Admitting: Neurology

## 2020-11-27 ENCOUNTER — Other Ambulatory Visit: Payer: Self-pay

## 2020-11-27 DIAGNOSIS — G959 Disease of spinal cord, unspecified: Secondary | ICD-10-CM | POA: Insufficient documentation

## 2020-11-30 ENCOUNTER — Other Ambulatory Visit: Payer: Self-pay | Admitting: Hematology

## 2020-11-30 DIAGNOSIS — C50211 Malignant neoplasm of upper-inner quadrant of right female breast: Secondary | ICD-10-CM

## 2020-11-30 DIAGNOSIS — Z170 Estrogen receptor positive status [ER+]: Secondary | ICD-10-CM

## 2020-12-02 ENCOUNTER — Other Ambulatory Visit: Payer: Self-pay

## 2020-12-02 DIAGNOSIS — C50211 Malignant neoplasm of upper-inner quadrant of right female breast: Secondary | ICD-10-CM

## 2020-12-02 DIAGNOSIS — Z170 Estrogen receptor positive status [ER+]: Secondary | ICD-10-CM

## 2020-12-02 MED ORDER — LETROZOLE 2.5 MG PO TABS: 2.5000 mg | ORAL_TABLET | Freq: Every day | ORAL | 2 refills | Status: DC

## 2021-03-25 ENCOUNTER — Other Ambulatory Visit: Payer: Self-pay

## 2021-03-25 ENCOUNTER — Ambulatory Visit
Admission: RE | Admit: 2021-03-25 | Discharge: 2021-03-25 | Disposition: A | Discharge status: Home / Self Care | Payer: Medicare HMO | Source: Ambulatory Visit | Attending: Hematology | Admitting: Hematology

## 2021-03-25 DIAGNOSIS — Z1231 Encounter for screening mammogram for malignant neoplasm of breast: Secondary | ICD-10-CM

## 2021-03-25 DIAGNOSIS — E2839 Other primary ovarian failure: Secondary | ICD-10-CM

## 2021-04-01 ENCOUNTER — Other Ambulatory Visit: Payer: Self-pay

## 2021-04-01 ENCOUNTER — Inpatient Hospital Stay (HOSPITAL_BASED_OUTPATIENT_CLINIC_OR_DEPARTMENT_OTHER): Payer: Medicare HMO | Admitting: Hematology

## 2021-04-01 ENCOUNTER — Inpatient Hospital Stay: Payer: Medicare HMO | Attending: Hematology

## 2021-04-01 ENCOUNTER — Encounter: Payer: Self-pay | Admitting: Hematology

## 2021-04-01 VITALS — BP 130/58 | HR 76 | Temp 98.1°F | Resp 18 | Ht 64.0 in | Wt 177.7 lb

## 2021-04-01 DIAGNOSIS — Z85038 Personal history of other malignant neoplasm of large intestine: Secondary | ICD-10-CM | POA: Diagnosis not present

## 2021-04-01 DIAGNOSIS — C50211 Malignant neoplasm of upper-inner quadrant of right female breast: Secondary | ICD-10-CM

## 2021-04-01 DIAGNOSIS — I10 Essential (primary) hypertension: Secondary | ICD-10-CM

## 2021-04-01 DIAGNOSIS — C186 Malignant neoplasm of descending colon: Secondary | ICD-10-CM | POA: Diagnosis not present

## 2021-04-01 DIAGNOSIS — Z923 Personal history of irradiation: Secondary | ICD-10-CM | POA: Insufficient documentation

## 2021-04-01 DIAGNOSIS — Z9221 Personal history of antineoplastic chemotherapy: Secondary | ICD-10-CM | POA: Diagnosis not present

## 2021-04-01 DIAGNOSIS — Z85118 Personal history of other malignant neoplasm of bronchus and lung: Secondary | ICD-10-CM | POA: Diagnosis not present

## 2021-04-01 DIAGNOSIS — Z170 Estrogen receptor positive status [ER+]: Secondary | ICD-10-CM

## 2021-04-01 DIAGNOSIS — Z85841 Personal history of malignant neoplasm of brain: Secondary | ICD-10-CM | POA: Diagnosis not present

## 2021-04-01 LAB — CBC WITH DIFFERENTIAL/PLATELET
Abs Immature Granulocytes: 0.04 10*3/uL (ref 0.00–0.07)
Basophils Absolute: 0 10*3/uL (ref 0.0–0.1)
Basophils Relative: 1 %
Eosinophils Absolute: 0.1 10*3/uL (ref 0.0–0.5)
Eosinophils Relative: 2 %
HCT: 45 % (ref 36.0–46.0)
Hemoglobin: 14.7 g/dL (ref 12.0–15.0)
Immature Granulocytes: 1 %
Lymphocytes Relative: 20 %
Lymphs Abs: 1.5 10*3/uL (ref 0.7–4.0)
MCH: 27.1 pg (ref 26.0–34.0)
MCHC: 32.7 g/dL (ref 30.0–36.0)
MCV: 82.9 fL (ref 80.0–100.0)
Monocytes Absolute: 0.6 10*3/uL (ref 0.1–1.0)
Monocytes Relative: 8 %
Neutro Abs: 5.1 10*3/uL (ref 1.7–7.7)
Neutrophils Relative %: 68 %
Platelets: 189 10*3/uL (ref 150–400)
RBC: 5.43 MIL/uL — ABNORMAL HIGH (ref 3.87–5.11)
RDW: 15 % (ref 11.5–15.5)
WBC: 7.4 10*3/uL (ref 4.0–10.5)
nRBC: 0 % (ref 0.0–0.2)

## 2021-04-01 LAB — COMPREHENSIVE METABOLIC PANEL
ALT: 30 U/L (ref 0–44)
AST: 25 U/L (ref 15–41)
Albumin: 3.8 g/dL (ref 3.5–5.0)
Alkaline Phosphatase: 101 U/L (ref 38–126)
Anion gap: 9 (ref 5–15)
BUN: 17 mg/dL (ref 8–23)
CO2: 27 mmol/L (ref 22–32)
Calcium: 10 mg/dL (ref 8.9–10.3)
Chloride: 106 mmol/L (ref 98–111)
Creatinine, Ser: 0.98 mg/dL (ref 0.44–1.00)
GFR, Estimated: >60 mL/min (ref >60)
Glucose, Bld: 121 mg/dL — ABNORMAL HIGH (ref 70–99)
Potassium: 3.6 mmol/L (ref 3.5–5.1)
Sodium: 142 mmol/L (ref 135–145)
Total Bilirubin: 1.1 mg/dL (ref 0.3–1.2)
Total Protein: 7.3 g/dL (ref 6.5–8.1)

## 2021-04-01 LAB — CEA (IN HOUSE-CHCC): CEA (CHCC-In House): 2.24 ng/mL (ref 0.00–5.00)

## 2021-04-01 MED ORDER — LETROZOLE 2.5 MG PO TABS: 2.5000 mg | ORAL_TABLET | Freq: Every day | ORAL | 3 refills | Status: DC

## 2021-04-01 MED ORDER — GABAPENTIN 300 MG PO CAPS: 300.0000 mg | ORAL_CAPSULE | Freq: Every day | ORAL | 3 refills | Status: DC

## 2021-04-01 NOTE — Progress Notes (Signed)
Kayla [name]   [phone] Fax:([phone]   Clinic Follow up Note   Patient Care Team: Zhou-Talbert, Elwyn Lade, MD as PCP - General (Family Medicine) Mayer Masker, [place] (General Surgery) Kyung Rudd, MD as Consulting Physician (Radiation Oncology) Jackolyn Confer, MD as Consulting Physician (General Surgery) Truitt Merle, MD as Consulting Physician (Hematology) Sylvan Cheese, NP as Nurse Practitioner (Hematology and Oncology)  Date of Service:  04/01/2021  CHIEF COMPLAINT: f/u of right breast cancer, h/o colon cancer  CURRENT THERAPY:  Adjuvant letrozole 2.22m daily started on 10/14/15  ASSESSMENT & PLAN:  DYARITHZA MINKis a 70y.o. female with   1. Right breat invasive ductal carcinoma, pT2N0M0, stage IIA, ER+/PR+, HER2-, Oncotype RS 30 -She was diagnosed in 02/2015. She is s/p right breast lumpectomy, adjuvant chemo and adjuvant radiation.  -She started antiestrogen therapy with letrozole in 09/2015. Tolerating well with manageable hot flashes. Plan for 7-10 years. -Genetics testing was negative for pathogenetic mutations.  -most recent mammogram 03/25/21 was negative. -She is clinically doing well. Lab reviewed, her CBC and CMP are within normal limits. Her physical exam is unremarkable. There is no clinical concern for recurrence. -Continue surveillance. Next mammogram in 02/2022 -Continue Letrozole. She is tolerating well, with mild hot flashes and joint stiffness.  Due to high risk of disease, I recommend her a total of 7-10-year course. -F/u 6 months   2. 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 She is now over 11 years from the time of diagnosis -She has remained NED on surveillance scans. Last CT scan in 2016 and Brain MRI in 2017. I do not plan to scan unless she has concerning symptoms.     3. HTN, Weight gain  -Continue HTN  medications. Managed by her PCP  -She has been able to purposefully lose weight by increased exercise. I encouraged her to continue.   -Her weight has been stable lately   4. Bone health  -DEXA scan from 03/25/21 was normal (T-score -0.6), stable from prior in 2020.   5. Elevated Alk Phos and mild hyperbilirubinemia  -resolved now  -she has stopped alcohol drinking      Plan -Continue Letrozole  -Lab and F/u with NP Lacie in 6 months   No problem-specific Assessment & Plan notes found for this encounter.   SUMMARY OF ONCOLOGIC HISTORY: Oncology History Overview Note  [place] of upper[place]inner quadrant [place] breast [place]   Staging form: Breast, AJCC 7th Edition     Clinical: Stage IIA (T2, N0, M0) - Unsigned     Pathologic stage from 04/14/2015: Stage IIA (T2, N0, cM0) - Signed by YTruitt Merle MD on 05/05/2015 Cancer of left colon ([facility]   Staging form: Colon and Rectum, AJCC 7th Edition     Pathologic: T4a, N1a, M1 - Unsigned      Breast cancer of upper-inner quadrant of right female breast ([facility]  02/27/2015 Mammogram   Diagnostic mammogram showed a 2.5 cm irregular mass within the far posterior upper inner right breast, ultrasound confirmed a 1.9 x 1.0 x 0.8 cm mass at 1:30 o'clock 15 submitted from nipple. No axillary adenopathy   03/03/2015 Initial Biopsy   Right breast needle biopsy showed invasive ductal carcinoma, grade 1-2.   03/03/2015 Receptors her2   ER 100%+, PR 80%+, ki67 15%, HER2/neu negative   03/03/2015 Clinical Stage   Stage IIA:  T2 N0   03/18/2015 Procedure   VUS at POLD1 gene called c.327G>C. neg at APC, ATM, AXIN2, BARD1, BMPR1A, BRCA1, BRCA2, BRIP1, CDH1, CDK4, CDKN2A, CHEK2, EPCAM, FANCC, MLH1, MSH2, MSH6, MUTYH, NBN, PALB2, PMS2, POLD1, POLE, PTEN, RAD51C, RAD51D, SCG5/GREM1, SMAD4, STK11, TP53, VHL,XRCC2   04/14/2015 Surgery   Right breast lumpectomy and sentinel lymph node biopsy. Surgical margins were negative.   04/14/2015 Pathology  Results   right breast invasive ductal carcinoma, grade 2, 2.7 cm,(+) DCIS, margins were negative, 4 sentinel lymph nodes and one axillary lymph nodes were negative, (+)lymphovascular invasion.   04/14/2015 Pathologic Stage   Stage IIA: T2 N0   04/14/2015 Oncotype testing   RS 30, which predicts 10-year risk of distance recurrence with tamoxifen alone 20%, intermediate risk   05/21/2015 - 07/24/2015 Adjuvant Chemotherapy    Adjuvant chemotherapy with docetaxel 75 mg/m, and Cytoxan 600 mg/m X 4 cycles   08/17/2015 - 09/13/2015 Radiation Therapy   Adjuvant RT: Right breast treated to 42.5 Gy in 17 fractions, Right breast boost treated to 7.5 Gy in 3 fractions   10/14/2015 -  Anti-estrogen oral therapy   Letrozole 2.5 mg daily   11/13/2015 Survivorship   Survivorship visit completed    Colon Oncology History: Diagnosis of colon cancer dates back to 10/27/2008 when Kayla [name] presented with what turned out to be a bowel perforation through tumor. Stage at that time was T4 N1 M1 with pulmonary metastatic disease. The K-ras mutation was detected. She underwent surgical resection of her tumor with colostomy on 10/27/08. She was treated with 10 cycles of FOLFOX 12/10/08-06/02/09 followed by 5-FU, leucovorin, and Avastin 06/23/09-11/17/09. Colostomy reversal on 09/20/10. She was found to have brain metastasis in 03/2011. PET on 03/25/11 showed resolution of pulmonary nodules. She underwent brain tumor resection on 03/31/11 by Dr. Vertell Limber, followed by [facility] on 04/15/11, and then craniotomy on 11/10/11. Craniotomy path was negative.   INTERVAL HISTORY:  Kayla [name] is here for a follow up of breast cancer. She was last seen by me on 09/29/20. She presents to the clinic alone. She reports doing well overall. She notes some pain to her back, likely related to arthritis or muscle pain.   All other systems were reviewed with the patient and are negative.  MEDICAL HISTORY:  Past Medical History:  Diagnosis Date   Allergy     sulfa   Arthritis    Blood transfusion    Brain cancer ([place])    2.7cm l parietal brain metastasis   Colon cancer ([place])    colon/ 2010/surg/chemo   Diverticulosis 07/22/2010   sigmoid colon   Family history of breast cancer    Family history of colon cancer    GERD (gastroesophageal reflux disease)    Headache(784.0)    Heart murmur    History of radiation therapy 04/15/11   17 Gy single fraction  l parietal brain metastais   Hx of adenomatous colonic polyps 01/2019   Hypertension    LVH (left ventricular hypertrophy)    mod/severe. echo 1/11. EF 65-70%    Neuromuscular disorder ([facility])    peripheral neuropathy feet   Pericardial effusion    echo 08/13/09   Peripheral vascular disease ([place])    Personal history of chemotherapy 2016   Right Breast Cancer   Personal history of radiation therapy 2016   Right Breast Cancer   S/P radiation therapy 08/18/15-09/14/15   right breast   Shortness of breath    Thrombocytopenia ([place])     SURGICAL  HISTORY: Past Surgical History:  Procedure Laterality Date   BREAST LUMPECTOMY Right 04/14/2015   BREAST LUMPECTOMY WITH RADIOACTIVE SEED AND SENTINEL LYMPH NODE BIOPSY Right 04/14/2015   Procedure: RIGHT BREAST LUMPECTOMY WITH RADIOACTIVE SEED ANDAXILLARY SENTINEL LYMPH NODE BIOPSY;  Surgeon: Jackolyn Confer, MD;  Location: [facility];  Service: General;  Laterality: Right;   COLON SURGERY  2010   COLONOSCOPY  07/22/2010,11-20-13   COLOSTOMY CLOSURE     CRANIOTOMY  03/31/11   left parietal mass resection   CRANIOTOMY  11/10/2011   Procedure: CRANIOTOMY TUMOR EXCISION;  Surgeon: Erline Levine, MD;  Location: [place] [facility];  Service: Neurosurgery;  Laterality: N/A;  Craniotomy for Biopsy of Tumor   PORTACATH PLACEMENT     10   ROTATOR CUFF REPAIR     rt   TONSILLECTOMY     TONSILLECTOMY     TUBAL LIGATION      I have reviewed the social history and family history with the patient and they are unchanged from previous  note.  ALLERGIES:  is allergic to elemental sulfur and sulfonamide derivatives.  MEDICATIONS:  Current Outpatient Medications  Medication Sig Dispense Refill   acetaminophen (TYLENOL) 500 MG tablet Take 1,000 mg by mouth every 6 (six) hours as needed for mild pain, moderate pain or headache. Reported on 09/30/2015     aspirin 81 MG chewable tablet aspirin 81 mg chewable tablet  Chew 1 tablet every day by oral route.     fluticasone (FLONASE) 50 MCG/ACT nasal spray Place 1 spray into both nostrils daily as needed for allergies. Reported on 11/12/2015     gabapentin (NEURONTIN) 300 MG capsule Take 1 capsule (300 mg total) by mouth at bedtime. 90 capsule 3   letrozole (FEMARA) 2.5 MG tablet Take 1 tablet (2.5 mg total) by mouth daily. 90 tablet 3   lisinopril (PRINIVIL,ZESTRIL) 40 MG tablet Take 1 tablet (40 mg total) by mouth daily. 90 tablet 0   pravastatin (PRAVACHOL) 20 MG tablet Take 20 mg by mouth daily.      VITAMIN D, ERGOCALCIFEROL, PO Take 2 drops every other day by mouth. 2000 units     No current facility-administered medications for this visit.    PHYSICAL EXAMINATION: ECOG PERFORMANCE STATUS: 0 - Asymptomatic  Vitals:   04/01/21 1152  BP: (!) 130/58  Pulse: 76  Resp: 18  Temp: 98.1 F (36.7 C)  SpO2: 97%   Wt Readings from Last 3 Encounters:  04/01/21 177 lb 11.2 oz (80.6 kg)  09/29/20 182 lb 4.8 oz (82.7 kg)  04/01/20 180 lb 14.4 oz (82.1 kg)     GENERAL:alert, no distress and comfortable SKIN: skin color, texture, turgor are normal, no rashes or significant lesions EYES: normal, Conjunctiva are pink and non-injected, sclera clear  NECK: supple, thyroid normal size, non-tender, without nodularity LYMPH:  no palpable lymphadenopathy in the cervical, axillary  LUNGS: clear to auscultation and percussion with normal breathing effort HEART: regular rate & rhythm and no murmurs and no lower extremity edema ABDOMEN:abdomen soft, non-tender and normal bowel  sounds Musculoskeletal:no cyanosis of digits and no clubbing  NEURO: alert & oriented x 3 with fluent speech, no focal motor/sensory deficits BREAST: No palpable mass, nodules or adenopathy bilaterally. Breast exam benign.   LABORATORY DATA:  I have reviewed the data as listed CBC Latest Ref Rng & Units 04/01/2021 09/29/2020 04/01/2020  WBC 4.0 - 10.5 K/uL 7.4 8.7 7.9  Hemoglobin 12.0 - 15.0 g/dL 14.7 14.8 13.9  Hematocrit 36.0 -  46.0 % 45.0 45.2 42.9  Platelets 150 - 400 K/uL 189 248 216     CMP Latest Ref Rng & Units 04/01/2021 09/29/2020 04/01/2020  Glucose 70 - 99 mg/dL 121(H) 126(H) 110(H)  BUN 8 - 23 mg/dL 17 19 23   Creatinine 0.44 - 1.00 mg/dL 0.98 1.02(H) 1.00  Sodium 135 - 145 mmol/L 142 138 139  Potassium 3.5 - 5.1 mmol/L 3.6 3.9 4.0  Chloride 98 - 111 mmol/L 106 101 103  CO2 22 - 32 mmol/L 27 29 29   Calcium 8.9 - 10.3 mg/dL 10.0 9.9 10.0  Total Protein 6.5 - 8.1 g/dL 7.3 7.8 7.8  Total Bilirubin 0.3 - 1.2 mg/dL 1.1 0.9 1.3(H)  Alkaline Phos 38 - 126 U/L 101 106 128(H)  AST 15 - 41 U/L 25 26 26   ALT 0 - 44 U/L 30 36 31      RADIOGRAPHIC STUDIES: I have personally reviewed the radiological images as listed and agreed with the findings in the report. No results found.    Orders Placed This Encounter  Procedures   [facility] (IN HOUSE-[facility])   All questions were answered. The patient knows to call the clinic with any problems, questions or concerns. No barriers to learning was detected. The total time spent in the appointment was 30 minutes.     Truitt Merle, MD 04/01/2021   I, Wilburn Mylar, am acting as scribe for Truitt Merle, MD.   I have reviewed the above documentation for accuracy and completeness, and I agree with the above.

## 2021-04-09 ENCOUNTER — Other Ambulatory Visit: Payer: Self-pay | Admitting: Hematology

## 2021-04-09 DIAGNOSIS — C186 Malignant neoplasm of descending colon: Secondary | ICD-10-CM

## 2021-09-28 NOTE — Progress Notes (Addendum)
?[place]   ?Telephone:([phone] [phone]   ?Clinic Follow up Note  ? ?Patient Care Team: ?Zhou-Talbert, Elwyn Lade, MD as PCP - General (Family Medicine) ?Mayer Masker, [place] (General Surgery) ?Kyung Rudd, MD as Consulting Physician (Radiation Oncology) ?Jackolyn Confer, MD as Consulting Physician (General Surgery) ?Truitt Merle, MD as Consulting Physician (Hematology) ?Sylvan Cheese, NP as Nurse Practitioner (Hematology and Oncology) ?09/30/2021 ? ?CHIEF COMPLAINT: Follow-up right breast cancer and history of colon cancer ? ?SUMMARY OF ONCOLOGIC HISTORY: ?Oncology History Overview Note  ?Breast cancer of upper-inner quadrant of right female breast ([place]) ?  Staging form: Breast, AJCC 7th Edition ?    Clinical: Stage IIA (T2, N0, M0) - Unsigned ?    Pathologic stage from 04/14/2015: Stage IIA (T2, N0, cM0) - Signed by Truitt Merle, MD on 05/05/2015 ?Cancer of left colon ([place]) ?  Staging form: Colon and Rectum, AJCC 7th Edition ?    Pathologic: T4a, N1a, M1 - Unsigned ? ? ? ?  ?Breast cancer of upper-inner quadrant of right female breast ([place])  ?02/27/2015 Mammogram  ? Diagnostic mammogram showed a 2.5 cm irregular mass within the far posterior upper inner right breast, ultrasound confirmed a 1.9 x 1.0 x 0.8 cm mass at 1:30 o'clock 15 submitted from nipple. No axillary adenopathy ?  ?03/03/2015 Initial Biopsy  ? Right breast needle biopsy showed invasive ductal carcinoma, grade 1-2. ?  ?03/03/2015 Receptors her2  ? ER 100%+, PR 80%+, ki67 15%, HER2/neu negative ?  ?03/03/2015 Clinical Stage  ? Stage IIA: T2 N0 ?  ?03/18/2015 Procedure  ? VUS at POLD1 gene called c.327G>C. neg at APC, ATM, AXIN2, BARD1, BMPR1A, BRCA1, BRCA2, BRIP1, CDH1, CDK4, CDKN2A, CHEK2, EPCAM, FANCC, MLH1, MSH2, MSH6, MUTYH, NBN, PALB2, PMS2, POLD1, POLE, PTEN, RAD51C, RAD51D, SCG5/GREM1, SMAD4, STK11, TP53, VHL,XRCC2 ?  ?04/14/2015 Surgery  ? Right breast lumpectomy and sentinel lymph node biopsy. Surgical  margins were negative. ?  ?04/14/2015 Pathology Results  ? right breast invasive ductal carcinoma, grade 2, 2.7 cm,(+) DCIS, margins were negative, 4 sentinel lymph nodes and one axillary lymph nodes were negative, (+)lymphovascular invasion. ?  ?04/14/2015 Pathologic Stage  ? Stage IIA: T2 N0 ?  ?04/14/2015 Oncotype testing  ? RS 30, which predicts 10-year risk of distance recurrence with tamoxifen alone 20%, intermediate risk ?  ?05/21/2015 - 07/24/2015 Adjuvant Chemotherapy  ?  Adjuvant chemotherapy with docetaxel 75 mg/m?, and Cytoxan 600 mg/m? X 4 cycles ?  ?08/17/2015 - 09/13/2015 Radiation Therapy  ? Adjuvant RT: Right breast treated to 42.5 Gy in 17 fractions, Right breast boost treated to 7.5 Gy in 3 fractions ?  ?10/14/2015 -  Anti-estrogen oral therapy  ? Letrozole 2.5 mg daily ?  ?11/13/2015 Survivorship  ? Survivorship visit completed ?  ? ? ?CURRENT THERAPY: Adjuvant letrozole 2.5 mg daily starting 10/14/2015 ? ?INTERVAL HISTORY: Ms. Posa returns for follow-up as scheduled, last seen by Dr. Burr Medico 04/01/2021.  Last mammogram and DEXA (normal) were 03/25/2021.  Last colonoscopy 01/2019 by Dr. Carlean Purl with recall in 2023.  She continues letrozole, tolerating with joint stiffness mainly in R shoulder and hot flashes that fluctuate in severity.  She has stable intermittent right breast "muscle spasm" but denies new breast concerns such as lump/mass, nipple discharge or inversion, or skin change.  She has stable neuropathy mainly in the right foot.  For the past few months she has occasional numbness from the upper arm down to her hand, this occurs day and night, not sure  what triggers it.  It eventually resolves, does not occur more often then once every 1 to few weeks.  She denies any headache, vision change, weakness, imbalance, or other neurological changes.  Energy and appetite are at baseline.  Denies change in bowel habits, abdominal pain/bloating, rectal bleeding, or any other new specific complaints. ? ? ?MEDICAL  HISTORY:  ?Past Medical History:  ?Diagnosis Date  ? Allergy   ? sulfa  ? Arthritis   ? Blood transfusion   ? Brain cancer [facility])   ? 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 Colon cancer ([place])   ? colon/ 2010/surg/chemo  ? Diverticulosis 07/22/2010  ? sigmoid colon  ? Family history of breast cancer   ? Family history of colon cancer   ? GERD (gastroesophageal reflux disease)   ? Headache(784.0)   ? Heart murmur   ? History of radiation therapy 04/15/11  ? 17 Gy single fraction  l parietal brain metastais  ? Hx of adenomatous colonic polyps 01/2019  ? Hypertension   ? LVH (left ventricular hypertrophy)   ? mod/severe. echo 1/11. EF 65-70%   ? Neuromuscular disorder ([place])   ? peripheral neuropathy feet  ? Pericardial effusion   ? echo 08/13/09  ? Peripheral vascular disease ([place])   ? Personal history of chemotherapy 2016  ? Right Breast Cancer  ? Personal history of radiation therapy 2016  ? Right Breast Cancer  ? S/P radiation therapy 08/18/15-09/14/15  ? right breast  ? Shortness of breath   ? Thrombocytopenia ([place])   ? ? ?SURGICAL HISTORY: ?Past Surgical History:  ?Procedure Laterality Date  ? BREAST LUMPECTOMY Right 04/14/2015  ? BREAST LUMPECTOMY WITH RADIOACTIVE SEED AND SENTINEL LYMPH NODE BIOPSY Right 04/14/2015  ? Procedure: RIGHT BREAST LUMPECTOMY WITH RADIOACTIVE SEED ANDAXILLARY SENTINEL LYMPH NODE BIOPSY;  Surgeon: Jackolyn Confer, MD;  Location: [place];  Service: General;  Laterality: Right;  ? COLON SURGERY  2010  ? COLONOSCOPY  07/22/2010,11-20-13  ? COLOSTOMY CLOSURE    ? CRANIOTOMY  03/31/11  ? left parietal mass resection  ? CRANIOTOMY  11/10/2011  ? Procedure: CRANIOTOMY TUMOR EXCISION;  Surgeon: Erline Levine, MD;  Location: [place] [facility];  Service: Neurosurgery;  Laterality: N/A;  Craniotomy for Biopsy of Tumor  ? PORTACATH PLACEMENT    ? 10  ? ROTATOR CUFF REPAIR    ? rt  ? TONSILLECTOMY    ? TONSILLECTOMY    ? TUBAL LIGATION    ? ? ?I have reviewed the social history and family history  with the patient and they are unchanged from previous note. ? ?ALLERGIES:  is allergic to elemental sulfur and sulfonamide derivatives. ? ?MEDICATIONS:  ?Current Outpatient Medications  ?Medication Sig Dispense Refill  ? acetaminophen (TYLENOL) 500 MG tablet Take 1,000 mg by mouth every 6 (six) hours as needed for mild pain, moderate pain or headache. Reported on 09/30/2015    ? aspirin 81 MG chewable tablet aspirin 81 mg chewable tablet ? Chew 1 tablet every day by oral route.    ? fluticasone (FLONASE) 50 MCG/ACT nasal spray Place 1 spray into both nostrils daily as needed for allergies. Reported on 11/12/2015    ? gabapentin (NEURONTIN) 300 MG capsule Take 1 capsule (300 mg total) by mouth at bedtime. 90 capsule 1  ? letrozole (FEMARA) 2.5 MG tablet Take 1 tablet (2.5 mg total) by mouth daily. 90 tablet 3  ? lisinopril (PRINIVIL,ZESTRIL) 40 MG tablet Take 1 tablet (40 mg total) by mouth daily.  90 tablet 0  ? pravastatin (PRAVACHOL) 20 MG tablet Take 20 mg by mouth daily.     ? VITAMIN D, ERGOCALCIFEROL, PO Take 2 drops every other day by mouth. 2000 units    ? ?No current facility-administered medications for this visit.  ? ? ?PHYSICAL EXAMINATION: ?ECOG PERFORMANCE STATUS: 1 - Symptomatic but completely ambulatory ? ?Vitals:  ? 09/30/21 0956  ?BP: 125/67  ?Pulse: 77  ?Resp: 18  ?Temp: (!) 97 ?F (36.1 ?C)  ?SpO2: 96%  ? ?Filed Weights  ? 09/30/21 0956  ?Weight: 181 lb 2 oz (82.2 kg)  ? ? ?GENERAL:alert, no distress and comfortable ?SKIN: Without rash ?EYES:  sclera clear ?NECK: Without mass ?LYMPH:  no palpable cervical or supraclavicular lymphadenopathy  ?LUNGS:  normal breathing effort ?HEART: no lower extremity edema ?ABDOMEN:abdomen soft, non-tender and normal bowel sounds ?Musculoskeletal: Mild right scapular tenderness ?NEURO: alert & oriented x 3 with fluent speech, normal strength, coordination, and sensation bilaterally. no focal motor/sensory deficits ?Breast exam pendulous breasts are symmetrical without  nipple discharge or inversion.  S/p right lumpectomy, incision completely healed with mild scar tissue.  No palpable mass or nodularity in either breast or axilla that I could appreciate.  Moisture with

## 2021-09-30 ENCOUNTER — Inpatient Hospital Stay: Payer: Medicare Other | Attending: Nurse Practitioner

## 2021-09-30 ENCOUNTER — Other Ambulatory Visit: Payer: Self-pay

## 2021-09-30 ENCOUNTER — Inpatient Hospital Stay (HOSPITAL_BASED_OUTPATIENT_CLINIC_OR_DEPARTMENT_OTHER): Payer: Medicare Other | Admitting: Nurse Practitioner

## 2021-09-30 ENCOUNTER — Encounter: Payer: Self-pay | Admitting: Nurse Practitioner

## 2021-09-30 VITALS — BP 125/67 | HR 77 | Temp 97.0°F | Resp 18 | Wt 181.1 lb

## 2021-09-30 DIAGNOSIS — D751 Secondary polycythemia: Secondary | ICD-10-CM | POA: Insufficient documentation

## 2021-09-30 DIAGNOSIS — R202 Paresthesia of skin: Secondary | ICD-10-CM | POA: Diagnosis not present

## 2021-09-30 DIAGNOSIS — Z170 Estrogen receptor positive status [ER+]: Secondary | ICD-10-CM | POA: Insufficient documentation

## 2021-09-30 DIAGNOSIS — Z79811 Long term (current) use of aromatase inhibitors: Secondary | ICD-10-CM | POA: Insufficient documentation

## 2021-09-30 DIAGNOSIS — C50211 Malignant neoplasm of upper-inner quadrant of right female breast: Secondary | ICD-10-CM

## 2021-09-30 DIAGNOSIS — Z1231 Encounter for screening mammogram for malignant neoplasm of breast: Secondary | ICD-10-CM

## 2021-09-30 DIAGNOSIS — Z85038 Personal history of other malignant neoplasm of large intestine: Secondary | ICD-10-CM | POA: Diagnosis not present

## 2021-09-30 DIAGNOSIS — Z9221 Personal history of antineoplastic chemotherapy: Secondary | ICD-10-CM | POA: Insufficient documentation

## 2021-09-30 DIAGNOSIS — Z923 Personal history of irradiation: Secondary | ICD-10-CM | POA: Diagnosis not present

## 2021-09-30 DIAGNOSIS — C186 Malignant neoplasm of descending colon: Secondary | ICD-10-CM | POA: Diagnosis not present

## 2021-09-30 LAB — COMPREHENSIVE METABOLIC PANEL
ALT: 30 U/L (ref 0–44)
AST: 23 U/L (ref 15–41)
Albumin: 4.2 g/dL (ref 3.5–5.0)
Alkaline Phosphatase: 97 U/L (ref 38–126)
Anion gap: 5 (ref 5–15)
BUN: 16 mg/dL (ref 8–23)
CO2: 33 mmol/L — ABNORMAL HIGH (ref 22–32)
Calcium: 10 mg/dL (ref 8.9–10.3)
Chloride: 102 mmol/L (ref 98–111)
Creatinine, Ser: 1.08 mg/dL — ABNORMAL HIGH (ref 0.44–1.00)
GFR, Estimated: 56 mL/min — ABNORMAL LOW (ref >60)
Glucose, Bld: 102 mg/dL — ABNORMAL HIGH (ref 70–99)
Potassium: 3.7 mmol/L (ref 3.5–5.1)
Sodium: 140 mmol/L (ref 135–145)
Total Bilirubin: 1.2 mg/dL (ref 0.3–1.2)
Total Protein: 7.7 g/dL (ref 6.5–8.1)

## 2021-09-30 LAB — CBC WITH DIFFERENTIAL/PLATELET
Abs Immature Granulocytes: 0.04 10*3/uL (ref 0.00–0.07)
Basophils Absolute: 0.1 10*3/uL (ref 0.0–0.1)
Basophils Relative: 1 %
Eosinophils Absolute: 0.2 10*3/uL (ref 0.0–0.5)
Eosinophils Relative: 3 %
HCT: 46.9 % — ABNORMAL HIGH (ref 36.0–46.0)
Hemoglobin: 15.1 g/dL — ABNORMAL HIGH (ref 12.0–15.0)
Immature Granulocytes: 1 %
Lymphocytes Relative: 21 %
Lymphs Abs: 1.5 10*3/uL (ref 0.7–4.0)
MCH: 26.8 pg (ref 26.0–34.0)
MCHC: 32.2 g/dL (ref 30.0–36.0)
MCV: 83.2 fL (ref 80.0–100.0)
Monocytes Absolute: 0.7 10*3/uL (ref 0.1–1.0)
Monocytes Relative: 9 %
Neutro Abs: 4.7 10*3/uL (ref 1.7–7.7)
Neutrophils Relative %: 65 %
Platelets: 216 10*3/uL (ref 150–400)
RBC: 5.64 MIL/uL — ABNORMAL HIGH (ref 3.87–5.11)
RDW: 14.6 % (ref 11.5–15.5)
WBC: 7.1 10*3/uL (ref 4.0–10.5)
nRBC: 0 % (ref 0.0–0.2)

## 2021-09-30 MED ORDER — GABAPENTIN 300 MG PO CAPS: 300.0000 mg | ORAL_CAPSULE | Freq: Every day | ORAL | 1 refills | Status: DC

## 2021-10-01 ENCOUNTER — Telehealth: Payer: Self-pay | Admitting: Hematology

## 2021-10-01 NOTE — Telephone Encounter (Signed)
Scheduled follow-up appointment per 3/16 los. Patient is aware. 

## 2021-10-13 ENCOUNTER — Other Ambulatory Visit: Payer: Self-pay

## 2021-10-13 ENCOUNTER — Ambulatory Visit (HOSPITAL_COMMUNITY)
Admission: RE | Admit: 2021-10-13 | Discharge: 2021-10-13 | Disposition: A | Discharge status: Home / Self Care | Payer: Medicare Other | Source: Ambulatory Visit | Attending: Nurse Practitioner | Admitting: Nurse Practitioner

## 2021-10-13 DIAGNOSIS — R202 Paresthesia of skin: Secondary | ICD-10-CM | POA: Insufficient documentation

## 2021-10-13 MED ORDER — GADOBUTROL 1 MMOL/ML IV SOLN
8.0000 mL | Freq: Once | INTRAVENOUS | Status: AC | PRN
  Administered 2021-10-13: 8 mL via INTRAVENOUS

## 2021-10-15 ENCOUNTER — Telehealth: Payer: Self-pay | Admitting: Internal Medicine

## 2021-10-15 ENCOUNTER — Other Ambulatory Visit: Payer: Self-pay | Admitting: Radiation Therapy

## 2021-10-15 NOTE — Telephone Encounter (Signed)
Scheduled appt per 3/30 staff msg from [place]. Pt is aware of appt date and time. Pt is aware to arrive 15 mins prior to appt time and to bring and updated insurance card. Pt is aware of appt location.   ?

## 2021-10-18 ENCOUNTER — Telehealth: Payer: Self-pay

## 2021-10-18 ENCOUNTER — Inpatient Hospital Stay: Payer: Medicare Other | Attending: Internal Medicine

## 2021-10-18 DIAGNOSIS — Z923 Personal history of irradiation: Secondary | ICD-10-CM | POA: Insufficient documentation

## 2021-10-18 DIAGNOSIS — Z170 Estrogen receptor positive status [ER+]: Secondary | ICD-10-CM | POA: Insufficient documentation

## 2021-10-18 DIAGNOSIS — Z87891 Personal history of nicotine dependence: Secondary | ICD-10-CM | POA: Insufficient documentation

## 2021-10-18 DIAGNOSIS — Z85038 Personal history of other malignant neoplasm of large intestine: Secondary | ICD-10-CM | POA: Insufficient documentation

## 2021-10-18 DIAGNOSIS — Z79899 Other long term (current) drug therapy: Secondary | ICD-10-CM | POA: Insufficient documentation

## 2021-10-18 DIAGNOSIS — Z7982 Long term (current) use of aspirin: Secondary | ICD-10-CM | POA: Insufficient documentation

## 2021-10-18 DIAGNOSIS — C7931 Secondary malignant neoplasm of brain: Secondary | ICD-10-CM | POA: Insufficient documentation

## 2021-10-18 DIAGNOSIS — Z9221 Personal history of antineoplastic chemotherapy: Secondary | ICD-10-CM | POA: Insufficient documentation

## 2021-10-18 DIAGNOSIS — Z803 Family history of malignant neoplasm of breast: Secondary | ICD-10-CM | POA: Insufficient documentation

## 2021-10-18 DIAGNOSIS — Z800 Family history of malignant neoplasm of digestive organs: Secondary | ICD-10-CM | POA: Insufficient documentation

## 2021-10-18 DIAGNOSIS — C50211 Malignant neoplasm of upper-inner quadrant of right female breast: Secondary | ICD-10-CM | POA: Insufficient documentation

## 2021-10-18 DIAGNOSIS — Z79811 Long term (current) use of aromatase inhibitors: Secondary | ICD-10-CM | POA: Insufficient documentation

## 2021-10-18 NOTE — Telephone Encounter (Signed)
-----   Message from Alla Feeling, NP sent at 10/15/2021  3:31 PM EDT ----- ?Please let her know we are going to review her MRI a bit closer in multidisciplinary conference and will be in touch with her early next week. She is already aware an appointment with Dr Mickeal Skinner has been arranged.  ? ?Thanks, ?Lacie ?

## 2021-10-18 NOTE — Telephone Encounter (Signed)
This nurse spoke with patient and made aware that her MRI results will be reviewed and she will be contacted.  This nurse reminded patient of appointment with Dr. Mickeal Skinner on 4/13.  Patient acknowledged understanding.  No further questions or concerns at this time.   ?

## 2021-10-28 ENCOUNTER — Other Ambulatory Visit: Payer: Self-pay

## 2021-10-28 ENCOUNTER — Inpatient Hospital Stay (HOSPITAL_BASED_OUTPATIENT_CLINIC_OR_DEPARTMENT_OTHER): Payer: Medicare Other | Admitting: Internal Medicine

## 2021-10-28 DIAGNOSIS — Z803 Family history of malignant neoplasm of breast: Secondary | ICD-10-CM | POA: Diagnosis not present

## 2021-10-28 DIAGNOSIS — C50211 Malignant neoplasm of upper-inner quadrant of right female breast: Secondary | ICD-10-CM | POA: Diagnosis present

## 2021-10-28 DIAGNOSIS — Z85038 Personal history of other malignant neoplasm of large intestine: Secondary | ICD-10-CM | POA: Diagnosis not present

## 2021-10-28 DIAGNOSIS — Z7982 Long term (current) use of aspirin: Secondary | ICD-10-CM | POA: Diagnosis not present

## 2021-10-28 DIAGNOSIS — Z170 Estrogen receptor positive status [ER+]: Secondary | ICD-10-CM | POA: Diagnosis not present

## 2021-10-28 DIAGNOSIS — C7931 Secondary malignant neoplasm of brain: Secondary | ICD-10-CM | POA: Diagnosis present

## 2021-10-28 DIAGNOSIS — Z923 Personal history of irradiation: Secondary | ICD-10-CM | POA: Diagnosis not present

## 2021-10-28 DIAGNOSIS — Z9221 Personal history of antineoplastic chemotherapy: Secondary | ICD-10-CM | POA: Diagnosis not present

## 2021-10-28 DIAGNOSIS — Z87891 Personal history of nicotine dependence: Secondary | ICD-10-CM | POA: Diagnosis not present

## 2021-10-28 DIAGNOSIS — Z79899 Other long term (current) drug therapy: Secondary | ICD-10-CM | POA: Diagnosis not present

## 2021-10-28 DIAGNOSIS — Z79811 Long term (current) use of aromatase inhibitors: Secondary | ICD-10-CM | POA: Diagnosis not present

## 2021-10-28 DIAGNOSIS — Z800 Family history of malignant neoplasm of digestive organs: Secondary | ICD-10-CM | POA: Diagnosis not present

## 2021-10-28 DIAGNOSIS — G5622 Lesion of ulnar nerve, left upper limb: Secondary | ICD-10-CM | POA: Diagnosis not present

## 2021-10-28 NOTE — Progress Notes (Signed)
? ?[facility] [address]  ?[place], [place] [address] ?[phone] ? ? ?New Patient Evaluation ? ?Date of Service: 10/28/21 ?Patient Name: Kayla [name] ?Patient MRN: [id number] ?Patient DOB: 1950-11-14 ?Provider: Ventura Sellers, MD ? ?Identifying Statement:  ?Kayla [name] is a 71 y.o. female with Ulnar neuropathy at elbow of left upper extremity who presents for initial consultation and evaluation regarding cancer associated neurologic deficits.   ? ?Referring Provider: ?Alfonse Flavors, MD ?[address] ?[place],  [place] [address] ? ?Primary Cancer: ? ?Oncologic History: ?Oncology History Overview Note  ?Breast cancer of upper-inner quadrant of right female breast ([place]) ?  Staging form: Breast, AJCC 7th Edition ?    Clinical: Stage IIA (T2, N0, M0) - Unsigned ?    Pathologic stage from 04/14/2015: Stage IIA (T2, N0, cM0) - Signed by Truitt Merle, MD on 05/05/2015 ?Cancer of left colon ([place]) ?  Staging form: Colon and Rectum, AJCC 7th Edition ?    Pathologic: T4a, N1a, M1 - Unsigned ? ? ? ?  ?Breast cancer of upper-inner quadrant of right female breast ([place])  ?02/27/2015 Mammogram  ? Diagnostic mammogram showed a 2.5 cm irregular mass within the far posterior upper inner right breast, ultrasound confirmed a 1.9 x 1.0 x 0.8 cm mass at 1:30 o'clock 15 submitted from nipple. No axillary adenopathy ?  ?03/03/2015 Initial Biopsy  ? Right breast needle biopsy showed invasive ductal carcinoma, grade 1-2. ?  ?03/03/2015 Receptors her2  ? ER 100%+, PR 80%+, ki67 15%, HER2/neu negative ?  ?03/03/2015 Clinical Stage  ? Stage IIA: T2 N0 ?  ?03/18/2015 Procedure  ? VUS at POLD1 gene called c.327G>C. neg at APC, ATM, AXIN2, BARD1, BMPR1A, BRCA1, BRCA2, BRIP1, CDH1, CDK4, CDKN2A, CHEK2, EPCAM, FANCC, MLH1, MSH2, MSH6, MUTYH, NBN, PALB2, PMS2, POLD1, POLE, PTEN, RAD51C, RAD51D, SCG5/GREM1, SMAD4, STK11, TP53, VHL,XRCC2 ?  ?04/14/2015 Surgery  ? Right breast lumpectomy and sentinel  lymph node biopsy. Surgical margins were negative. ?  ?04/14/2015 Pathology Results  ? right breast invasive ductal carcinoma, grade 2, 2.7 cm,(+) DCIS, margins were negative, 4 sentinel lymph nodes and one axillary lymph nodes were negative, (+)lymphovascular invasion. ?  ?04/14/2015 Pathologic Stage  ? Stage IIA: T2 N0 ?  ?04/14/2015 Oncotype testing  ? RS 30, which predicts 10-year risk of distance recurrence with tamoxifen alone 20%, intermediate risk ?  ?05/21/2015 - 07/24/2015 Adjuvant Chemotherapy  ?  Adjuvant chemotherapy with docetaxel 75 mg/m?, and Cytoxan 600 mg/m? X 4 cycles ?  ?08/17/2015 - 09/13/2015 Radiation Therapy  ? Adjuvant RT: Right breast treated to 42.5 Gy in 17 fractions, Right breast boost treated to 7.5 Gy in 3 fractions ?  ?10/14/2015 -  Anti-estrogen oral therapy  ? Letrozole 2.5 mg daily ?  ?11/13/2015 Survivorship  ? Survivorship visit completed ?  ? ? ?History of Present Illness: ?The patient's records from the referring physician were obtained and reviewed and the patient interviewed to confirm this HPI.  [name] presents today to review neuropathic complaint.  She describes numbness and occasional tingling of the inside aspect of her left arm.  Symptoms begin at the elbow and move down to her fingers.  She can resolve the numbness by positioning the arm in particular way.  Onset was several months ago, frequency is near daily.  Denies weakness, frank pain.  No issues with gait, no headaches or seizures.  Had prior brain surgery and radiation for colon cancer metastasis in  2012 [facility], [facility]).   ? ?Medications: ?Current Outpatient Medications on File Prior to Visit  ?Medication Sig Dispense Refill  ? acetaminophen (TYLENOL) 500 MG tablet Take 1,000 mg by mouth every 6 (six) hours as needed for mild pain, moderate pain or headache. Reported on 09/30/2015    ? aspirin 81 MG chewable tablet aspirin 81 mg chewable tablet ? Chew 1 tablet every day by oral route.    ? fluticasone (FLONASE)  50 MCG/ACT nasal spray Place 1 spray into both nostrils daily as needed for allergies. Reported on 11/12/2015    ? gabapentin (NEURONTIN) 300 MG capsule Take 1 capsule (300 mg total) by mouth at bedtime. 90 capsule 1  ? letrozole (FEMARA) 2.5 MG tablet Take 1 tablet (2.5 mg total) by mouth daily. 90 tablet 3  ? lisinopril (PRINIVIL,ZESTRIL) 40 MG tablet Take 1 tablet (40 mg total) by mouth daily. 90 tablet 0  ? loratadine (CLARITIN) 10 MG tablet Take 10 mg by mouth daily.    ? pravastatin (PRAVACHOL) 20 MG tablet Take 20 mg by mouth daily.     ? VITAMIN D, ERGOCALCIFEROL, PO Take 2 drops every other day by mouth. 2000 units    ? ?No current facility-administered medications on file prior to visit.  ? ? ?Allergies:  ?Allergies  ?Allergen Reactions  ? Elemental Sulfur Rash  ? Sulfonamide Derivatives Rash  ? ?Past Medical History:  ?Past Medical History:  ?Diagnosis Date  ? Allergy   ? sulfa  ? Arthritis   ? Blood transfusion   ? Brain cancer [facility])   ? 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 Colon cancer ([place])   ? colon/ 2010/surg/chemo  ? Diverticulosis 07/22/2010  ? sigmoid colon  ? Family history of breast cancer   ? Family history of colon cancer   ? GERD (gastroesophageal reflux disease)   ? Headache(784.0)   ? Heart murmur   ? History of radiation therapy 04/15/11  ? 17 Gy single fraction  l parietal brain metastais  ? Hx of adenomatous colonic polyps 01/2019  ? Hypertension   ? LVH (left ventricular hypertrophy)   ? mod/severe. echo 1/11. EF 65-70%   ? Neuromuscular disorder ([place])   ? peripheral neuropathy feet  ? Pericardial effusion   ? echo 08/13/09  ? Peripheral vascular disease ([facility])   ? Personal history of chemotherapy 2016  ? Right Breast Cancer  ? Personal history of radiation therapy 2016  ? Right Breast Cancer  ? S/P radiation therapy 08/18/15-09/14/15  ? right breast  ? Shortness of breath   ? Thrombocytopenia ([place])   ? ?Past Surgical History:  ?Past Surgical History:  ?Procedure Laterality Date  ? BREAST  LUMPECTOMY Right 04/14/2015  ? BREAST LUMPECTOMY WITH RADIOACTIVE SEED AND SENTINEL LYMPH NODE BIOPSY Right 04/14/2015  ? Procedure: RIGHT BREAST LUMPECTOMY WITH RADIOACTIVE SEED ANDAXILLARY SENTINEL LYMPH NODE BIOPSY;  Surgeon: Jackolyn Confer, MD;  Location: [facility];  Service: General;  Laterality: Right;  ? COLON SURGERY  2010  ? COLONOSCOPY  07/22/2010,11-20-13  ? COLOSTOMY CLOSURE    ? CRANIOTOMY  03/31/11  ? left parietal mass resection  ? CRANIOTOMY  11/10/2011  ? Procedure: CRANIOTOMY TUMOR EXCISION;  Surgeon: Erline Levine, MD;  Location: [facility];  Service: Neurosurgery;  Laterality: N/A;  Craniotomy for Biopsy of Tumor  ? PORTACATH PLACEMENT    ? 10  ? ROTATOR CUFF REPAIR    ? rt  ? TONSILLECTOMY    ? TONSILLECTOMY    ?  TUBAL LIGATION    ? ?Social History:  ?Social History  ? ?Socioeconomic History  ? Marital status: Divorced  ?  Spouse name: Not on file  ? Number of children: Not on file  ? Years of education: Not on file  ? Highest education level: Not on file  ?Occupational History  ? Not on file  ?Tobacco Use  ? Smoking status: Former  ?  Packs/day: 0.25  ?  Years: 37.00  ?  Pack years: 9.25  ?  Types: Cigarettes  ?  Quit date: 11/03/2008  ?  Years since quitting: 12.9  ? Smokeless tobacco: Never  ? Tobacco comments:  ?  30 pack year hx   ?Vaping Use  ? Vaping Use: Never used  ?Substance and Sexual Activity  ? Alcohol use: Yes  ?  Alcohol/week: 1.0 standard drink  ?  Types: 1 Cans of beer per week  ? Drug use: No  ? Sexual activity: Yes  ?  Birth control/protection: Post-menopausal  ?Other Topics Concern  ? Not on file  ?Social History Narrative  ? Disability  ? Single, 2 adult sons  ? ?Social Determinants of Health  ? ?Financial Resource Strain: Not on file  ?Food Insecurity: Not on file  ?Transportation Needs: Not on file  ?Physical Activity: Not on file  ?Stress: Not on file  ?Social Connections: Not on file  ?Intimate Partner Violence: Not on file  ? ?Family History:  ?Family  History  ?Problem Relation Age of Onset  ? Breast cancer Mother 37  ? Diabetes Father   ? Coronary artery disease Father   ? Gout Brother   ? Colon polyps Brother   ? Breast cancer Cousin   ?     dx in her 36s  ?

## 2022-03-28 ENCOUNTER — Ambulatory Visit: Payer: Medicare Other

## 2022-03-29 ENCOUNTER — Other Ambulatory Visit: Payer: Self-pay | Admitting: Nurse Practitioner

## 2022-03-29 DIAGNOSIS — C186 Malignant neoplasm of descending colon: Secondary | ICD-10-CM

## 2022-04-04 ENCOUNTER — Encounter: Payer: Self-pay | Admitting: Internal Medicine

## 2022-04-07 ENCOUNTER — Ambulatory Visit: Payer: Medicare Other

## 2022-04-07 ENCOUNTER — Other Ambulatory Visit: Payer: Self-pay

## 2022-04-07 DIAGNOSIS — C50211 Malignant neoplasm of upper-inner quadrant of right female breast: Secondary | ICD-10-CM

## 2022-04-07 DIAGNOSIS — C186 Malignant neoplasm of descending colon: Secondary | ICD-10-CM

## 2022-04-08 ENCOUNTER — Inpatient Hospital Stay: Payer: Medicare Other | Attending: Hematology | Admitting: Hematology

## 2022-04-08 ENCOUNTER — Other Ambulatory Visit: Payer: Self-pay

## 2022-04-08 ENCOUNTER — Inpatient Hospital Stay: Payer: Medicare Other

## 2022-04-08 ENCOUNTER — Ambulatory Visit
Admission: RE | Admit: 2022-04-08 | Discharge: 2022-04-08 | Disposition: A | Discharge status: Home / Self Care | Payer: Medicare Other | Source: Ambulatory Visit | Attending: Nurse Practitioner | Admitting: Nurse Practitioner

## 2022-04-08 ENCOUNTER — Encounter: Payer: Self-pay | Admitting: Hematology

## 2022-04-08 VITALS — BP 125/79 | HR 86 | Temp 98.5°F | Resp 18 | Ht 64.0 in | Wt 178.3 lb

## 2022-04-08 DIAGNOSIS — Z170 Estrogen receptor positive status [ER+]: Secondary | ICD-10-CM | POA: Diagnosis not present

## 2022-04-08 DIAGNOSIS — Z79811 Long term (current) use of aromatase inhibitors: Secondary | ICD-10-CM | POA: Diagnosis not present

## 2022-04-08 DIAGNOSIS — Z1231 Encounter for screening mammogram for malignant neoplasm of breast: Secondary | ICD-10-CM

## 2022-04-08 DIAGNOSIS — C186 Malignant neoplasm of descending colon: Secondary | ICD-10-CM | POA: Diagnosis not present

## 2022-04-08 DIAGNOSIS — C50211 Malignant neoplasm of upper-inner quadrant of right female breast: Secondary | ICD-10-CM

## 2022-04-08 DIAGNOSIS — Z85038 Personal history of other malignant neoplasm of large intestine: Secondary | ICD-10-CM | POA: Diagnosis not present

## 2022-04-08 DIAGNOSIS — Z923 Personal history of irradiation: Secondary | ICD-10-CM | POA: Insufficient documentation

## 2022-04-08 DIAGNOSIS — Z7982 Long term (current) use of aspirin: Secondary | ICD-10-CM | POA: Insufficient documentation

## 2022-04-08 DIAGNOSIS — Z79899 Other long term (current) drug therapy: Secondary | ICD-10-CM | POA: Diagnosis not present

## 2022-04-08 LAB — CMP (CANCER CENTER ONLY)
ALT: 48 U/L — ABNORMAL HIGH (ref 0–44)
AST: 42 U/L — ABNORMAL HIGH (ref 15–41)
Albumin: 4.3 g/dL (ref 3.5–5.0)
Alkaline Phosphatase: 83 U/L (ref 38–126)
Anion gap: 3 — ABNORMAL LOW (ref 5–15)
BUN: 12 mg/dL (ref 8–23)
CO2: 33 mmol/L — ABNORMAL HIGH (ref 22–32)
Calcium: 9.7 mg/dL (ref 8.9–10.3)
Chloride: 104 mmol/L (ref 98–111)
Creatinine: 1.05 mg/dL — ABNORMAL HIGH (ref 0.44–1.00)
GFR, Estimated: 58 mL/min — ABNORMAL LOW (ref >60)
Glucose, Bld: 96 mg/dL (ref 70–99)
Potassium: 4.1 mmol/L (ref 3.5–5.1)
Sodium: 140 mmol/L (ref 135–145)
Total Bilirubin: 1 mg/dL (ref 0.3–1.2)
Total Protein: 7.5 g/dL (ref 6.5–8.1)

## 2022-04-08 LAB — CBC WITH DIFFERENTIAL (CANCER CENTER ONLY)
Abs Immature Granulocytes: 0.05 10*3/uL (ref 0.00–0.07)
Basophils Absolute: 0.1 10*3/uL (ref 0.0–0.1)
Basophils Relative: 1 %
Eosinophils Absolute: 0.2 10*3/uL (ref 0.0–0.5)
Eosinophils Relative: 2 %
HCT: 47.2 % — ABNORMAL HIGH (ref 36.0–46.0)
Hemoglobin: 15.8 g/dL — ABNORMAL HIGH (ref 12.0–15.0)
Immature Granulocytes: 1 %
Lymphocytes Relative: 19 %
Lymphs Abs: 1.8 10*3/uL (ref 0.7–4.0)
MCH: 27.8 pg (ref 26.0–34.0)
MCHC: 33.5 g/dL (ref 30.0–36.0)
MCV: 83 fL (ref 80.0–100.0)
Monocytes Absolute: 0.8 10*3/uL (ref 0.1–1.0)
Monocytes Relative: 9 %
Neutro Abs: 6.4 10*3/uL (ref 1.7–7.7)
Neutrophils Relative %: 68 %
Platelet Count: 206 10*3/uL (ref 150–400)
RBC: 5.69 MIL/uL — ABNORMAL HIGH (ref 3.87–5.11)
RDW: 15.4 % (ref 11.5–15.5)
WBC Count: 9.3 10*3/uL (ref 4.0–10.5)
nRBC: 0 % (ref 0.0–0.2)

## 2022-04-08 MED ORDER — GABAPENTIN 300 MG PO CAPS: 300.0000 mg | ORAL_CAPSULE | Freq: Every day | ORAL | 1 refills | Status: DC

## 2022-04-08 MED ORDER — LETROZOLE 2.5 MG PO TABS: 2.5000 mg | ORAL_TABLET | Freq: Every day | ORAL | 3 refills | Status: DC

## 2022-04-08 NOTE — Progress Notes (Signed)
[facility]   Telephone:(336) [phone] Fax:(336) [phone]   Clinic Follow up Note   Patient Care Team: Zhou-Talbert, Elwyn Lade, MD as PCP - General (Family Medicine) Mayer Masker, [place] (General Surgery) Kyung Rudd, MD as Consulting Physician (Radiation Oncology) Jackolyn Confer, MD as Consulting Physician (General Surgery) Truitt Merle, MD as Consulting Physician (Hematology) Sylvan Cheese, NP as Nurse Practitioner (Hematology and Oncology)  Date of Service:  04/08/2022  CHIEF COMPLAINT: f/u of right breast cancer, h/o colon cancer  CURRENT THERAPY:  Adjuvant letrozole 2.34m daily started on 10/14/15  ASSESSMENT & PLAN:  Kayla ALLESis a 71y.o. post-menopausal female with   1. Right breat invasive ductal carcinoma, pT2N0M0, stage IIA, ER+/PR+, HER2-, Oncotype RS 30 -diagnosed in 02/2015. S/p right breast lumpectomy, adjuvant chemo and adjuvant radiation.  -She started antiestrogen therapy with letrozole in 09/2015. Tolerating well with manageable hot flashes and some joint stiffness. Plan for 7 years. She will complete in March 2024  -most recent mammogram was performed earlier today, 04/08/22. Results are pending -She is clinically doing well. Lab reviewed, her CBC and CMP are within normal limits. Her physical exam is unremarkable. There is no clinical concern for recurrence. -F/u in one year    2. 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   3. Bone health  -DEXA scan from 03/25/21 was normal (T-score -0.6), stable from prior in 2020.     Plan -Continue Letrozole until the end of March 2024  -Lab and F/u with NP Lacie in one year    No problem-specific Assessment & Plan notes found for this encounter.   SUMMARY OF ONCOLOGIC HISTORY: Oncology History Overview Note  Breast cancer of upper-inner quadrant of right female breast ([facility]   Staging  form: Breast, AJCC 7th Edition     Clinical: Stage IIA (T2, N0, M0) - Unsigned     Pathologic stage from 04/14/2015: Stage IIA (T2, N0, cM0) - Signed by YTruitt Merle MD on 05/05/2015 Cancer of left colon ([facility]   Staging form: Colon and Rectum, AJCC 7th Edition     Pathologic: T4a, N1a, M1 - Unsigned      Breast cancer of upper-inner quadrant of right female breast ([facility]  02/27/2015 Mammogram   Diagnostic mammogram showed a 2.5 cm irregular mass within the far posterior upper inner right breast, ultrasound confirmed a 1.9 x 1.0 x 0.8 cm mass at 1:30 o'clock 15 submitted from nipple. No axillary adenopathy   03/03/2015 Initial Biopsy   Right breast needle biopsy showed invasive ductal carcinoma, grade 1-2.   03/03/2015 Receptors her2   ER 100%+, PR 80%+, ki67 15%, HER2/neu negative   03/03/2015 Clinical Stage   Stage IIA: T2 N0   03/18/2015 Procedure   VUS at POLD1 gene called c.327G>C. neg at APC, ATM, AXIN2, BARD1, BMPR1A, BRCA1, BRCA2, BRIP1, CDH1, CDK4, CDKN2A, CHEK2, EPCAM, FANCC, MLH1, MSH2, MSH6, MUTYH, NBN, PALB2, PMS2, POLD1, POLE, PTEN, RAD51C, RAD51D, SCG5/GREM1, SMAD4, STK11, TP53, VHL,XRCC2   04/14/2015 Surgery   Right breast lumpectomy and sentinel lymph node biopsy. Surgical margins were negative.   04/14/2015 Pathology Results   right breast invasive ductal carcinoma, grade 2, 2.7 cm,(+) DCIS, margins were negative, 4 sentinel lymph nodes and one axillary lymph nodes were negative, (+)lymphovascular invasion.   04/14/2015 Pathologic Stage   Stage IIA: T2 N0   04/14/2015 Oncotype testing   RS 30,  which predicts 10-year risk of distance recurrence with tamoxifen alone 20%, intermediate risk   05/21/2015 - 07/24/2015 Adjuvant Chemotherapy    Adjuvant chemotherapy with docetaxel 75 mg/m, and Cytoxan 600 mg/m X 4 cycles   08/17/2015 - 09/13/2015 Radiation Therapy   Adjuvant RT: Right breast treated to 42.5 Gy in 17 fractions, Right breast boost treated to 7.5 Gy in 3 fractions    10/14/2015 -  Anti-estrogen oral therapy   Letrozole 2.5 mg daily   11/13/2015 Survivorship   Survivorship visit completed      INTERVAL HISTORY:  Kayla [name] is here for a follow up of breast cancer. She was last seen by NP Lacie on 09/30/21. She presents to the clinic alone.  She is clinically doing well, denies any significant pain, or other new symptoms.  She has good appetite, and energy level.  She is functioning well.  Her hair has not grown much since her last chemotherapy.  She is tolerating letrozole well.   All other systems were reviewed with the patient and are negative.  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 echo 1/11. EF 65-70%    Neuromuscular disorder ([facility])    peripheral neuropathy feet   Pericardial effusion    echo 08/13/09   Peripheral vascular disease ([place])    Personal history of chemotherapy 2016   Right Breast Cancer   Personal history of radiation therapy 2016   Right Breast Cancer   S/P radiation therapy 08/18/15-09/14/15   right breast   Shortness of breath    Thrombocytopenia ([place])     SURGICAL HISTORY: Past Surgical History:  Procedure Laterality Date   BREAST LUMPECTOMY Right 04/14/2015   BREAST LUMPECTOMY WITH RADIOACTIVE SEED AND SENTINEL LYMPH NODE BIOPSY Right 04/14/2015   Procedure: RIGHT BREAST LUMPECTOMY WITH RADIOACTIVE SEED ANDAXILLARY SENTINEL LYMPH NODE BIOPSY;   Surgeon: Jackolyn Confer, MD;  Location: [facility];  Service: General;  Laterality: Right;   COLON SURGERY  2010   COLONOSCOPY  07/22/2010,11-20-13   COLOSTOMY CLOSURE     CRANIOTOMY  03/31/11   left parietal mass resection   CRANIOTOMY  11/10/2011   Procedure: CRANIOTOMY TUMOR EXCISION;  Surgeon: Erline Levine, MD;  Location: [facility];  Service: Neurosurgery;  Laterality: N/A;  Craniotomy for Biopsy of Tumor   PORTACATH PLACEMENT     10   ROTATOR CUFF REPAIR     rt   TONSILLECTOMY     TONSILLECTOMY     TUBAL LIGATION      I have reviewed the social history and family history with the patient and they are unchanged from previous note.  ALLERGIES:  is allergic to elemental sulfur and sulfonamide derivatives.  MEDICATIONS:  Current Outpatient Medications  Medication Sig Dispense Refill   acetaminophen (TYLENOL) 500 MG tablet  Take 1,000 mg by mouth every 6 (six) hours as needed for mild pain, moderate pain or headache. Reported on 09/30/2015     aspirin 81 MG chewable tablet aspirin 81 mg chewable tablet  Chew 1 tablet every day by oral route.     fluticasone (FLONASE) 50 MCG/ACT nasal spray Place 1 spray into both nostrils daily as needed for allergies. Reported on 11/12/2015     gabapentin (NEURONTIN) 300 MG capsule Take 1 capsule (300 mg total) by mouth at bedtime. 90 capsule 1   letrozole (FEMARA) 2.5 MG tablet Take 1 tablet (2.5 mg total) by mouth daily. 90 tablet 3   lisinopril (PRINIVIL,ZESTRIL) 40 MG tablet Take 1 tablet (40 mg total) by mouth daily. 90 tablet 0   loratadine (CLARITIN) 10 MG tablet Take 10 mg by mouth daily.     pravastatin (PRAVACHOL) 20 MG tablet Take 20 mg by mouth daily.      VITAMIN D, ERGOCALCIFEROL, PO Take 2 drops every other day by mouth. 2000 units     No current facility-administered medications for this visit.    PHYSICAL EXAMINATION: ECOG PERFORMANCE STATUS: 0 - Asymptomatic  Vitals:   04/08/22 1534  BP: 125/79  Pulse: 86   Resp: 18  Temp: 98.5 F (36.9 C)  SpO2: 97%   Wt Readings from Last 3 Encounters:  04/08/22 178 lb 4.8 oz (80.9 kg)  10/28/21 181 lb 4.8 oz (82.2 kg)  09/30/21 181 lb 2 oz (82.2 kg)     GENERAL:alert, no distress and comfortable SKIN: skin color, texture, turgor are normal, no rashes or significant lesions EYES: normal, Conjunctiva are pink and non-injected, sclera clear NECK: supple, thyroid normal size, non-tender, without nodularity LYMPH:  no palpable lymphadenopathy in the cervical, axillary LUNGS: clear to auscultation and percussion with normal breathing effort HEART: regular rate & rhythm and no murmurs and no lower extremity edema ABDOMEN:abdomen soft, non-tender and normal bowel sounds Musculoskeletal:no cyanosis of digits and no clubbing  NEURO: alert & oriented x 3 with fluent speech, no focal motor/sensory deficits BREAST: No palpable mass, nodules or adenopathy bilaterally. Breast exam benign.   LABORATORY DATA:  I have reviewed the data as listed    Latest Ref Rng & Units 04/08/2022    3:10 PM 09/30/2021    9:33 AM 04/01/2021   11:05 AM  CBC  WBC 4.0 - 10.5 K/uL 9.3  7.1  7.4   Hemoglobin 12.0 - 15.0 g/dL 15.8  15.1  14.7   Hematocrit 36.0 - 46.0 % 47.2  46.9  45.0   Platelets 150 - 400 K/uL 206  216  189         Latest Ref Rng & Units 04/08/2022    3:10 PM 09/30/2021    9:33 AM 04/01/2021   11:05 AM  CMP  Glucose 70 - 99 mg/dL 96  102  121   BUN 8 - 23 mg/dL _0 Creatinine 0.44 - 1.00 mg/dL 1.05  1.08  0.98   Sodium 135 - 145 mmol/L 140  140  142   Potassium 3.5 - 5.1 mmol/L 4.1  3.7  3.6   Chloride 98 - 111 mmol/L 104  102  106   CO2 22 - 32 mmol/L 33  33  27   Calcium 8.9 - 10.3 mg/dL 9.7  10.0  10.0   Total Protein 6.5 - 8.1 g/dL 7.5  7.7  7.3   Total Bilirubin 0.3 - 1.2 mg/dL 1.0  1.2  1.1   Alkaline Phos 38 - 126 U/L 83  97  101   AST 15 - 41 U/L 42  23  25   ALT 0 - 44 U/L 48  30  30       RADIOGRAPHIC STUDIES: I have personally  reviewed the radiological images as listed and agreed with the findings in the report. No results found.    No orders of the defined types were placed in this encounter.  All questions were answered. The patient knows to call the clinic with any problems, questions or concerns. No barriers to learning was detected. The total time spent in the appointment was 30 minutes.     Truitt Merle, MD 04/08/2022   I, Wilburn Mylar, am acting as scribe for Truitt Merle, MD.   I have reviewed the above documentation for accuracy and completeness, and I agree with the above.

## 2022-04-11 ENCOUNTER — Encounter: Payer: Self-pay | Admitting: Internal Medicine

## 2022-05-03 ENCOUNTER — Ambulatory Visit (AMBULATORY_SURGERY_CENTER): Payer: Self-pay

## 2022-05-03 VITALS — Ht 64.0 in | Wt 175.0 lb

## 2022-05-03 DIAGNOSIS — Z86010 Personal history of colonic polyps: Secondary | ICD-10-CM

## 2022-05-03 DIAGNOSIS — Z85038 Personal history of other malignant neoplasm of large intestine: Secondary | ICD-10-CM

## 2022-05-03 NOTE — Progress Notes (Signed)
No egg or soy allergy known to patient  No issues known to pt with past sedation with any surgeries or procedures Patient denies ever being told they had issues or difficulty with intubation  No FH of Malignant Hyperthermia Pt is not on diet pills Pt is not on  home 02  Pt is not on blood thinners  Pt denies issues with constipation  No A fib or A flutter Have any cardiac testing pending--denied Pt instructed to use Singlecare.[email or web address] or GoodRx for a price reduction on prep   

## 2022-05-24 ENCOUNTER — Encounter: Payer: Self-pay | Admitting: Internal Medicine

## 2022-06-02 ENCOUNTER — Encounter: Payer: Self-pay | Admitting: Internal Medicine

## 2022-06-02 ENCOUNTER — Ambulatory Visit (AMBULATORY_SURGERY_CENTER): Payer: Medicare Other | Admitting: Internal Medicine

## 2022-06-02 VITALS — BP 108/63 | HR 75 | Temp 97.5°F | Resp 10 | Ht 64.0 in | Wt 175.0 lb

## 2022-06-02 DIAGNOSIS — Z85038 Personal history of other malignant neoplasm of large intestine: Secondary | ICD-10-CM | POA: Diagnosis not present

## 2022-06-02 DIAGNOSIS — K635 Polyp of colon: Secondary | ICD-10-CM

## 2022-06-02 DIAGNOSIS — Z08 Encounter for follow-up examination after completed treatment for malignant neoplasm: Secondary | ICD-10-CM | POA: Diagnosis not present

## 2022-06-02 DIAGNOSIS — Z86010 Personal history of colonic polyps: Secondary | ICD-10-CM | POA: Diagnosis not present

## 2022-06-02 DIAGNOSIS — Z09 Encounter for follow-up examination after completed treatment for conditions other than malignant neoplasm: Secondary | ICD-10-CM | POA: Diagnosis not present

## 2022-06-02 DIAGNOSIS — D125 Benign neoplasm of sigmoid colon: Secondary | ICD-10-CM

## 2022-06-02 MED ORDER — SODIUM CHLORIDE 0.9 % IV SOLN: 500.0000 mL | Freq: Once | INTRAVENOUS | Status: DC

## 2022-06-02 NOTE — Progress Notes (Signed)
Called to room to assist during endoscopic procedure.  Patient ID and intended procedure confirmed with present staff. Received instructions for my participation in the procedure from the performing physician.  

## 2022-06-02 NOTE — Op Note (Signed)
[facility] Patient Name: Kayla [name] Procedure Date: 06/02/2022 11:19 AM MRN: [id number] Endoscopist: Gatha Mayer , MD, [id number] Age: 71 Referring MD:  Date of Birth: 1950/09/05 Gender: Female Account #: [id number] Procedure:                Colonoscopy Indications:              High risk colon cancer surveillance: Personal                            history of colonic polyps, High risk colon cancer                            surveillance: Personal history of colon cancer,                            Last colonoscopy: 2020 Medicines:                Monitored Anesthesia Care Procedure:                Pre-Anesthesia Assessment:                           - Prior to the procedure, a History and Physical                            was performed, and patient medications and                            allergies were reviewed. The patient's tolerance of                            previous anesthesia was also reviewed. The risks                            and benefits of the procedure and the sedation                            options and risks were discussed with the patient.                            All questions were answered, and informed consent                            was obtained. Prior Anticoagulants: The patient has                            taken no anticoagulant or antiplatelet agents. ASA                            Grade Assessment: III - A patient with severe                            systemic disease. After reviewing the risks and  benefits, the patient was deemed in satisfactory                            condition to undergo the procedure.                           After obtaining informed consent, the colonoscope                            was passed under direct vision. Throughout the                            procedure, the patient's blood pressure, pulse, and                            oxygen saturations were monitored  continuously. The                            Olympus CF-HQ190L ([id number]) Colonoscope was                            introduced through the anus and advanced to the the                            cecum, identified by appendiceal orifice and                            ileocecal valve. The colonoscopy was performed                            without difficulty. The patient tolerated the                            procedure well. The quality of the bowel                            preparation was excellent. The ileocecal valve,                            appendiceal orifice, and rectum were photographed. Scope In: 11:33:00 AM Scope Out: 11:40:29 AM Scope Withdrawal Time: 0 hours 5 minutes 55 seconds  Total Procedure Duration: 0 hours 7 minutes 29 seconds  Findings:                 The perianal and digital rectal examinations were                            normal.                           A 1 to 2 mm polyp was found in the sigmoid colon.                            The polyp was sessile. The polyp was removed with a  cold snare. Resection and retrieval were complete.                            Verification of patient identification for the                            specimen was done. Estimated blood loss was minimal.                           There was evidence of a prior end-to-end                            colo-colonic anastomosis in the sigmoid colon. This                            was characterized by healthy appearing mucosa.                           The exam was otherwise without abnormality on                            direct and retroflexion views. Complications:            No immediate complications. Estimated Blood Loss:     Estimated blood loss was minimal. Impression:               - One 1 to 2 mm polyp in the sigmoid colon, removed                            with a cold snare. Resected and retrieved.                           - End-to-end  colo-colonic anastomosis,                            characterized by healthy appearing mucosa.                           - The examination was otherwise normal on direct                            and retroflexion views. Recommendation:           - Patient has a contact number available for                            emergencies. The signs and symptoms of potential                            delayed complications were discussed with the                            patient. Return to normal activities tomorrow.  Written discharge instructions were provided to the                            patient.                           - Resume previous diet.                           - Continue present medications.                           - Await pathology results.                           - Repeat colonoscopy is recommended for                            surveillance. The colonoscopy date will be                            determined after pathology results from today's                            exam become available for review. Gatha Mayer, MD 06/02/2022 11:47:33 AM This report has been signed electronically.

## 2022-06-02 NOTE — Patient Instructions (Addendum)
Handout provided about polyps.  Resume previous diet.  Continue present medications.  Await pathology results.  Repeat colonoscopy recommendation based on pathology from today's exam.  YOU HAD AN ENDOSCOPIC PROCEDURE TODAY AT [facility]:   Refer to the procedure report that was given to you for any specific questions about what was found during the examination.  If the procedure report does not answer your questions, please call your gastroenterologist to clarify.  If you requested that your care partner not be given the details of your procedure findings, then the procedure report has been included in a sealed envelope for you to review at your convenience later.  YOU SHOULD EXPECT: Some feelings of bloating in the abdomen. Passage of more gas than usual.  Walking can help get rid of the air that was put into your GI tract during the procedure and reduce the bloating. If you had a lower endoscopy (such as a colonoscopy or flexible sigmoidoscopy) you may notice spotting of blood in your stool or on the toilet paper. If you underwent a bowel prep for your procedure, you may not have a normal bowel movement for a few days.  Please Note:  You might notice some irritation and congestion in your nose or some drainage.  This is from the oxygen used during your procedure.  There is no need for concern and it should clear up in a day or so.  SYMPTOMS TO REPORT IMMEDIATELY:  Following lower endoscopy (colonoscopy or flexible sigmoidoscopy):  Excessive amounts of blood in the stool  Significant tenderness or worsening of abdominal pains  Swelling of the abdomen that is new, acute  Fever of 100F or higher    For urgent or emergent issues, a gastroenterologist can be reached at any hour by calling [phone]. Do not use MyChart messaging for urgent concerns.    DIET:  We do recommend a small meal at first, but then you may proceed to your regular diet.  Drink plenty of fluids but you  should avoid alcoholic beverages for 24 hours.  ACTIVITY:  You should plan to take it easy for the rest of today and you should NOT DRIVE or use heavy machinery until tomorrow (because of the sedation medicines used during the test).    FOLLOW UP: Our staff will call the number listed on your records the next business day following your procedure.  We will call around 7:15- 8:00 am to check on you and address any questions or concerns that you may have regarding the information given to you following your procedure. If we do not reach you, we will leave a message.     If any biopsies were taken you will be contacted by phone or by letter within the next 1-3 weeks.  Please call us at [phone] if you have not heard about the biopsies in 3 weeks.    SIGNATURES/CONFIDENTIALITY: You and/or your care partner have signed paperwork which will be entered into your electronic medical record.  These signatures attest to the fact that that the information above on your After Visit Summary has been reviewed and is understood.  Full responsibility of the confidentiality of this discharge information lies with you and/or your care-partner.I removed one tiny polyp. I will let you know pathology results and when to have another routine colonoscopy by mail and/or My Chart. I think you will be able to wait 5 years.  All else ok.  Please restart your medications and normal foods.  I appreciate the opportunity to care for you. Gatha Mayer, MD, [facility]

## 2022-06-02 NOTE — Progress Notes (Signed)
Pt's states no medical or surgical changes since previsit or office visit. 

## 2022-06-02 NOTE — Progress Notes (Signed)
Sedate, gd SR, tolerated procedure well, VSS, report to RN 

## 2022-06-02 NOTE — Progress Notes (Signed)
[facility] History and Physical   Primary Care Physician:  Zhou-Talbert, Elwyn Lade, MD   Reason for Procedure:   Hx colon cancer  Plan:    colonoscopy     HPI: Kayla YERKES is a 71 y.o. 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 echo 1/11. EF 65-70%    Neuromuscular disorder (HCC)    peripheral neuropathy feet   Pericardial effusion    echo 08/13/09   Peripheral vascular disease ([place])    Personal history of chemotherapy 2016   Right Breast Cancer   Personal history of radiation therapy 2016   Right Breast Cancer   S/P radiation therapy 08/18/15-09/14/15   right breast   Shortness of breath    Thrombocytopenia ([place])     Past Surgical History:  Procedure Laterality Date   BREAST LUMPECTOMY Right 04/14/2015   BREAST LUMPECTOMY WITH RADIOACTIVE SEED AND SENTINEL LYMPH NODE BIOPSY Right 04/14/2015   Procedure: RIGHT BREAST LUMPECTOMY WITH RADIOACTIVE SEED ANDAXILLARY SENTINEL LYMPH NODE BIOPSY;  Surgeon: Jackolyn Confer, MD;  Location: [facility];  Service: General;  Laterality: Right;   COLON SURGERY  2010   COLONOSCOPY  07/22/2010,11-20-13   COLOSTOMY CLOSURE     CRANIOTOMY  03/31/11   left parietal mass resection   CRANIOTOMY  11/10/2011   Procedure: CRANIOTOMY TUMOR EXCISION;  Surgeon: Erline Levine, MD;  Location: [place] [facility];  Service: Neurosurgery;   Laterality: N/A;  Craniotomy for Biopsy of Tumor   PORTACATH PLACEMENT     10   ROTATOR CUFF REPAIR     rt   TONSILLECTOMY     TONSILLECTOMY     TUBAL LIGATION      Prior to Admission medications   Medication Sig Start Date End Date Taking? Authorizing Provider  aspirin 81 MG chewable tablet aspirin 81 mg chewable tablet  Chew 1 tablet every day by oral route.   Yes [provider]  gabapentin (NEURONTIN) 300 MG capsule Take 1 capsule (300 mg total) by mouth at bedtime. 04/08/22  Yes Truitt Merle, MD  hydrochlorothiazide (HYDRODIURIL) 12.5 MG tablet Take 12.5 mg by mouth daily. 02/17/22  Yes [provider]  letrozole (FEMARA) 2.5 MG tablet Take 1 tablet (2.5 mg total) by mouth daily. 04/08/22  Yes Truitt Merle, MD  lisinopril (PRINIVIL,ZESTRIL) 40 MG tablet Take 1 tablet (40 mg total) by mouth daily. 02/17/14  Yes Chism, David, MD  pravastatin (PRAVACHOL) 20 MG tablet Take 20 mg by mouth daily.  03/24/16  Yes [provider]  acetaminophen (TYLENOL) 500 MG tablet Take 1,000 mg by mouth every 6 (six) hours as needed for mild pain, moderate pain or headache. Reported on 09/30/2015 Patient not taking: Reported on 05/03/2022    [provider]  fluticasone (  FLONASE) 50 MCG/ACT nasal spray Place 1 spray into both nostrils daily as needed for allergies. Reported on 11/12/2015 10/09/14   [provider]  loratadine (CLARITIN) 10 MG tablet Take 10 mg by mouth daily. 10/06/21   [provider]  Pyridoxine HCl (VITAMIN B-6 PO) Take by mouth.    [provider]  VITAMIN D, ERGOCALCIFEROL, PO Take 2 drops every other day by mouth. 2000 units    [provider]    Current Outpatient Medications  Medication Sig Dispense Refill   aspirin 81 MG chewable tablet aspirin 81 mg chewable tablet  Chew 1 tablet every day by oral route.     gabapentin (NEURONTIN) 300 MG capsule Take 1 capsule (300 mg total) by mouth at bedtime. 90 capsule 1    hydrochlorothiazide (HYDRODIURIL) 12.5 MG tablet Take 12.5 mg by mouth daily.     letrozole (FEMARA) 2.5 MG tablet Take 1 tablet (2.5 mg total) by mouth daily. 90 tablet 3   lisinopril (PRINIVIL,ZESTRIL) 40 MG tablet Take 1 tablet (40 mg total) by mouth daily. 90 tablet 0   pravastatin (PRAVACHOL) 20 MG tablet Take 20 mg by mouth daily.      acetaminophen (TYLENOL) 500 MG tablet Take 1,000 mg by mouth every 6 (six) hours as needed for mild pain, moderate pain or headache. Reported on 09/30/2015 (Patient not taking: Reported on 05/03/2022)     fluticasone (FLONASE) 50 MCG/ACT nasal spray Place 1 spray into both nostrils daily as needed for allergies. Reported on 11/12/2015     loratadine (CLARITIN) 10 MG tablet Take 10 mg by mouth daily.     Pyridoxine HCl (VITAMIN B-6 PO) Take by mouth.     VITAMIN D, ERGOCALCIFEROL, PO Take 2 drops every other day by mouth. 2000 units     Current Facility-Administered Medications  Medication Dose Route Frequency Provider Last Rate Last Admin   0.9 %  sodium chloride infusion  500 mL Intravenous Once Gatha Mayer, MD        Allergies as of 06/02/2022 - Review Complete 06/02/2022  Allergen Reaction Noted   Elemental sulfur Rash 03/06/2018   Sulfonamide derivatives Rash     Family History  Problem Relation Age of Onset   Breast cancer Mother 27   Diabetes Father    Coronary artery disease Father    Gout Brother    Colon polyps Brother    Breast cancer Cousin        dx in her 14s   Colon cancer Cousin        mother's maternal first cousin   Diabetes Maternal Aunt    Cirrhosis Brother    Esophageal cancer Neg Hx    Rectal cancer Neg Hx    Stomach cancer Neg Hx     Social History   Socioeconomic History   Marital status: Divorced    Spouse name: Not on file   Number of children: Not on file   Years of education: Not on file   Highest education level: Not on file  Occupational History   Not on file  Tobacco Use   Smoking status: Former     Packs/day: 0.25    Years: 37.00    Total pack years: 9.25    Types: Cigarettes    Quit date: 11/03/2008    Years since quitting: 13.5   Smokeless tobacco: Never   Tobacco comments:    30 pack year hx   Vaping Use   Vaping Use: Never used  Substance and Sexual Activity   Alcohol use: Not Currently    Alcohol/week: 1.0 standard drink of alcohol    Types: 1 Cans of beer per week   Drug use: No   Sexual activity: Yes    Birth control/protection: Post-menopausal  Other Topics Concern   Not on file  Social History Narrative   Disability   Single, 2 adult sons   Social Determinants of Health   Financial Resource Strain: Not on file  Food Insecurity: Not on file  Transportation Needs: Not on file  Physical Activity: Not on file  Stress: Not on file  Social Connections: Not on file  Intimate Partner Violence: Not on file    Review of Systems:  All other review of systems negative except as mentioned in the HPI.  Physical Exam: Vital signs BP 114/76   Pulse 89   Temp (!) 97.5 F (36.4 C) (Temporal)   Ht 5\' 4"  (1.626 m)   Wt 175 lb (79.4 kg)   SpO2 97%   BMI 30.04 kg/m   General:   Alert,  Well-developed, well-nourished, pleasant and cooperative in NAD Lungs:  Clear throughout to auscultation.   Heart:  Regular rate and rhythm; no murmurs, clicks, rubs,  or gallops. Abdomen:  Soft, nontender and nondistended. Normal bowel sounds.   Neuro/Psych:  Alert and cooperative. Normal mood and affect. A and O x 3   @Caitland Porchia  Simonne Maffucci, MD, [facility] [phone] (pager) 06/02/2022 11:23 AM@

## 2022-06-03 ENCOUNTER — Telehealth: Payer: Self-pay

## 2022-06-03 NOTE — Telephone Encounter (Signed)
  Follow up Call-     06/02/2022   10:29 AM  Call back number  Post procedure Call Back phone  # [phone]  Permission to leave phone message Yes     Patient questions:  Do you have a fever, pain , or abdominal swelling? No. Pain Score  0 *  Have you tolerated food without any problems? Yes.    Have you been able to return to your normal activities? Yes.    Do you have any questions about your discharge instructions: Diet   No. Medications  No. Follow up visit  No.  Do you have questions or concerns about your Care? No.  Actions: * If pain score is 4 or above: No action needed, pain <4.

## 2022-06-08 ENCOUNTER — Encounter: Payer: Self-pay | Admitting: Internal Medicine

## 2023-01-10 ENCOUNTER — Other Ambulatory Visit: Payer: Self-pay | Admitting: Hematology

## 2023-01-10 DIAGNOSIS — C186 Malignant neoplasm of descending colon: Secondary | ICD-10-CM

## 2023-03-09 ENCOUNTER — Other Ambulatory Visit: Payer: Self-pay | Admitting: Nurse Practitioner

## 2023-03-09 DIAGNOSIS — Z1231 Encounter for screening mammogram for malignant neoplasm of breast: Secondary | ICD-10-CM

## 2023-03-24 ENCOUNTER — Ambulatory Visit
Admission: RE | Admit: 2023-03-24 | Discharge: 2023-03-24 | Disposition: A | Discharge status: Home / Self Care | Payer: Medicare Other | Source: Ambulatory Visit | Attending: Nurse Practitioner | Admitting: Nurse Practitioner

## 2023-03-24 DIAGNOSIS — Z1231 Encounter for screening mammogram for malignant neoplasm of breast: Secondary | ICD-10-CM

## 2023-04-09 NOTE — Assessment & Plan Note (Signed)
pT2N0M0, stage IIA, ER+/PR+, HER2-, Oncotype RS 30 -diagnosed in 02/2015. S/p right breast lumpectomy, adjuvant chemo and adjuvant radiation.  -She started antiestrogen therapy with letrozole in 09/2015. Tolerating well with manageable hot flashes and some joint stiffness. Plan for 7 years. She completed in March 2024  -Unclear surveillance now, we will continue annual mammogram.

## 2023-04-10 ENCOUNTER — Other Ambulatory Visit: Payer: Self-pay

## 2023-04-10 ENCOUNTER — Inpatient Hospital Stay (HOSPITAL_BASED_OUTPATIENT_CLINIC_OR_DEPARTMENT_OTHER): Payer: Medicare Other | Admitting: Hematology

## 2023-04-10 ENCOUNTER — Inpatient Hospital Stay: Payer: Medicare Other | Attending: Hematology

## 2023-04-10 VITALS — BP 122/82 | HR 92 | Temp 98.4°F | Resp 18 | Ht 64.0 in | Wt 173.1 lb

## 2023-04-10 DIAGNOSIS — Z170 Estrogen receptor positive status [ER+]: Secondary | ICD-10-CM

## 2023-04-10 DIAGNOSIS — Z800 Family history of malignant neoplasm of digestive organs: Secondary | ICD-10-CM | POA: Insufficient documentation

## 2023-04-10 DIAGNOSIS — Z803 Family history of malignant neoplasm of breast: Secondary | ICD-10-CM | POA: Insufficient documentation

## 2023-04-10 DIAGNOSIS — C186 Malignant neoplasm of descending colon: Secondary | ICD-10-CM

## 2023-04-10 DIAGNOSIS — Z85038 Personal history of other malignant neoplasm of large intestine: Secondary | ICD-10-CM | POA: Diagnosis not present

## 2023-04-10 DIAGNOSIS — G629 Polyneuropathy, unspecified: Secondary | ICD-10-CM | POA: Insufficient documentation

## 2023-04-10 DIAGNOSIS — C50211 Malignant neoplasm of upper-inner quadrant of right female breast: Secondary | ICD-10-CM

## 2023-04-10 DIAGNOSIS — Z853 Personal history of malignant neoplasm of breast: Secondary | ICD-10-CM | POA: Insufficient documentation

## 2023-04-10 LAB — CMP (CANCER CENTER ONLY)
ALT: 30 U/L (ref 0–44)
AST: 24 U/L (ref 15–41)
Albumin: 4.2 g/dL (ref 3.5–5.0)
Alkaline Phosphatase: 91 U/L (ref 38–126)
Anion gap: 6 (ref 5–15)
BUN: 15 mg/dL (ref 8–23)
CO2: 32 mmol/L (ref 22–32)
Calcium: 10.1 mg/dL (ref 8.9–10.3)
Chloride: 100 mmol/L (ref 98–111)
Creatinine: 1.13 mg/dL — ABNORMAL HIGH (ref 0.44–1.00)
GFR, Estimated: 52 mL/min — ABNORMAL LOW (ref >60)
Glucose, Bld: 158 mg/dL — ABNORMAL HIGH (ref 70–99)
Potassium: 3.6 mmol/L (ref 3.5–5.1)
Sodium: 138 mmol/L (ref 135–145)
Total Bilirubin: 1.3 mg/dL — ABNORMAL HIGH (ref 0.3–1.2)
Total Protein: 7.4 g/dL (ref 6.5–8.1)

## 2023-04-10 LAB — CBC WITH DIFFERENTIAL (CANCER CENTER ONLY)
Abs Immature Granulocytes: 0.04 10*3/uL (ref 0.00–0.07)
Basophils Absolute: 0.1 10*3/uL (ref 0.0–0.1)
Basophils Relative: 1 %
Eosinophils Absolute: 0.1 10*3/uL (ref 0.0–0.5)
Eosinophils Relative: 1 %
HCT: 48.7 % — ABNORMAL HIGH (ref 36.0–46.0)
Hemoglobin: 16.4 g/dL — ABNORMAL HIGH (ref 12.0–15.0)
Immature Granulocytes: 1 %
Lymphocytes Relative: 16 %
Lymphs Abs: 1.3 10*3/uL (ref 0.7–4.0)
MCH: 28.2 pg (ref 26.0–34.0)
MCHC: 33.7 g/dL (ref 30.0–36.0)
MCV: 83.8 fL (ref 80.0–100.0)
Monocytes Absolute: 0.5 10*3/uL (ref 0.1–1.0)
Monocytes Relative: 6 %
Neutro Abs: 6.1 10*3/uL (ref 1.7–7.7)
Neutrophils Relative %: 75 %
Platelet Count: 199 10*3/uL (ref 150–400)
RBC: 5.81 MIL/uL — ABNORMAL HIGH (ref 3.87–5.11)
RDW: 14.6 % (ref 11.5–15.5)
WBC Count: 8.1 10*3/uL (ref 4.0–10.5)
nRBC: 0 % (ref 0.0–0.2)

## 2023-04-10 MED ORDER — GABAPENTIN 300 MG PO CAPS: 300.0000 mg | ORAL_CAPSULE | Freq: Every day | ORAL | 3 refills | Status: DC

## 2023-04-10 NOTE — Progress Notes (Signed)
[facility]   [phone] Fax:([phone]   Clinic Follow up Note   Patient Care Team: Zhou-Talbert, Ralene Bathe, MD as PCP - General (Family Medicine) Celedonio Miyamoto, PA (General Surgery) Dorothy Puffer, MD as Consulting Physician (Radiation Oncology) Avel Peace, MD as Consulting Physician (General Surgery) Malachy Mood, MD as Consulting Physician (Hematology) Salomon Fick, NP as Nurse Practitioner (Hematology and Oncology)  Date of Service:  04/10/2023  CHIEF COMPLAINT: f/u of breast cancer  CURRENT THERAPY:  Cancer surveillance  Oncology History   Breast cancer of upper-inner quadrant of right female breast (HCC)  pT2N0M0, stage IIA, ER+/PR+, HER2-, Oncotype RS 30 -diagnosed in 02/2015. S/p right breast lumpectomy, adjuvant chemo and adjuvant radiation.  -She started antiestrogen therapy with letrozole in 09/2015. Tolerating well with manageable hot flashes and some joint stiffness. Plan for 7 years. She completed in March 2024  -Unclear surveillance now, we will continue annual mammogram.    Assessment and Plan    Breast Cancer -She completed adjuvant letrozole, she took a total of 7.5 years. -She is clinically doing well, no concern for recurrence.  Will continue breast cancer surveillance. - last mammogram on March 28, 2023 was normal. Breast density is low (A), which increases the sensitivity of mammograms. -Continue annual mammograms. -Return for oncology follow-up in one year (September 2025).  Colon Cancer In remission since 2010. No current concerns. -Continue routine follow-up.  Neuropathy Reports tightness in the leg. Currently taking gabapentin. -Continue gabapentin, refill to last until next year. -Start B complex vitamins for nerve regeneration.  General Health Maintenance -Continue current medications including hydrochlorothiazide, pravastatin, lisinopril, and baby aspirin. -Continue follow-up with  primary care physician for blood pressure and cholesterol management. -Order mammogram for next year before oncology follow-up.     Plan -Lab and follow-up with NP Lacie in 1 year    SUMMARY OF ONCOLOGIC HISTORY: Oncology History Overview Note  Breast cancer of upper-inner quadrant of right female breast (HCC)   Staging form: Breast, AJCC 7th Edition     Clinical: Stage IIA (T2, N0, M0) - Unsigned     Pathologic stage from 04/14/2015: Stage IIA (T2, N0, cM0) - Signed by Malachy Mood, MD on 05/05/2015 Cancer of left colon [facility])   Staging form: Colon and Rectum, AJCC 7th Edition     Pathologic: T4a, N1a, M1 - Unsigned      Breast cancer of upper-inner quadrant of right female breast (HCC)  02/27/2015 Mammogram   Diagnostic mammogram showed a 2.5 cm irregular mass within the far posterior upper inner right breast, ultrasound confirmed a 1.9 x 1.0 x 0.8 cm mass at 1:30 o'clock 15 submitted from nipple. No axillary adenopathy   03/03/2015 Initial Biopsy   Right breast needle biopsy showed invasive ductal carcinoma, grade 1-2.   03/03/2015 Receptors her2   ER 100%+, PR 80%+, ki67 15%, HER2/neu negative   03/03/2015 Clinical Stage   Stage IIA: T2 N0   03/18/2015 Procedure   VUS at POLD1 gene called c.327G>C. neg at APC, ATM, AXIN2, BARD1, BMPR1A, BRCA1, BRCA2, BRIP1, CDH1, CDK4, CDKN2A, CHEK2, EPCAM, FANCC, MLH1, MSH2, MSH6, MUTYH, NBN, PALB2, PMS2, POLD1, POLE, PTEN, RAD51C, RAD51D, SCG5/GREM1, SMAD4, STK11, TP53, VHL,XRCC2   04/14/2015 Surgery   Right breast lumpectomy and sentinel lymph node biopsy. Surgical margins were negative.   04/14/2015 Pathology Results   right breast invasive ductal carcinoma, grade 2, 2.7 cm,(+) DCIS, margins were negative, 4 sentinel lymph nodes and one axillary lymph nodes were negative, (+)lymphovascular  invasion.   04/14/2015 Pathologic Stage   Stage IIA: T2 N0   04/14/2015 Oncotype testing   RS 30, which predicts 10-year risk of distance recurrence with  tamoxifen alone 20%, intermediate risk   05/21/2015 - 07/24/2015 Adjuvant Chemotherapy    Adjuvant chemotherapy with docetaxel 75 mg/m, and Cytoxan 600 mg/m X 4 cycles   08/17/2015 - 09/13/2015 Radiation Therapy   Adjuvant RT: Right breast treated to 42.5 Gy in 17 fractions, Right breast boost treated to 7.5 Gy in 3 fractions   10/14/2015 -  Anti-estrogen oral therapy   Letrozole 2.5 mg daily   11/13/2015 Survivorship   Survivorship visit completed   03/24/2023 Mammogram    IMPRESSION: No mammographic evidence of malignancy. A result letter of this screening mammogram will be mailed directly to the patient.      Discussed the use of AI scribe software for clinical note transcription with the patient, who gave verbal consent to proceed.  History of Present Illness   The patient, with a history of breast cancer and colon cancer, presents for a routine follow-up. They report no new symptoms or concerns. Their appetite is normal and their weight has remained stable. They have been performing self-breast exams intermittently and report no abnormalities. Their last mammogram was a few weeks ago and was normal. They have a low breast density, which is favorable for mammogram sensitivity. Their bone density scan in 2022 was also normal.  The patient completed treatment for breast cancer in 2016 and for colon cancer in 2010. They have been on Letrozole for seven years, which they finished earlier this year. They report no significant changes since stopping the medication, although they still experience hot flashes.  The patient also reports some tightness in their leg, which they are unsure if it is related to neuropathy. They have been trying to manage this with walking and are considering getting a stationary bike. They are currently taking gabapentin for this issue.  They also mention some occasional pain in the area where they had a lipoma removed many years ago. They have been managing this with  over-the-counter pain medication as needed.         All other systems were reviewed with the patient and are negative.  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 echo 1/11. EF 65-70%    Neuromuscular disorder (HCC)    peripheral neuropathy feet   Pericardial effusion    echo 08/13/09   Peripheral vascular disease (HCC)    Personal history of chemotherapy 2016   Right Breast Cancer   Personal history of radiation therapy 2016   Right Breast Cancer   S/P radiation therapy 08/18/15-09/14/15   right breast   Shortness of breath    Thrombocytopenia (HCC)     SURGICAL HISTORY: Past Surgical History:  Procedure Laterality Date   BREAST LUMPECTOMY Right 04/14/2015   BREAST LUMPECTOMY WITH RADIOACTIVE SEED AND SENTINEL LYMPH NODE BIOPSY Right 04/14/2015   Procedure: RIGHT BREAST LUMPECTOMY WITH RADIOACTIVE SEED ANDAXILLARY SENTINEL LYMPH NODE BIOPSY;  Surgeon: Avel Peace, MD;  Location: [facility];  Service: General;  Laterality: Right;   COLON SURGERY  2010   COLONOSCOPY  07/22/2010,11-20-13   COLOSTOMY CLOSURE     CRANIOTOMY  03/31/11   left parietal mass resection   CRANIOTOMY  11/10/2011   Procedure: CRANIOTOMY TUMOR EXCISION;  Surgeon: [name], MD;  Location: [facility];  Service: Neurosurgery;  Laterality: N/A;  Craniotomy for Biopsy of Tumor   PORTACATH PLACEMENT     10   ROTATOR CUFF  REPAIR     rt   TONSILLECTOMY     TONSILLECTOMY     TUBAL LIGATION      I have reviewed the social history and family history with the patient and they are unchanged from previous note.  ALLERGIES:  is allergic to elemental sulfur and sulfonamide derivatives.  MEDICATIONS:  Current Outpatient Medications  Medication Sig Dispense Refill   acetaminophen (TYLENOL) 500 MG tablet Take 1,000 mg by mouth every 6 (six) hours as needed for mild pain, moderate pain or headache. Reported on 09/30/2015 (Patient not taking: Reported on 05/03/2022)     aspirin 81 MG chewable tablet aspirin 81 mg chewable tablet  Chew 1 tablet every day by oral route.     fluticasone (FLONASE) 50 MCG/ACT nasal spray Place 1 spray into both nostrils daily as needed for allergies. Reported on 11/12/2015     gabapentin (NEURONTIN) 300 MG capsule Take 1 capsule (300 mg total) by mouth at bedtime. 90 capsule 3   hydrochlorothiazide (HYDRODIURIL) 12.5 MG tablet Take 12.5 mg by mouth daily.     lisinopril (PRINIVIL,ZESTRIL) 40 MG tablet Take 1 tablet (40 mg total) by mouth daily. 90 tablet 0   loratadine (CLARITIN) 10 MG tablet Take 10 mg by mouth daily.     pravastatin (PRAVACHOL) 20 MG tablet Take 20 mg by mouth daily.      Pyridoxine HCl (VITAMIN B-6 PO) Take by mouth.     VITAMIN D, ERGOCALCIFEROL, PO Take 2 drops every other day by mouth. 2000 units     No current facility-administered medications for this visit.    PHYSICAL EXAMINATION: ECOG PERFORMANCE STATUS: 0 - Asymptomatic  Vitals:   04/10/23 1145  BP: 122/82  Pulse: 92  Resp: 18  Temp: 98.4 F (36.9 C)  SpO2: 95%   Wt Readings from Last 3 Encounters:  04/10/23 173 lb 1.6 oz (78.5 kg)  06/02/22 175 lb (79.4 kg)  05/03/22 175 lb (79.4 kg)     GENERAL:alert, no distress and comfortable SKIN: skin color, texture, turgor are normal, no rashes or significant lesions EYES: normal, Conjunctiva are pink and non-injected, sclera clear NECK: supple,  thyroid normal size, non-tender, without nodularity LYMPH:  no palpable lymphadenopathy in the cervical, axillary  LUNGS: clear to auscultation and percussion with normal breathing effort HEART: regular rate & rhythm and no murmurs and no lower extremity edema ABDOMEN:abdomen soft, non-tender and normal bowel sounds BREAST: Incision present on right breast. Incision present on left breast from procedure 20-30 years ago.  No palpable masses in breasts or palpable adenopathy in axilla  MUSCULOSKELETAL: Tenderness in the area of previous lipoma excision on the right side.      LABORATORY DATA:  I have reviewed the data as listed  Latest Ref Rng & Units 04/10/2023   11:15 AM 04/08/2022    3:10 PM 09/30/2021    9:33 AM  CBC  WBC 4.0 - 10.5 K/uL 8.1  9.3  7.1   Hemoglobin 12.0 - 15.0 g/dL 24.4  01.0  27.2   Hematocrit 36.0 - 46.0 % 48.7  47.2  46.9   Platelets 150 - 400 K/uL 199  206  216         Latest Ref Rng & Units 04/10/2023   11:15 AM 04/08/2022    3:10 PM 09/30/2021    9:33 AM  CMP  Glucose 70 - 99 mg/dL 536  96  644   BUN 8 - 23 mg/dL 15  12  16    Creatinine 0.44 - 1.00 mg/dL 0.34  7.42  5.95   Sodium 135 - 145 mmol/L 138  140  140   Potassium 3.5 - 5.1 mmol/L 3.6  4.1  3.7   Chloride 98 - 111 mmol/L 100  104  102   CO2 22 - 32 mmol/L 32  33  33   Calcium 8.9 - 10.3 mg/dL 63.8  9.7  75.6   Total Protein 6.5 - 8.1 g/dL 7.4  7.5  7.7   Total Bilirubin 0.3 - 1.2 mg/dL 1.3  1.0  1.2   Alkaline Phos 38 - 126 U/L 91  83  97   AST 15 - 41 U/L 24  42  23   ALT 0 - 44 U/L 30  48  30       RADIOGRAPHIC STUDIES: I have personally reviewed the radiological images as listed and agreed with the findings in the report. No results found.    Orders Placed This Encounter  Procedures   MM 3D SCREENING MAMMOGRAM BILATERAL BREAST    Standing Status:   Future    Standing Expiration Date:   04/09/2024    Order Specific Question:   Reason for Exam (SYMPTOM  OR DIAGNOSIS REQUIRED)     Answer:   routine screening    Order Specific Question:   Preferred imaging location?    Answer:   [facility]    Order Specific Question:   Release to patient    Answer:   Immediate   All questions were answered. The patient knows to call the clinic with any problems, questions or concerns. No barriers to learning was detected. The total time spent in the appointment was 25 minutes.     Malachy Mood, MD 04/10/2023

## 2023-10-19 ENCOUNTER — Other Ambulatory Visit: Payer: Self-pay | Admitting: Hematology

## 2023-10-19 DIAGNOSIS — C186 Malignant neoplasm of descending colon: Secondary | ICD-10-CM

## 2023-11-22 ENCOUNTER — Ambulatory Visit: Admitting: Orthopedic Surgery

## 2023-11-22 ENCOUNTER — Encounter: Payer: Self-pay | Admitting: Orthopedic Surgery

## 2023-11-22 ENCOUNTER — Other Ambulatory Visit (INDEPENDENT_AMBULATORY_CARE_PROVIDER_SITE_OTHER): Payer: Self-pay

## 2023-11-22 VITALS — BP 123/78 | HR 88 | Ht 64.0 in | Wt 174.0 lb

## 2023-11-22 DIAGNOSIS — D649 Anemia, unspecified: Secondary | ICD-10-CM | POA: Insufficient documentation

## 2023-11-22 DIAGNOSIS — M25511 Pain in right shoulder: Secondary | ICD-10-CM

## 2023-11-22 DIAGNOSIS — G8929 Other chronic pain: Secondary | ICD-10-CM | POA: Diagnosis not present

## 2023-11-22 DIAGNOSIS — E669 Obesity, unspecified: Secondary | ICD-10-CM | POA: Insufficient documentation

## 2023-11-22 NOTE — Progress Notes (Deleted)
 New Patient Visit  Assessment: Kayla [name] is a 73 y.o. female with the following: 1. Chronic right shoulder pain ***   Plan: Donnal Fusi   Follow-up: Return if symptoms worsen or fail to improve.  Subjective:  Chief Complaint  Patient presents with   Shoulder Pain    Right     History of Present Illness: Kayla [name] is a 73 y.o. 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 echo 1/11. EF 65-70%    Neuromuscular disorder (HCC)    peripheral neuropathy feet   Pericardial effusion    echo 08/13/09   Peripheral vascular disease (HCC)    Personal history of chemotherapy 2016   Right Breast Cancer   Personal history of radiation therapy 2016   Right Breast Cancer   S/P radiation therapy 08/18/15-09/14/15   right breast   Shortness of breath    Thrombocytopenia (HCC)     Past Surgical History:  Procedure Laterality Date   BREAST LUMPECTOMY Right 04/14/2015   BREAST LUMPECTOMY WITH RADIOACTIVE SEED AND SENTINEL LYMPH NODE BIOPSY Right 04/14/2015   Procedure: RIGHT BREAST LUMPECTOMY WITH RADIOACTIVE SEED ANDAXILLARY SENTINEL LYMPH NODE BIOPSY;  Surgeon: Adalberto Hollow, MD;   Location: [facility];  Service: General;  Laterality: Right;   COLON SURGERY  2010   COLONOSCOPY  07/22/2010,11-20-13   COLOSTOMY CLOSURE     CRANIOTOMY  03/31/11   left parietal mass resection   CRANIOTOMY  11/10/2011   Procedure: CRANIOTOMY TUMOR EXCISION;  Surgeon: Manya Sells, MD;  Location: [facility];  Service: Neurosurgery;  Laterality: N/A;  Craniotomy for Biopsy of Tumor   PORTACATH PLACEMENT     10   ROTATOR CUFF REPAIR     rt   TONSILLECTOMY     TONSILLECTOMY     TUBAL LIGATION      Family History  Problem Relation Age of Onset   Breast cancer Mother 78   Diabetes Father    Coronary artery disease Father    Gout Brother    Colon polyps Brother    Breast cancer Cousin        dx in her 14s   Colon cancer Cousin        mother's maternal first cousin   Diabetes Maternal Aunt    Cirrhosis Brother    Esophageal cancer Neg Hx    Rectal cancer Neg Hx    Stomach cancer Neg Hx    Social History   Tobacco Use   Smoking status: Former    Current packs/day: 0.00  Average packs/day: 0.3 packs/day for 37.0 years (9.3 ttl pk-yrs)    Types: Cigarettes    Start date: 11/04/1971    Quit date: 11/03/2008    Years since quitting: 15.0   Smokeless tobacco: Never   Tobacco comments:    30 pack year hx   Vaping Use   Vaping status: Never Used  Substance Use Topics   Alcohol use: Not Currently    Alcohol/week: 1.0 standard drink of alcohol    Types: 1 Cans of beer per week   Drug use: No    Allergies  Allergen Reactions   Elemental Sulfur  Rash   Sulfonamide Derivatives Rash    No outpatient medications have been marked as taking for the 11/22/23 encounter (Office Visit) with Tonita Frater, MD.    Objective: BP 123/78   Pulse 88   Ht 5\' 4"  (1.626 m)   Wt 174 lb (78.9 kg)   BMI 29.87 kg/m   Physical Exam:  General: {General PE Findings:25791} Gait: {Gait:25792}    IMAGING: {XR Reviewed:24899}   New Medications:  No orders of the  defined types were placed in this encounter.     Tonita Frater, MD  11/22/2023 11:32 AM

## 2023-11-22 NOTE — Progress Notes (Signed)
 New Patient Visit  Assessment: Kayla [name] is a 73 y.o. female with the following: 1. Chronic right shoulder pain  Plan: MELROSE CONLON has pain in the right shoulder.  Pain is primarily over the posterior aspect of the scapula.  She has excellent range of motion, as well as good strength.  Radiographs demonstrate some mild degenerative changes of glenohumeral joint, and some degenerative changes of the [facility] joint.  No specific injury.  Low concern for a repeat tear of the right rotator cuff.  This was discussed with the patient.  I think she will benefit from some exercises, and she would like to try a home excise program.  In addition, she is interested in an injection.  This was completed in clinic today.  Procedure note injection - Right shoulder    Verbal consent was obtained to inject the right shoulder, subacromial space Timeout was completed to confirm the site of injection.   The skin was prepped with alcohol and ethyl chloride was sprayed at the injection site.  A 21-gauge needle was used to inject 40 mg of Depo-Medrol and 1% lidocaine  (4 cc) into the subacromial space of the right shoulder using a posterolateral approach.  There were no complications.  A sterile bandage was applied.    Follow-up: Return if symptoms worsen or fail to improve.  Subjective:  Chief Complaint  Patient presents with   Shoulder Pain    Right     History of Present Illness: Kayla [name] is a 74 y.o. female who has been referred by  Eliott Guess, FNP for evaluation of right shoulder pain.  She states that she has had pain in the right shoulder for a while.  No specific injury.  She does have a history of right shoulder rotator cuff repair, completed more than 10 years ago.  She states that this improved her pain and function.  However, for greater than a year, she has had pain in the posterior aspect of the shoulder.  Pain is primarily over the scapula.  She will occasionally take Tylenol .  She  has not worked with therapy.  No recent injections.   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 echo 1/11. EF 65-70%    Neuromuscular disorder (HCC)    peripheral neuropathy feet   Pericardial effusion    echo 08/13/09   Peripheral vascular disease [facility])    Personal history of chemotherapy 2016   Right Breast Cancer   Personal history of radiation therapy 2016   Right Breast Cancer   S/P radiation therapy 08/18/15-09/14/15   right breast   Shortness of breath  Thrombocytopenia [facility])     Past Surgical History:  Procedure Laterality Date   BREAST LUMPECTOMY Right 04/14/2015   BREAST LUMPECTOMY WITH RADIOACTIVE SEED AND SENTINEL LYMPH NODE BIOPSY Right 04/14/2015   Procedure: RIGHT BREAST LUMPECTOMY WITH RADIOACTIVE SEED ANDAXILLARY SENTINEL LYMPH NODE BIOPSY;  Surgeon: Adalberto Hollow, MD;  Location: [facility];  Service: General;  Laterality: Right;   COLON SURGERY  2010   COLONOSCOPY  07/22/2010,11-20-13   COLOSTOMY CLOSURE     CRANIOTOMY  03/31/11   left parietal mass resection   CRANIOTOMY  11/10/2011   Procedure: CRANIOTOMY TUMOR EXCISION;  Surgeon: Manya Sells, MD;  Location: [facility];  Service: Neurosurgery;  Laterality:  N/A;  Craniotomy for Biopsy of Tumor   PORTACATH PLACEMENT     10   ROTATOR CUFF REPAIR     rt   TONSILLECTOMY     TONSILLECTOMY     TUBAL LIGATION      Family History  Problem Relation Age of Onset   Breast cancer Mother 70   Diabetes Father    Coronary artery disease Father    Gout Brother    Colon polyps Brother    Breast cancer Cousin        dx in her 96s   Colon cancer Cousin        mother's maternal first cousin   Diabetes Maternal Aunt    Cirrhosis Brother    Esophageal cancer Neg Hx    Rectal cancer Neg Hx    Stomach cancer Neg Hx    Social History   Tobacco Use   Smoking status: Former    Current packs/day: 0.00    Average packs/day: 0.3 packs/day for 37.0 years (9.3 ttl pk-yrs)    Types: Cigarettes    Start date: 11/04/1971    Quit date: 11/03/2008    Years since quitting: 15.0   Smokeless tobacco: Never   Tobacco comments:    30 pack year hx   Vaping Use   Vaping status: Never Used  Substance Use Topics   Alcohol use: Not Currently    Alcohol/week: 1.0 standard drink of alcohol    Types: 1 Cans of beer per week   Drug use: No    Allergies  Allergen Reactions   Elemental Sulfur  Rash   Sulfonamide Derivatives Rash    Current Meds  Medication Sig   aspirin  81 MG chewable tablet aspirin  81 mg chewable tablet  Chew 1 tablet every day by oral route.   fluticasone  (FLONASE ) 50 MCG/ACT nasal spray Place 1 spray into both nostrils daily as needed for allergies. Reported on 11/12/2015   gabapentin  (NEURONTIN ) 300 MG capsule Take 1 capsule by mouth at bedtime   hydrochlorothiazide (HYDRODIURIL) 12.5 MG tablet Take 12.5 mg by mouth daily.   lisinopril  (PRINIVIL ,ZESTRIL ) 40 MG tablet Take 1 tablet (40 mg total) by mouth daily.   loratadine (CLARITIN) 10 MG tablet Take 10 mg by mouth daily.   pravastatin (PRAVACHOL) 20 MG tablet Take 20 mg by mouth daily.    Pyridoxine HCl (VITAMIN B-6 PO) Take by mouth.   VITAMIN D, ERGOCALCIFEROL, PO Take 2 drops every  other day by mouth. 2000 units    Objective: BP 123/78   Pulse 88   Ht 5\' 4"  (1.626 m)   Wt 174 lb (78.9 kg)   BMI 29.87 kg/m   Physical Exam:  General: Alert and oriented. and No acute distress. Gait: Normal gait.  Right shoulder without deformity.  Surgical incisions are  healed.  Tenderness palpation over the scapula, especially the lateral and inferior portion of the scapula.  She has full range of motion of the right shoulder.  5/5 strength throughout the right upper extremity.  Sensation intact in the axillary nerve distribution.  Sensation intact in the right hand.  No atrophy is appreciated.  IMAGING: I personally ordered and reviewed the following images   X-rays of the right shoulder were obtained in clinic today.  No acute injuries are noted.  Well-positioned glenohumeral joint.  Mild loss of joint space, with small inferior osteophytes.  No evidence of proximal humeral migration.  Evidence of prior surgery is visible.  Degenerative changes noted the [facility] joint, including superior osteophytes.  Impression: Negative right shoulder x-ray   New Medications:  No orders of the defined types were placed in this encounter.     Tonita Frater, MD  11/22/2023 12:00 PM

## 2023-11-22 NOTE — Patient Instructions (Signed)
Rotator Cuff Tear/Tendinitis Rehab   Ask your health care provider which exercises are safe for you. Do exercises exactly as told by your health care provider and adjust them as directed. It is normal to feel mild stretching, pulling, tightness, or discomfort as you do these exercises. Stop right away if you feel sudden pain or your pain gets worse. Do not begin these exercises until told by your health care provider. Stretching and range-of-motion exercises  These exercises warm up your muscles and joints and improve the movement and flexibility of your shoulder. These exercises also help to relieve pain.  Shoulder pendulum In this exercise, you let the injured arm dangle toward the floor and then swing it like a clock pendulum. Stand near a table or counter that you can hold onto for balance. Bend forward at the waist and let your left / right arm hang straight down. Use your other arm to support you and help you stay balanced. Relax your left / right arm and shoulder muscles, and move your hips and your trunk so your left / right arm swings freely. Your arm should swing because of the motion of your body, not because you are using your arm or shoulder muscles. Keep moving your hips and trunk so your arm swings in the following directions, as told by your health care provider: Side to side. Forward and backward. In clockwise and counterclockwise circles. Slowly return to the starting position. Repeat 10 times, or for 10 seconds per direction. Complete this exercise 2-3 times a day.      Shoulder flexion, seated This exercise is sometimes called table slides. In this exercise, you raise your arm in front of your body until you feel a stretch in your injured shoulder. Sit in a stable chair so your left / right forearm can rest on a flat surface. Your elbow should rest at a height that keeps your upper arm next to your body. Keeping your left / right shoulder relaxed, lean forward at the waist  and let your hand slide forward (flexion). Stop when you feel a stretch in your shoulder, or when you reach the angle that is recommended by your health care provider. Hold for 5 seconds. Slowly return to the starting position. Repeat 10 times. Complete this exercise 1-2  times a day.       Shoulder flexion, standing In this exercise, you raise your arm in front of your body (flexion) until you feel a stretch in your injured shoulder. Stand and hold a broomstick, a cane, or a similar object. Place your hands a little more than shoulder-width apart on the object. Your left / right hand should be palm-up, and your other hand should be palm-down. Keep your elbow straight and your shoulder muscles relaxed. Push the stick up with your healthy arm to raise your left / right arm in front of your body, and then over your head until you feel a stretch in your shoulder. Avoid shrugging your shoulder while you raise your arm. Keep your shoulder blade tucked down toward the middle of your back. Keep your left / right shoulder muscles relaxed. Hold for 10 seconds. Slowly return to the starting position. Repeat 10 times. Complete this exercise 1-2 times a day.      Shoulder abduction, active-assisted You will need a stick, broom handle, or similar object to help you (assist) in doing this exercise. Lie on your back. This is the supine position. Hold a broomstick, a cane, or a similar  object. Place your hands a little more than shoulder-width apart on the object. Your left / right hand should be palm-up, and your other hand should be palm-down. Keeping your shoulder relaxed, push the stick to raise your left / right arm out to your side (abduction) and then over your head. Use your other hand to help move the stick. Stop when you feel a stretch in your shoulder, or when you reach the angle that is recommended by your health care provider. Avoid shrugging your shoulder while you raise your arm. Keep your  shoulder blade tucked down toward the middle of your back. Hold for 10 seconds. Slowly return to the starting position. Repeat 10 times. Complete this exercise 1-2 times a day.      Shoulder flexion, active-assisted Lie on your back. You may bend your knees for comfort. Hold a broomstick, a cane, or a similar object so that your hands are about shoulder-width apart. Your palms should face toward your feet. Raise your left / right arm over your head, then behind your head toward the floor (flexion). Use your other hand to help you do this (active-assisted). Stop when you feel a gentle stretch in your shoulder, or when you reach the angle that is recommended by your health care provider. Hold for 10 seconds. Use the stick and your other arm to help you return your left / right arm to the starting position. Repeat 10 times. Complete this exercise 1-2 times a day.      External rotation Sit in a stable chair without armrests, or stand up. Tuck a soft object, such as a folded towel or a small ball, under your left / right upper arm. Hold a broomstick, a cane, or a similar object with your palms face-down, toward the floor. Bend your elbows to a 90-degree angle (right angle), and keep your hands about shoulder-width apart. Straighten your healthy arm and push the stick across your body, toward your left / right side. Keep your left / right arm bent. This will rotate your left / right forearm away from your body (external rotation). Hold for 10 seconds. Slowly return to the starting position. Repeat 10 times. Complete this exercise 1-2 times a day.        Strengthening exercises These exercises build strength and endurance in your shoulder. Endurance is the ability to use your muscles for a long time, even after they get tired. Do not start doing these exercises until your health care provider approves. Shoulder flexion, isometric Stand or sit in a doorway, facing the door frame. Keep your  left / right arm straight and make a gentle fist with your hand. Place your fist against the door frame. Only your fist should be touching the frame. Keep your upper arm at your side. Gently press your fist against the door frame, as if you are trying to raise your arm above your head (isometric shoulder flexion). Avoid shrugging your shoulder while you press your hand into the door frame. Keep your shoulder blade tucked down toward the middle of your back. Hold for 10 seconds. Slowly release the tension, and relax your muscles completely before you repeat the exercise. Repeat 10 times. Complete this exercise 3 times per week.      Shoulder abduction, isometric Stand or sit in a doorway. Your left / right arm should be closest to the door frame. Keep your left / right arm straight, and place the back of your hand against the door frame. Only  your hand should be touching the frame. Keep the rest of your arm close to your side. Gently press the back of your hand against the door frame, as if you are trying to raise your arm out to the side (isometric shoulder abduction). Avoid shrugging your shoulder while you press your hand into the door frame. Keep your shoulder blade tucked down toward the middle of your back. Hold for 10 seconds. Slowly release the tension, and relax your muscles completely before you repeat the exercise. Repeat 10 times. Complete this exercise 3 times per week.      Internal rotation, isometric This is an exercise in which you press your palm against a door frame without moving your shoulder joint (isometric). Stand or sit in a doorway, facing the door frame. Bend your left / right elbow, and place the palm of your hand against the door frame. Only your palm should be touching the frame. Keep your upper arm at your side. Gently press your hand against the door frame, as if you are trying to push your arm toward your abdomen (internal rotation). Gradually increase the  pressure until you are pressing as hard as you can. Stop increasing the pressure if you feel shoulder pain. Avoid shrugging your shoulder while you press your hand into the door frame. Keep your shoulder blade tucked down toward the middle of your back. Hold for 10 seconds. Slowly release the tension, and relax your muscles completely before you repeat the exercise. Repeat 10 times. Complete this exercise 3 times per week.      External rotation, isometric This is an exercise in which you press the back of your wrist against a door frame without moving your shoulder joint (isometric). Stand or sit in a doorway, facing the door frame. Bend your left / right elbow and place the back of your wrist against the door frame. Only the back of your wrist should be touching the frame. Keep your upper arm at your side. Gently press your wrist against the door frame, as if you are trying to push your arm away from your abdomen (external rotation). Gradually increase the pressure until you are pressing as hard as you can. Stop increasing the pressure if you feel pain. Avoid shrugging your shoulder while you press your wrist into the door frame. Keep your shoulder blade tucked down toward the middle of your back. Hold for 10 seconds. Slowly release the tension, and relax your muscles completely before you repeat the exercise. Repeat 10 times. Complete this exercise 3 times per week.       Scapular retraction Sit in a stable chair without armrests, or stand up. Secure an exercise band to a stable object in front of you so the band is at shoulder height. Hold one end of the exercise band in each hand. Your palms should face down. Squeeze your shoulder blades together (retraction) and move your elbows slightly behind you. Do not shrug your shoulders upward while you do this. Hold for 10 seconds. Slowly return to the starting position. Repeat 10 times. Complete this exercise 3 times per week.       Shoulder extension Sit in a stable chair without armrests, or stand up. Secure an exercise band to a stable object in front of you so the band is above shoulder height. Hold one end of the exercise band in each hand. Straighten your elbows and lift your hands up to shoulder height. Squeeze your shoulder blades together and pull your hands  down to the sides of your thighs (extension). Stop when your hands are straight down by your sides. Do not let your hands go behind your body. Hold for 10 seconds. Slowly return to the starting position. Repeat 10 times. Complete this exercise 3 times per week.       Scapular protraction, supine Lie on your back on a firm surface (supine position). Hold a 5 lbs (or soup can) weight in your left / right hand. Raise your left / right arm straight into the air so your hand is directly above your shoulder joint. Push the weight into the air so your shoulder (scapula) lifts off the surface that you are lying on. The scapula will push up or forward (protraction). Do not move your head, neck, or back. Hold for 10 seconds. Slowly return to the starting position. Let your muscles relax completely before you repeat this exercise.  Repeat 10 times. Complete this exercise 3 times per week.         Instructions Following Joint Injections  In clinic today, you received an injection in one of your joints (sometimes more than one).  Occasionally, you can have some pain at the injection site, this is normal.  You can place ice at the injection site, or take over-the-counter medications such as Tylenol (acetaminophen) or Advil (ibuprofen).  Please follow all directions listed on the bottle.  If your joint (knee or shoulder) becomes swollen, red or very painful, please contact the clinic for additional assistance.   Two medications were injected, including lidocaine and a steroid (often referred to as cortisone).  Lidocaine is effective almost immediately but wears off  quickly.  However, the steroid can take a few days to improve your symptoms.  In some cases, it can make your pain worse for a couple of days.  Do not be concerned if this happens as it is common.  You can apply ice or take some over-the-counter medications as needed.   Injections in the same joint cannot be repeated for 3 months.  This helps to limit the risk of an infection in the joint.  If you were to develop an infection in your joint, the best treatment option would be surgery.

## 2024-01-30 ENCOUNTER — Other Ambulatory Visit: Payer: Self-pay | Admitting: Hematology

## 2024-01-30 DIAGNOSIS — C186 Malignant neoplasm of descending colon: Secondary | ICD-10-CM

## 2024-03-25 ENCOUNTER — Ambulatory Visit
Admission: RE | Admit: 2024-03-25 | Discharge: 2024-03-25 | Disposition: A | Discharge status: Home / Self Care | Source: Ambulatory Visit | Attending: Hematology | Admitting: Hematology

## 2024-03-25 DIAGNOSIS — Z170 Estrogen receptor positive status [ER+]: Secondary | ICD-10-CM

## 2024-04-09 ENCOUNTER — Inpatient Hospital Stay: Payer: Medicare Other | Attending: Nurse Practitioner

## 2024-04-09 ENCOUNTER — Encounter: Payer: Self-pay | Admitting: Nurse Practitioner

## 2024-04-09 ENCOUNTER — Inpatient Hospital Stay (HOSPITAL_BASED_OUTPATIENT_CLINIC_OR_DEPARTMENT_OTHER): Payer: Medicare Other | Admitting: Nurse Practitioner

## 2024-04-09 VITALS — BP 118/59 | HR 72 | Temp 97.4°F | Resp 15 | Wt 174.5 lb

## 2024-04-09 DIAGNOSIS — Z9221 Personal history of antineoplastic chemotherapy: Secondary | ICD-10-CM | POA: Diagnosis not present

## 2024-04-09 DIAGNOSIS — Z8500 Personal history of malignant neoplasm of unspecified digestive organ: Secondary | ICD-10-CM | POA: Insufficient documentation

## 2024-04-09 DIAGNOSIS — Z170 Estrogen receptor positive status [ER+]: Secondary | ICD-10-CM | POA: Diagnosis not present

## 2024-04-09 DIAGNOSIS — C186 Malignant neoplasm of descending colon: Secondary | ICD-10-CM

## 2024-04-09 DIAGNOSIS — Z923 Personal history of irradiation: Secondary | ICD-10-CM | POA: Diagnosis not present

## 2024-04-09 DIAGNOSIS — C50211 Malignant neoplasm of upper-inner quadrant of right female breast: Secondary | ICD-10-CM

## 2024-04-09 DIAGNOSIS — Z853 Personal history of malignant neoplasm of breast: Secondary | ICD-10-CM | POA: Insufficient documentation

## 2024-04-09 LAB — CMP (CANCER CENTER ONLY)
ALT: 19 U/L (ref 0–44)
AST: 19 U/L (ref 15–41)
Albumin: 4.2 g/dL (ref 3.5–5.0)
Alkaline Phosphatase: 80 U/L (ref 38–126)
Anion gap: 7 (ref 5–15)
BUN: 17 mg/dL (ref 8–23)
CO2: 29 mmol/L (ref 22–32)
Calcium: 9.8 mg/dL (ref 8.9–10.3)
Chloride: 104 mmol/L (ref 98–111)
Creatinine: 1.06 mg/dL — ABNORMAL HIGH (ref 0.44–1.00)
GFR, Estimated: 56 mL/min — ABNORMAL LOW (ref >60)
Glucose, Bld: 114 mg/dL — ABNORMAL HIGH (ref 70–99)
Potassium: 3.9 mmol/L (ref 3.5–5.1)
Sodium: 140 mmol/L (ref 135–145)
Total Bilirubin: 1 mg/dL (ref 0.0–1.2)
Total Protein: 7.3 g/dL (ref 6.5–8.1)

## 2024-04-09 LAB — CBC WITH DIFFERENTIAL (CANCER CENTER ONLY)
Abs Immature Granulocytes: 0.04 K/uL (ref 0.00–0.07)
Basophils Absolute: 0.1 K/uL (ref 0.0–0.1)
Basophils Relative: 1 %
Eosinophils Absolute: 0.1 K/uL (ref 0.0–0.5)
Eosinophils Relative: 2 %
HCT: 42.5 % (ref 36.0–46.0)
Hemoglobin: 14.2 g/dL (ref 12.0–15.0)
Immature Granulocytes: 1 %
Lymphocytes Relative: 19 %
Lymphs Abs: 1.4 K/uL (ref 0.7–4.0)
MCH: 28.2 pg (ref 26.0–34.0)
MCHC: 33.4 g/dL (ref 30.0–36.0)
MCV: 84.3 fL (ref 80.0–100.0)
Monocytes Absolute: 0.5 K/uL (ref 0.1–1.0)
Monocytes Relative: 6 %
Neutro Abs: 5.5 K/uL (ref 1.7–7.7)
Neutrophils Relative %: 71 %
Platelet Count: 207 K/uL (ref 150–400)
RBC: 5.04 MIL/uL (ref 3.87–5.11)
RDW: 14 % (ref 11.5–15.5)
WBC Count: 7.6 K/uL (ref 4.0–10.5)
nRBC: 0 % (ref 0.0–0.2)

## 2024-04-09 NOTE — Progress Notes (Signed)
 [facility]   [phone]) [phone] Fax:([phone]    Patient Care Team: Zhou-Talbert, Stephens RAMAN, MD as PCP - General (Family Medicine) Cecillia Lyle Side, PA (General Surgery) Dewey Rush, MD as Consulting Physician (Radiation Oncology) Lily Boas, MD as Consulting Physician (General Surgery) Lanny Callander, MD as Consulting Physician (Hematology) Moses Powell Hummer, NP as Nurse Practitioner (Hematology and Oncology)   CHIEF COMPLAINT: Follow-up history of breast and colon cancers  Oncology History Overview Note  Breast cancer of upper-inner quadrant of right female breast [facility])   Staging form: Breast, AJCC 7th Edition     Clinical: Stage IIA (T2, N0, M0) - Unsigned     Pathologic stage from 04/14/2015: Stage IIA (T2, N0, cM0) - Signed by Callander Lanny, MD on 05/05/2015 Cancer of left colon [facility])   Staging form: Colon and Rectum, AJCC 7th Edition     Pathologic: T4a, N1a, M1 - Unsigned      Breast cancer of upper-inner quadrant of right female breast (HCC)  02/27/2015 Mammogram   Diagnostic mammogram showed a 2.5 cm irregular mass within the far posterior upper inner right breast, ultrasound confirmed a 1.9 x 1.0 x 0.8 cm mass at 1:30 o'clock 15 submitted from nipple. No axillary adenopathy   03/03/2015 Initial Biopsy   Right breast needle biopsy showed invasive ductal carcinoma, grade 1-2.   03/03/2015 Receptors her2   ER 100%+, PR 80%+, ki67 15%, HER2/neu negative   03/03/2015 Clinical Stage   Stage IIA: T2 N0   03/18/2015 Procedure   VUS at POLD1 gene called c.327G>C. neg at APC, ATM, AXIN2, BARD1, BMPR1A, BRCA1, BRCA2, BRIP1, CDH1, CDK4, CDKN2A, CHEK2, EPCAM, FANCC, MLH1, MSH2, MSH6, MUTYH, NBN, PALB2, PMS2, POLD1, POLE, PTEN, RAD51C, RAD51D, SCG5/GREM1, SMAD4, STK11, TP53, VHL,XRCC2   04/14/2015 Surgery   Right breast lumpectomy and sentinel lymph node biopsy. Surgical margins were negative.   04/14/2015 Pathology Results   right breast  invasive ductal carcinoma, grade 2, 2.7 cm,(+) DCIS, margins were negative, 4 sentinel lymph nodes and one axillary lymph nodes were negative, (+)lymphovascular invasion.   04/14/2015 Pathologic Stage   Stage IIA: T2 N0   04/14/2015 Oncotype testing   RS 30, which predicts 10-year risk of distance recurrence with tamoxifen alone 20%, intermediate risk   05/21/2015 - 07/24/2015 Adjuvant Chemotherapy    Adjuvant chemotherapy with docetaxel  75 mg/m, and Cytoxan  600 mg/m X 4 cycles   08/17/2015 - 09/13/2015 Radiation Therapy   Adjuvant RT: Right breast treated to 42.5 Gy in 17 fractions, Right breast boost treated to 7.5 Gy in 3 fractions   10/14/2015 -  Anti-estrogen oral therapy   Letrozole  2.5 mg daily   11/13/2015 Survivorship   Survivorship visit completed   03/24/2023 Mammogram    IMPRESSION: No mammographic evidence of malignancy. A result letter of this screening mammogram will be mailed directly to the patient.      CURRENT THERAPY: Surveillance  INTERVAL HISTORY Kayla [name] returns for follow-up as scheduled, doing well with her usual aches and pains.  Denies bone pain or breast concerns such as new lump/mass, nipple discharge or inversion, or skin change.  Denies changes in her bowel habits, rectal bleeding, abdominal pain/bloating, or unintentional weight loss.  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 echo 1/11. EF 65-70%    Neuromuscular disorder (HCC)    peripheral neuropathy feet   Pericardial effusion    echo 08/13/09   Peripheral vascular disease    Personal history of chemotherapy 2016   Right Breast Cancer   Personal history of radiation therapy 2016   Right Breast Cancer   S/P radiation therapy 08/18/15-09/14/15   right breast   Shortness of breath    Thrombocytopenia      Past Surgical History:  Procedure Laterality Date   BREAST LUMPECTOMY Right 04/14/2015   BREAST LUMPECTOMY WITH RADIOACTIVE SEED AND SENTINEL LYMPH NODE BIOPSY Right 04/14/2015   Procedure: RIGHT BREAST LUMPECTOMY WITH RADIOACTIVE SEED ANDAXILLARY SENTINEL LYMPH NODE BIOPSY;  Surgeon: Krystal Russell, MD;  Location: [facility];  Service: General;  Laterality: Right;   COLON SURGERY  2010   COLONOSCOPY  07/22/2010,11-20-13   COLOSTOMY CLOSURE     CRANIOTOMY  03/31/11   left parietal mass resection   CRANIOTOMY  11/10/2011   Procedure: CRANIOTOMY TUMOR EXCISION;  Surgeon: Fairy Levels, MD;  Location: [facility];  Service: Neurosurgery;  Laterality: N/A;  Craniotomy for Biopsy of Tumor   PORTACATH PLACEMENT     10   ROTATOR CUFF REPAIR     rt   TONSILLECTOMY     TONSILLECTOMY     TUBAL LIGATION       Outpatient Encounter Medications as of 04/09/2024  Medication Sig Note   acetaminophen  (TYLENOL ) 500 MG tablet Take 1,000 mg by mouth every 6 (six) hours as needed for mild pain, moderate pain or headache. Reported on 09/30/2015 (Patient not taking: Reported on 05/03/2022)    aspirin  81 MG chewable tablet aspirin  81 mg chewable tablet  Chew 1 tablet every day by oral route.    fluticasone  (FLONASE ) 50 MCG/ACT nasal spray Place 1 spray into both nostrils daily as needed for allergies. Reported on 11/12/2015    gabapentin  (NEURONTIN ) 300 MG capsule Take 1 capsule by mouth at bedtime     hydrochlorothiazide (HYDRODIURIL) 12.5 MG tablet Take 12.5 mg by mouth daily.    lisinopril  (PRINIVIL ,ZESTRIL ) 40 MG tablet Take 1 tablet (40 mg total) by mouth daily.    loratadine (CLARITIN) 10 MG tablet Take 10 mg by mouth daily.    pravastatin (PRAVACHOL) 20 MG tablet Take 20 mg by mouth daily.  03/30/2016: Received from: External Pharmacy   Pyridoxine HCl (VITAMIN B-6 PO) Take by mouth.    VITAMIN D, ERGOCALCIFEROL, PO Take 2 drops every other day by mouth. 2000 units    No facility-administered encounter medications on file as of 04/09/2024.     Today's Vitals   04/09/24 1157  BP: (!) 118/59  Pulse: 72  Resp: 15  Temp: (!) 97.4 F (36.3 C)  TempSrc: Temporal  SpO2: 99%  Weight: 174 lb 8 oz (79.2 kg)   Body mass index is 29.95 kg/m.  PHYSICAL EXAM GENERAL:alert, no distress and comfortable SKIN: no rash  EYES: sclera clear NECK: without mass LYMPH:  no palpable cervical or supraclavicular lymphadenopathy  LUNGS: clear with normal breathing effort HEART: regular rate & rhythm, no lower extremity edema ABDOMEN: abdomen soft, non-tender and normal bowel sounds NEURO: alert & oriented x 3 with fluent speech, no focal deficits Breast exam: No palpable mass in either breast or axilla that I could appreciate   CBC    Latest Ref Rng & Units 04/09/2024   10:32 AM 04/10/2023   11:15 AM 04/08/2022    3:10 PM  CBC  WBC 4.0 - 10.5 K/uL 7.6  8.1  9.3   Hemoglobin 12.0 - 15.0 g/dL 85.7  83.5  84.1   Hematocrit 36.0 - 46.0 % 42.5  48.7  47.2   Platelets 150 - 400 K/uL 207  199  206       CMP     Latest Ref Rng & Units 04/09/2024   10:32 AM 04/10/2023   11:15 AM 04/08/2022    3:10 PM  CMP  Glucose 70 - 99 mg/dL 885  841  96   BUN 8 - 23 mg/dL 17  15  12    Creatinine 0.44 - 1.00 mg/dL 8.93  8.86  8.94   Sodium 135 - 145 mmol/L 140  138  140   Potassium 3.5 - 5.1 mmol/L 3.9  3.6  4.1   Chloride 98 - 111 mmol/L 104  100  104   CO2 22 - 32 mmol/L 29  32  33   Calcium 8.9 -  10.3 mg/dL 9.8  89.8  9.7   Total Protein 6.5 - 8.1 g/dL 7.3  7.4  7.5   Total Bilirubin 0.0 - 1.2 mg/dL 1.0  1.3  1.0   Alkaline Phos 38 - 126 U/L 80  91  83   AST 15 - 41 U/L 19  24  42   ALT 0 - 44 U/L 19  30  48       ASSESSMENT & PLAN: Kayla [name] is a 73 y.o. female with      Right breat invasive ductal carcinoma, pT2N0M0, stage IIA, ER+/PR+, HER2-, Oncotype RS 30 -Diagnosed in 02/2015. S/p right breast lumpectomy, adjuvant chemo (TC) and adjuvant radiation.  -She started antiestrogen therapy with letrozole  in 09/2015. Tolerating well with manageable hot flashes and joint stiffness.  Due to her high risk of disease, the recommendation is to continue for a total of 7-10 years. -Genetics testing was negative for pathogenetic mutations.  -Kayla [name] is clinically doing well, exam is benign, labs are normal, recent mammo was benign.  Overall no clinical concern for recurrence. 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 Townsend pt presented with bowel perforation staged pT4N1M1 at diagnosis with pulmonary metastasis  -S/p surgical resection with colostomy 10/27/2008, (later reversed 09/20/2010) -S/p FOLFOX x10 12/10/2008 - 06/02/2009 then went on to receive 5-FU/Luec and bevacizumab from 06/2009 - 12/04/2009 then observation -she tolerated chemo, still residual neuropathy mainly R foot stable on gabapentin  -Subsequently developed right hemiparesthesia 03/2011 found to have a lobulated enhancing mass in the left parietal lobe -CEA elevated to 24.7 on 03/23/2011 -S/p tumor resection by Dr. Unice 03/31/2011 followed by [facility] 04/15/2011 and left craniotomy 11/10/2011 -  She has been doing well on surveillance with no evidence of recurrent disease, last brain MRI 04/29/2016 showed NED -GI Dr. Avram, colonoscopy recall  2028 - No clinical concern for recurrence.  Continue surveillance    PLAN: -Recent mammogram and today's labs reviewed -Continue breast and colon cancer surveillance -Follow-up in 1 year, or sooner if needed    All questions were answered. The patient knows to call the clinic with any problems, questions or concerns. No barriers to learning were detected.   Xylah Early K Tahira Olivarez, NP 04/09/2024

## 2024-04-28 ENCOUNTER — Other Ambulatory Visit: Payer: Self-pay | Admitting: Nurse Practitioner

## 2024-04-28 DIAGNOSIS — C186 Malignant neoplasm of descending colon: Secondary | ICD-10-CM

## 2024-05-03 ENCOUNTER — Other Ambulatory Visit: Payer: Self-pay | Admitting: Nurse Practitioner

## 2024-05-03 DIAGNOSIS — C186 Malignant neoplasm of descending colon: Secondary | ICD-10-CM

## 2024-05-06 ENCOUNTER — Other Ambulatory Visit: Payer: Self-pay | Admitting: Hematology

## 2024-05-06 ENCOUNTER — Other Ambulatory Visit: Payer: Self-pay

## 2024-05-06 ENCOUNTER — Telehealth: Payer: Self-pay

## 2024-05-06 ENCOUNTER — Other Ambulatory Visit: Payer: Self-pay | Admitting: Nurse Practitioner

## 2024-05-06 DIAGNOSIS — C186 Malignant neoplasm of descending colon: Secondary | ICD-10-CM

## 2024-05-06 MED ORDER — GABAPENTIN 300 MG PO CAPS: 300.0000 mg | ORAL_CAPSULE | Freq: Every day | ORAL | 0 refills | Status: AC

## 2024-05-06 NOTE — Telephone Encounter (Signed)
 Pt called stating that she has requested a refill on her Gabapentin  last week and was told that the prescription was sent.  Pt stated she's contacted [facility] several times regarding the prescription and was told they do not have a refill in their system for Gabapentin .  Stated prescription was sent on 04/29/2024 to [facility] pharmacy as requested.  Pt requested if the prescription could be sent again.  Stated this nurse will make Dr. Lanny and her Team aware of the pt's request.

## 2025-04-14 ENCOUNTER — Ambulatory Visit: Admitting: Nurse Practitioner

## 2025-04-14 ENCOUNTER — Other Ambulatory Visit
